# Patient Record
Sex: Female | Born: 1955 | Race: White | Hispanic: No | Marital: Married | Smoking: Former smoker
Health system: Southern US, Community
[De-identification: ages and names within clinical notes are randomized; demographics above are authoritative.]

## PROBLEM LIST (undated history)

## (undated) DIAGNOSIS — L24A9 Irritant contact dermatitis due friction or contact with other specified body fluids: Secondary | ICD-10-CM

## (undated) DIAGNOSIS — D649 Anemia, unspecified: Secondary | ICD-10-CM

## (undated) DIAGNOSIS — Z9189 Other specified personal risk factors, not elsewhere classified: Secondary | ICD-10-CM

## (undated) DIAGNOSIS — T148XXA Other injury of unspecified body region, initial encounter: Secondary | ICD-10-CM

## (undated) DIAGNOSIS — C7951 Secondary malignant neoplasm of bone: Secondary | ICD-10-CM

## (undated) DIAGNOSIS — C50919 Malignant neoplasm of unspecified site of unspecified female breast: Secondary | ICD-10-CM

## (undated) DIAGNOSIS — G4730 Sleep apnea, unspecified: Secondary | ICD-10-CM

## (undated) DIAGNOSIS — I219 Acute myocardial infarction, unspecified: Secondary | ICD-10-CM

## (undated) DIAGNOSIS — I209 Angina pectoris, unspecified: Secondary | ICD-10-CM

## (undated) DIAGNOSIS — Z780 Asymptomatic menopausal state: Secondary | ICD-10-CM

## (undated) DIAGNOSIS — I10 Essential (primary) hypertension: Secondary | ICD-10-CM

## (undated) DIAGNOSIS — I890 Lymphedema, not elsewhere classified: Secondary | ICD-10-CM

## (undated) HISTORY — DX: Other specified personal risk factors, not elsewhere classified: Z91.89

## (undated) HISTORY — DX: Asymptomatic menopausal state: Z78.0

## (undated) HISTORY — DX: Other injury of unspecified body region, initial encounter: T14.8XXA

## (undated) HISTORY — DX: Irritant contact dermatitis due friction or contact with other specified body fluids: L24.A9

## (undated) HISTORY — PX: HERNIA REPAIR: SHX51

## (undated) HISTORY — DX: Essential (primary) hypertension: I10

## (undated) HISTORY — DX: Secondary malignant neoplasm of bone: C79.51

## (undated) HISTORY — DX: Acute myocardial infarction, unspecified: I21.9

## (undated) HISTORY — PX: CORONARY ANGIOPLASTY WITH STENT PLACEMENT: SHX49

## (undated) HISTORY — DX: Lymphedema, not elsewhere classified: I89.0

## (undated) HISTORY — DX: Malignant neoplasm of unspecified site of unspecified female breast: C50.919

---

## 2004-06-27 DIAGNOSIS — Z780 Asymptomatic menopausal state: Secondary | ICD-10-CM

## 2004-06-27 HISTORY — DX: Asymptomatic menopausal state: Z78.0

## 2005-06-27 DIAGNOSIS — Z789 Other specified health status: Secondary | ICD-10-CM

## 2005-06-27 HISTORY — DX: Other specified health status: Z78.9

## 2005-11-08 ENCOUNTER — Other Ambulatory Visit: Payer: Self-pay

## 2005-11-08 ENCOUNTER — Inpatient Hospital Stay: Payer: Self-pay | Admitting: Internal Medicine

## 2006-01-11 ENCOUNTER — Ambulatory Visit: Payer: Self-pay | Admitting: Internal Medicine

## 2006-01-27 ENCOUNTER — Encounter: Payer: Self-pay | Admitting: Internal Medicine

## 2006-02-25 ENCOUNTER — Encounter: Payer: Self-pay | Admitting: Internal Medicine

## 2006-03-15 ENCOUNTER — Ambulatory Visit: Payer: Self-pay | Admitting: Internal Medicine

## 2006-03-27 ENCOUNTER — Encounter: Payer: Self-pay | Admitting: Internal Medicine

## 2006-04-27 ENCOUNTER — Encounter: Payer: Self-pay | Admitting: Internal Medicine

## 2006-05-27 ENCOUNTER — Encounter: Payer: Self-pay | Admitting: Internal Medicine

## 2006-06-27 ENCOUNTER — Encounter: Payer: Self-pay | Admitting: Internal Medicine

## 2013-05-27 ENCOUNTER — Ambulatory Visit: Payer: Self-pay | Admitting: Internal Medicine

## 2013-06-21 ENCOUNTER — Inpatient Hospital Stay: Payer: Self-pay | Admitting: Internal Medicine

## 2013-06-21 LAB — COMPREHENSIVE METABOLIC PANEL
Alkaline Phosphatase: 66 U/L
Anion Gap: 9 (ref 7–16)
BUN: 16 mg/dL (ref 7–18)
Calcium, Total: 8.3 mg/dL — ABNORMAL LOW (ref 8.5–10.1)
Chloride: 99 mmol/L (ref 98–107)
EGFR (African American): 58 — ABNORMAL LOW
EGFR (Non-African Amer.): 50 — ABNORMAL LOW
Glucose: 168 mg/dL — ABNORMAL HIGH (ref 65–99)
Osmolality: 268 (ref 275–301)
SGOT(AST): 39 U/L — ABNORMAL HIGH (ref 15–37)
SGPT (ALT): 22 U/L (ref 12–78)
Sodium: 131 mmol/L — ABNORMAL LOW (ref 136–145)
Total Protein: 7.4 g/dL (ref 6.4–8.2)

## 2013-06-21 LAB — CBC
HCT: 36.4 % (ref 35.0–47.0)
HGB: 11.6 g/dL — ABNORMAL LOW (ref 12.0–16.0)
MCH: 24.4 pg — ABNORMAL LOW (ref 26.0–34.0)
MCHC: 31.9 g/dL — ABNORMAL LOW (ref 32.0–36.0)
RBC: 4.75 10*6/uL (ref 3.80–5.20)

## 2013-06-21 LAB — URINALYSIS, COMPLETE
Glucose,UR: 50 mg/dL (ref 0–75)
Nitrite: NEGATIVE
Ph: 5 (ref 4.5–8.0)
Protein: 100
RBC,UR: 9 /HPF (ref 0–5)
Squamous Epithelial: 5

## 2013-06-21 LAB — TROPONIN I: Troponin-I: <0.02 ng/mL

## 2013-06-21 LAB — CK-MB: CK-MB: <0.5 ng/mL — ABNORMAL LOW (ref 0.5–3.6)

## 2013-06-22 LAB — CBC WITH DIFFERENTIAL/PLATELET
Basophil %: 0.3 %
HGB: 11.8 g/dL — ABNORMAL LOW (ref 12.0–16.0)
Lymphocyte #: 1.3 10*3/uL (ref 1.0–3.6)
MCH: 24.4 pg — ABNORMAL LOW (ref 26.0–34.0)
MCV: 77 fL — ABNORMAL LOW (ref 80–100)
Monocyte %: 6.1 %
Neutrophil #: 12.8 10*3/uL — ABNORMAL HIGH (ref 1.4–6.5)
Neutrophil %: 84.7 %
Platelet: 248 10*3/uL (ref 150–440)
WBC: 15.1 10*3/uL — ABNORMAL HIGH (ref 3.6–11.0)

## 2013-06-22 LAB — BASIC METABOLIC PANEL
Anion Gap: 5 — ABNORMAL LOW (ref 7–16)
Calcium, Total: 8.9 mg/dL (ref 8.5–10.1)
Calcium, Total: 8.3 mg/dL — ABNORMAL LOW (ref 8.5–10.1)
Chloride: 103 mmol/L (ref 98–107)
Co2: 28 mmol/L (ref 21–32)
Creatinine: 0.78 mg/dL (ref 0.60–1.30)
EGFR (Non-African Amer.): >60
Glucose: 120 mg/dL — ABNORMAL HIGH (ref 65–99)
Osmolality: 274 (ref 275–301)
Potassium: 3 mmol/L — ABNORMAL LOW (ref 3.5–5.1)
Sodium: 135 mmol/L — ABNORMAL LOW (ref 136–145)

## 2013-06-23 LAB — CBC WITH DIFFERENTIAL/PLATELET
Basophil #: 0 10*3/uL (ref 0.0–0.1)
Eosinophil %: 2.1 %
HCT: 33.9 % — ABNORMAL LOW (ref 35.0–47.0)
Lymphocyte %: 14.2 %
MCH: 24.4 pg — ABNORMAL LOW (ref 26.0–34.0)
MCHC: 31.9 g/dL — ABNORMAL LOW (ref 32.0–36.0)
Monocyte #: 1 x10 3/mm — ABNORMAL HIGH (ref 0.2–0.9)
Neutrophil #: 9.4 10*3/uL — ABNORMAL HIGH (ref 1.4–6.5)
Neutrophil %: 75.5 %
Platelet: 244 10*3/uL (ref 150–440)
RDW: 18.4 % — ABNORMAL HIGH (ref 11.5–14.5)

## 2013-06-23 LAB — BASIC METABOLIC PANEL
Anion Gap: 6 — ABNORMAL LOW (ref 7–16)
BUN: 7 mg/dL (ref 7–18)
Calcium, Total: 8.4 mg/dL — ABNORMAL LOW (ref 8.5–10.1)
Co2: 27 mmol/L (ref 21–32)
Creatinine: 0.76 mg/dL (ref 0.60–1.30)
Osmolality: 276 (ref 275–301)
Sodium: 138 mmol/L (ref 136–145)

## 2013-06-24 LAB — CBC WITH DIFFERENTIAL/PLATELET
Basophil #: 0 10*3/uL (ref 0.0–0.1)
Basophil %: 0.5 %
Eosinophil #: 0.3 10*3/uL (ref 0.0–0.7)
HCT: 31.1 % — ABNORMAL LOW (ref 35.0–47.0)
Lymphocyte %: 24.7 %
MCH: 24.6 pg — ABNORMAL LOW (ref 26.0–34.0)
MCHC: 32.1 g/dL (ref 32.0–36.0)
MCV: 77 fL — ABNORMAL LOW (ref 80–100)
Monocyte %: 7.8 %
Neutrophil %: 64.1 %
RBC: 4.06 10*6/uL (ref 3.80–5.20)
WBC: 9.2 10*3/uL (ref 3.6–11.0)

## 2013-06-24 LAB — BASIC METABOLIC PANEL
Anion Gap: 6 — ABNORMAL LOW (ref 7–16)
BUN: 5 mg/dL — ABNORMAL LOW (ref 7–18)
Calcium, Total: 7.9 mg/dL — ABNORMAL LOW (ref 8.5–10.1)
Chloride: 106 mmol/L (ref 98–107)
Creatinine: 0.77 mg/dL (ref 0.60–1.30)
EGFR (African American): >60
Potassium: 3.1 mmol/L — ABNORMAL LOW (ref 3.5–5.1)

## 2013-06-24 LAB — VANCOMYCIN, TROUGH: Vancomycin, Trough: 8 ug/mL — ABNORMAL LOW (ref 10–20)

## 2013-06-25 LAB — BASIC METABOLIC PANEL
BUN: 5 mg/dL — ABNORMAL LOW (ref 7–18)
Chloride: 108 mmol/L — ABNORMAL HIGH (ref 98–107)
Co2: 28 mmol/L (ref 21–32)
Creatinine: 0.66 mg/dL (ref 0.60–1.30)
EGFR (Non-African Amer.): >60
Osmolality: 278 (ref 275–301)
Potassium: 3.4 mmol/L — ABNORMAL LOW (ref 3.5–5.1)
Sodium: 140 mmol/L (ref 136–145)

## 2013-06-26 LAB — CBC WITH DIFFERENTIAL/PLATELET
Basophil #: 0.1 10*3/uL (ref 0.0–0.1)
Basophil %: 0.6 %
Eosinophil #: 0.3 10*3/uL (ref 0.0–0.7)
Eosinophil %: 2.9 %
HCT: 29.3 % — ABNORMAL LOW (ref 35.0–47.0)
HGB: 9.1 g/dL — ABNORMAL LOW (ref 12.0–16.0)
Lymphocyte #: 1.6 10*3/uL (ref 1.0–3.6)
Lymphocyte %: 14.8 %
MCH: 23.9 pg — ABNORMAL LOW (ref 26.0–34.0)
MCHC: 31.1 g/dL — ABNORMAL LOW (ref 32.0–36.0)
MCV: 77 fL — ABNORMAL LOW (ref 80–100)
Monocyte #: 0.6 x10 3/mm (ref 0.2–0.9)
Monocyte %: 5.7 %
Neutrophil #: 8.4 10*3/uL — ABNORMAL HIGH (ref 1.4–6.5)
Neutrophil %: 76 %
Platelet: 250 10*3/uL (ref 150–440)
RBC: 3.82 10*6/uL (ref 3.80–5.20)
RDW: 18.1 % — ABNORMAL HIGH (ref 11.5–14.5)
WBC: 11 10*3/uL (ref 3.6–11.0)

## 2013-06-26 LAB — BASIC METABOLIC PANEL
BUN: 7 mg/dL (ref 7–18)
Calcium, Total: 8.2 mg/dL — ABNORMAL LOW (ref 8.5–10.1)
Co2: 28 mmol/L (ref 21–32)
Creatinine: 0.74 mg/dL (ref 0.60–1.30)
EGFR (African American): >60
EGFR (Non-African Amer.): >60
Glucose: 117 mg/dL — ABNORMAL HIGH (ref 65–99)
Osmolality: 290 (ref 275–301)
Sodium: 146 mmol/L — ABNORMAL HIGH (ref 136–145)

## 2013-06-26 LAB — CULTURE, BLOOD (SINGLE)

## 2013-06-28 LAB — COMPREHENSIVE METABOLIC PANEL
Albumin: 2.1 g/dL — ABNORMAL LOW (ref 3.4–5.0)
Alkaline Phosphatase: 43 U/L — ABNORMAL LOW
Anion Gap: 6 — ABNORMAL LOW (ref 7–16)
BILIRUBIN TOTAL: 0.3 mg/dL (ref 0.2–1.0)
BUN: 6 mg/dL — ABNORMAL LOW (ref 7–18)
CHLORIDE: 111 mmol/L — AB (ref 98–107)
CREATININE: 0.8 mg/dL (ref 0.60–1.30)
Calcium, Total: 8.3 mg/dL — ABNORMAL LOW (ref 8.5–10.1)
Co2: 27 mmol/L (ref 21–32)
EGFR (African American): >60
GLUCOSE: 118 mg/dL — AB (ref 65–99)
Osmolality: 286 (ref 275–301)
POTASSIUM: 3.8 mmol/L (ref 3.5–5.1)
SGOT(AST): 21 U/L (ref 15–37)
SGPT (ALT): 23 U/L (ref 12–78)
Sodium: 144 mmol/L (ref 136–145)
Total Protein: 6.4 g/dL (ref 6.4–8.2)

## 2013-06-28 LAB — CBC WITH DIFFERENTIAL/PLATELET
BASOS PCT: 0.6 %
Basophil #: 0.1 10*3/uL (ref 0.0–0.1)
EOS ABS: 0.3 10*3/uL (ref 0.0–0.7)
Eosinophil %: 3.4 %
HCT: 29.6 % — ABNORMAL LOW (ref 35.0–47.0)
HGB: 9.3 g/dL — AB (ref 12.0–16.0)
Lymphocyte #: 1.4 10*3/uL (ref 1.0–3.6)
Lymphocyte %: 14.4 %
MCH: 24.3 pg — ABNORMAL LOW (ref 26.0–34.0)
MCHC: 31.6 g/dL — AB (ref 32.0–36.0)
MCV: 77 fL — AB (ref 80–100)
Monocyte #: 0.7 x10 3/mm (ref 0.2–0.9)
Monocyte %: 7.2 %
Neutrophil #: 7.2 10*3/uL — ABNORMAL HIGH (ref 1.4–6.5)
Neutrophil %: 74.4 %
PLATELETS: 285 10*3/uL (ref 150–440)
RBC: 3.85 10*6/uL (ref 3.80–5.20)
RDW: 18.9 % — AB (ref 11.5–14.5)
WBC: 9.7 10*3/uL (ref 3.6–11.0)

## 2013-06-28 LAB — VANCOMYCIN, TROUGH: Vancomycin, Trough: 22 ug/mL (ref 10–20)

## 2013-07-04 LAB — PATHOLOGY REPORT

## 2013-07-08 ENCOUNTER — Ambulatory Visit: Payer: Self-pay | Admitting: Internal Medicine

## 2013-07-08 LAB — CBC CANCER CENTER
BASOS ABS: 0.1 x10 3/mm (ref 0.0–0.1)
BASOS PCT: 1.4 %
EOS PCT: 1.3 %
Eosinophil #: 0.1 x10 3/mm (ref 0.0–0.7)
HCT: 35.5 % (ref 35.0–47.0)
HGB: 11.3 g/dL — AB (ref 12.0–16.0)
Lymphocyte #: 1.3 x10 3/mm (ref 1.0–3.6)
Lymphocyte %: 13.2 %
MCH: 24.5 pg — ABNORMAL LOW (ref 26.0–34.0)
MCHC: 32 g/dL (ref 32.0–36.0)
MCV: 76 fL — ABNORMAL LOW (ref 80–100)
MONOS PCT: 5.9 %
Monocyte #: 0.6 x10 3/mm (ref 0.2–0.9)
NEUTROS ABS: 7.6 x10 3/mm — AB (ref 1.4–6.5)
Neutrophil %: 78.2 %
Platelet: 416 x10 3/mm (ref 150–440)
RBC: 4.64 10*6/uL (ref 3.80–5.20)
RDW: 19.1 % — AB (ref 11.5–14.5)
WBC: 9.7 x10 3/mm (ref 3.6–11.0)

## 2013-07-08 LAB — IRON AND TIBC
IRON: 26 ug/dL — AB (ref 50–170)
Iron Bind.Cap.(Total): 306 ug/dL (ref 250–450)
Iron Saturation: 8 %
Unbound Iron-Bind.Cap.: 280 ug/dL

## 2013-07-08 LAB — FERRITIN: Ferritin (ARMC): 53 ng/mL (ref 8–388)

## 2013-07-09 LAB — CEA: CEA: 124.1 ng/mL — AB (ref 0.0–4.7)

## 2013-07-09 LAB — CANCER ANTIGEN 27.29: CA 27.29: 50.9 U/mL — AB (ref 0.0–38.6)

## 2013-07-28 ENCOUNTER — Ambulatory Visit: Payer: Self-pay | Admitting: Internal Medicine

## 2013-08-05 ENCOUNTER — Encounter: Payer: Self-pay | Admitting: Surgery

## 2013-08-25 ENCOUNTER — Encounter: Payer: Self-pay | Admitting: Surgery

## 2013-08-25 ENCOUNTER — Ambulatory Visit: Payer: Self-pay | Admitting: Internal Medicine

## 2013-09-04 LAB — CALCIUM: Calcium, Total: 9.4 mg/dL (ref 8.5–10.1)

## 2013-09-04 LAB — CREATININE, SERUM: CREATININE: 0.85 mg/dL (ref 0.60–1.30)

## 2013-09-04 LAB — PHOSPHORUS: Phosphorus: 2.8 mg/dL (ref 2.5–4.9)

## 2013-09-05 LAB — CEA: CEA: 72.4 ng/mL — AB (ref 0.0–4.7)

## 2013-09-25 ENCOUNTER — Ambulatory Visit: Payer: Self-pay | Admitting: Internal Medicine

## 2013-09-25 ENCOUNTER — Encounter: Payer: Self-pay | Admitting: Surgery

## 2013-10-02 LAB — CALCIUM: Calcium, Total: 8.8 mg/dL (ref 8.5–10.1)

## 2013-10-02 LAB — PHOSPHORUS: PHOSPHORUS: 1.9 mg/dL — AB (ref 2.5–4.9)

## 2013-10-11 LAB — COMPREHENSIVE METABOLIC PANEL
ALBUMIN: 3.3 g/dL — AB (ref 3.4–5.0)
ALT: 23 U/L (ref 12–78)
AST: 12 U/L — AB (ref 15–37)
Alkaline Phosphatase: 67 U/L
Anion Gap: 12 (ref 7–16)
BUN: 14 mg/dL (ref 7–18)
Bilirubin,Total: 0.2 mg/dL (ref 0.2–1.0)
CREATININE: 0.8 mg/dL (ref 0.60–1.30)
Calcium, Total: 8.9 mg/dL (ref 8.5–10.1)
Chloride: 105 mmol/L (ref 98–107)
Co2: 26 mmol/L (ref 21–32)
EGFR (African American): >60
EGFR (Non-African Amer.): >60
Glucose: 133 mg/dL — ABNORMAL HIGH (ref 65–99)
Osmolality: 287 (ref 275–301)
Potassium: 3.9 mmol/L (ref 3.5–5.1)
SODIUM: 143 mmol/L (ref 136–145)
Total Protein: 8.4 g/dL — ABNORMAL HIGH (ref 6.4–8.2)

## 2013-10-11 LAB — PROTIME-INR
INR: 1
Prothrombin Time: 12.6 secs (ref 11.5–14.7)

## 2013-10-11 LAB — CBC CANCER CENTER
BASOS ABS: 0.1 x10 3/mm (ref 0.0–0.1)
Basophil %: 1 %
EOS PCT: 3.2 %
Eosinophil #: 0.3 x10 3/mm (ref 0.0–0.7)
HCT: 42.2 % (ref 35.0–47.0)
HGB: 12.9 g/dL (ref 12.0–16.0)
LYMPHS ABS: 1.6 x10 3/mm (ref 1.0–3.6)
Lymphocyte %: 18.8 %
MCH: 23.6 pg — AB (ref 26.0–34.0)
MCHC: 30.6 g/dL — ABNORMAL LOW (ref 32.0–36.0)
MCV: 77 fL — AB (ref 80–100)
MONOS PCT: 5.7 %
Monocyte #: 0.5 x10 3/mm (ref 0.2–0.9)
Neutrophil #: 6.1 x10 3/mm (ref 1.4–6.5)
Neutrophil %: 71.3 %
PLATELETS: 285 x10 3/mm (ref 150–440)
RBC: 5.46 10*6/uL — ABNORMAL HIGH (ref 3.80–5.20)
RDW: 18.7 % — ABNORMAL HIGH (ref 11.5–14.5)
WBC: 8.6 x10 3/mm (ref 3.6–11.0)

## 2013-10-11 LAB — MAGNESIUM: Magnesium: 2.1 mg/dL

## 2013-10-14 LAB — CEA: CEA: 19.1 ng/mL — ABNORMAL HIGH (ref 0.0–4.7)

## 2013-10-17 LAB — CBC CANCER CENTER
BASOS PCT: 0.1 %
Basophil #: 0 x10 3/mm (ref 0.0–0.1)
EOS ABS: 0.4 x10 3/mm (ref 0.0–0.7)
Eosinophil %: 5.7 %
HCT: 39.7 % (ref 35.0–47.0)
HGB: 12.3 g/dL (ref 12.0–16.0)
LYMPHS ABS: 1.9 x10 3/mm (ref 1.0–3.6)
LYMPHS PCT: 24.6 %
MCH: 24.1 pg — ABNORMAL LOW (ref 26.0–34.0)
MCHC: 31.1 g/dL — ABNORMAL LOW (ref 32.0–36.0)
MCV: 78 fL — ABNORMAL LOW (ref 80–100)
MONO ABS: 0.5 x10 3/mm (ref 0.2–0.9)
Monocyte %: 6.2 %
Neutrophil #: 4.9 x10 3/mm (ref 1.4–6.5)
Neutrophil %: 63.4 %
Platelet: 278 x10 3/mm (ref 150–440)
RBC: 5.11 10*6/uL (ref 3.80–5.20)
RDW: 19.3 % — ABNORMAL HIGH (ref 11.5–14.5)
WBC: 7.8 x10 3/mm (ref 3.6–11.0)

## 2013-10-24 LAB — CBC CANCER CENTER
BASOS ABS: 0.1 x10 3/mm (ref 0.0–0.1)
Basophil %: 1.3 %
EOS PCT: 3.4 %
Eosinophil #: 0.2 x10 3/mm (ref 0.0–0.7)
HCT: 38.4 % (ref 35.0–47.0)
HGB: 12.4 g/dL (ref 12.0–16.0)
LYMPHS ABS: 1.5 x10 3/mm (ref 1.0–3.6)
Lymphocyte %: 27.7 %
MCH: 24.8 pg — ABNORMAL LOW (ref 26.0–34.0)
MCHC: 32.2 g/dL (ref 32.0–36.0)
MCV: 77 fL — ABNORMAL LOW (ref 80–100)
Monocyte #: 0.2 x10 3/mm (ref 0.2–0.9)
Monocyte %: 4.3 %
NEUTROS ABS: 3.5 x10 3/mm (ref 1.4–6.5)
NEUTROS PCT: 63.3 %
Platelet: 291 x10 3/mm (ref 150–440)
RBC: 5 10*6/uL (ref 3.80–5.20)
RDW: 19.4 % — ABNORMAL HIGH (ref 11.5–14.5)
WBC: 5.6 x10 3/mm (ref 3.6–11.0)

## 2013-10-25 ENCOUNTER — Ambulatory Visit: Payer: Self-pay | Admitting: Internal Medicine

## 2013-10-25 ENCOUNTER — Encounter: Payer: Self-pay | Admitting: Surgery

## 2013-10-31 LAB — CBC CANCER CENTER
BASOS ABS: 0.1 x10 3/mm (ref 0.0–0.1)
BASOS PCT: 1.8 %
EOS ABS: 0.1 x10 3/mm (ref 0.0–0.7)
EOS PCT: 3 %
HCT: 38.3 % (ref 35.0–47.0)
HGB: 12.1 g/dL (ref 12.0–16.0)
Lymphocyte #: 1.6 x10 3/mm (ref 1.0–3.6)
Lymphocyte %: 37.4 %
MCH: 25.2 pg — ABNORMAL LOW (ref 26.0–34.0)
MCHC: 31.5 g/dL — AB (ref 32.0–36.0)
MCV: 80 fL (ref 80–100)
Monocyte #: 0.2 x10 3/mm (ref 0.2–0.9)
Monocyte %: 5.5 %
NEUTROS ABS: 2.2 x10 3/mm (ref 1.4–6.5)
NEUTROS PCT: 52.3 %
Platelet: 224 x10 3/mm (ref 150–440)
RBC: 4.8 10*6/uL (ref 3.80–5.20)
RDW: 20 % — ABNORMAL HIGH (ref 11.5–14.5)
WBC: 4.3 x10 3/mm (ref 3.6–11.0)

## 2013-11-07 LAB — CBC CANCER CENTER
BASOS PCT: 1.4 %
Basophil #: 0.1 x10 3/mm (ref 0.0–0.1)
EOS ABS: 0.1 x10 3/mm (ref 0.0–0.7)
EOS PCT: 2.2 %
HCT: 37 % (ref 35.0–47.0)
HGB: 12 g/dL (ref 12.0–16.0)
LYMPHS ABS: 1.4 x10 3/mm (ref 1.0–3.6)
Lymphocyte %: 30.7 %
MCH: 25.9 pg — ABNORMAL LOW (ref 26.0–34.0)
MCHC: 32.5 g/dL (ref 32.0–36.0)
MCV: 80 fL (ref 80–100)
MONOS PCT: 9.2 %
Monocyte #: 0.4 x10 3/mm (ref 0.2–0.9)
Neutrophil #: 2.7 x10 3/mm (ref 1.4–6.5)
Neutrophil %: 56.5 %
PLATELETS: 205 x10 3/mm (ref 150–440)
RBC: 4.65 10*6/uL (ref 3.80–5.20)
RDW: 22.5 % — ABNORMAL HIGH (ref 11.5–14.5)
WBC: 4.7 x10 3/mm (ref 3.6–11.0)

## 2013-11-07 LAB — CREATININE, SERUM
Creatinine: 0.73 mg/dL (ref 0.60–1.30)
EGFR (African American): >60

## 2013-11-07 LAB — PHOSPHORUS: Phosphorus: 3 mg/dL (ref 2.5–4.9)

## 2013-11-07 LAB — CALCIUM: CALCIUM: 8.6 mg/dL (ref 8.5–10.1)

## 2013-11-08 LAB — CEA: CEA: 10.9 ng/mL — AB (ref 0.0–4.7)

## 2013-11-14 LAB — COMPREHENSIVE METABOLIC PANEL
ANION GAP: 10 (ref 7–16)
AST: 27 U/L (ref 15–37)
Albumin: 3 g/dL — ABNORMAL LOW (ref 3.4–5.0)
Alkaline Phosphatase: 67 U/L
BILIRUBIN TOTAL: 0.2 mg/dL (ref 0.2–1.0)
BUN: 14 mg/dL (ref 7–18)
CREATININE: 0.9 mg/dL (ref 0.60–1.30)
Calcium, Total: 8.2 mg/dL — ABNORMAL LOW (ref 8.5–10.1)
Chloride: 107 mmol/L (ref 98–107)
Co2: 27 mmol/L (ref 21–32)
EGFR (Non-African Amer.): >60
Glucose: 140 mg/dL — ABNORMAL HIGH (ref 65–99)
Osmolality: 290 (ref 275–301)
Potassium: 3.9 mmol/L (ref 3.5–5.1)
SGPT (ALT): 45 U/L (ref 12–78)
Sodium: 144 mmol/L (ref 136–145)
Total Protein: 7.4 g/dL (ref 6.4–8.2)

## 2013-11-14 LAB — CBC CANCER CENTER
Basophil #: 0.1 x10 3/mm (ref 0.0–0.1)
Basophil %: 2.3 %
Eosinophil #: 0.1 x10 3/mm (ref 0.0–0.7)
Eosinophil %: 2.3 %
HCT: 37.4 % (ref 35.0–47.0)
HGB: 12.1 g/dL (ref 12.0–16.0)
LYMPHS ABS: 1.5 x10 3/mm (ref 1.0–3.6)
Lymphocyte %: 24.1 %
MCH: 25.8 pg — AB (ref 26.0–34.0)
MCHC: 32.3 g/dL (ref 32.0–36.0)
MCV: 80 fL (ref 80–100)
Monocyte #: 0.4 x10 3/mm (ref 0.2–0.9)
Monocyte %: 5.8 %
NEUTROS ABS: 3.9 x10 3/mm (ref 1.4–6.5)
Neutrophil %: 65.5 %
Platelet: 281 x10 3/mm (ref 150–440)
RBC: 4.68 10*6/uL (ref 3.80–5.20)
RDW: 23.7 % — ABNORMAL HIGH (ref 11.5–14.5)
WBC: 6 x10 3/mm (ref 3.6–11.0)

## 2013-11-21 LAB — CBC CANCER CENTER
Basophil #: 0.2 x10 3/mm — ABNORMAL HIGH (ref 0.0–0.1)
Basophil %: 2.6 %
Eosinophil #: 0.2 x10 3/mm (ref 0.0–0.7)
Eosinophil %: 3.4 %
HCT: 38.2 % (ref 35.0–47.0)
HGB: 12.4 g/dL (ref 12.0–16.0)
Lymphocyte #: 1.6 x10 3/mm (ref 1.0–3.6)
Lymphocyte %: 27 %
MCH: 26.9 pg (ref 26.0–34.0)
MCHC: 32.5 g/dL (ref 32.0–36.0)
MCV: 83 fL (ref 80–100)
MONO ABS: 0.4 x10 3/mm (ref 0.2–0.9)
MONOS PCT: 6.1 %
NEUTROS ABS: 3.5 x10 3/mm (ref 1.4–6.5)
Neutrophil %: 60.9 %
PLATELETS: 367 x10 3/mm (ref 150–440)
RBC: 4.61 10*6/uL (ref 3.80–5.20)
RDW: 24.4 % — ABNORMAL HIGH (ref 11.5–14.5)
WBC: 5.8 x10 3/mm (ref 3.6–11.0)

## 2013-11-25 ENCOUNTER — Ambulatory Visit: Payer: Self-pay | Admitting: Internal Medicine

## 2013-11-25 ENCOUNTER — Encounter: Payer: Self-pay | Admitting: Surgery

## 2013-11-28 LAB — CBC CANCER CENTER
Basophil #: 0.1 x10 3/mm (ref 0.0–0.1)
Basophil %: 1.8 %
EOS ABS: 0.2 x10 3/mm (ref 0.0–0.7)
Eosinophil %: 3.5 %
HCT: 39.8 % (ref 35.0–47.0)
HGB: 12.7 g/dL (ref 12.0–16.0)
LYMPHS PCT: 31.2 %
Lymphocyte #: 1.9 x10 3/mm (ref 1.0–3.6)
MCH: 26.8 pg (ref 26.0–34.0)
MCHC: 31.8 g/dL — ABNORMAL LOW (ref 32.0–36.0)
MCV: 84 fL (ref 80–100)
MONO ABS: 0.3 x10 3/mm (ref 0.2–0.9)
MONOS PCT: 4.5 %
NEUTROS PCT: 59 %
Neutrophil #: 3.6 x10 3/mm (ref 1.4–6.5)
PLATELETS: 291 x10 3/mm (ref 150–440)
RBC: 4.72 10*6/uL (ref 3.80–5.20)
RDW: 24.7 % — ABNORMAL HIGH (ref 11.5–14.5)
WBC: 6.2 x10 3/mm (ref 3.6–11.0)

## 2013-12-05 LAB — CBC CANCER CENTER
BASOS ABS: 0.2 x10 3/mm — AB (ref 0.0–0.1)
BASOS PCT: 2.5 %
EOS PCT: 3 %
Eosinophil #: 0.2 x10 3/mm (ref 0.0–0.7)
HCT: 41 % (ref 35.0–47.0)
HGB: 13.1 g/dL (ref 12.0–16.0)
LYMPHS ABS: 2.1 x10 3/mm (ref 1.0–3.6)
Lymphocyte %: 32.1 %
MCH: 27.4 pg (ref 26.0–34.0)
MCHC: 32 g/dL (ref 32.0–36.0)
MCV: 85 fL (ref 80–100)
Monocyte #: 0.7 x10 3/mm (ref 0.2–0.9)
Monocyte %: 10.3 %
NEUTROS ABS: 3.3 x10 3/mm (ref 1.4–6.5)
Neutrophil %: 52.1 %
Platelet: 222 x10 3/mm (ref 150–440)
RBC: 4.79 10*6/uL (ref 3.80–5.20)
RDW: 25.6 % — ABNORMAL HIGH (ref 11.5–14.5)
WBC: 6.4 x10 3/mm (ref 3.6–11.0)

## 2013-12-05 LAB — PHOSPHORUS: PHOSPHORUS: 4.2 mg/dL (ref 2.5–4.9)

## 2013-12-05 LAB — CREATININE, SERUM: CREATININE: 0.91 mg/dL (ref 0.60–1.30)

## 2013-12-05 LAB — CALCIUM: Calcium, Total: 9.8 mg/dL (ref 8.5–10.1)

## 2013-12-06 LAB — CEA: CEA: 9.2 ng/mL — ABNORMAL HIGH (ref 0.0–4.7)

## 2013-12-19 LAB — HEPATIC FUNCTION PANEL A (ARMC)
ALBUMIN: 3.1 g/dL — AB (ref 3.4–5.0)
ALK PHOS: 67 U/L
ALT: 177 U/L — AB (ref 12–78)
AST: 65 U/L — AB (ref 15–37)
BILIRUBIN TOTAL: 0.2 mg/dL (ref 0.2–1.0)
Total Protein: 7.9 g/dL (ref 6.4–8.2)

## 2013-12-19 LAB — CREATININE, SERUM
CREATININE: 0.91 mg/dL (ref 0.60–1.30)
EGFR (African American): >60

## 2013-12-19 LAB — CBC CANCER CENTER
Basophil #: 0.1 x10 3/mm (ref 0.0–0.1)
Basophil %: 2.3 %
EOS ABS: 0.3 x10 3/mm (ref 0.0–0.7)
Eosinophil %: 4.6 %
HCT: 41.1 % (ref 35.0–47.0)
HGB: 13 g/dL (ref 12.0–16.0)
Lymphocyte #: 1.8 x10 3/mm (ref 1.0–3.6)
Lymphocyte %: 30.7 %
MCH: 27.7 pg (ref 26.0–34.0)
MCHC: 31.6 g/dL — ABNORMAL LOW (ref 32.0–36.0)
MCV: 88 fL (ref 80–100)
MONO ABS: 0.4 x10 3/mm (ref 0.2–0.9)
Monocyte %: 7 %
NEUTROS ABS: 3.3 x10 3/mm (ref 1.4–6.5)
NEUTROS PCT: 55.4 %
PLATELETS: 370 x10 3/mm (ref 150–440)
RBC: 4.68 10*6/uL (ref 3.80–5.20)
RDW: 25.3 % — ABNORMAL HIGH (ref 11.5–14.5)
WBC: 6 x10 3/mm (ref 3.6–11.0)

## 2013-12-25 ENCOUNTER — Ambulatory Visit: Payer: Self-pay | Admitting: Internal Medicine

## 2014-01-02 LAB — CBC CANCER CENTER
BASOS PCT: 2.2 %
Basophil #: 0.1 x10 3/mm (ref 0.0–0.1)
Eosinophil #: 0.2 x10 3/mm (ref 0.0–0.7)
Eosinophil %: 2.9 %
HCT: 41.7 % (ref 35.0–47.0)
HGB: 13.5 g/dL (ref 12.0–16.0)
LYMPHS ABS: 1.7 x10 3/mm (ref 1.0–3.6)
LYMPHS PCT: 27.8 %
MCH: 29.5 pg (ref 26.0–34.0)
MCHC: 32.5 g/dL (ref 32.0–36.0)
MCV: 91 fL (ref 80–100)
Monocyte #: 0.5 x10 3/mm (ref 0.2–0.9)
Monocyte %: 8.7 %
NEUTROS ABS: 3.6 x10 3/mm (ref 1.4–6.5)
Neutrophil %: 58.4 %
Platelet: 214 x10 3/mm (ref 150–440)
RBC: 4.59 10*6/uL (ref 3.80–5.20)
RDW: 24 % — ABNORMAL HIGH (ref 11.5–14.5)
WBC: 6.1 x10 3/mm (ref 3.6–11.0)

## 2014-01-02 LAB — PHOSPHORUS: Phosphorus: 3.3 mg/dL (ref 2.5–4.9)

## 2014-01-02 LAB — CREATININE, SERUM
Creatinine: 0.85 mg/dL (ref 0.60–1.30)
EGFR (African American): >60
EGFR (Non-African Amer.): >60

## 2014-01-02 LAB — HEPATIC FUNCTION PANEL A (ARMC)
AST: 51 U/L — AB (ref 15–37)
Albumin: 3.4 g/dL (ref 3.4–5.0)
Alkaline Phosphatase: 60 U/L
Bilirubin,Total: 0.3 mg/dL (ref 0.2–1.0)
SGPT (ALT): 110 U/L — ABNORMAL HIGH (ref 12–78)
Total Protein: 8.1 g/dL (ref 6.4–8.2)

## 2014-01-02 LAB — CALCIUM: Calcium, Total: 9.3 mg/dL (ref 8.5–10.1)

## 2014-01-09 LAB — HEPATIC FUNCTION PANEL A (ARMC)
Albumin: 3.3 g/dL — ABNORMAL LOW (ref 3.4–5.0)
Alkaline Phosphatase: 55 U/L
Bilirubin, Direct: <0.1 mg/dL (ref 0.00–0.20)
Bilirubin,Total: 0.3 mg/dL (ref 0.2–1.0)
SGOT(AST): 56 U/L — ABNORMAL HIGH (ref 15–37)
SGPT (ALT): 104 U/L — ABNORMAL HIGH (ref 12–78)
TOTAL PROTEIN: 7.9 g/dL (ref 6.4–8.2)

## 2014-01-16 LAB — HEPATIC FUNCTION PANEL A (ARMC)
ALT: 66 U/L — AB
AST: 32 U/L (ref 15–37)
Albumin: 3.4 g/dL (ref 3.4–5.0)
Alkaline Phosphatase: 61 U/L
Bilirubin,Total: 0.2 mg/dL (ref 0.2–1.0)
TOTAL PROTEIN: 8 g/dL (ref 6.4–8.2)

## 2014-01-25 ENCOUNTER — Ambulatory Visit: Payer: Self-pay | Admitting: Internal Medicine

## 2014-01-30 LAB — HEPATIC FUNCTION PANEL A (ARMC)
ALBUMIN: 3.4 g/dL (ref 3.4–5.0)
Alkaline Phosphatase: 58 U/L
Bilirubin, Direct: <0.1 mg/dL (ref 0.00–0.20)
Bilirubin,Total: 0.3 mg/dL (ref 0.2–1.0)
SGOT(AST): 20 U/L (ref 15–37)
SGPT (ALT): 38 U/L
TOTAL PROTEIN: 7.9 g/dL (ref 6.4–8.2)

## 2014-01-30 LAB — CBC CANCER CENTER
BASOS ABS: 0 x10 3/mm (ref 0.0–0.1)
BASOS PCT: 0.4 %
EOS ABS: 0.4 x10 3/mm (ref 0.0–0.7)
Eosinophil %: 3.6 %
HCT: 42.5 % (ref 35.0–47.0)
HGB: 13.8 g/dL (ref 12.0–16.0)
Lymphocyte #: 2.3 x10 3/mm (ref 1.0–3.6)
Lymphocyte %: 21.3 %
MCH: 29.5 pg (ref 26.0–34.0)
MCHC: 32.5 g/dL (ref 32.0–36.0)
MCV: 91 fL (ref 80–100)
MONOS PCT: 7.1 %
Monocyte #: 0.8 x10 3/mm (ref 0.2–0.9)
NEUTROS ABS: 7.4 x10 3/mm — AB (ref 1.4–6.5)
Neutrophil %: 67.6 %
Platelet: 275 x10 3/mm (ref 150–440)
RBC: 4.68 10*6/uL (ref 3.80–5.20)
RDW: 19.4 % — ABNORMAL HIGH (ref 11.5–14.5)
WBC: 11 x10 3/mm (ref 3.6–11.0)

## 2014-02-01 LAB — CEA: CEA: 15.4 ng/mL — ABNORMAL HIGH (ref 0.0–4.7)

## 2014-02-12 LAB — CBC CANCER CENTER
Basophil #: 0.1 x10 3/mm (ref 0.0–0.1)
Basophil %: 1.3 %
Eosinophil #: 0.2 x10 3/mm (ref 0.0–0.7)
Eosinophil %: 3.6 %
HCT: 41.5 % (ref 35.0–47.0)
HGB: 13.3 g/dL (ref 12.0–16.0)
Lymphocyte #: 1.4 x10 3/mm (ref 1.0–3.6)
Lymphocyte %: 23.5 %
MCH: 29.4 pg (ref 26.0–34.0)
MCHC: 32.2 g/dL (ref 32.0–36.0)
MCV: 91 fL (ref 80–100)
MONOS PCT: 4.5 %
Monocyte #: 0.3 x10 3/mm (ref 0.2–0.9)
NEUTROS ABS: 4 x10 3/mm (ref 1.4–6.5)
Neutrophil %: 67.1 %
PLATELETS: 232 x10 3/mm (ref 150–440)
RBC: 4.54 10*6/uL (ref 3.80–5.20)
RDW: 17.9 % — ABNORMAL HIGH (ref 11.5–14.5)
WBC: 5.9 x10 3/mm (ref 3.6–11.0)

## 2014-02-12 LAB — HEPATIC FUNCTION PANEL A (ARMC)
ALK PHOS: 59 U/L
ALT: 92 U/L — AB
Albumin: 3 g/dL — ABNORMAL LOW (ref 3.4–5.0)
Bilirubin,Total: 0.3 mg/dL (ref 0.2–1.0)
SGOT(AST): 34 U/L (ref 15–37)
TOTAL PROTEIN: 7.5 g/dL (ref 6.4–8.2)

## 2014-02-18 LAB — HEPATIC FUNCTION PANEL A (ARMC)
ALT: 49 U/L
AST: 26 U/L (ref 15–37)
Albumin: 3 g/dL — ABNORMAL LOW (ref 3.4–5.0)
Alkaline Phosphatase: 60 U/L
Bilirubin, Direct: <0.1 mg/dL (ref 0.00–0.20)
Bilirubin,Total: 0.2 mg/dL (ref 0.2–1.0)
Total Protein: 7.5 g/dL (ref 6.4–8.2)

## 2014-02-25 ENCOUNTER — Ambulatory Visit: Payer: Self-pay | Admitting: Internal Medicine

## 2014-02-26 LAB — CBC CANCER CENTER
BASOS ABS: 0.1 x10 3/mm (ref 0.0–0.1)
Basophil %: 2.1 %
Eosinophil #: 0.1 x10 3/mm (ref 0.0–0.7)
Eosinophil %: 1.8 %
HCT: 43.7 % (ref 35.0–47.0)
HGB: 14 g/dL (ref 12.0–16.0)
Lymphocyte #: 1.4 x10 3/mm (ref 1.0–3.6)
Lymphocyte %: 35.5 %
MCH: 30 pg (ref 26.0–34.0)
MCHC: 32.1 g/dL (ref 32.0–36.0)
MCV: 93 fL (ref 80–100)
Monocyte #: 0.3 x10 3/mm (ref 0.2–0.9)
Monocyte %: 7.1 %
NEUTROS ABS: 2.2 x10 3/mm (ref 1.4–6.5)
NEUTROS PCT: 53.5 %
Platelet: 225 x10 3/mm (ref 150–440)
RBC: 4.68 10*6/uL (ref 3.80–5.20)
RDW: 18.3 % — ABNORMAL HIGH (ref 11.5–14.5)
WBC: 4 x10 3/mm (ref 3.6–11.0)

## 2014-02-26 LAB — HEPATIC FUNCTION PANEL A (ARMC)
ALBUMIN: 3.3 g/dL — AB (ref 3.4–5.0)
ALT: 40 U/L
Alkaline Phosphatase: 58 U/L
Bilirubin,Total: 0.4 mg/dL (ref 0.2–1.0)
SGOT(AST): 23 U/L (ref 15–37)
Total Protein: 7.8 g/dL (ref 6.4–8.2)

## 2014-02-26 LAB — CREATININE, SERUM
Creatinine: 0.97 mg/dL (ref 0.60–1.30)
EGFR (African American): >60
EGFR (Non-African Amer.): >60

## 2014-02-27 LAB — CEA: CEA: 31.4 ng/mL — ABNORMAL HIGH (ref 0.0–4.7)

## 2014-03-17 LAB — HEPATIC FUNCTION PANEL A (ARMC)
ALK PHOS: 57 U/L
Albumin: 3.4 g/dL (ref 3.4–5.0)
Bilirubin, Direct: <0.1 mg/dL (ref 0.00–0.20)
Bilirubin,Total: 0.4 mg/dL (ref 0.2–1.0)
SGOT(AST): 20 U/L (ref 15–37)
SGPT (ALT): 30 U/L
Total Protein: 7.8 g/dL (ref 6.4–8.2)

## 2014-03-17 LAB — CALCIUM: Calcium, Total: 8.6 mg/dL (ref 8.5–10.1)

## 2014-03-17 LAB — PHOSPHORUS: Phosphorus: 2.3 mg/dL — ABNORMAL LOW (ref 2.5–4.9)

## 2014-03-17 LAB — CREATININE, SERUM
Creatinine: 0.76 mg/dL (ref 0.60–1.30)
EGFR (African American): >60
EGFR (Non-African Amer.): >60

## 2014-03-21 LAB — CEA: CEA: 33.5 ng/mL — AB (ref 0.0–4.7)

## 2014-03-27 ENCOUNTER — Ambulatory Visit: Payer: Self-pay | Admitting: Internal Medicine

## 2014-04-03 LAB — HEPATIC FUNCTION PANEL A (ARMC)
ALBUMIN: 3.1 g/dL — AB (ref 3.4–5.0)
Alkaline Phosphatase: 60 U/L
Bilirubin, Direct: 0.1 mg/dL (ref 0.00–0.20)
Bilirubin,Total: 0.3 mg/dL (ref 0.2–1.0)
SGOT(AST): 26 U/L (ref 15–37)
SGPT (ALT): 61 U/L
TOTAL PROTEIN: 7.2 g/dL (ref 6.4–8.2)

## 2014-04-11 LAB — CBC CANCER CENTER
BASOS PCT: 2.1 %
Basophil #: 0.1 x10 3/mm (ref 0.0–0.1)
EOS PCT: 3.5 %
Eosinophil #: 0.1 x10 3/mm (ref 0.0–0.7)
HCT: 42.1 % (ref 35.0–47.0)
HGB: 13.7 g/dL (ref 12.0–16.0)
Lymphocyte #: 1.4 x10 3/mm (ref 1.0–3.6)
Lymphocyte %: 34.4 %
MCH: 29.9 pg (ref 26.0–34.0)
MCHC: 32.4 g/dL (ref 32.0–36.0)
MCV: 92 fL (ref 80–100)
Monocyte #: 0.2 x10 3/mm (ref 0.2–0.9)
Monocyte %: 5.9 %
NEUTROS PCT: 54.1 %
Neutrophil #: 2.3 x10 3/mm (ref 1.4–6.5)
Platelet: 210 x10 3/mm (ref 150–440)
RBC: 4.56 10*6/uL (ref 3.80–5.20)
RDW: 19.5 % — ABNORMAL HIGH (ref 11.5–14.5)
WBC: 4.2 x10 3/mm (ref 3.6–11.0)

## 2014-04-11 LAB — HEPATIC FUNCTION PANEL A (ARMC)
ALT: 37 U/L
Albumin: 3.2 g/dL — ABNORMAL LOW (ref 3.4–5.0)
Alkaline Phosphatase: 59 U/L
BILIRUBIN DIRECT: 0.1 mg/dL (ref 0.00–0.20)
Bilirubin,Total: 0.3 mg/dL (ref 0.2–1.0)
SGOT(AST): 16 U/L (ref 15–37)
Total Protein: 7.6 g/dL (ref 6.4–8.2)

## 2014-04-24 LAB — HEPATIC FUNCTION PANEL A (ARMC)
ALBUMIN: 3.3 g/dL — AB (ref 3.4–5.0)
Alkaline Phosphatase: 58 U/L
BILIRUBIN TOTAL: 0.3 mg/dL (ref 0.2–1.0)
Bilirubin, Direct: <0.1 mg/dL (ref 0.0–0.2)
SGOT(AST): 27 U/L (ref 15–37)
SGPT (ALT): 34 U/L
Total Protein: 7.8 g/dL (ref 6.4–8.2)

## 2014-04-24 LAB — CBC CANCER CENTER
BASOS PCT: 2.6 %
Basophil #: 0.1 x10 3/mm (ref 0.0–0.1)
Eosinophil #: 0.1 x10 3/mm (ref 0.0–0.7)
Eosinophil %: 1.5 %
HCT: 43.1 % (ref 35.0–47.0)
HGB: 13.7 g/dL (ref 12.0–16.0)
LYMPHS PCT: 33.2 %
Lymphocyte #: 1.7 x10 3/mm (ref 1.0–3.6)
MCH: 30 pg (ref 26.0–34.0)
MCHC: 31.9 g/dL — ABNORMAL LOW (ref 32.0–36.0)
MCV: 94 fL (ref 80–100)
MONOS PCT: 11.4 %
Monocyte #: 0.6 x10 3/mm (ref 0.2–0.9)
Neutrophil #: 2.6 x10 3/mm (ref 1.4–6.5)
Neutrophil %: 51.3 %
Platelet: 273 x10 3/mm (ref 150–440)
RBC: 4.58 10*6/uL (ref 3.80–5.20)
RDW: 21.1 % — ABNORMAL HIGH (ref 11.5–14.5)
WBC: 5.2 x10 3/mm (ref 3.6–11.0)

## 2014-04-25 LAB — CEA: CEA: 66.4 ng/mL — ABNORMAL HIGH (ref 0.0–4.7)

## 2014-04-27 ENCOUNTER — Ambulatory Visit: Payer: Self-pay | Admitting: Internal Medicine

## 2014-04-28 LAB — CREATININE, SERUM
CREATININE: 0.9 mg/dL (ref 0.60–1.30)
EGFR (Non-African Amer.): >60

## 2014-04-28 LAB — CALCIUM: Calcium, Total: 8.8 mg/dL (ref 8.5–10.1)

## 2014-04-28 LAB — PHOSPHORUS: PHOSPHORUS: 2.3 mg/dL — AB (ref 2.5–4.9)

## 2014-05-14 LAB — CEA: CEA: 73.7 ng/mL — ABNORMAL HIGH (ref 0.0–4.7)

## 2014-05-27 ENCOUNTER — Ambulatory Visit: Payer: Self-pay | Admitting: Internal Medicine

## 2014-06-05 LAB — PHOSPHORUS: Phosphorus: 2.9 mg/dL (ref 2.5–4.9)

## 2014-06-05 LAB — CBC CANCER CENTER
BASOS PCT: 1.5 %
Basophil #: 0.2 x10 3/mm — ABNORMAL HIGH (ref 0.0–0.1)
EOS ABS: 0.3 x10 3/mm (ref 0.0–0.7)
Eosinophil %: 3 %
HCT: 43.3 % (ref 35.0–47.0)
HGB: 14 g/dL (ref 12.0–16.0)
LYMPHS PCT: 17.2 %
Lymphocyte #: 1.8 x10 3/mm (ref 1.0–3.6)
MCH: 30.1 pg (ref 26.0–34.0)
MCHC: 32.3 g/dL (ref 32.0–36.0)
MCV: 93 fL (ref 80–100)
MONO ABS: 0.9 x10 3/mm (ref 0.2–0.9)
MONOS PCT: 8.1 %
NEUTROS ABS: 7.4 x10 3/mm — AB (ref 1.4–6.5)
Neutrophil %: 70.2 %
PLATELETS: 338 x10 3/mm (ref 150–440)
RBC: 4.66 10*6/uL (ref 3.80–5.20)
RDW: 19.1 % — AB (ref 11.5–14.5)
WBC: 10.6 x10 3/mm (ref 3.6–11.0)

## 2014-06-05 LAB — CALCIUM: CALCIUM: 9 mg/dL (ref 8.5–10.1)

## 2014-06-05 LAB — CREATININE, SERUM
CREATININE: 0.87 mg/dL (ref 0.60–1.30)
EGFR (African American): >60
EGFR (Non-African Amer.): >60

## 2014-06-06 LAB — CEA: CEA: 75.8 ng/mL — ABNORMAL HIGH (ref 0.0–4.7)

## 2014-06-27 ENCOUNTER — Ambulatory Visit: Payer: Self-pay | Admitting: Internal Medicine

## 2014-08-01 ENCOUNTER — Ambulatory Visit: Payer: Self-pay | Admitting: Internal Medicine

## 2014-08-01 LAB — COMPREHENSIVE METABOLIC PANEL
ALK PHOS: 59 U/L (ref 46–116)
ANION GAP: 12 (ref 7–16)
Albumin: 3.3 g/dL — ABNORMAL LOW (ref 3.4–5.0)
BILIRUBIN TOTAL: 0.3 mg/dL (ref 0.2–1.0)
BUN: 13 mg/dL (ref 7–18)
CO2: 27 mmol/L (ref 21–32)
Calcium, Total: 9 mg/dL (ref 8.5–10.1)
Chloride: 103 mmol/L (ref 98–107)
Creatinine: 0.89 mg/dL (ref 0.60–1.30)
EGFR (African American): >60
Glucose: 148 mg/dL — ABNORMAL HIGH (ref 65–99)
OSMOLALITY: 286 (ref 275–301)
Potassium: 3.5 mmol/L (ref 3.5–5.1)
SGOT(AST): 22 U/L (ref 15–37)
SGPT (ALT): 28 U/L (ref 14–63)
SODIUM: 142 mmol/L (ref 136–145)
Total Protein: 7.7 g/dL (ref 6.4–8.2)

## 2014-08-01 LAB — CANCER CENTER HEMOGLOBIN: HGB: 14 g/dL (ref 12.0–16.0)

## 2014-08-01 LAB — PHOSPHORUS: Phosphorus: 2.3 mg/dL — ABNORMAL LOW (ref 2.5–4.9)

## 2014-08-02 LAB — CEA: CEA: 91.8 ng/mL — ABNORMAL HIGH (ref 0.0–4.7)

## 2014-08-26 ENCOUNTER — Ambulatory Visit
Admit: 2014-08-26 | Disposition: A | Discharge status: Home / Self Care | Payer: Self-pay | Attending: Internal Medicine | Admitting: Internal Medicine

## 2014-09-26 ENCOUNTER — Ambulatory Visit
Admit: 2014-09-26 | Disposition: A | Discharge status: Home / Self Care | Payer: Self-pay | Attending: Internal Medicine | Admitting: Internal Medicine

## 2014-10-02 LAB — CBC CANCER CENTER
BASOS PCT: 1.5 %
Basophil #: 0.1 x10 3/mm (ref 0.0–0.1)
Eosinophil #: 0.3 x10 3/mm (ref 0.0–0.7)
Eosinophil %: 3.3 %
HCT: 42.8 % (ref 35.0–47.0)
HGB: 13.7 g/dL (ref 12.0–16.0)
Lymphocyte #: 1.5 x10 3/mm (ref 1.0–3.6)
Lymphocyte %: 19.4 %
MCH: 27.3 pg (ref 26.0–34.0)
MCHC: 32.1 g/dL (ref 32.0–36.0)
MCV: 85 fL (ref 80–100)
MONOS PCT: 6.1 %
Monocyte #: 0.5 x10 3/mm (ref 0.2–0.9)
NEUTROS ABS: 5.4 x10 3/mm (ref 1.4–6.5)
Neutrophil %: 69.7 %
Platelet: 257 x10 3/mm (ref 150–440)
RBC: 5.04 10*6/uL (ref 3.80–5.20)
RDW: 18.3 % — ABNORMAL HIGH (ref 11.5–14.5)
WBC: 7.8 x10 3/mm (ref 3.6–11.0)

## 2014-10-02 LAB — CALCIUM: CALCIUM: 8.6 mg/dL — AB

## 2014-10-02 LAB — HEPATIC FUNCTION PANEL A (ARMC)
Albumin: 3.7 g/dL
Alkaline Phosphatase: 41 U/L
Bilirubin, Direct: <0.1 mg/dL
Bilirubin,Total: 0.5 mg/dL
SGOT(AST): 21 U/L
SGPT (ALT): 23 U/L
Total Protein: 7.6 g/dL

## 2014-10-02 LAB — CREATININE, SERUM: CREATININE: 0.71 mg/dL

## 2014-10-03 LAB — CREATININE, SERUM: Creatine, Serum: 0.71

## 2014-10-03 LAB — CEA: CEA: 173.1 ng/mL — AB (ref 0.0–4.7)

## 2014-10-17 NOTE — Consult Note (Signed)
PATIENT NAME:  [name], [name] MR#:  [id number] DATE OF BIRTH:  [birth year]  DATE OF CONSULTATION:  06/24/2013  CONSULTING PHYSICIAN:  Loreli Dollar, MD  HISTORY OF PRESENT ILLNESS: This 59 year old female presents for a General Surgery consultation with a chief complaint of a mass of the right breast. She has had a sore of the right breast for about 4 years, and says she has been ignoring it. the sore has gradually gotten larger, and just last week developed marked swelling of her right arm, and also extensive redness of the right arm, and a temperature of 101. Also had a sore throat and came in, where she was evaluated by the Emergency Room staff, which did include CT scan of the chest and abdomen. She did have blood cultures, which were negative. Was started on intravenous Zosyn and vancomycin. Has also had a PICC line inserted in the left arm. The patient has been keeping her right arm propped up on a pillow. Says the redness has improved some since admission. Has had Oncology consultation with Dr. Inez Pilgrim, and there is a suspicion of breast cancer, and requested General Surgery consultation for biopsy.  It is further noted that it has been many years since her last mammogram. It is further noted that she does have a sore of the right breast. Has been managed with dressings and dressing changes due to drainage.   PAST MEDICAL HISTORY DOES INCLUDE:  1.  Morbid obesity.  2.  Coronary artery disease, with history of myocardial infarction in 2007. Did have 4 stents put in her heart. She reports no subsequent chest pains. Has been treated with Plavix and aspirin.  3.  Has a history of hypertension.  4.  Hyperlipidemia.   PREVIOUS SURGERY:  Includes the above-mentioned insertion of coronary stents in 2007. Also has a history of a hernia repair.   MEDICATIONS: Include Plavix 75 mg daily, aspirin 81 mg daily, Lovaza 1000 mg daily, simvastatin 80 mg daily, Toprol-XL 50 mg 2 times per day, Allegra 180 mg  daily.   DRUG ALLERGIES: None known.   FAMILY HISTORY: Negative for breast cancer. Positive for hypertension.   SOCIAL HISTORY: Does not smoke. Does not drink any alcohol. Is accompanied by her husband.   REVIEW OF SYSTEMS: She does report recent sore throat, which is better. She reports no dyspnea. No chest pain. Had some recent nausea and vomiting prior to admission, but subsequently has been able to resume solid meals since admission.   PHYSICAL EXAMINATION: GENERAL:  She is awake, alert and oriented. Height is 65 inches, weight is 332 pounds, with a BMI of 55.3.  VITAL SIGNS: Temperature 98.6, pulse 85, respirations 20, blood pressure 143/81, pulse ox 98%. HEENT:  Sclerae clear. Pharynx currently clear.  NECK: No palpable mass.  BREASTS:  There is a large, ulcerated mass of the upper outer aspect of the left breast. There is no nipple found. This ulcerated mass has an ulcer which is about 6 cm across, and is firm and tends to bleed easily. There was some bleeding as the dressing was changed. There is also an ulcerated area in the axilla, which is ulcerated area several centimeters in dimension. Has some serosanguineous drainage. These 2 dressings were changed, and applied a small amount of paper tape. There was no other palpable mass in the right breast. No palpable mass in the left breast. No left axillary adenopathy. Does have marked right axillary adenopathy. There is extensive redness of the right arm extending  from the upper arm down to the wrist. This is a very vivid, red color, particularly in her forearm, there is marked swelling associated with this redness. There is some small area of ulceration in the skin, with a small dressing intact of the outer aspect right upper arm.  LUNGS: Sounds are clear.  HEART: Regular rhythm, S1 and S2.  BACK:  There is a large area of slight bluish discoloration of the right back in a wide area. There is some palpable vague nodularity. There is firmness  just above and lateral to the scapula. There is a wide area of firmness inferior to the scapula. There are no areas of distinct redness or ulceration in this part of her back.  ABDOMEN: Very obese.  EXTREMITIES: With trace bilateral ankle edema. The patient is ambulatory. Does have a PICC line in the inner aspect of the left upper arm.   CLINICAL DATA:  Her blood cultures done on the 26th were negative. Her latest creatinine was 0.77, potassium 3.1. Her liver panel on admission with a low albumin of 2.4. Her white blood count on admission was 17,900, and today is 9200. Her hemoglobin is 10.0, with an MCV of 77.   CT scan reviewed demonstrated evidence of adenopathy in the right axilla, markedly swollen arm, large mass involving the left adrenal gland, and evidence of mass formation in the subscapular region of her back. I do not see any pulmonary nodules.   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   I discussed this with her at some length, and recommended Tru-Cut needle biopsy. I think it would be best to biopsy adjacent to the ulcerated mass of the breast, which can be best palpated. It would be best to be off the Plavix and aspirin for a week so we could possibly do the breast biopsy on January 2, possibly about 5:30 in the afternoon. I have discussed needle biopsy with her, risks and benefits. It would be best to be off the Plavix to avoid a problem of bleeding, and would need to stop her Lovenox so she gets the last dose the day before the procedure in the morning, so for more than 24 hours.  Will follow during the course of the week.   I also commented that if she does need to have chemotherapy, it might best be started with her PICC line for the first few weeks as, if a Port-A-Cath was inserted, there would be the risk of infection.   I  also discussed with her the potential role of hormonal therapy, potential role of chemotherapy.   Also would agree with current dressing changes.   I think, at this point, there is minimal role for surgery.     ____________________________ Lenna Sciara. Rochel Brome, MD jws:mr D: 06/24/2013 19:09:58 ET T: 06/24/2013 20:14:52 ET JOB#: [id number]  cc: Loreli Dollar, MD, <Dictator> Loreli Dollar MD ELECTRONICALLY SIGNED 06/26/2013 16:53

## 2014-10-17 NOTE — Consult Note (Signed)
Brief Consult Note: Diagnosis: BREAST CANCER METASTATIC LYMPH NODES, SKIN, ADRENAL, PATH PENDING, CELLULITIS.   Patient was seen by consultant.   Comments: SEE DICTATED NOTE TO FOLLOW  HX OF BREAST LESION FOR MANY MONTHS, ARM EDEMA FOR ONE WEEK, SUDDEN ONSET REDNESS DAY OF ADMISSION.  ALSO NOTE THAT CHRONIC MEDS HAVE INCLUDED ENALAPPRIL 5MG  QD, AND PLAVIX 75MG  DAILY, PATIENT HAS HX OF EASY BRUISING. EXAM REVEALS HARD SUBCUTANEOUS MASS UNDERLYING SCAPUILAE CORRESPONDING TO CT SCAN FINDINGS, ERYTHEMA WITH BLANCHING BUT NOT TENDER ARM, LUNGS CLEAR ABDO NOT TENDER. DOPPLER UPPER EXT NO CLOT, CT FINDINGS NOTED.  PLAN, WOULD HOLD AND CONSIDER D/C PLAVIX, CONSULT MEDICINE AND/OR CARDIOLOGY. CHECK ECHO FOR BASELINE, AND WOULD NEED PRE TX  IF HER 2 POS. WOULD GET PET/CT . WOULD CONSULT DR Tamala Julian FOR BX, LOOKS LIKE CORE NEEDLE BX OF SCAPULAR MASS WOULD BE DIAGNOSTIC  AND EASILY OBTAINED, NOTE HELD PLAVIX SINCE ADMISSION, NOTE LAST ASA WAS TODAY 12/28, CURRENTLY ON BID LOVENOX.  Electronic Signatures: Dallas Schimke (MD)  (Signed 28-Dec-14 15:53)  Authored: Brief Consult Note   Last Updated: 28-Dec-14 15:53 by Dallas Schimke (MD)

## 2014-10-18 NOTE — H&P (Signed)
PATIENT NAME:  [name], CARTELLI MR#:  [id number] DATE OF BIRTH:  Oct 19, 1955  DATE OF ADMISSION:  06/21/2013  REFERRING PHYSICIAN: Dr. Joni Fears.   PRIMARY CARE PHYSICIAN: Dr. Frazier Richards.   CHIEF COMPLAINT: Redness and swelling of the right arm.   HISTORY OF PRESENT ILLNESS: [name] is a 59 year old, pleasant, white female with a past medical history of hypertension and hyperlipidemia. Has been having an ulcer with inverted nipple for a prolonged period of time. The ulcer also has been gradually getting worse, larger and deeper. Noticed to have purulent discharge from the wound. Noticed to have significant swelling and redness of the right breast and right arm. Noticed to have a fever of 101. Concerning this, came to the Emergency Department. Workup in the Emergency Department, the patient is noted to have WBC count of 18,000. She is also found to have a urinary tract infection with 2+ leukocyte esterase, 3+ bacteria, 61 WBCs. Blood cultures have been obtained. Received vancomycin and Zosyn in the Emergency Department. The patient complains of some pain in the right breast. The patient has been ignoring for a long time. The patient follows up with Dr. Ouida Sills. Last visit was 9 months back. The patient states has not discussed with Dr. Ouida Sills regarding the ulcer. Last mammogram was in 2007. For the last 2 days, the patient has had decreased appetite. Did not have any loss of weight. Denies having any shortness of breath.   PAST MEDICAL HISTORY:  1. Hypertension.  2. Hyperlipidemia.  3. Morbid obesity.   PAST SURGICAL HISTORY:  1. Stent placement in 2007.  2. Hernia repair.    ALLERGIES: No known drug allergies.   HOME MEDICATIONS:  1. Allegra 180 mg once a day.  2. Aspirin 81 mg daily.  3. Lovaza 1000 mg daily.  4. Toprol-XL 50 mg 2 times a day.  5. Simvastatin 80 mg daily.   SOCIAL HISTORY: No history of smoking, drinking alcohol or using illicit drugs. 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    FAMILY HISTORY: Hypertension.   REVIEW OF SYSTEMS:  CONSTITUTIONAL: Generalized weakness.  EYES: No change in vision.  ENT: No change in hearing. No sore throat.  RESPIRATORY: No cough, shortness of breath.  CARDIOVASCULAR: No chest pain. Has been experiencing palpations.  GASTROINTESTINAL: Has been experiencing nausea and vomiting for the last 2 days.  GENITOURINARY: No dysuria or hematuria.   ENDOCRINE: No polyuria or polydipsia.  HEMATOLOGIC: No easy bruising or bleeding.  SKIN: Redness and swelling and a large ulcer of the breast.   MUSCULOSKELETAL: No joint pains and aches.  NEUROLOGIC: No  numbness in any part of the body.  PSYCHIATRIC: Denies any depression, anxiety.   PHYSICAL EXAMINATION:  GENERAL: This is a well-built, well-nourished, morbidly obese female lying down in the bed, not in distress.  VITAL SIGNS: Temperature 101.2, pulse 110, blood pressure 110/50, respiratory rate of 18, oxygen saturation 93% on room air.  HEENT: Head normocephalic, atraumatic. There is no scleral icterus. Conjunctivae normal. Pupils equal and react to light. Extraocular movements are intact. Mucous membranes moist. No pharyngeal erythema.  NECK: Supple. No lymphadenopathy. No JVD. No carotid bruit.  CHEST: Has right breast large ulcer with absence of the nipple, possibly inverted. Significant redness of the right breast with purulent drainage from the wound. Extensive redness and swelling of the right breast as well as into the trunk and right arm. Significantly increased in size compared to the left hand. Hard palpable lymph nodes  of the axilla.   LUNGS: Bilaterally clear to auscultation.  HEART: S1, S2, regular, tachycardia. No murmurs are heard.  ABDOMEN: Obese. Bowel sounds present. Soft, nontender, nondistended.  EXTREMITIES: No pedal edema. Pulses 2+.  SKIN: As mentioned above. Has ulcer and redness and swelling.  MUSCULOSKELETAL: Good range of motion in all  of the extremities.  NEUROLOGIC: The patient is alert, oriented to place, person and time. Cranial nerves II through XII intact. Motor 5/5 in upper and lower extremities.   LABORATORIES: UA 2+ leukocyte esterase, WBCs 61, bacteria 3+.   Chest x-ray, PA and lateral: Mild hyperinflation, suggests COPD. Coarse interstitial lung markings, acute versus chronic.   CBC: WBC of, hemoglobin 11.6, platelet count of 248. CMP: BUN 16, creatinine of 1.22, sodium 131, potassium of 2.9.   ASSESSMENT AND PLAN: [name] is a 59 year old female who comes to the Emergency Department with cellulitis and infected ulcer of the right breast.   1. Sepsis secondary to cellulitis: The patient has fever of 101, elevated white blood cell count. This is covering large area of the body with significant sepsis. Treat with vancomycin and Zosyn. Obtain blood cultures. De-escalate the antibiotics once the culture data is available.  2. Cellulitis secondary to the large ulcer as the entry point with purulent drainage: Continue with antibiotics. Obtain cultures.  3. Urinary tract infection: These antibiotics should cover the urinary tract infection as well as.  4. Large ulcer of the breast with significant lymphadenopathy: Highly concerning about malignancy. The patient did not have any mammogram for the last  years. Will need to obtain biopsy of the breast. Will obtain CT chest with contrast.  5. Hypokalemia: Will replace by mouth.  6. Hypertension: Will hold the blood pressure medications for now concerning about the patient's sepsis.  7. History of coronary artery disease, status post stent placement: Continue with aspirin.  8. Morbid obesity: The obesity could be a risk factor for the possible breast cancer.  9. Keep the patient on deep vein thrombosis prophylaxis with Lovenox.   TIME SPENT: 55 minutes.    ____________________________ Monica Becton, MD pv:gb D: 06/21/2013 22:20:55 ET T: 06/21/2013 23:24:17  ET JOB#: [id number]  cc: Monica Becton, MD, <Dictator> Ocie Cornfield. Ouida Sills, MD Grier Mitts Ravon Mcilhenny MD ELECTRONICALLY SIGNED 07/05/2013 21:21

## 2014-10-18 NOTE — Op Note (Signed)
PATIENT NAME:  [name], [name] MR#:  [id number] DATE OF BIRTH:  1955/07/28  DATE OF PROCEDURE:  06/21/2013  PREOPERATIVE DIAGNOSIS: Right breast mass.   POSTOPERATIVE DIAGNOSIS: Right breast mass.   PROCEDURE: Core needle biopsy, right breast mass.   SURGEON: Rochel Brome, M.D.   ANESTHESIA: Local 1% Xylocaine with epinephrine.   DESCRIPTION OF PROCEDURE: With the patient in her hospital bed in the supine position, the dressing was removed from the right breast exposing a large, ulcerated mass, which was some 5 cm in diameter in the ulcerated area and the mass was significantly larger than that. There was a site identified anteriorly just anterior and medial to the mass, which was selected for biopsy. This area was prepared with Betadine solution and draped with sterile towels. The skin was infiltrated with 1% Xylocaine with epinephrine. A lancing incision was made with the 11 blade scalpel. Next, 4 core biopsies were done with a Tru-Cut biopsy needle and each specimen placed in the same formalin container. Pressure was held as there was some minimal oozing and fairly soon thereafter the bleeding stopped. Dressings were applied over the whole ulcerated mass with paper tape. The patient tolerated the procedure satisfactorily. These specimens were placed into the formalin and labeled and also filled out a requisition sheet and carried this directly to the histology lab. The patient tolerated procedure satisfactorily.   ____________________________ Lenna Sciara. Rochel Brome, MD jws:aw D: 06/28/2013 07:45:30 ET T: 06/28/2013 07:55:29 ET JOB#: [id number]  cc: Loreli Dollar, MD, <Dictator> Loreli Dollar MD ELECTRONICALLY SIGNED 07/04/2013 19:50

## 2014-10-18 NOTE — Consult Note (Signed)
PATIENT NAME:  [name], SMART MR#:  [id number] DATE OF BIRTH:  May 06, 1956  DATE OF CONSULTATION:  06/23/2013  CONSULTING PHYSICIAN:  Simonne Come. Inez Pilgrim, MD  HISTORY OF PRESENT ILLNESS:  [name] is a 59 year old patient who was admitted on  December 26, with cellulitis of the right arm and infected ulcer of the right breast with sepsis, also hypertension and hypokalemia. The patient was seen and evaluated by me on December 28, and a note placed on the chart at that time, and this full narrative for the events of that day delayed until currently. The patient who had an ulcer for a long time, initially stated  was a month later and told physicians that there was something on the breast for a few years that she ignored, was getting larger and there just started to be some discharge, so the patient made a decision she was going to seek medical attention after the holidays. However, she had a little bit swelling in the arm for a few days,  but woke suddenly the morning of admission with sudden redness and fever and increased swelling and came to the Emergency Room. She was found to have a leukocytosis, also urinary tract infection as well as obvious soft tissue infection. After blood and urine cultures, she was placed on vancomycin and Zosyn.  The patient's medications had included also enalapril and Plavix.  Both were held at the time of admission, but she was continued on her other regular medications.  In the interval since admission, the patient had some improvement, less tenderness, less pain and less redness in the arm, but still significantly abnormal.   PAST HISTORY: Also notable for hypertension, hyperlipidemia. The patient's last a mammogram in 2007. She has coronary artery disease and had a stent placed in 2007 and has been on Plavix and aspirin since. She has also had a hernia repair.   ALLERGIES:  No known allergies.   MEDICATIONS AT HOME:  At the time of admission were: Allegra 180 mg daily,  aspirin 81 mg daily, Lovaza 1000 mg daily, Toprol-XL 50 mg twice a day, simvastatin 80 mg daily, enalapril 5 mg daily, Plavix 75 mg daily.   SOCIAL HISTORY: No alcohol or tobacco.   FAMILY HISTORY: Negative for malignancy.   ADDITIONAL SYSTEM REVIEW: When seen, the patient had discomfort, but improved in the arm and the soft tissue issues and long-standing mass in the recent symptoms in the right arm as already above. She has had nausea the last few days had vomited once in the last day, but was not having abdominal pain. General weakness. Had noted on admission to physician of  palpitations, but did not give me any history of palpitations or chest pain. No the visual disturbances. No ear or jaw pain. No cough, wheezing or shortness of breath. No abdominal pain. No dysuria or hematuria. She does have a history of easy bruising that had been attributed to the Plavix and the aspirin. No recent excess bruises or changes. No joint pain or swelling. No lower extremity edema. No focal weakness. No history of anxiety or depression or seizure or stroke.   PHYSICAL EXAMINATION: GENERAL: Was alert and cooperative. Fever was better and normal oxygen saturation.  Sclerae were clear. No palpable lymphadenopathy in in the neck, supraclavicular or submandibular region.  The right breast has an ulcer with inverted nipple and some redness in the breast and extensive redness of the right arm and a brawny skin texture in the right arm. There  is adenopathy palpable in the axilla. There is increased edema in the right arm.  LUNGS: Clear. No wheezing or rales.  HEART: Regular.  ABDOMEN: Obese, but nontender. No palpable mass or organomegaly.  EXTREMITIES: No extremity edema.  NEUROLOGIC: Exam was grossly nonfocal although moving the right less due to  local discomfort.   LABORATORY, DIAGNOSTIC AND RADIOLOGIC DATA: Urine was abnormal with 60 white cells and 3+ bacteria and positive leukocyte esterase.   Chest x-ray  showed chronic obstructive pulmonary disease and some interstitial lung markings. There was leukocytosis with hemoglobin 11.6, platelets 248, creatinine 1.2. Potassium was 2.9 on admission. Hemoglobin was later, with hydration, at a 10.8, MCV was 77, potassium was supplemented.   CT of the chest showed diffuse skin thickening of breast and there was also scapular soft tissue mass in the region of the scapula and to normal cardiac chamber size. No pleural effusion, left adrenal mass or definite liver mass. There were also enlarged nodes in the porta hepatis and the splenic hilum. The white count was improved by December 28, still at 12.1. There were no tumor markers available.    IMPRESSION AND PLAN: The patient appears to have stage IV breast cancer with clinical evidence of primary breast mass, axillary adenopathy and appears to have soft tissue metastatic disease and also adrenal mass. The patient has cellulitis of the right arm, likely lymphedema as well. Currently, vital signs stable. She has underlying hypertension. Blood pressure currently okay. She has underlying coronary artery disease. Metoprolol had been held, which we recommend we get restarted. Plavix is held and will continue to hold that in anticipation of a biopsy. Aspirin is being given, but would recommend holding that in anticipation of a biopsy. The baseline cardiogram showed some abnormalities, but the patient clinically had no symptoms, this was later discussed with medicine and no work-up was indicated in the absence of any symptoms. There is some anemia that is compatible with malignancy. There was low MCV and could represent coexisting iron deficiency. No signs or symptoms of the history of gastrointestinal problems.   The patient is being followed by medicine and continues antibiotics appropriately. We will need a biopsy and had originally felt that likely site would be an accessible soft tissue area give tissue diagnosis and also  establish a pathologic proof of metastatic disease, but would defer site of biopsy to surgeon. Would continue to hold Plavix and would hold aspirin. Consult surgery. Also recommend a PET scan which, although the patient already appears to have stage IV disease, might identify a better area to biopsy, would be a better baseline, would be another view of liver with possible metastasis, and would give us evaluation of skeletal system, so that we can identify site that would need plain x-rays were palliative radiation and would establish if the patient should be receiving Xgeva in addition to the planned chemotherapy. Following a biopsy, will help us taking appropriate systemic therapy and chemotherapy and hormonal therapy plus or minus targeted therapy; all depending on the ER and the HER2 status and of course, confirming breast cancer. Later, would want echocardiogram, in a  patient with breast cancer and a history of coronary artery disease as a baseline before any chemotherapy. Later, we would consult radiation/oncology for possible palliation of the breast or other sites. Surgery will perform a biopsy and also look at placing a Port-A-Cath later as access for systemic treatment. Currently have a PICC line for short term access.    ____________________________ Robert G.   Inez Pilgrim, MD rgg:cc D: 06/30/2013 17:37:29 ET T: 06/30/2013 18:20:24 ET JOB#: [id number]  cc: Simonne Come. Inez Pilgrim, MD, <Dictator> Dallas Schimke MD ELECTRONICALLY SIGNED 08/09/2013 18:14

## 2014-10-18 NOTE — Discharge Summary (Signed)
PATIENT NAME:  [name], [name] MR#:  [id number] DATE OF BIRTH:  05-May-1956  DATE OF ADMISSION:  06/21/2013 DATE OF DISCHARGE:  06/29/2013  [name] is a 59 year old white lady followed by Dr. Frazier Richards, who presented with marked cellulitis of the right arm. The patient was found to have a ulceration of the right breast in the right axilla. Apparently, they have been present for some time, but ignored by the patient. In the Emergency Room, the patient was found to have a white count of 18,000. In addition to her cellulitis, she also had a urinary tract infection. The patient was therefore admitted for further evaluation and treatment and possible sepsis.   PAST MEDICAL HISTORY: Notable for hypertension, hyperlipidemia, morbid obesity and coronary artery disease with a previous stent placement.   ALLERGIES: No known drug allergies.   MEDICATIONS ON ADMISSION: Included aspirin 81 mg daily, Allegra 180 mg daily, Lovaza 1000 mg daily, Toprol-XL 50 mg b.i.d. and simvastatin 80 mg daily.   ADMISSION PHYSICAL EXAMINATION: Revealed temperature 101.2, pulse 110, a blood pressure 110/50, respiratory rate 18 and pulse oximetry was 93% on room air. Admission physical exam was notable only for those things mentioned in the present illness and well outlined in the dictated note by the admitting physician.   LABORATORY DATA: Admission CBC showed a hemoglobin of 11.6 with a hematocrit of 36.4, white count was 17,900 and platelet count was 248,000. Admission comprehensive metabolic panel showed a random blood sugar of 168, sodium 131, a potassium of 2.9, calcium of 8.3, a SGOT of 39, albumin of 2.4 and an estimated GFR of 50. Admission urinalysis showed 2+ leukocyte esterase and 3+ bacteria. Blood cultures drawn on admission eventually showed no growth. Admission electrocardiogram showed a sinus tachycardia with T wave changes consistent with inferolateral ischemia.   HOSPITAL COURSE: The patient was  admitted to the regular floor where she was rehydrated with IV fluids and her electrolyte imbalance was corrected. She was treated for a total 7 days with IV Zosyn and vancomycin. She was seen in consultation by the wound clinic and will be followed at home by home health. She was also seen in consultation by oncology and by general surgery. She did undergo a needle biopsy by Dr. Rochel Brome. CT scan of the abdomen and pelvis was obtained that showed skin thickening of the right breast. It was consistent with malignancy and/or cellulitis. There were abnormal masses noted within the deep axillary soft tissues consistent with metastatic involvement of lymph nodes. There was left adrenal mass worrisome for metastatic involvement. There were abnormally enlarged lymph nodes in the porta hepaticus and in the splenic hilum. PET scan showed wildly metastatic breast cancer with extensive metastatic  disease along the right breast and the axilla. There was bilateral adrenal gland metastases and abdominal nodal disease. There was also osseous metastatic disease. Needle biopsy pathology came back following discharge that showed invasive mammary carcinoma, type unspecified.   DISCHARGE DIAGNOSES:  1.  Cellulitis of the right arm.  2.  Metastatic breast cancer.   DISCHARGE MEDICATIONS:  1.  Allegra 180 mg daily.  2.  Lovaza 1000 mg daily.  3.  Simvastatin 80 mg at bedtime.  4.  Enteric-coated aspirin 81 mg daily.  5.  Metoprolol succinate 50 mg b.i.d.  6.  Plavix 75 mg daily.  7.  Enalapril 5 mg daily.   DISCHARGE DISPOSITION: The patient was discharged home with home health for wound care. She was discharged on a low sodium  diet with activity as tolerated. She already has an appointment later in the month with Dr. Ouida Sills, which she was asked to keep and follow up.   ____________________________ Hewitt Blade. Sarina Ser, MD [email or web address] D: 07/07/2013 14:08:30 ET T: 07/08/2013 07:40:14 ET JOB#: [id number]  cc: Hewitt Blade.  Sarina Ser, MD, <Dictator> Lottie Mussel III MD ELECTRONICALLY SIGNED 07/09/2013 6:35

## 2014-10-30 ENCOUNTER — Inpatient Hospital Stay: Payer: BLUE CROSS/BLUE SHIELD | Attending: Internal Medicine

## 2014-10-30 DIAGNOSIS — I10 Essential (primary) hypertension: Secondary | ICD-10-CM | POA: Insufficient documentation

## 2014-10-30 DIAGNOSIS — I890 Lymphedema, not elsewhere classified: Secondary | ICD-10-CM | POA: Diagnosis not present

## 2014-10-30 DIAGNOSIS — Z79899 Other long term (current) drug therapy: Secondary | ICD-10-CM | POA: Insufficient documentation

## 2014-10-30 DIAGNOSIS — Z7982 Long term (current) use of aspirin: Secondary | ICD-10-CM | POA: Diagnosis not present

## 2014-10-30 DIAGNOSIS — C799 Secondary malignant neoplasm of unspecified site: Secondary | ICD-10-CM | POA: Diagnosis not present

## 2014-10-30 DIAGNOSIS — E785 Hyperlipidemia, unspecified: Secondary | ICD-10-CM | POA: Insufficient documentation

## 2014-10-30 DIAGNOSIS — C50919 Malignant neoplasm of unspecified site of unspecified female breast: Secondary | ICD-10-CM

## 2014-10-30 DIAGNOSIS — D509 Iron deficiency anemia, unspecified: Secondary | ICD-10-CM | POA: Insufficient documentation

## 2014-10-30 LAB — CBC WITH DIFFERENTIAL/PLATELET
BASOS ABS: 0.1 10*3/uL (ref 0–0.1)
Basophils Relative: 1 %
EOS ABS: 0.3 10*3/uL (ref 0–0.7)
Eosinophils Relative: 3 %
HEMATOCRIT: 40.7 % (ref 35.0–47.0)
HEMOGLOBIN: 13 g/dL (ref 12.0–16.0)
Lymphocytes Relative: 16 %
Lymphs Abs: 1.7 10*3/uL (ref 1.0–3.6)
MCH: 26.8 pg (ref 26.0–34.0)
MCHC: 31.9 g/dL — ABNORMAL LOW (ref 32.0–36.0)
MCV: 84.2 fL (ref 80.0–100.0)
Monocytes Absolute: 0.7 10*3/uL (ref 0.2–0.9)
Monocytes Relative: 7 %
NEUTROS ABS: 7.6 10*3/uL — AB (ref 1.4–6.5)
Neutrophils Relative %: 73 %
PLATELETS: 268 10*3/uL (ref 150–440)
RBC: 4.83 MIL/uL (ref 3.80–5.20)
RDW: 17.7 % — AB (ref 11.5–14.5)
WBC: 10.3 10*3/uL (ref 3.6–11.0)

## 2014-10-30 LAB — COMPREHENSIVE METABOLIC PANEL
ALBUMIN: 3.5 g/dL (ref 3.5–5.0)
ALK PHOS: 53 U/L (ref 38–126)
ALT: 22 U/L (ref 14–54)
ANION GAP: 7 (ref 5–15)
AST: 21 U/L (ref 15–41)
BUN: 13 mg/dL (ref 6–20)
CO2: 25 mmol/L (ref 22–32)
CREATININE: 0.68 mg/dL (ref 0.44–1.00)
Calcium: 8.8 mg/dL — ABNORMAL LOW (ref 8.9–10.3)
Chloride: 105 mmol/L (ref 101–111)
GFR calc Af Amer: >60 mL/min (ref >60)
GFR calc non Af Amer: >60 mL/min (ref >60)
Glucose, Bld: 148 mg/dL — ABNORMAL HIGH (ref 65–99)
Potassium: 3.9 mmol/L (ref 3.5–5.1)
Sodium: 137 mmol/L (ref 135–145)
TOTAL PROTEIN: 7.4 g/dL (ref 6.5–8.1)
Total Bilirubin: 0.3 mg/dL (ref 0.3–1.2)

## 2014-10-30 LAB — PHOSPHORUS: PHOSPHORUS: 2.3 mg/dL — AB (ref 2.5–4.6)

## 2014-10-31 LAB — CEA: CEA: 223.1 ng/mL — AB (ref 0.0–4.7)

## 2014-11-06 ENCOUNTER — Encounter: Payer: Self-pay | Admitting: Internal Medicine

## 2014-11-06 ENCOUNTER — Inpatient Hospital Stay (HOSPITAL_BASED_OUTPATIENT_CLINIC_OR_DEPARTMENT_OTHER): Payer: BLUE CROSS/BLUE SHIELD | Admitting: Internal Medicine

## 2014-11-06 ENCOUNTER — Inpatient Hospital Stay: Payer: BLUE CROSS/BLUE SHIELD

## 2014-11-06 VITALS — BP 153/93 | HR 116 | Temp 98.5°F | Resp 16 | Wt 356.0 lb

## 2014-11-06 DIAGNOSIS — Z79899 Other long term (current) drug therapy: Secondary | ICD-10-CM | POA: Diagnosis not present

## 2014-11-06 DIAGNOSIS — C799 Secondary malignant neoplasm of unspecified site: Secondary | ICD-10-CM | POA: Diagnosis not present

## 2014-11-06 DIAGNOSIS — C50911 Malignant neoplasm of unspecified site of right female breast: Secondary | ICD-10-CM

## 2014-11-06 DIAGNOSIS — I890 Lymphedema, not elsewhere classified: Secondary | ICD-10-CM

## 2014-11-06 DIAGNOSIS — D509 Iron deficiency anemia, unspecified: Secondary | ICD-10-CM

## 2014-11-06 DIAGNOSIS — C50919 Malignant neoplasm of unspecified site of unspecified female breast: Secondary | ICD-10-CM | POA: Diagnosis not present

## 2014-11-06 DIAGNOSIS — C7951 Secondary malignant neoplasm of bone: Principal | ICD-10-CM

## 2014-11-06 DIAGNOSIS — I10 Essential (primary) hypertension: Secondary | ICD-10-CM

## 2014-11-06 DIAGNOSIS — E875 Hyperkalemia: Secondary | ICD-10-CM

## 2014-11-06 DIAGNOSIS — Z7982 Long term (current) use of aspirin: Secondary | ICD-10-CM

## 2014-11-06 NOTE — Progress Notes (Signed)
[place] note     This 59 y.o. female patient presents to the clinic for follow-up metastatic breast cancer.   Chief Complaint/Problem List:  1. Breast cancer see also 07/08/13 note, initially a T4 N2 M1 ER positive HER-2 negative stage IV disease 2. Anemia iron deficiency attributed to prior bleeding from wound C earlier documents 3. Right arm lymphedema prior cellulitis 4. Right breast nonhealing but stable wound 5. Past history of hypertension hyperlipidemia coronary disease stenting  Note prior therapies extremely first treatment 06/2013, letrozole 06/2013, I branch 09/2013 note nadir of CEA was down to 7.4 but later I branch failure with rising tumor marker, and current therapy started with Faslodex in December/2015    HPI: Patient returns for follow-up. Since last visit have repeated tumor markers. CEA continues to rise confirming treatment failure. No other new complaints.     Review of Systems:  General: No acute distress, No fatigue, No recent weight loss, fever chills or sweats   HEENT: No headache, dizziness, ear or jaw pain, or epistaxis   Lungs: No cough, shortness of breath, at rest, No SOBOE, No wheezing, No chest pain, No, hemoptysis  Cardiac: no chest pain, no palpitations, no orthostasis, no lower extremity    GI: no abdominal pain, nausea, vomiting, diahrrea, or reflux   GU: no dysuria, no hematuria, no vaginal bleeding  Musculoskeletal: no back pain, no bone pain, no acutely painful joints,   Extremities: Stable upper extremity edema wearing the sleeve    Skin: no bruising, no rash  Neuro: no headache, no dizzy, no focal weakness   Psych: no anxiety, no depression   Allergies Allergies  Allergen Reactions  . No Known Allergies     Significant History/PMH: See problem list        Smoking History: Never smoker, Past Smoker, Current smoker, No interested in quitting  PFSH: Family History: History reviewed. No pertinent family history.  Comments:    Social History:  History  Alcohol Use No    Additional Past Medical and Surgical History:    Home Medications:  Prior to Admission medications   Medication Sig Start Date End Date Taking? Authorizing Provider  aspirin EC 81 MG tablet Take 81 mg by mouth daily.   Yes Historical Provider, MD  clopidogrel (PLAVIX) 75 MG tablet Take 75 mg by mouth daily.   Yes Historical Provider, MD  [employer] CALCIUM 600+D 600-800 MG-UNIT TABS Take 1 tablet by mouth daily. At noon 10/03/14  Yes Historical Provider, MD  enalapril (VASOTEC) 5 MG tablet Take 5 mg by mouth daily.   Yes Historical Provider, MD  fexofenadine (ALLEGRA) 180 MG tablet Take 180 mg by mouth at bedtime.   Yes Historical Provider, MD  furosemide (LASIX) 20 MG tablet Take 20 mg by mouth daily. 10/17/14  Yes Historical Provider, MD  metoprolol succinate (TOPROL-XL) 50 MG 24 hr tablet Take 50 mg by mouth 2 (two) times daily. Take with or immediately following a meal.   Yes Historical Provider, MD  omega-3 acid ethyl esters (LOVAZA) 1 G capsule Take 1 g by mouth at bedtime.   Yes Historical Provider, MD  phosphorus (K PHOS NEUTRAL) 155-852-130 MG tablet Take 250 mg by mouth 2 (two) times daily.   Yes Historical Provider, MD  potassium chloride (K-DUR) 10 MEQ tablet Take 1 tablet by mouth daily. 10/17/14  Yes Historical Provider, MD       Vital Signs:  Filed Vitals:   11/06/14 1350  Weight: 356 lb 0.7 oz (  161.5 kg)    Physical Exam:  General: well developed, well nourished, and no acute distress  Mental Status: alert and oriented to person, place and time  Head, Ears, Nose,Throat: No thrush  Respiratory: no rales, rhonchi, or wheezing, no dullness  Cardiovascular: regular rate and rhythm  Gastrointestinal: soft, non tender, no masses or organomegaly  Musculoskeletal: no lower extremity edema no calf tenderness C impression and plan below in prior exam regarding limitations, abduction and extension of the right shoulder limited   Skin: no  rashes, no bruises  Neurological: No gross focal weakness cranial nerves intact  Lymphatics: Not palpable, neck supraclavicular, submandibular, axilla    Psych: Mood, Affect, Unremarkable    Laboratory Results: cre ca phosphate stgtable, cea up to 223 last wk       Radiology Results: No images are attached to the encounter.         Assessment and Plan: Impression: Clinically stable blood tumor markers continue to rise, this is a treatment failure. See also problem list from note prior and see also impression from 09-11-14, note that patient was a prior smoker. Note she never had a colonoscopy. 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 We will hold extremely today. We'll obtain a restaging PET scan as a new baseline with rising tumor markers. We'll continue to follow the CEA. Will resume Xgeva next visit. Chemotherapy when necessary       

## 2014-11-10 ENCOUNTER — Other Ambulatory Visit: Payer: Self-pay | Admitting: Internal Medicine

## 2014-11-21 ENCOUNTER — Encounter: Payer: Self-pay | Admitting: Family Medicine

## 2014-11-21 ENCOUNTER — Other Ambulatory Visit: Payer: Self-pay | Admitting: Family Medicine

## 2014-11-21 DIAGNOSIS — C50911 Malignant neoplasm of unspecified site of right female breast: Secondary | ICD-10-CM

## 2014-11-21 DIAGNOSIS — C50919 Malignant neoplasm of unspecified site of unspecified female breast: Secondary | ICD-10-CM | POA: Insufficient documentation

## 2014-11-21 DIAGNOSIS — C7951 Secondary malignant neoplasm of bone: Principal | ICD-10-CM

## 2014-11-21 HISTORY — DX: Malignant neoplasm of unspecified site of unspecified female breast: C50.919

## 2014-11-25 ENCOUNTER — Ambulatory Visit: Admission: RE | Admit: 2014-11-25 | Payer: BC Managed Care – PPO | Source: Ambulatory Visit

## 2014-11-26 ENCOUNTER — Other Ambulatory Visit: Payer: BC Managed Care – PPO

## 2014-11-26 ENCOUNTER — Ambulatory Visit: Payer: BC Managed Care – PPO | Admitting: Internal Medicine

## 2014-12-10 ENCOUNTER — Other Ambulatory Visit: Payer: BC Managed Care – PPO

## 2014-12-10 ENCOUNTER — Ambulatory Visit: Payer: BC Managed Care – PPO | Admitting: Family Medicine

## 2014-12-18 ENCOUNTER — Other Ambulatory Visit: Payer: BC Managed Care – PPO

## 2014-12-18 ENCOUNTER — Ambulatory Visit: Payer: BC Managed Care – PPO | Admitting: Family Medicine

## 2014-12-25 ENCOUNTER — Encounter: Payer: Self-pay | Admitting: Family Medicine

## 2014-12-25 ENCOUNTER — Inpatient Hospital Stay: Payer: BC Managed Care – PPO | Attending: Family Medicine | Admitting: Family Medicine

## 2014-12-25 ENCOUNTER — Inpatient Hospital Stay: Payer: BC Managed Care – PPO

## 2014-12-25 VITALS — BP 172/78 | HR 75 | Temp 99.7°F | Resp 24 | Wt 348.8 lb

## 2014-12-25 DIAGNOSIS — C7970 Secondary malignant neoplasm of unspecified adrenal gland: Secondary | ICD-10-CM | POA: Insufficient documentation

## 2014-12-25 DIAGNOSIS — C7931 Secondary malignant neoplasm of brain: Secondary | ICD-10-CM | POA: Insufficient documentation

## 2014-12-25 DIAGNOSIS — C7951 Secondary malignant neoplasm of bone: Secondary | ICD-10-CM | POA: Diagnosis not present

## 2014-12-25 DIAGNOSIS — C50911 Malignant neoplasm of unspecified site of right female breast: Secondary | ICD-10-CM | POA: Diagnosis present

## 2014-12-25 DIAGNOSIS — I252 Old myocardial infarction: Secondary | ICD-10-CM | POA: Insufficient documentation

## 2014-12-25 DIAGNOSIS — Z7982 Long term (current) use of aspirin: Secondary | ICD-10-CM | POA: Insufficient documentation

## 2014-12-25 DIAGNOSIS — Z79899 Other long term (current) drug therapy: Secondary | ICD-10-CM | POA: Insufficient documentation

## 2014-12-25 DIAGNOSIS — Z87891 Personal history of nicotine dependence: Secondary | ICD-10-CM | POA: Insufficient documentation

## 2014-12-25 DIAGNOSIS — I10 Essential (primary) hypertension: Secondary | ICD-10-CM | POA: Insufficient documentation

## 2014-12-25 NOTE — Progress Notes (Signed)
[place]  [phone]  Fax:([phone]) [phone]     [name] DOB: May 25, 1956  MR#: [id number]  PNT#:[id number]  Patient Care Team: Kirk Ruths, MD as PCP - General (Internal Medicine)  CHIEF COMPLAINT:  Chief Complaint  Patient presents with  . Breast Cancer  . Follow-up   Stage IV breast cancer with metastases to bone and adrenal gland.  INTERVAL HISTORY:  Patient is here today for further evaluation and continued follow-up regarding breast cancer with to bone, stage IV disease. Originally diagnosed in 2015 she does note an right breast that bleeds intermittently. She denies any pain. She has missed a couple of appointments due to scheduling conflicts. Last noted CEA was in May 2016 and reported as 223. She was previously treated with letrozole and Xgeva starting in January 2015. [name] was added in April 2015 with CEA dropping as low as 7.4. Tumor marker began to rise on Ibrance, Faslodex was started in December 2015, now with rising tumor markers as well  REVIEW OF SYSTEMS:   Review of Systems  Constitutional: Negative for fever, chills, weight loss, malaise/fatigue and diaphoresis.       Right arm lymphedema  HENT: Negative for congestion, ear discharge, ear pain, hearing loss, nosebleeds, sore throat and tinnitus.   Eyes: Negative for blurred vision, double vision, photophobia, pain, discharge and redness.  Respiratory: Negative for cough, hemoptysis, sputum production, shortness of breath, wheezing and stridor.   Cardiovascular: Negative for chest pain, palpitations, orthopnea, claudication, leg swelling and PND.  Gastrointestinal: Negative for heartburn, nausea, vomiting, abdominal pain, diarrhea, constipation, blood in stool and melena.  Genitourinary: Negative.   Musculoskeletal: Negative.   Skin: Negative.   Neurological: Negative for dizziness, tingling, focal weakness, seizures, weakness and headaches.  Endo/Heme/Allergies: Does  not bruise/bleed easily.  Psychiatric/Behavioral: Negative for depression. The patient is not nervous/anxious and does not have insomnia.     As per HPI. Otherwise, a complete review of systems is negatve.  ONCOLOGY HISTORY:  No history exists.    PAST MEDICAL HISTORY: Past Medical History  Diagnosis Date  . Hypertension   . Myocardial infarction   . Breast cancer   . 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 Breast cancer metastasized to bone 11/21/2014    PAST SURGICAL HISTORY: Past Surgical History  Procedure Laterality Date  . Coronary angioplasty with stent placement N/A     FAMILY HISTORY No family history on file.  GYNECOLOGIC HISTORY:  No LMP recorded. Patient is postmenopausal.     ADVANCED DIRECTIVES:    HEALTH MAINTENANCE: History  Substance Use Topics  . Smoking status: Former Research scientist (life sciences)  . Smokeless tobacco: Not on file     Comment: stopped smoking greater than 7 years ago  . Alcohol Use: No     Colonoscopy:  PAP:  Bone density:  Lipid panel:  Allergies  Allergen Reactions  . No Known Allergies     Current Outpatient Prescriptions  Medication Sig Dispense Refill  . aspirin EC 81 MG tablet Take 81 mg by mouth daily.    . clopidogrel (PLAVIX) 75 MG tablet Take 75 mg by mouth daily.    . [employer] CALCIUM 600+D 600-800 MG-UNIT TABS Take 1 tablet by mouth daily. At noon  3  . enalapril (VASOTEC) 5 MG tablet Take 5 mg by mouth daily.    . fexofenadine (ALLEGRA) 180 MG tablet Take 180 mg by mouth at bedtime.    . furosemide (LASIX) 20 MG tablet Take  20 mg by mouth daily.  1  . metoprolol succinate (TOPROL-XL) 50 MG 24 hr tablet Take 50 mg by mouth 2 (two) times daily. Take with or immediately following a meal.    . omega-3 acid ethyl esters (LOVAZA) 1 G capsule Take 1 g by mouth at bedtime.    Marland Kitchen PHOSPHA 250 NEUTRAL 155-852-130 MG tablet TAKE 1 TABLET BY MOUTH TWICE A DAY FOR 30 DAYS 60 tablet 2  . potassium chloride (K-DUR) 10 MEQ tablet Take  1 tablet by mouth daily.  1   No current facility-administered medications for this visit.    OBJECTIVE: BP 172/78 mmHg  Pulse 75  Temp(Src) 99.7 F (37.6 C)  Resp 24  Wt 348 lb 12.3 oz (158.2 kg)   Body mass index is 57.34 kg/(m^2).    ECOG FS:0 - Asymptomatic  General: Well-developed, well-nourished, no acute distress. Eyes: Pink conjunctiva, anicteric sclera. HEENT: Normocephalic, moist mucous membranes, clear oropharnyx. Lungs: Clear to auscultation bilaterally. Heart: Regular rate and rhythm. No rubs, murmurs, or gallops. Abdomen: Soft, nontender, nondistended. No organomegaly noted, normoactive bowel sounds. Musculoskeletal: No edema, cyanosis, or clubbing. Neuro: Alert, answering all questions appropriately. Cranial nerves grossly intact. Skin: No rashes or petechiae noted. Psych: Normal affect. Lymphatics: No cervical, calvicular, axillary or inguinal LAD.   LAB RESULTS:  No visits with results within 3 Day(s) from this visit. Latest known visit with results is:  Clinical Support on 10/30/2014  Component Date Value Ref Range Status  . WBC 10/30/2014 10.3  3.6 - 11.0 K/uL Final  . RBC 10/30/2014 4.83  3.80 - 5.20 MIL/uL Final  . Hemoglobin 10/30/2014 13.0  12.0 - 16.0 g/dL Final  . HCT 10/30/2014 40.7  35.0 - 47.0 % Final  . MCV 10/30/2014 84.2  80.0 - 100.0 fL Final  . MCH 10/30/2014 26.8  26.0 - 34.0 pg Final  . MCHC 10/30/2014 31.9* 32.0 - 36.0 g/dL Final  . RDW 10/30/2014 17.7* 11.5 - 14.5 % Final  . Platelets 10/30/2014 268  150 - 440 K/uL Final  . Neutrophils Relative % 10/30/2014 73   Final  . Neutro Abs 10/30/2014 7.6* 1.4 - 6.5 K/uL Final  . Lymphocytes Relative 10/30/2014 16   Final  . Lymphs Abs 10/30/2014 1.7  1.0 - 3.6 K/uL Final  . Monocytes Relative 10/30/2014 7   Final  . Monocytes Absolute 10/30/2014 0.7  0.2 - 0.9 K/uL Final  . Eosinophils Relative 10/30/2014 3   Final  . Eosinophils Absolute 10/30/2014 0.3  0 - 0.7 K/uL Final  . Basophils  Relative 10/30/2014 1   Final  . Basophils Absolute 10/30/2014 0.1  0 - 0.1 K/uL Final  . CEA 10/30/2014 223.1* 0.0 - 4.7 ng/mL Final   Comment: (NOTE)       Roche ECLIA methodology       Nonsmokers  <3.9                                     Smokers     <5.6 Performed At: [facility] [place], [place] [id number] Lindon Romp MD UD:[id number]   . Phosphorus 10/30/2014 2.3* 2.5 - 4.6 mg/dL Final  . Sodium 10/30/2014 137  135 - 145 mmol/L Final  . Potassium 10/30/2014 3.9  3.5 - 5.1 mmol/L Final  . Chloride 10/30/2014 105  101 - 111 mmol/L Final  . CO2 10/30/2014 25  22 - 32  mmol/L Final  . Glucose, Bld 10/30/2014 148* 65 - 99 mg/dL Final  . BUN 10/30/2014 13  6 - 20 mg/dL Final  . Creatinine, Ser 10/30/2014 0.68  0.44 - 1.00 mg/dL Final  . Calcium 10/30/2014 8.8* 8.9 - 10.3 mg/dL Final  . Total Protein 10/30/2014 7.4  6.5 - 8.1 g/dL Final  . Albumin 10/30/2014 3.5  3.5 - 5.0 g/dL Final  . AST 10/30/2014 21  15 - 41 U/L Final  . ALT 10/30/2014 22  14 - 54 U/L Final  . Alkaline Phosphatase 10/30/2014 53  38 - 126 U/L Final  . Total Bilirubin 10/30/2014 0.3  0.3 - 1.2 mg/dL Final  . GFR calc non Af Amer 10/30/2014 >60  >60 mL/min Final  . GFR calc Af Amer 10/30/2014 >60  >60 mL/min Final   Comment: (NOTE) The eGFR has been calculated using the CKD EPI equation. This calculation has not been validated in all clinical situations. eGFR's persistently <60 mL/min signify possible Chronic Kidney Disease.   . Anion gap 10/30/2014 7  5 - 15 Final    STUDIES: No results found.  ASSESSMENT:  Stage IV breast cancer with bone metastases, adrenal metastasis.  PLAN:  1. Breast cancer. Discussed case with Dr. Oliva Bustard. CEA continues to rise in May was 223. Patient was scheduled to have a PET scan but she was unable to complete due to anxiety as well as limited range of motion and right arm. We'll order a CT scan as she has previously been able to tolerate these. We will  have patient follow-up with Dr. Oliva Bustard following CT scan for results. Patient is aware that she likely has progressive disease and will require some sort of treatment with chemotherapy.   Will schedule CT scan of chest and abdomen with contrast in 1 week.  Patient expressed understanding and was in agreement with this plan. She also understands that She can call clinic at any time with any questions, concerns, or complaints.   Dr. Oliva Bustard was available for consultation and review of plan of care for this patient.    [name], NP   12/25/2014 3:50 PM

## 2014-12-26 LAB — CEA (SERIAL MONITOR): CEA: 367.2 ng/mL — ABNORMAL HIGH (ref 0.0–4.7)

## 2015-01-01 ENCOUNTER — Ambulatory Visit: Payer: BC Managed Care – PPO

## 2015-01-02 ENCOUNTER — Ambulatory Visit: Payer: BC Managed Care – PPO

## 2015-01-05 ENCOUNTER — Ambulatory Visit: Admission: RE | Admit: 2015-01-05 | Payer: BC Managed Care – PPO | Source: Ambulatory Visit

## 2015-01-06 ENCOUNTER — Ambulatory Visit: Payer: BC Managed Care – PPO | Admitting: Oncology

## 2015-01-09 ENCOUNTER — Ambulatory Visit: Admission: RE | Admit: 2015-01-09 | Payer: BC Managed Care – PPO | Source: Ambulatory Visit

## 2015-01-12 ENCOUNTER — Inpatient Hospital Stay: Payer: BC Managed Care – PPO | Admitting: Oncology

## 2015-01-15 ENCOUNTER — Ambulatory Visit
Admission: RE | Admit: 2015-01-15 | Discharge: 2015-01-15 | Disposition: A | Discharge status: Home / Self Care | Payer: BC Managed Care – PPO | Source: Ambulatory Visit | Attending: Family Medicine | Admitting: Family Medicine

## 2015-01-15 DIAGNOSIS — N63 Unspecified lump in breast: Secondary | ICD-10-CM | POA: Insufficient documentation

## 2015-01-15 DIAGNOSIS — C50911 Malignant neoplasm of unspecified site of right female breast: Secondary | ICD-10-CM | POA: Insufficient documentation

## 2015-01-15 DIAGNOSIS — C772 Secondary and unspecified malignant neoplasm of intra-abdominal lymph nodes: Secondary | ICD-10-CM | POA: Diagnosis not present

## 2015-01-15 DIAGNOSIS — C7951 Secondary malignant neoplasm of bone: Secondary | ICD-10-CM | POA: Insufficient documentation

## 2015-01-15 DIAGNOSIS — C7972 Secondary malignant neoplasm of left adrenal gland: Secondary | ICD-10-CM | POA: Insufficient documentation

## 2015-01-15 MED ORDER — IOHEXOL 350 MG/ML SOLN
100.0000 mL | Freq: Once | INTRAVENOUS | Status: AC | PRN
  Administered 2015-01-15: 100 mL via INTRAVENOUS

## 2015-01-20 ENCOUNTER — Inpatient Hospital Stay: Payer: BC Managed Care – PPO | Admitting: Oncology

## 2015-01-22 ENCOUNTER — Inpatient Hospital Stay: Payer: BC Managed Care – PPO | Attending: Oncology | Admitting: Oncology

## 2015-01-22 VITALS — BP 152/112 | HR 121 | Temp 98.4°F | Wt 343.9 lb

## 2015-01-22 DIAGNOSIS — I252 Old myocardial infarction: Secondary | ICD-10-CM | POA: Insufficient documentation

## 2015-01-22 DIAGNOSIS — C7931 Secondary malignant neoplasm of brain: Secondary | ICD-10-CM

## 2015-01-22 DIAGNOSIS — Z170 Estrogen receptor positive status [ER+]: Secondary | ICD-10-CM

## 2015-01-22 DIAGNOSIS — D509 Iron deficiency anemia, unspecified: Secondary | ICD-10-CM | POA: Insufficient documentation

## 2015-01-22 DIAGNOSIS — C7970 Secondary malignant neoplasm of unspecified adrenal gland: Secondary | ICD-10-CM | POA: Insufficient documentation

## 2015-01-22 DIAGNOSIS — C50911 Malignant neoplasm of unspecified site of right female breast: Secondary | ICD-10-CM | POA: Insufficient documentation

## 2015-01-22 DIAGNOSIS — Z87891 Personal history of nicotine dependence: Secondary | ICD-10-CM | POA: Insufficient documentation

## 2015-01-22 DIAGNOSIS — E785 Hyperlipidemia, unspecified: Secondary | ICD-10-CM | POA: Diagnosis not present

## 2015-01-22 DIAGNOSIS — I890 Lymphedema, not elsewhere classified: Secondary | ICD-10-CM | POA: Diagnosis not present

## 2015-01-22 DIAGNOSIS — C7951 Secondary malignant neoplasm of bone: Secondary | ICD-10-CM | POA: Diagnosis not present

## 2015-01-22 DIAGNOSIS — I10 Essential (primary) hypertension: Secondary | ICD-10-CM | POA: Diagnosis not present

## 2015-01-22 NOTE — Progress Notes (Signed)
Patient does not have living will.  Declined information.  Former smoker. 

## 2015-01-25 ENCOUNTER — Encounter: Payer: Self-pay | Admitting: Oncology

## 2015-01-25 NOTE — Progress Notes (Signed)
[place]  [phone]  Fax:([phone]     [name] DOB: 1955-08-25  MR#: [id number]  IHK#:[id number]  Patient Care Team: Kirk Ruths, MD as PCP - General (Internal Medicine)  CHIEF COMPLAINT:  Chief Complaint  Patient presents with  . Follow-up    This 59 y.o. female patient presents to the clinic for follow-up metastatic breast cancer.   Chief Complaint/Problem List: 1. Breast cancer see also 07/08/13 note, initially a T4 N2 M1 ER positive HER-2 negative stage IV disease 2. Anemia iron deficiency attributed to prior bleeding from wound C earlier documents 3. Right arm lymphedema prior cellulitis 4. Right breast nonhealing but stable wound 5. 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  INTERVAL HISTORY:  59 year old lady with locally advanced carcinoma of breast which is ER positive PR positive HER-2/neu negative disease.  Patient has been on high brands and Faslodex.  Tumor is progressing.  Had a CT scan of chest done Here for further follow-up.  This is a transfer of care as patient old oncologist retired. Patient is with the family.  No nausea.  No vomiting.  No bony pain appetite has been stable.  REVIEW OF SYSTEMS:   Review of Systems  Constitutional: Negative for fever, chills, weight loss, malaise/fatigue and diaphoresis.       Right arm lymphedema  HENT: Negative for congestion, ear discharge, ear pain, hearing loss, nosebleeds, sore throat and tinnitus.   Eyes: Negative for blurred vision, double vision, photophobia, pain, discharge and redness.  Respiratory: Negative for cough, hemoptysis, sputum production, shortness of breath, wheezing and stridor.   Cardiovascular: Negative for chest pain, palpitations, orthopnea, claudication, leg swelling and PND.  Gastrointestinal: Negative for heartburn, nausea, vomiting,  abdominal pain, diarrhea, constipation, blood in stool and melena.  Genitourinary: Negative.   Musculoskeletal: Negative.   Skin: Negative.   Neurological: Negative for dizziness, tingling, focal weakness, seizures, weakness and headaches.  Endo/Heme/Allergies: Does not bruise/bleed easily.  Psychiatric/Behavioral: Negative for depression. The patient is not nervous/anxious and does not have insomnia.     As per HPI. Otherwise, a complete review of systems is negatve.  ONCOLOGY HISTORY: Oncology History   1. 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 Previously noted long-term bleeding from the wound of right breast associated with cancer. 2. Patient has extensive right arm lymphedema. Patient is limited in range of motion due to lymphedema and pain in right arm. 3. Patient was started on Xgeva and letrozole in January 2015, [name] was started in April 2015. 4. CEA at lowest 7.4, failure of Ibrance and letrozole with steady rise in CEA. Most recent CEA in May 2016 reported as 223.    6.TX XGEVA FIRST [place] 06/2013 LETRAZOLE 07/12/13  IBRANCE 10/10/13. ibrance 12/27/13..cea lowest at 7.4, but failure of ibrance and letrozole as steady rise since     Breast cancer metastasized to bone   06/27/2013 Initial Diagnosis Breast cancer metastasized to bone    PAST MEDICAL HISTORY: Past Medical History  Diagnosis Date  . Hypertension   . Myocardial infarction   . Breast cancer   . 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 Breast cancer metastasized to bone 11/21/2014    PAST SURGICAL HISTORY: Past Surgical History  Procedure Laterality Date  .  Coronary angioplasty with stent placement N/A     FAMILY HISTORY There is no significant family history of breast cancer, ovarian cancer, colon cancer    ADVANCED DIRECTIVES:  Patient does not have any living will or  healthcare power of attorney.  Information was given .  Available resources had been discussed.  We will follow-up on subsequent appointments regarding this issue  HEALTH MAINTENANCE: History  Substance Use Topics  . Smoking status: Former Research scientist (life sciences)  . Smokeless tobacco: Not on file     Comment: stopped smoking greater than 7 years ago  . Alcohol Use: No     Colonoscopy:  PAP:  Bone density:  Lipid panel:  Allergies  Allergen Reactions  . No Known Allergies     Current Outpatient Prescriptions  Medication Sig Dispense Refill  . aspirin EC 81 MG tablet Take 81 mg by mouth daily.    . clopidogrel (PLAVIX) 75 MG tablet Take 75 mg by mouth daily.    . [facility] CALCIUM 600+D 600-800 MG-UNIT TABS Take 1 tablet by mouth daily. At noon  3  . enalapril (VASOTEC) 5 MG tablet Take 5 mg by mouth daily.    . furosemide (LASIX) 20 MG tablet Take 20 mg by mouth daily.  1  . metoprolol succinate (TOPROL-XL) 50 MG 24 hr tablet Take 50 mg by mouth 2 (two) times daily. Take with or immediately following a meal.    . omega-3 acid ethyl esters (LOVAZA) 1 G capsule Take 1 g by mouth at bedtime.    Marland Kitchen PHOSPHA 250 NEUTRAL 155-852-130 MG tablet TAKE 1 TABLET BY MOUTH TWICE A DAY FOR 30 DAYS 60 tablet 2  . potassium chloride (K-DUR) 10 MEQ tablet Take 1 tablet by mouth daily.  1  . fexofenadine (ALLEGRA) 180 MG tablet Take 180 mg by mouth at bedtime.     No current facility-administered medications for this visit.    OBJECTIVE: BP 152/112 mmHg  Pulse 121  Temp(Src) 98.4 F (36.9 C) (Tympanic)  Wt 343 lb 14.7 oz (156 kg)   Body mass index is 56.54 kg/(m^2).    ECOG FS:0 - Asymptomatic  General: Well-developed, well-nourished, no acute distress. Eyes: Pink conjunctiva, anicteric sclera. HEENT: Normocephalic, moist mucous membranes, clear oropharnyx. Lungs: Clear to auscultation bilaterally. Heart: Regular rate and rhythm. No rubs, murmurs, or gallops. Abdomen: Soft, nontender, nondistended. No  organomegaly noted, normoactive bowel sounds. Musculoskeletal: No edema, cyanosis, or clubbing. Neuro: Alert, answering all questions appropriately. Cranial nerves grossly intact. Skin: No rashes or petechiae noted. Psych: Normal affect. Lymphatics: No cervical, calvicular, axillary or inguinal LAD.   LAB RESULTS: Component     Latest Ref Rng 01/30/2014 02/26/2014 03/20/2014 04/24/2014 05/13/2014  CEA     0.0 - 4.7 ng/mL 15.4 (H) 31.4 (H) 33.5 (H) 66.4 (H) 73.7 (H)   Component     Latest Ref Rng 06/05/2014 08/01/2014 10/02/2014 10/30/2014 12/25/2014  CEA     0.0 - 4.7 ng/mL 75.8 (H) 91.8 (H) 173.1 (H) 223.1 (H) 367.2 (H)            No visits with results within 3 Day(s) from this visit. Latest known visit with results is:  Appointment on 12/25/2014  Component Date Value Ref Range Status  . CEA 12/25/2014 367.2* 0.0 - 4.7 ng/mL Final   Comment: (NOTE)       Roche ECLIA methodology       Nonsmokers  <3.9  Smokers     <5.6 Performed At: [facility] [place], [place] [id number] Lindon Romp MD [id number]   ct sxcan  scan of abdomen on January 15, 2015 Extensive tumor involving in the right shoulder, right lateral chest wall, and right axilla (series 2/images 8, 9, 14, 19, and 27). Representative measurements include:  --10.0 x 6.2 cm in the right lateral chest wall (series 2/ image 19), previously 9.5 x 5.4 cm  --5.6 x 4.2 cm in the right posterior shoulder/ back (series 2/image 8), previously 4.1 x 4.4 cm  Right superior axillary node measuring 15 mm short axis (series 2/ image 10) and right supraclavicular nodes measuring up to 13 mm short axis (series 2/image 4).   ASSESSMENT:  Stage IV breast cancer with bone metastases, adrenal metastasis. Progressive disease by CT scan criteria and tumor markers   Prolonged discussion with patient.  Patient is feeling all anti-hormonal therapy Patient was advised to stop  IBRANCE Possibility of chemotherapy either with the reviewing or Abraxane May consider for protocol Total duration of visit was 45 minutes.  50% or more time was spent in counseling patient and family regarding prognosis and options of treatment and available resources CT scan has been reviewed independently   Forest Gleason, MD   01/25/2015 5:21 PM

## 2015-01-27 ENCOUNTER — Encounter: Payer: Self-pay | Admitting: Oncology

## 2015-01-27 ENCOUNTER — Inpatient Hospital Stay: Payer: BC Managed Care – PPO | Attending: Oncology

## 2015-01-27 ENCOUNTER — Inpatient Hospital Stay (HOSPITAL_BASED_OUTPATIENT_CLINIC_OR_DEPARTMENT_OTHER): Payer: BC Managed Care – PPO | Admitting: Oncology

## 2015-01-27 ENCOUNTER — Encounter: Payer: Self-pay | Admitting: *Deleted

## 2015-01-27 VITALS — BP 183/96 | HR 117 | Temp 97.7°F | Wt 345.0 lb

## 2015-01-27 DIAGNOSIS — E669 Obesity, unspecified: Secondary | ICD-10-CM | POA: Insufficient documentation

## 2015-01-27 DIAGNOSIS — D649 Anemia, unspecified: Secondary | ICD-10-CM | POA: Insufficient documentation

## 2015-01-27 DIAGNOSIS — Z9221 Personal history of antineoplastic chemotherapy: Secondary | ICD-10-CM

## 2015-01-27 DIAGNOSIS — G4730 Sleep apnea, unspecified: Secondary | ICD-10-CM

## 2015-01-27 DIAGNOSIS — C7951 Secondary malignant neoplasm of bone: Secondary | ICD-10-CM | POA: Insufficient documentation

## 2015-01-27 DIAGNOSIS — C7970 Secondary malignant neoplasm of unspecified adrenal gland: Secondary | ICD-10-CM | POA: Insufficient documentation

## 2015-01-27 DIAGNOSIS — Z87891 Personal history of nicotine dependence: Secondary | ICD-10-CM

## 2015-01-27 DIAGNOSIS — I10 Essential (primary) hypertension: Secondary | ICD-10-CM | POA: Insufficient documentation

## 2015-01-27 DIAGNOSIS — I890 Lymphedema, not elsewhere classified: Secondary | ICD-10-CM | POA: Diagnosis not present

## 2015-01-27 DIAGNOSIS — C50911 Malignant neoplasm of unspecified site of right female breast: Secondary | ICD-10-CM | POA: Diagnosis not present

## 2015-01-27 DIAGNOSIS — I252 Old myocardial infarction: Secondary | ICD-10-CM | POA: Diagnosis not present

## 2015-01-27 DIAGNOSIS — Z79899 Other long term (current) drug therapy: Secondary | ICD-10-CM | POA: Diagnosis not present

## 2015-01-27 DIAGNOSIS — Z170 Estrogen receptor positive status [ER+]: Secondary | ICD-10-CM | POA: Insufficient documentation

## 2015-01-27 DIAGNOSIS — Z7982 Long term (current) use of aspirin: Secondary | ICD-10-CM | POA: Insufficient documentation

## 2015-01-27 NOTE — Patient Instructions (Signed)

## 2015-01-27 NOTE — Progress Notes (Signed)
Patient does not have living will.  Former smoker. 

## 2015-01-27 NOTE — Progress Notes (Addendum)
[place]  Telephone:(336) [phone]  Fax:(336) [phone]     [name] DOB: 12/24/1955  MR#: [id number]  BOF#:[id number]  Patient Care Team: Kirk Ruths, MD as PCP - General (Internal Medicine)  CHIEF COMPLAINT:  Chief Complaint  Patient presents with  . Follow-up    This 59 y.o. female patient presents to the clinic for follow-up metastatic breast cancer.   Chief Complaint/Problem List: 1. Breast cancer see also 07/08/13 note, initially a T4 N2 M1 ER positive HER-2 negative stage IV disease 2. Anemia iron deficiency attributed to prior bleeding from wound C earlier documents 3. Right arm lymphedema prior cellulitis 4. Right breast nonhealing but stable wound 5. 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  INTERVAL HISTORY:  59 year old lady with locally advanced carcinoma of breast which is ER positive PR positive HER-2/neu negative disease.  Patient has been on high brands and Faslodex.  Tumor is progressing.  Had a CT scan of chest done Here for further follow-up.  This is a transfer of care as patient old oncologist retired. Patient is with the family.  No nausea.  No vomiting.  No bony pain appetite has been stable.   January 27, 2015 Patient is here for chemotherapy class but had a number of questions accompanied by sister to discuss options of therapy  REVIEW OF SYSTEMS:   Review of Systems  Constitutional: Negative for fever, chills, weight loss, malaise/fatigue and diaphoresis.       Right arm lymphedema  HENT: Negative for congestion, ear discharge, ear pain, hearing loss, nosebleeds, sore throat and tinnitus.   Eyes: Negative for blurred vision, double vision, photophobia, pain, discharge and redness.  Respiratory: Negative for cough, hemoptysis, sputum production, shortness of breath, wheezing and stridor.   Cardiovascular: Negative for  chest pain, palpitations, orthopnea, claudication, leg swelling and PND.  Gastrointestinal: Negative for heartburn, nausea, vomiting, abdominal pain, diarrhea, constipation, blood in stool and melena.  Genitourinary: Negative.   Musculoskeletal: Negative.   Skin: Negative.   Neurological: Negative for dizziness, tingling, focal weakness, seizures, weakness and headaches.  Endo/Heme/Allergies: Does not bruise/bleed easily.  Psychiatric/Behavioral: Negative for depression. The patient is not nervous/anxious and does not have insomnia.     As per HPI. Otherwise, a complete review of systems is negatve.  ONCOLOGY HISTORY: Oncology History   1. 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 Previously noted long-term bleeding from the wound of right breast associated with cancer. 2. Patient has extensive right arm lymphedema. Patient is limited in range of motion due to lymphedema and pain in right arm. 3. Patient was started on Xgeva and letrozole in January 2015, [name] was started in April 2015. 4. CEA at lowest 7.4, failure of Ibrance and letrozole with steady rise in CEA. Most recent CEA in May 2016 reported as 223.    6.TX XGEVA FIRST [place] 06/2013 LETRAZOLE 07/12/13  IBRANCE 10/10/13. ibrance 12/27/13..cea lowest at 7.4, but failure of ibrance and letrozole as steady rise since     Breast cancer metastasized to bone   06/27/2013 Initial Diagnosis Breast cancer metastasized to bone    Breast CA   01/28/2015 Initial Diagnosis Breast CA    PAST MEDICAL HISTORY: Past Medical History  Diagnosis Date  . Hypertension   . Myocardial infarction   .  Breast cancer   . Breast cancer metastasized to bone 11/21/2014  . Anginal pain   . Sleep apnea   . Anemia     PAST SURGICAL HISTORY: Past Surgical History  Procedure Laterality Date  . Coronary angioplasty with stent placement N/A   .  Hernia repair      umbilical hernia repair  . Portacath placement Right 02/03/2015    Procedure: INSERTION PORT-A-CATH;  Surgeon: [name], MD;  Location: [facility] ORS;  Service: General;  Laterality: Right;    FAMILY HISTORY There is no significant family history of breast cancer, ovarian cancer, colon cancer    ADVANCED DIRECTIVES:  Patient does not have any living will or healthcare power of attorney.  Information was given .  Available resources had been discussed.  We will follow-up on subsequent appointments regarding this issue  HEALTH MAINTENANCE: Social History  Substance Use Topics  . Smoking status: Former Smoker -- 1.00 packs/day    Types: Cigarettes    Quit date: 10/25/2004  . Smokeless tobacco: Never Used     Comment: stopped smoking greater than 7 years ago  . Alcohol Use: No     Colonoscopy:  PAP:  Bone density:  Lipid panel:  Allergies  Allergen Reactions  . No Known Allergies     Current Outpatient Prescriptions  Medication Sig Dispense Refill  . aspirin EC 81 MG tablet Take 81 mg by mouth daily.    . clopidogrel (PLAVIX) 75 MG tablet Take 75 mg by mouth daily.    . [facility] CALCIUM 600+D 600-800 MG-UNIT TABS Take 1 tablet by mouth daily. At noon  3  . fexofenadine (ALLEGRA) 180 MG tablet Take 180 mg by mouth at bedtime.    . furosemide (LASIX) 20 MG tablet Take 20 mg by mouth daily.  1  . metoprolol succinate (TOPROL-XL) 50 MG 24 hr tablet Take 50 mg by mouth 2 (two) times daily. Take with or immediately following a meal.    . omega-3 acid ethyl esters (LOVAZA) 1 G capsule Take 1 g by mouth at bedtime.    Marland Kitchen PHOSPHA 250 NEUTRAL 155-852-130 MG tablet TAKE 1 TABLET BY MOUTH TWICE A DAY FOR 30 DAYS 60 tablet 2  . potassium chloride (K-DUR) 10 MEQ tablet Take 10 mEq by mouth daily.   1  . acetaminophen (TYLENOL) 500 MG tablet Take 1,000 mg by mouth every 6 (six) hours as needed.    . lidocaine-prilocaine (EMLA) cream Apply 1 application topically as needed.  30 g 3  . lisinopril (PRINIVIL,ZESTRIL) 20 MG tablet Take 20 mg by mouth daily.    . simvastatin (ZOCOR) 80 MG tablet Take 80 mg by mouth at bedtime.     No current facility-administered medications for this visit.    OBJECTIVE: BP 183/96 mmHg  Pulse 117  Temp(Src) 97.7 F (36.5 C) (Tympanic)  Wt 345 lb 0.3 oz (156.5 kg)   Body mass index is 56.73 kg/(m^2).    ECOG FS:0 - Asymptomatic  General: Well-developed, well-nourished, no acute distress. Eyes: Pink conjunctiva, anicteric sclera. HEENT: Normocephalic, moist mucous membranes, clear oropharnyx. Lungs: Clear to auscultation bilaterally. Heart: Regular rate and rhythm. No rubs, murmurs, or gallops. Abdomen: Soft, nontender, nondistended. No organomegaly noted, normoactive bowel sounds. Musculoskeletal: No edema, cyanosis, or clubbing. Neuro: Alert, answering all questions appropriately. Cranial nerves grossly intact. Skin: No rashes or petechiae noted. Psych: Normal affect. Lymphatics: No cervical, calvicular, axillary or inguinal LAD.   LAB RESULTS: Component     Latest Ref  Rng 01/30/2014 02/26/2014 03/20/2014 04/24/2014 05/13/2014  CEA     0.0 - 4.7 ng/mL 15.4 (H) 31.4 (H) 33.5 (H) 66.4 (H) 73.7 (H)   Component     Latest Ref Rng 06/05/2014 08/01/2014 10/02/2014 10/30/2014 12/25/2014  CEA     0.0 - 4.7 ng/mL 75.8 (H) 91.8 (H) 173.1 (H) 223.1 (H) 367.2 (H)            No visits with results within 3 Day(s) from this visit. Latest known visit with results is:  Appointment on 12/25/2014  Component Date Value Ref Range Status  . CEA 12/25/2014 367.2* 0.0 - 4.7 ng/mL Final   Comment: (NOTE)       Roche ECLIA methodology       Nonsmokers  <3.9                                     Smokers     <5.6 Performed At: [facility] [place], [place] [id number] Lindon Romp MD [id number]   ct sxcan  scan of abdomen on January 15, 2015 Extensive tumor involving in the right shoulder, right lateral chest wall,  and right axilla (series 2/images 8, 9, 14, 19, and 27). Representative measurements include:  --10.0 x 6.2 cm in the right lateral chest wall (series 2/ image 19), previously 9.5 x 5.4 cm  --5.6 x 4.2 cm in the right posterior shoulder/ back (series 2/image 8), previously 4.1 x 4.4 cm  Right superior axillary node measuring 15 mm short axis (series 2/ image 10) and right supraclavicular nodes measuring up to 13 mm short axis (series 2/image 4).   ASSESSMENT:  Stage IV breast cancer with bone metastases, adrenal metastasis. Progressive disease by CT scan criteria and tumor markers  Entire visit of 25 minutes was spent in explaining availability of protocol between randomization between Taxol and 80 [place] in therapy versus off protocol Abraxane therapy H&N number of questions which are answered.   Forest Gleason, MD   02/05/2015 9:17 AM

## 2015-01-28 DIAGNOSIS — E119 Type 2 diabetes mellitus without complications: Secondary | ICD-10-CM | POA: Insufficient documentation

## 2015-01-28 DIAGNOSIS — I2510 Atherosclerotic heart disease of native coronary artery without angina pectoris: Secondary | ICD-10-CM | POA: Insufficient documentation

## 2015-01-28 DIAGNOSIS — G4733 Obstructive sleep apnea (adult) (pediatric): Secondary | ICD-10-CM | POA: Insufficient documentation

## 2015-01-29 ENCOUNTER — Inpatient Hospital Stay: Payer: BC Managed Care – PPO | Admitting: *Deleted

## 2015-01-29 ENCOUNTER — Other Ambulatory Visit: Payer: Self-pay | Admitting: *Deleted

## 2015-01-29 DIAGNOSIS — C50911 Malignant neoplasm of unspecified site of right female breast: Secondary | ICD-10-CM

## 2015-01-29 DIAGNOSIS — C7951 Secondary malignant neoplasm of bone: Principal | ICD-10-CM

## 2015-02-02 ENCOUNTER — Telehealth: Payer: Self-pay | Admitting: *Deleted

## 2015-02-02 ENCOUNTER — Encounter
Admission: RE | Admit: 2015-02-02 | Discharge: 2015-02-02 | Disposition: A | Discharge status: Home / Self Care | Payer: BC Managed Care – PPO | Source: Ambulatory Visit | Attending: Surgery | Admitting: Surgery

## 2015-02-02 DIAGNOSIS — C50911 Malignant neoplasm of unspecified site of right female breast: Secondary | ICD-10-CM

## 2015-02-02 DIAGNOSIS — Z79899 Other long term (current) drug therapy: Secondary | ICD-10-CM | POA: Diagnosis not present

## 2015-02-02 DIAGNOSIS — C50411 Malignant neoplasm of upper-outer quadrant of right female breast: Secondary | ICD-10-CM | POA: Diagnosis not present

## 2015-02-02 DIAGNOSIS — I10 Essential (primary) hypertension: Secondary | ICD-10-CM | POA: Diagnosis not present

## 2015-02-02 DIAGNOSIS — Z955 Presence of coronary angioplasty implant and graft: Secondary | ICD-10-CM | POA: Diagnosis not present

## 2015-02-02 DIAGNOSIS — I252 Old myocardial infarction: Secondary | ICD-10-CM | POA: Diagnosis not present

## 2015-02-02 DIAGNOSIS — C7951 Secondary malignant neoplasm of bone: Principal | ICD-10-CM

## 2015-02-02 DIAGNOSIS — Z87891 Personal history of nicotine dependence: Secondary | ICD-10-CM | POA: Diagnosis not present

## 2015-02-02 DIAGNOSIS — C7931 Secondary malignant neoplasm of brain: Secondary | ICD-10-CM | POA: Diagnosis not present

## 2015-02-02 HISTORY — DX: Sleep apnea, unspecified: G47.30

## 2015-02-02 HISTORY — DX: Anemia, unspecified: D64.9

## 2015-02-02 HISTORY — DX: Angina pectoris, unspecified: I20.9

## 2015-02-02 LAB — CBC
HEMATOCRIT: 40.9 % (ref 35.0–47.0)
Hemoglobin: 12.8 g/dL (ref 12.0–16.0)
MCH: 25.4 pg — AB (ref 26.0–34.0)
MCHC: 31.4 g/dL — ABNORMAL LOW (ref 32.0–36.0)
MCV: 81.1 fL (ref 80.0–100.0)
Platelets: 353 10*3/uL (ref 150–440)
RBC: 5.04 MIL/uL (ref 3.80–5.20)
RDW: 18.4 % — ABNORMAL HIGH (ref 11.5–14.5)
WBC: 12.5 10*3/uL — AB (ref 3.6–11.0)

## 2015-02-02 LAB — DIFFERENTIAL
Basophils Absolute: 0.1 10*3/uL (ref 0–0.1)
Basophils Relative: 1 %
Eosinophils Absolute: 0.2 10*3/uL (ref 0–0.7)
Eosinophils Relative: 2 %
Lymphocytes Relative: 15 %
Lymphs Abs: 1.8 10*3/uL (ref 1.0–3.6)
Monocytes Absolute: 0.8 10*3/uL (ref 0.2–0.9)
Monocytes Relative: 6 %
NEUTROS PCT: 76 %
Neutro Abs: 9.5 10*3/uL — ABNORMAL HIGH (ref 1.4–6.5)

## 2015-02-02 LAB — POTASSIUM: Potassium: 3.3 mmol/L — ABNORMAL LOW (ref 3.5–5.1)

## 2015-02-02 MED ORDER — LIDOCAINE-PRILOCAINE 2.5-2.5 % EX CREA: 1.0000 "application " | TOPICAL_CREAM | CUTANEOUS | Status: DC | PRN

## 2015-02-02 NOTE — Telephone Encounter (Signed)
New rx for emla cream escribed to pharmacy.

## 2015-02-02 NOTE — Patient Instructions (Addendum)
  Your procedure is scheduled [id number] 9, 2016 (Tuesday) Report to Day Surgery. To find out your arrival time please call [phone] between 1PM - 3PM on (Arrive at 11:30 am).  Remember: Instructions that are not followed completely may result in serious medical risk, up to and including death, or upon the discretion of your surgeon and anesthesiologist your surgery may need to be rescheduled.    ___x_ 1. Do not eat food or drink liquids after midnight. No gum chewing or hard candies.     ____ 2. No Alcohol for 24 hours before or after surgery.   ____ 3. Bring all medications with you on the day of surgery if instructed.    ___x_ 4. Notify your doctor if there is any change in your medical condition     (cold, fever, infections).     Do not wear jewelry, make-up, hairpins, clips or nail polish.  Do not wear lotions, powders, or perfumes. You may wear deodorant.  Do not shave 48 hours prior to surgery. Men may shave face and neck.  Do not bring valuables to the hospital.    [facility] is not responsible for any belongings or valuables.               Contacts, dentures or bridgework may not be worn into surgery.  Leave your suitcase in the car. After surgery it may be brought to your room.  For patients admitted to the hospital, discharge time is determined by your                treatment team.   Patients discharged the day of surgery will not be allowed to drive home.   Please read over the following fact sheets that you were given:   Surgical Site Infection Prevention   ____ Take these medicines the morning of surgery with A SIP OF WATER:    1. Lisinopril  2. Metoprolol  3.   4.  5.  6.  ____ Fleet Enema (as directed)   __x__ Use CHG Soap as directed  ____ Use inhalers on the day of surgery  ____ Stop metformin 2 days prior to surgery    ____ Take 1/2 of usual insulin dose the night before surgery and none on the morning of surgery.   __x_ Stop  Coumadin/Plavix/aspirin on (Patient stopped Aspirin and Plavix on January 24, 2015) ____ Stop Anti-inflammatories on    ___X_ Stop supplements until after surgery. (Stop omega 3 now)   ___x_ Bring C-Pap to the hospital.

## 2015-02-02 NOTE — Telephone Encounter (Signed)
-----   Message from Leona Singleton, RN sent at 01/27/2015  3:46 PM EDT ----- Regarding: EMLA cream Sherilyn Dacosta,  Can you call in EMLA cream for this patient?  Thank you, Basilia Jumbo

## 2015-02-03 ENCOUNTER — Encounter: Payer: Self-pay | Admitting: *Deleted

## 2015-02-03 ENCOUNTER — Ambulatory Visit: Payer: BC Managed Care – PPO

## 2015-02-03 ENCOUNTER — Ambulatory Visit: Payer: BC Managed Care – PPO | Admitting: Anesthesiology

## 2015-02-03 ENCOUNTER — Ambulatory Visit: Admission: RE | Admit: 2015-02-03 | Payer: BC Managed Care – PPO | Source: Ambulatory Visit | Admitting: Surgery

## 2015-02-03 ENCOUNTER — Ambulatory Visit
Admission: RE | Admit: 2015-02-03 | Discharge: 2015-02-03 | Disposition: A | Discharge status: Home / Self Care | Payer: BC Managed Care – PPO | Source: Ambulatory Visit | Attending: Surgery | Admitting: Surgery

## 2015-02-03 ENCOUNTER — Encounter
Admission: RE | Disposition: A | Discharge status: Home / Self Care | Payer: Self-pay | Source: Ambulatory Visit | Attending: Surgery

## 2015-02-03 ENCOUNTER — Encounter: Payer: Self-pay | Admitting: Oncology

## 2015-02-03 DIAGNOSIS — I252 Old myocardial infarction: Secondary | ICD-10-CM | POA: Insufficient documentation

## 2015-02-03 DIAGNOSIS — C50411 Malignant neoplasm of upper-outer quadrant of right female breast: Secondary | ICD-10-CM | POA: Insufficient documentation

## 2015-02-03 DIAGNOSIS — Z955 Presence of coronary angioplasty implant and graft: Secondary | ICD-10-CM | POA: Insufficient documentation

## 2015-02-03 DIAGNOSIS — C7931 Secondary malignant neoplasm of brain: Secondary | ICD-10-CM | POA: Insufficient documentation

## 2015-02-03 DIAGNOSIS — I10 Essential (primary) hypertension: Secondary | ICD-10-CM | POA: Insufficient documentation

## 2015-02-03 DIAGNOSIS — Z87891 Personal history of nicotine dependence: Secondary | ICD-10-CM | POA: Insufficient documentation

## 2015-02-03 DIAGNOSIS — Z79899 Other long term (current) drug therapy: Secondary | ICD-10-CM | POA: Insufficient documentation

## 2015-02-03 DIAGNOSIS — C50911 Malignant neoplasm of unspecified site of right female breast: Secondary | ICD-10-CM

## 2015-02-03 HISTORY — PX: PORTACATH PLACEMENT: SHX2246

## 2015-02-03 LAB — POTASSIUM: Potassium, serum: 3.9

## 2015-02-03 SURGERY — INSERTION, TUNNELED CENTRAL VENOUS DEVICE, WITH PORT
Anesthesia: General | Laterality: Right

## 2015-02-03 MED ORDER — LIDOCAINE HCL (PF) 1 % IJ SOLN
INTRAMUSCULAR | Status: DC | PRN
  Administered 2015-02-03: 14 mL via SUBCUTANEOUS

## 2015-02-03 MED ORDER — FAMOTIDINE 20 MG PO TABS
20.0000 mg | ORAL_TABLET | Freq: Once | ORAL | Status: AC
  Administered 2015-02-03: 20 mg via ORAL

## 2015-02-03 MED ORDER — HYDROMORPHONE HCL 1 MG/ML IJ SOLN
INTRAMUSCULAR | Status: AC
  Administered 2015-02-03: 0.5 mg via INTRAVENOUS
  Filled 2015-02-03: qty 1

## 2015-02-03 MED ORDER — LACTATED RINGERS IV SOLN
INTRAVENOUS | Status: DC
  Administered 2015-02-03 (×2): via INTRAVENOUS

## 2015-02-03 MED ORDER — HYDROMORPHONE HCL 1 MG/ML IJ SOLN
0.2500 mg | INTRAMUSCULAR | Status: DC | PRN
  Administered 2015-02-03 (×2): 0.5 mg via INTRAVENOUS

## 2015-02-03 MED ORDER — SODIUM CHLORIDE 0.9 % IJ SOLN
INTRAMUSCULAR | Status: AC
  Filled 2015-02-03: qty 50

## 2015-02-03 MED ORDER — HEPARIN SODIUM (PORCINE) 5000 UNIT/ML IJ SOLN
INTRAMUSCULAR | Status: AC
  Filled 2015-02-03: qty 1

## 2015-02-03 MED ORDER — FENTANYL CITRATE (PF) 100 MCG/2ML IJ SOLN
25.0000 ug | INTRAMUSCULAR | Status: DC | PRN
  Administered 2015-02-03 (×4): 25 ug via INTRAVENOUS

## 2015-02-03 MED ORDER — PROPOFOL INFUSION 10 MG/ML OPTIME
INTRAVENOUS | Status: DC | PRN
  Administered 2015-02-03: 15 ug/kg/min via INTRAVENOUS

## 2015-02-03 MED ORDER — FENTANYL CITRATE (PF) 100 MCG/2ML IJ SOLN
INTRAMUSCULAR | Status: DC | PRN
  Administered 2015-02-03 (×4): 25 ug via INTRAVENOUS

## 2015-02-03 MED ORDER — DEXTROSE 5 % IV SOLN
3.0000 g | Freq: Once | INTRAVENOUS | Status: AC
  Administered 2015-02-03: 3 g via INTRAVENOUS
  Filled 2015-02-03: qty 3000

## 2015-02-03 MED ORDER — LIDOCAINE HCL (PF) 1 % IJ SOLN
INTRAMUSCULAR | Status: AC
  Filled 2015-02-03: qty 30

## 2015-02-03 MED ORDER — FENTANYL CITRATE (PF) 100 MCG/2ML IJ SOLN
INTRAMUSCULAR | Status: AC
  Administered 2015-02-03: 25 ug via INTRAVENOUS
  Filled 2015-02-03: qty 2

## 2015-02-03 MED ORDER — FAMOTIDINE 20 MG PO TABS
ORAL_TABLET | ORAL | Status: DC
Start: 2015-02-03 — End: 2015-02-03
  Filled 2015-02-03: qty 1

## 2015-02-03 MED ORDER — HEPARIN SODIUM (PORCINE) 1000 UNIT/ML IJ SOLN
INTRAMUSCULAR | Status: DC | PRN
  Administered 2015-02-03: 44 mL via INTRAMUSCULAR

## 2015-02-03 SURGICAL SUPPLY — 18 items
CANISTER SUCT 1200ML W/VALVE (MISCELLANEOUS) ×3 IMPLANT
CHLORAPREP W/TINT 26ML (MISCELLANEOUS) ×3 IMPLANT
COVER LIGHT HANDLE STERIS (MISCELLANEOUS) ×6 IMPLANT
GLOVE BIO SURGEON STRL SZ7.5 (GLOVE) ×3 IMPLANT
GOWN STRL REUS W/ TWL LRG LVL3 (GOWN DISPOSABLE) ×3 IMPLANT
GOWN STRL REUS W/TWL LRG LVL3 (GOWN DISPOSABLE) ×6
KIT RM TURNOVER STRD PROC AR (KITS) ×3 IMPLANT
LABEL OR SOLS (LABEL) ×3 IMPLANT
LIQUID BAND (GAUZE/BANDAGES/DRESSINGS) ×3 IMPLANT
NEEDLE FILTER BLUNT 18X 1/2SAF (NEEDLE) ×2
NEEDLE FILTER BLUNT 18X1 1/2 (NEEDLE) ×1 IMPLANT
PACK PORT-A-CATH (MISCELLANEOUS) ×3 IMPLANT
PAD GROUND ADULT SPLIT (MISCELLANEOUS) ×3 IMPLANT
PORTACATH POWER 8F (Port) ×3 IMPLANT
SUT SILK 4 0 SH (SUTURE) ×3 IMPLANT
SUT VIC AB 5-0 RB1 27 (SUTURE) ×3 IMPLANT
SYR 3ML LL SCALE MARK (SYRINGE) ×3 IMPLANT
SYRINGE 10CC LL (SYRINGE) ×3 IMPLANT

## 2015-02-03 NOTE — OR Nursing (Signed)
Dr. Tamala Julian here to speak with patient and review post op information. Answered questions for patient and her husband.

## 2015-02-03 NOTE — H&P (Signed)
  She reports no change in condition since office visit.  Serum k drawn yesterday was 3.3  Plan portacath as discussed.  Increase  K supplement to BID

## 2015-02-03 NOTE — Transfer of Care (Signed)
Immediate Anesthesia Transfer of Care Note  Patient: [name]  Procedure(s) Performed: Procedure(s): INSERTION PORT-A-CATH (Right)  Patient Location: PACU  Anesthesia Type:General  Level of Consciousness: awake, alert  and oriented  Airway & Oxygen Therapy: Patient Spontanous Breathing  Post-op Assessment: Report given to RN and Post -op Vital signs reviewed and stable  Post vital signs: Reviewed and stable  Last Vitals:  Filed Vitals:   02/03/15 1459  BP: 115/66  Pulse: 92  Temp: 37.3 C  Resp: 20    Complications: No apparent anesthesia complications

## 2015-02-03 NOTE — Op Note (Signed)
OPERATIVE REPORT  PREOPERATIVE  DIAGNOSIS: . Breast cancer  POSTOPERATIVE DIAGNOSIS: . Breast cancer  PROCEDURE: . Insertion of central venous catheter with subcutaneous infusion port.  ANESTHESIA:  General  SURGEON: Rochel Brome  MD   INDICATIONS: . She has history of cancer of the right breast with bony metastasis now needing central venous access for chemotherapy.  With the patient on the operating table in the supine position a rolled sheet was placed behind the shoulder blades to extend the neck. Her head was turned 15 to the right. The jugular vein was identified with ultrasound. She was monitored and sedated by the anesthesia staff. The site was prepared with ChloraPrep and draped in a sterile manner  The skin beneath the clavicle was infiltrated with 1% Xylocaine. A transversely oriented 3 cm subclavian incision was made and carried down through subcutaneous tissues to the deep fascia. A subcutaneous pouch was created large enough to admit the [place] port. Next the jugular vein was identified with ultrasound. The skin overlying the jugular vein was infiltrated with 1% Xylocaine. A transversely oriented 5 mm incision was made and carried down through subcutaneous tissues. With the patient in the Trendelenburg positioning the jugular vein and carotid artery were identified with ultrasound. Using ultrasound guidance a needle was inserted into the jugular vein and aspirated blood. The guidewire was advanced into the jugular vein and into the central circulation. An ultrasonic image was saved for the paper chart. Fluoroscopy was used to demonstrate the position of the guidewire in the vena cava. The needle was withdrawn. The dilator and introducer sheath were advanced over the guidewire. This was further demonstrated with fluoroscopy. The dilator and guidewire were removed. The catheter was advanced down through the sheath and the sheath was peeled away. Fluoroscopy was used to demonstrate the  position of the tip of the catheter in the superior vena cava. This is out of 0.19 cm from the skin. Next the catheter was tunneled to the subclavian incision. The catheter was cut to fit and attached to the [place] port using the accompanying sleeve to secure it. The port was placed into the subcutaneous pouch it was sutured to the fascia with 4-0 silk 2-point fixation. The Huber needle was inserted into the port and at first did not obtain any blood return. But after several subsequent flushes  blood was aspirated into the  syringe. Fluoroscopy was used again to image the course of the catheter and saw no kinks in the catheter. The catheter appeared to be in satisfactory position. The port was further flushed with heparinized saline solution and the Huber needle was removed. The patient was placed in the reverse Trendelenburg position. The neck incision was closed with a 5-0 Vicryl subcuticular suture. The subclavian incision was closed with 5-0 Vicryl subcutaneous sutures followed by a running 5-0 Vicryl subcuticular suture. Both wounds were treated with LiquiBand. The patient appeared to tolerate the procedure satisfactorily and was then prepared for transfer to the recovery room.  Rochel Brome M.D.

## 2015-02-03 NOTE — Discharge Instructions (Signed)
Take Tylenol if needed for pain.  May shower.  Anticipate beginning chemotherapy next week.  No appointment needed in the surgery office in the next 1 or 2 weeks.AMBULATORY SURGERY  DISCHARGE INSTRUCTIONS   1) The drugs that you were given will stay in your system until tomorrow so for the next 24 hours you should not:  A) Drive an automobile B) Make any legal decisions C) Drink any alcoholic beverage   2) You may resume regular meals tomorrow.  Today it is better to start with liquids and gradually work up to solid foods.  You may eat anything you prefer, but it is better to start with liquids, then soup and crackers, and gradually work up to solid foods.   3) Please notify your doctor immediately if you have any unusual bleeding, trouble breathing, redness and pain at the surgery site, drainage, fever, or pain not relieved by medication.    4) Additional Instructions:  Please contact your physician with any problems or Same Day Surgery at [phone], Monday through Friday 6 am to 4 pm, or [place] at [facility] number at [phone].

## 2015-02-03 NOTE — Anesthesia Preprocedure Evaluation (Signed)
Anesthesia Evaluation  Patient identified by MRN, date of birth, ID band Patient awake    Reviewed: Allergy & Precautions, H&P , NPO status , Patient's Chart, lab work & pertinent test results, reviewed documented beta blocker date and time   History of Anesthesia Complications Negative for: history of anesthetic complications  Airway Mallampati: III  TM Distance: >3 FB Neck ROM: full    Dental no notable dental hx. (+) Poor Dentition   Pulmonary sleep apnea and Continuous Positive Airway Pressure Ventilation , neg COPDneg recent URI, former smoker,  breath sounds clear to auscultation  Pulmonary exam normal       Cardiovascular Exercise Tolerance: Good hypertension, On Medications and On Home Beta Blockers - angina+ CAD, + Past MI and + Cardiac Stents (Last one was 10 years ago, 4 total) - CABG Normal cardiovascular exam- dysrhythmias - Valvular Problems/MurmursRhythm:regular Rate:Normal     Neuro/Psych negative neurological ROS  negative psych ROS   GI/Hepatic negative GI ROS, Neg liver ROS,   Endo/Other  neg diabetesMorbid obesity  Renal/GU negative Renal ROS  negative genitourinary   Musculoskeletal   Abdominal   Peds  Hematology negative hematology ROS (+)   Anesthesia Other Findings Past Medical History:   Hypertension                                                 Myocardial infarction                                        Breast cancer                                                Cancer of right breast metastatic to brain      11/21/2014    Breast cancer metastasized to bone              11/21/2014    Anginal pain                                                 Sleep apnea                                                  Anemia                                                       Reproductive/Obstetrics negative OB ROS                             Anesthesia  Physical Anesthesia Plan  ASA: III  Anesthesia Plan: General   Post-op Pain Management:    Induction:   Airway  Management Planned:   Additional Equipment:   Intra-op Plan:   Post-operative Plan:   Informed Consent: I have reviewed the patients History and Physical, chart, labs and discussed the procedure including the risks, benefits and alternatives for the proposed anesthesia with the patient or authorized representative who has indicated his/her understanding and acceptance.   Dental Advisory Given  Plan Discussed with: Anesthesiologist, CRNA and Surgeon  Anesthesia Plan Comments:         Anesthesia Quick Evaluation

## 2015-02-03 NOTE — Progress Notes (Signed)
preop I-stat potassium 3.9

## 2015-02-04 ENCOUNTER — Encounter
Admission: RE | Admit: 2015-02-04 | Discharge: 2015-02-04 | Disposition: A | Discharge status: Home / Self Care | Payer: BC Managed Care – PPO | Source: Ambulatory Visit | Attending: Oncology | Admitting: Oncology

## 2015-02-04 DIAGNOSIS — C50911 Malignant neoplasm of unspecified site of right female breast: Secondary | ICD-10-CM | POA: Diagnosis not present

## 2015-02-04 DIAGNOSIS — C7951 Secondary malignant neoplasm of bone: Secondary | ICD-10-CM | POA: Diagnosis present

## 2015-02-04 MED ORDER — TECHNETIUM TC 99M MEDRONATE IV KIT
25.0000 | PACK | Freq: Once | INTRAVENOUS | Status: AC | PRN
Start: 2015-02-04 — End: 2015-02-04
  Administered 2015-02-04: 24.15 via INTRAVENOUS

## 2015-02-04 NOTE — Anesthesia Postprocedure Evaluation (Signed)
  Anesthesia Post-op Note  Patient: [name]  Procedure(s) Performed: Procedure(s): INSERTION PORT-A-CATH (Right)  Anesthesia type:General  Patient location: PACU  Post pain: Pain level controlled  Post assessment: Post-op Vital signs reviewed, Patient's Cardiovascular Status Stable, Respiratory Function Stable, Patent Airway and No signs of Nausea or vomiting  Post vital signs: Reviewed and stable  Last Vitals:  Filed Vitals:   02/03/15 1704  BP: 106/61  Pulse: 88  Temp:   Resp: 16    Level of consciousness: awake, alert  and patient cooperative  Complications: No apparent anesthesia complications

## 2015-02-05 ENCOUNTER — Ambulatory Visit
Admission: RE | Admit: 2015-02-05 | Discharge: 2015-02-05 | Disposition: A | Discharge status: Home / Self Care | Payer: BC Managed Care – PPO | Source: Ambulatory Visit | Attending: Oncology | Admitting: Oncology

## 2015-02-05 ENCOUNTER — Encounter: Payer: Self-pay | Admitting: *Deleted

## 2015-02-05 ENCOUNTER — Ambulatory Visit: Payer: BC Managed Care – PPO | Admitting: Oncology

## 2015-02-05 ENCOUNTER — Inpatient Hospital Stay: Payer: BC Managed Care – PPO

## 2015-02-05 ENCOUNTER — Encounter: Payer: Self-pay | Admitting: Oncology

## 2015-02-05 ENCOUNTER — Other Ambulatory Visit: Payer: BC Managed Care – PPO

## 2015-02-05 ENCOUNTER — Inpatient Hospital Stay (HOSPITAL_BASED_OUTPATIENT_CLINIC_OR_DEPARTMENT_OTHER): Payer: BC Managed Care – PPO | Admitting: Oncology

## 2015-02-05 VITALS — BP 133/86 | HR 103 | Temp 98.4°F | Ht 64.0 in | Wt 343.5 lb

## 2015-02-05 DIAGNOSIS — C50911 Malignant neoplasm of unspecified site of right female breast: Secondary | ICD-10-CM | POA: Insufficient documentation

## 2015-02-05 DIAGNOSIS — I252 Old myocardial infarction: Secondary | ICD-10-CM

## 2015-02-05 DIAGNOSIS — D649 Anemia, unspecified: Secondary | ICD-10-CM

## 2015-02-05 DIAGNOSIS — Z9221 Personal history of antineoplastic chemotherapy: Secondary | ICD-10-CM

## 2015-02-05 DIAGNOSIS — C7951 Secondary malignant neoplasm of bone: Principal | ICD-10-CM

## 2015-02-05 DIAGNOSIS — I10 Essential (primary) hypertension: Secondary | ICD-10-CM

## 2015-02-05 DIAGNOSIS — G4730 Sleep apnea, unspecified: Secondary | ICD-10-CM

## 2015-02-05 DIAGNOSIS — Z7982 Long term (current) use of aspirin: Secondary | ICD-10-CM

## 2015-02-05 DIAGNOSIS — C7970 Secondary malignant neoplasm of unspecified adrenal gland: Secondary | ICD-10-CM

## 2015-02-05 DIAGNOSIS — Z87891 Personal history of nicotine dependence: Secondary | ICD-10-CM

## 2015-02-05 DIAGNOSIS — Z79899 Other long term (current) drug therapy: Secondary | ICD-10-CM

## 2015-02-05 DIAGNOSIS — R911 Solitary pulmonary nodule: Secondary | ICD-10-CM | POA: Diagnosis not present

## 2015-02-05 DIAGNOSIS — E669 Obesity, unspecified: Secondary | ICD-10-CM

## 2015-02-05 DIAGNOSIS — I890 Lymphedema, not elsewhere classified: Secondary | ICD-10-CM

## 2015-02-05 DIAGNOSIS — Z170 Estrogen receptor positive status [ER+]: Secondary | ICD-10-CM | POA: Diagnosis not present

## 2015-02-05 DIAGNOSIS — R590 Localized enlarged lymph nodes: Secondary | ICD-10-CM | POA: Insufficient documentation

## 2015-02-05 LAB — COMPREHENSIVE METABOLIC PANEL
ALBUMIN: 3.4 g/dL — AB (ref 3.5–5.0)
ALT: 13 U/L — ABNORMAL LOW (ref 14–54)
ANION GAP: 7 (ref 5–15)
AST: 21 U/L (ref 15–41)
Alkaline Phosphatase: 46 U/L (ref 38–126)
BUN: 10 mg/dL (ref 6–20)
CO2: 25 mmol/L (ref 22–32)
CREATININE: 0.65 mg/dL (ref 0.44–1.00)
Calcium: 8.3 mg/dL — ABNORMAL LOW (ref 8.9–10.3)
Chloride: 105 mmol/L (ref 101–111)
GFR calc Af Amer: >60 mL/min (ref >60)
GFR calc non Af Amer: >60 mL/min (ref >60)
GLUCOSE: 156 mg/dL — AB (ref 65–99)
Potassium: 4 mmol/L (ref 3.5–5.1)
Sodium: 137 mmol/L (ref 135–145)
Total Bilirubin: 0.4 mg/dL (ref 0.3–1.2)
Total Protein: 7.6 g/dL (ref 6.5–8.1)

## 2015-02-05 LAB — CBC WITH DIFFERENTIAL/PLATELET
BASOS PCT: 1 %
Basophils Absolute: 0.1 10*3/uL (ref 0–0.1)
Eosinophils Absolute: 0.2 10*3/uL (ref 0–0.7)
Eosinophils Relative: 2 %
HCT: 38.8 % (ref 35.0–47.0)
Hemoglobin: 12.4 g/dL (ref 12.0–16.0)
Lymphocytes Relative: 19 %
Lymphs Abs: 1.7 10*3/uL (ref 1.0–3.6)
MCH: 25.8 pg — AB (ref 26.0–34.0)
MCHC: 32.1 g/dL (ref 32.0–36.0)
MCV: 80.5 fL (ref 80.0–100.0)
Monocytes Absolute: 0.6 10*3/uL (ref 0.2–0.9)
Monocytes Relative: 7 %
NEUTROS PCT: 71 %
Neutro Abs: 6.6 10*3/uL — ABNORMAL HIGH (ref 1.4–6.5)
PLATELETS: 320 10*3/uL (ref 150–440)
RBC: 4.82 MIL/uL (ref 3.80–5.20)
RDW: 17.7 % — ABNORMAL HIGH (ref 11.5–14.5)
WBC: 9.2 10*3/uL (ref 3.6–11.0)

## 2015-02-05 MED ORDER — IOHEXOL 350 MG/ML SOLN
100.0000 mL | Freq: Once | INTRAVENOUS | Status: AC | PRN
  Administered 2015-02-05: 100 mL via INTRAVENOUS

## 2015-02-05 NOTE — Progress Notes (Signed)
02/05/2015 @2 :50pm [name] returns to the [facility] this afternoon for her pre-treatment labs and H&P per Dr. Oliva Bustard. She had her Bone scan performed yesterday, and her CT of chest/abdomen/pelvis with contrast performed this morning. Dr. Oliva Bustard also assessed, measured and photographed the cutaneous skin lesion on her right chest wall - which measures 7cm x 4cm, and per Dr. Oliva Bustard was caused by a fungating breast mass. [name] reports the lesion does drain some and she keeps it covered with dry gauze to contain the drainage. She has extensive lymphedema in her right arm and wears a lymphedema sleeve almost continually. States she has very limited range of motion in her right arm and cannot raise her arm above her shoulder unless assisted due to the extent of her tumor. Questioned patient regarding any peripheral neuropthy in this arm and hand. Reports she does experience some numbness and tingling in her hand and outer forearm from time to time, but states it is not bothersome. Does experience quite a bit of weakness in the arm due to her lymphedema. She denies experiencing any numbness or tingling in her left hand, bilateral feet or any other part of her body. 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 Patient has grade 2 lymphedema; which is not related to any prior therapy or surgical procedures, but is related to her disease process, and characterized by discoloration, leathery texture and limited instrumental ADLs(i.e. writing, opening jars, etc); but not affecting her ability to drive or perform self-care. She does report some mild fatigue and describes it as "not having very much energy." Grade 1 fatigue is unrelated to treatment. [name] completed her baseline patient QOL booklet while in clinic today. She was also instructed in use of the [employer] phone survey system. Access information including her password and personal PIN were provided to patient in  writing, and she demonstrates ability to access the system using these numbers on her cell phone, but her survey is not yet in window. Will call her back tomorrow to make sure she can access the survey, and to let her know which treatment arm she has been randomized to. [name] was given copies of her signed consent forms. She was also given her appointment information and a new business card with my contact information in the event she has any problems or questions.

## 2015-02-05 NOTE — Progress Notes (Signed)
[place]  Telephone:([phone]) [phone]  Fax:([phone]) [phone]     [name] DOB: 06-Feb-1956  MR#: [id number]  UYQ#:[id number]  Patient Care Team: Kirk Ruths, MD as PCP - General (Internal Medicine)  CHIEF COMPLAINT:  Chief Complaint  Patient presents with  . Follow-up    This 59 y.o. female patient presents to the clinic for follow-up metastatic breast cancer.   Chief Complaint/Problem List: 1. Breast cancer see also 07/08/13 note, initially a T4 N2 M1 ER positive HER-2 negative stage IV disease 2. Anemia iron deficiency attributed to prior bleeding from wound C earlier documents 3. Right arm lymphedema prior cellulitis 4. Right breast nonhealing but stable wound 5. 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 Patient has been consented for our protocol for   ERIBULIN versus Taxol. Here for further follow-up and treatment consideration  INTERVAL HISTORY:  59 year old lady with locally advanced carcinoma of breast which is ER positive PR positive HER-2/neu negative disease.  Patient has been on high brands and Faslodex.  Tumor is progressing.  Had a CT scan of chest done Here for further follow-up.  This is a transfer of care as patient old oncologist retired. Patient is with the family.  No nausea.  No vomiting.  No bony pain appetite has been stable.  REVIEW OF SYSTEMS:   Review of Systems  Constitutional: Negative for fever, chills, weight loss, malaise/fatigue and diaphoresis.       Right arm lymphedema  HENT: Negative for congestion, ear discharge, ear pain, hearing loss, nosebleeds, sore throat and tinnitus.   Eyes: Negative for blurred vision, double vision, photophobia, pain, discharge and redness.  Respiratory: Negative for cough, hemoptysis, sputum production, shortness of breath, wheezing and stridor.   Cardiovascular: Negative for chest pain,  palpitations, orthopnea, claudication, leg swelling and PND.  Gastrointestinal: Negative for heartburn, nausea, vomiting, abdominal pain, diarrhea, constipation, blood in stool and melena.  Genitourinary: Negative.   Musculoskeletal: Negative.   Skin: Negative.   Neurological: Negative for dizziness, tingling, focal weakness, seizures, weakness and headaches.  Endo/Heme/Allergies: Does not bruise/bleed easily.  Psychiatric/Behavioral: Negative for depression. The patient is not nervous/anxious and does not have insomnia.     As per HPI. Otherwise, a complete review of systems is negatve.  ONCOLOGY HISTORY: Oncology History   1. 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 Previously noted long-term bleeding from the wound of right breast associated with cancer. 2. Patient has extensive right arm lymphedema. Patient is limited in range of motion due to lymphedema and pain in right arm. 3. Patient was started on Xgeva and letrozole in January 2015, [name] was started in April 2015. 4. CEA at lowest 7.4, failure of Ibrance and letrozole with steady rise in CEA. Most recent CEA in May 2016 reported as 223.    6.[place] XGEVA FIRST Elliott 06/2013 LETRAZOLE 07/12/13  IBRANCE 10/10/13. ibrance 12/27/13..cea lowest at 7.4, but failure of ibrance and letrozole as steady rise since     Breast cancer metastasized to bone   06/27/2013 Initial Diagnosis Breast cancer metastasized to bone    Breast CA   01/28/2015 Initial Diagnosis Breast CA    PAST MEDICAL HISTORY: Past Medical History  Diagnosis Date  . Hypertension   . Myocardial infarction   . Breast cancer   .  Breast cancer metastasized to bone 11/21/2014  . Anginal pain   . Sleep apnea   . Anemia     PAST SURGICAL HISTORY: Past Surgical History  Procedure Laterality Date  . Coronary angioplasty with stent placement N/A   . Hernia repair       umbilical hernia repair  . Portacath placement Right 02/03/2015    Procedure: INSERTION PORT-A-CATH;  Surgeon: [name], MD;  Location: [facility] ORS;  Service: General;  Laterality: Right;    FAMILY HISTORY There is no significant family history of breast cancer, ovarian cancer, colon cancer    ADVANCED DIRECTIVES:  Patient does not have any living will or healthcare power of attorney.  Information was given .  Available resources had been discussed.  We will follow-up on subsequent appointments regarding this issue  HEALTH MAINTENANCE: Social History  Substance Use Topics  . Smoking status: Former Smoker -- 1.00 packs/day    Types: Cigarettes    Quit date: 10/25/2004  . Smokeless tobacco: Never Used     Comment: stopped smoking greater than 7 years ago  . Alcohol Use: No     Colonoscopy:  PAP:  Bone density:  Lipid panel:  Allergies  Allergen Reactions  . No Known Allergies     Current Outpatient Prescriptions  Medication Sig Dispense Refill  . acetaminophen (TYLENOL) 500 MG tablet Take 1,000 mg by mouth every 6 (six) hours as needed.    Marland Kitchen aspirin EC 81 MG tablet Take 81 mg by mouth daily.    . clopidogrel (PLAVIX) 75 MG tablet Take 75 mg by mouth daily.    . [employer] CALCIUM 600+D 600-800 MG-UNIT TABS Take 1 tablet by mouth daily. At noon  3  . furosemide (LASIX) 20 MG tablet Take 20 mg by mouth daily.  1  . lidocaine-prilocaine (EMLA) cream Apply 1 application topically as needed. 30 g 3  . lisinopril (PRINIVIL,ZESTRIL) 20 MG tablet Take 20 mg by mouth daily.    . metoprolol succinate (TOPROL-XL) 50 MG 24 hr tablet Take 50 mg by mouth 2 (two) times daily. Take with or immediately following a meal.    . omega-3 acid ethyl esters (LOVAZA) 1 G capsule Take 1 g by mouth at bedtime.    Marland Kitchen PHOSPHA 250 NEUTRAL [id number] MG tablet TAKE 1 TABLET BY MOUTH TWICE A DAY FOR 30 DAYS 60 tablet 2  . potassium chloride (K-DUR) 10 MEQ tablet Take 10 mEq by mouth daily.   1  .  simvastatin (ZOCOR) 80 MG tablet Take 80 mg by mouth at bedtime.    . fexofenadine (ALLEGRA) 180 MG tablet Take 180 mg by mouth at bedtime.     No current facility-administered medications for this visit.    OBJECTIVE: BP 133/86 mmHg  Pulse 103  Temp(Src) 98.4 F (36.9 C) (Tympanic)  Ht 5' 4"  (1.626 m)  Wt 343 lb 7.6 oz (155.8 kg)  BMI 58.93 kg/m2   Body mass index is 58.93 kg/(m^2).    ECOG FS:0 - Asymptomatic  General: Well-developed, well-nourished, no acute distress. Eyes: Pink conjunctiva, anicteric sclera. HEENT: Normocephalic, moist mucous membranes, clear oropharnyx. Lungs: Clear to auscultation bilaterally. Heart: Regular rate and rhythm. No rubs, murmurs, or gallops. Abdomen: Soft, nontender, nondistended. No organomegaly noted, normoactive bowel sounds. Musculoskeletal: No edema, cyanosis, or clubbing. Neuro: Alert, answering all questions appropriately. Cranial nerves grossly intact. Skin: No rashes or petechiae noted. Psych: Normal affect. Lymphatics: No cervical, calvicular, axillary or inguinal LAD.  Palpable lymph node Chest wall  area :NODULAR  lesion is approximately 7 x 4 cm with multiple nodules around the corner with ulcerated lesion. LAB RESULTS: Component     Latest Ref Rng 01/30/2014 02/26/2014 03/20/2014 04/24/2014 05/13/2014  CEA     0.0 - 4.7 ng/mL 15.4 (H) 31.4 (H) 33.5 (H) 66.4 (H) 73.7 (H)   Component     Latest Ref Rng 06/05/2014 08/01/2014 10/02/2014 10/30/2014 12/25/2014  CEA     0.0 - 4.7 ng/mL 75.8 (H) 91.8 (H) 173.1 (H) 223.1 (H) 367.2 (H)            Appointment on 02/05/2015  Component Date Value Ref Range Status  . WBC 02/05/2015 9.2  3.6 - 11.0 K/uL Final  . RBC 02/05/2015 4.82  3.80 - 5.20 MIL/uL Final  . Hemoglobin 02/05/2015 12.4  12.0 - 16.0 g/dL Final  . HCT 02/05/2015 38.8  35.0 - 47.0 % Final  . MCV 02/05/2015 80.5  80.0 - 100.0 fL Final  . MCH 02/05/2015 25.8* 26.0 - 34.0 pg Final  . MCHC 02/05/2015 32.1  32.0 - 36.0 g/dL Final  .  RDW 02/05/2015 17.7* 11.5 - 14.5 % Final  . Platelets 02/05/2015 320  150 - 440 K/uL Final  . Neutrophils Relative % 02/05/2015 71   Final  . Neutro Abs 02/05/2015 6.6* 1.4 - 6.5 K/uL Final  . Lymphocytes Relative 02/05/2015 19   Final  . Lymphs Abs 02/05/2015 1.7  1.0 - 3.6 K/uL Final  . Monocytes Relative 02/05/2015 7   Final  . Monocytes Absolute 02/05/2015 0.6  0.2 - 0.9 K/uL Final  . Eosinophils Relative 02/05/2015 2   Final  . Eosinophils Absolute 02/05/2015 0.2  0 - 0.7 K/uL Final  . Basophils Relative 02/05/2015 1   Final  . Basophils Absolute 02/05/2015 0.1  0 - 0.1 K/uL Final  . Sodium 02/05/2015 137  135 - 145 mmol/L Final  . Potassium 02/05/2015 4.0  3.5 - 5.1 mmol/L Final  . Chloride 02/05/2015 105  101 - 111 mmol/L Final  . CO2 02/05/2015 25  22 - 32 mmol/L Final  . Glucose, Bld 02/05/2015 156* 65 - 99 mg/dL Final  . BUN 02/05/2015 10  6 - 20 mg/dL Final  . Creatinine, Ser 02/05/2015 0.65  0.44 - 1.00 mg/dL Final  . Calcium 02/05/2015 8.3* 8.9 - 10.3 mg/dL Final  . Total Protein 02/05/2015 7.6  6.5 - 8.1 g/dL Final  . Albumin 02/05/2015 3.4* 3.5 - 5.0 g/dL Final  . AST 02/05/2015 21  15 - 41 U/L Final  . ALT 02/05/2015 13* 14 - 54 U/L Final  . Alkaline Phosphatase 02/05/2015 46  38 - 126 U/L Final  . Total Bilirubin 02/05/2015 0.4  0.3 - 1.2 mg/dL Final  . GFR calc non Af Amer 02/05/2015 >60  >60 mL/min Final  . GFR calc Af Amer 02/05/2015 >60  >60 mL/min Final   Comment: (NOTE) The eGFR has been calculated using the CKD EPI equation. This calculation has not been validated in all clinical situations. eGFR's persistently <60 mL/min signify possible Chronic Kidney Disease.   . Anion gap 02/05/2015 7  5 - 15 Final  Admission on 02/03/2015, Discharged on 02/03/2015  Component Date Value Ref Range Status  . Potassium, serum 02/03/2015 3.9   Final  ct sxcan  scan of abdomen on January 15, 2015 Extensive tumor involving in the right shoulder, right lateral chest wall, and  right axilla (series 2/images 8, 9, 14, 19, and 27). Representative measurements include:  --10.0  x 6.2 cm in the right lateral chest wall (series 2/ image 19), previously 9.5 x 5.4 cm  --5.6 x 4.2 cm in the right posterior shoulder/ back (series 2/image 8), previously 4.1 x 4.4 cm  Right superior axillary node measuring 15 mm short axis (series 2/ image 10) and right supraclavicular nodes measuring up to 13 mm short axis (series 2/image 4).   ASSESSMENT:  Stage IV breast cancer with bone metastases, adrenal metastasis. Progressive disease by CT scan criteria and tumor markers   We had prolonged discussion with patient.  All the lab data and x-ray data has been reviewed.  Patient will be randomized to our available protocols ERIBULIN versus Taxol. Informed consent regarding protocol was obtained. All the side effects of chemotherapy including myelosuppression, alopecia, nausea vomiting fatigue weakness.  Secondary infection, and   peripheral neuropathy .  Has been discussed in details. Informal consent has been obtained and will be documented by nurses in the chartIntent of chemotherapy is palliation and relief in symptoms and extending survivalPatient was explained all the side effects of chemotherapy.  And informed consent has been obtained All the questions of patient had been answered. All lab data has been reviewed   Forest Gleason, MD   02/05/2015 7:10 PM

## 2015-02-05 NOTE — Progress Notes (Signed)
Patient does not have living will.  Former smoker. 

## 2015-02-09 ENCOUNTER — Encounter: Payer: Self-pay | Admitting: *Deleted

## 2015-02-09 ENCOUNTER — Inpatient Hospital Stay: Payer: BC Managed Care – PPO

## 2015-02-09 ENCOUNTER — Encounter (INDEPENDENT_AMBULATORY_CARE_PROVIDER_SITE_OTHER): Payer: Self-pay

## 2015-02-09 VITALS — BP 138/84 | HR 84 | Temp 96.9°F | Resp 20

## 2015-02-09 DIAGNOSIS — C7951 Secondary malignant neoplasm of bone: Principal | ICD-10-CM

## 2015-02-09 DIAGNOSIS — C50911 Malignant neoplasm of unspecified site of right female breast: Secondary | ICD-10-CM

## 2015-02-09 MED ORDER — DEXAMETHASONE SODIUM PHOSPHATE 100 MG/10ML IJ SOLN
Freq: Once | INTRAMUSCULAR | Status: AC
  Administered 2015-02-09: 11:00:00 via INTRAVENOUS
  Filled 2015-02-09: qty 4

## 2015-02-09 MED ORDER — PACLITAXEL CHEMO INJECTION 300 MG/50ML
90.0000 mg/m2 | Freq: Once | INTRAVENOUS | Status: DC
  Filled 2015-02-09: qty 40

## 2015-02-09 MED ORDER — HEPARIN SOD (PORK) LOCK FLUSH 100 UNIT/ML IV SOLN
500.0000 [IU] | Freq: Once | INTRAVENOUS | Status: AC | PRN
  Administered 2015-02-09: 500 [IU]
  Filled 2015-02-09: qty 5

## 2015-02-09 MED ORDER — FAMOTIDINE IN NACL 20-0.9 MG/50ML-% IV SOLN: 20.0000 mg | Freq: Once | INTRAVENOUS | Status: DC

## 2015-02-09 MED ORDER — DIPHENHYDRAMINE HCL 50 MG/ML IJ SOLN: 50.0000 mg | Freq: Once | INTRAMUSCULAR | Status: DC

## 2015-02-09 MED ORDER — ONDANSETRON HCL 8 MG PO TABS: 8.0000 mg | ORAL_TABLET | Freq: Two times a day (BID) | ORAL | Status: DC

## 2015-02-09 MED ORDER — PACLITAXEL CHEMO INJECTION 300 MG/50ML
90.0000 mg/m2 | Freq: Once | INTRAVENOUS | Status: AC
  Administered 2015-02-09: 240 mg via INTRAVENOUS
  Filled 2015-02-09: qty 40

## 2015-02-09 MED ORDER — FAMOTIDINE IN NACL 20-0.9 MG/50ML-% IV SOLN
20.0000 mg | Freq: Once | INTRAVENOUS | Status: AC
  Administered 2015-02-09: 20 mg via INTRAVENOUS
  Filled 2015-02-09: qty 50

## 2015-02-09 MED ORDER — SODIUM CHLORIDE 0.9 % IJ SOLN
10.0000 mL | INTRAMUSCULAR | Status: DC | PRN
  Administered 2015-02-09: 10 mL
  Filled 2015-02-09: qty 10

## 2015-02-09 MED ORDER — SODIUM CHLORIDE 0.9 % IV SOLN
Freq: Once | INTRAVENOUS | Status: AC
  Administered 2015-02-09: 11:00:00 via INTRAVENOUS
  Filled 2015-02-09: qty 1000

## 2015-02-09 MED ORDER — DIPHENHYDRAMINE HCL 50 MG/ML IJ SOLN
50.0000 mg | Freq: Once | INTRAMUSCULAR | Status: AC
  Administered 2015-02-09: 50 mg via INTRAVENOUS
  Filled 2015-02-09: qty 1

## 2015-02-09 MED ORDER — SODIUM CHLORIDE 0.9 % IV SOLN
Freq: Once | INTRAVENOUS | Status: DC
  Filled 2015-02-09: qty 4

## 2015-02-09 MED ORDER — SODIUM CHLORIDE 0.9 % IV SOLN
Freq: Once | INTRAVENOUS | Status: DC
  Filled 2015-02-09: qty 1000

## 2015-02-09 NOTE — Progress Notes (Signed)
02/09/15 @10 :10am [name] returns to clinic this morning to receive Cycle 1 Day 1 Taxol infusion. She saw Dr. Oliva Bustard for H&P on 02/05/15 and had CBC and Chemistries drawn as well. She denies development of any new problems since her last visit. States she is ready to get started. VS per infusion RN Phylliss Bob as follows:  96.9 - 99 - 20 - 142/83. Dr. Oliva Bustard has reviewed and signed treatment plan. Discussion made with Dr. Oliva Bustard and pharmacy regarding whether the Taxol will infuse over 1 hour per protocol or 3 hours per treatment plan. S/W pharmacist Eston Mould, who also consulted with [place], and both agree that the Taxol should infuse over 1 hour even though it is a large dose. Dr. Oliva Bustard agrees that the Taxol can be infused over 1 hour; and the reason for the 3 hour questions is that the system defaulted to these instructions. The initial dose will be titrated up slowly by the infusion nurse as per [facility] policy to reduce incidence of adverse reaction. KSS  02/09/15 @13 :20pm Patient tolerated her first Taxol infusion well. Informed her that I will call tomorrow to check on her, but that she can cll [place] at any time if she experiences any side effects that she is unable to manage with her at prn meds. Return appts given to patient and reviewed. KSS

## 2015-02-10 ENCOUNTER — Telehealth: Payer: Self-pay | Admitting: *Deleted

## 2015-02-10 NOTE — Telephone Encounter (Signed)
02/10/15 @11 :52am Received t/c from patient returning call I had made earlier to follow up her first chemotherapy infusion with Taxol yesterday. Ms. Nass reports she is doing fine. States her stomach felt a little funny this morning, so she took a nausea pill (Zofran) and has not had any further problems. States "I can't even tell I've had anything right now." Patient also states that she was told by the infusion nurse that she could experience side effects for up to three days following the infusion. Reminded to call me if she does experience additional side effects or if she has questions about anything, and she states she will.

## 2015-02-11 ENCOUNTER — Telehealth: Payer: Self-pay | Admitting: *Deleted

## 2015-02-11 ENCOUNTER — Encounter: Payer: Self-pay | Admitting: *Deleted

## 2015-02-11 NOTE — Telephone Encounter (Addendum)
02/11/2015 @10 :58am T/C to patient this morning to follow up her chemotherapy infusion on 02/09/15. [name] states she feels even better today than she did yesterday. She de ies experiencing any further nausea symptoms and states her appetite is better today as well. She does voice concern that her return appt next week is very early and she is depending on someone else to bring her. She requests a later appointment that day if possible. She is currently scheduled to see the NP at 8:30 on Monday morning and have her Taxol infusion afterward.  02/11/15 @13 :50pm T/C made back to [name] to inform of time change for her appointment on Monday. Instructed patient that her new time will be 9:45am. She reports this will work out much better for her since her sister will be bringing her to that appointment.

## 2015-02-12 ENCOUNTER — Other Ambulatory Visit: Payer: Self-pay | Admitting: *Deleted

## 2015-02-12 MED ORDER — K PHOS MONO-SOD PHOS DI & MONO 155-852-130 MG PO TABS: 250.0000 mg | ORAL_TABLET | Freq: Two times a day (BID) | ORAL | Status: DC

## 2015-02-12 NOTE — Telephone Encounter (Signed)
Escribed

## 2015-02-16 ENCOUNTER — Inpatient Hospital Stay: Payer: BC Managed Care – PPO

## 2015-02-16 ENCOUNTER — Encounter: Payer: Self-pay | Admitting: *Deleted

## 2015-02-16 ENCOUNTER — Other Ambulatory Visit: Payer: BC Managed Care – PPO

## 2015-02-16 ENCOUNTER — Ambulatory Visit: Payer: BC Managed Care – PPO

## 2015-02-16 ENCOUNTER — Inpatient Hospital Stay (HOSPITAL_BASED_OUTPATIENT_CLINIC_OR_DEPARTMENT_OTHER): Payer: BC Managed Care – PPO | Admitting: Family Medicine

## 2015-02-16 ENCOUNTER — Ambulatory Visit: Payer: BC Managed Care – PPO | Admitting: Family Medicine

## 2015-02-16 VITALS — BP 118/74 | HR 92 | Resp 20

## 2015-02-16 DIAGNOSIS — C7951 Secondary malignant neoplasm of bone: Principal | ICD-10-CM

## 2015-02-16 DIAGNOSIS — C50911 Malignant neoplasm of unspecified site of right female breast: Secondary | ICD-10-CM

## 2015-02-16 LAB — COMPREHENSIVE METABOLIC PANEL
ALBUMIN: 3.4 g/dL — AB (ref 3.5–5.0)
ALT: 16 U/L (ref 14–54)
ANION GAP: 10 (ref 5–15)
AST: 25 U/L (ref 15–41)
Alkaline Phosphatase: 42 U/L (ref 38–126)
BILIRUBIN TOTAL: 0.5 mg/dL (ref 0.3–1.2)
BUN: 16 mg/dL (ref 6–20)
CHLORIDE: 103 mmol/L (ref 101–111)
CO2: 22 mmol/L (ref 22–32)
Calcium: 8.6 mg/dL — ABNORMAL LOW (ref 8.9–10.3)
Creatinine, Ser: 0.77 mg/dL (ref 0.44–1.00)
GFR calc Af Amer: >60 mL/min (ref >60)
GFR calc non Af Amer: >60 mL/min (ref >60)
GLUCOSE: 187 mg/dL — AB (ref 65–99)
POTASSIUM: 4 mmol/L (ref 3.5–5.1)
SODIUM: 135 mmol/L (ref 135–145)
TOTAL PROTEIN: 7.4 g/dL (ref 6.5–8.1)

## 2015-02-16 LAB — CBC WITH DIFFERENTIAL/PLATELET
BASOS ABS: 0.1 10*3/uL (ref 0–0.1)
BASOS PCT: 1 %
EOS ABS: 0.2 10*3/uL (ref 0–0.7)
EOS PCT: 4 %
HEMATOCRIT: 37.2 % (ref 35.0–47.0)
Hemoglobin: 12.1 g/dL (ref 12.0–16.0)
Lymphocytes Relative: 22 %
Lymphs Abs: 1.4 10*3/uL (ref 1.0–3.6)
MCH: 26.2 pg (ref 26.0–34.0)
MCHC: 32.5 g/dL (ref 32.0–36.0)
MCV: 80.5 fL (ref 80.0–100.0)
MONO ABS: 0.4 10*3/uL (ref 0.2–0.9)
MONOS PCT: 6 %
Neutro Abs: 4.5 10*3/uL (ref 1.4–6.5)
Neutrophils Relative %: 67 %
PLATELETS: 320 10*3/uL (ref 150–440)
RBC: 4.62 MIL/uL (ref 3.80–5.20)
RDW: 18.5 % — AB (ref 11.5–14.5)
WBC: 6.6 10*3/uL (ref 3.6–11.0)

## 2015-02-16 MED ORDER — FAMOTIDINE IN NACL 20-0.9 MG/50ML-% IV SOLN
20.0000 mg | Freq: Once | INTRAVENOUS | Status: AC
Start: 2015-02-16 — End: 2015-02-16
  Administered 2015-02-16: 20 mg via INTRAVENOUS
  Filled 2015-02-16: qty 50

## 2015-02-16 MED ORDER — SODIUM CHLORIDE 0.9 % IV SOLN
Freq: Once | INTRAVENOUS | Status: AC
  Administered 2015-02-16: 12:00:00 via INTRAVENOUS
  Filled 2015-02-16: qty 4

## 2015-02-16 MED ORDER — HEPARIN SOD (PORK) LOCK FLUSH 100 UNIT/ML IV SOLN
500.0000 [IU] | Freq: Once | INTRAVENOUS | Status: AC | PRN
  Administered 2015-02-16: 500 [IU]
  Filled 2015-02-16: qty 5

## 2015-02-16 MED ORDER — DEXTROSE 5 % IV SOLN
90.0000 mg/m2 | Freq: Once | INTRAVENOUS | Status: AC
  Administered 2015-02-16: 240 mg via INTRAVENOUS
  Filled 2015-02-16: qty 40

## 2015-02-16 MED ORDER — DIPHENHYDRAMINE HCL 50 MG/ML IJ SOLN
50.0000 mg | Freq: Once | INTRAMUSCULAR | Status: AC
  Administered 2015-02-16: 50 mg via INTRAVENOUS
  Filled 2015-02-16: qty 1

## 2015-02-16 MED ORDER — SODIUM CHLORIDE 0.9 % IV SOLN
Freq: Once | INTRAVENOUS | Status: AC
  Administered 2015-02-16: 11:00:00 via INTRAVENOUS
  Filled 2015-02-16: qty 1000

## 2015-02-16 NOTE — Progress Notes (Signed)
02/16/15 @10 :30am [name] returns to clinic this morning for consideration of Cycle 1 Day 8 Taxol infusion. Reports she feels pretty good and denies experiencing any real side effects from the chemotherapy other than constipation. States she had to take a stool softener with a laxative to stimulate a bowel movement, but denies any problems since that point. Reports feeling a little fatigue, but states not really very much more than her baseline fatigue. Reports insomnia x1 night last week; which was the night after she received chemotherapy. Discussed with her that this was likely due to the steroid she received as a pre-med for hr Taxol infusion, and that this might occur again. [name] questions whether it would be OK for her to take some Benadryl to see if it helps her sleep. This was approved by Georgeanne Nim, NP. Questioned patient about any peripheral neuropathy and she denies experiencing this at all. VS are stable for this patient. CBC and MetC values are within acceptable parameters for patient to receive her Taxol per protocol. Will proceed with C1D8 Taxol infusion as planned. AE's with grade and attribution as follows:      Constipation - grade 1; definitely attributed   Fatigue - grade 1; possibly related   Insomnia - grade 1; unrelated to Taxol, likely related to Dexamethasone

## 2015-02-16 NOTE — Progress Notes (Signed)
[place]  [phone]  Fax:(336) [phone]     [name] DOB: April 01, 1956  MR#: [id number]  OTL#:[id number]  Patient Care Team: Kirk Ruths, MD as PCP - General (Internal Medicine)  CHIEF COMPLAINT:  Chief Complaint  Patient presents with  . Follow-up   Patient with a significant past medical history of metastatic breast cancer. Initially at T4 N2 M1 ER PR positive HER-2 negative, stage IV disease. Also noted to have right arm lymphedema as well as a right breast nonhealing wound.  INTERVAL HISTORY:  Patient is here for further evaluation and treatment consideration regarding locally advanced carcinoma of breast. ER/PR positive HER-2 negative disease. Patient was previously on Ibrance and Faslodex at the instruction of previous oncologist Dr. Inez Pilgrim. Now  with progressive disease by tumor markers and PET scan. She overall reports feeling very well. She denies any pain, nausea, vomiting, and numbness or tingling. She does have right lymphedema sleeve on due to swelling. She is for day 8 Taxol today.  REVIEW OF SYSTEMS:   Review of Systems  Constitutional: Negative for fever, chills, weight loss, malaise/fatigue and diaphoresis.       Lymphedema in her right arm  HENT: Negative for congestion, ear discharge, ear pain, hearing loss, nosebleeds, sore throat and tinnitus.   Eyes: Negative for blurred vision, double vision, photophobia, pain, discharge and redness.  Respiratory: Negative for cough, hemoptysis, sputum production, shortness of breath, wheezing and stridor.   Cardiovascular: Negative for chest pain, palpitations, orthopnea, claudication, leg swelling and PND.  Gastrointestinal: Negative for heartburn, nausea, vomiting, abdominal pain, diarrhea, constipation, blood in stool and melena.  Genitourinary: Negative.   Musculoskeletal: Negative.   Skin: Negative.   Neurological: Negative for dizziness, tingling, focal weakness, seizures,  weakness and headaches.  Endo/Heme/Allergies: Does not bruise/bleed easily.  Psychiatric/Behavioral: Negative for depression. The patient is not nervous/anxious and does not have insomnia.     As per HPI. Otherwise, a complete review of systems is negatve.  ONCOLOGY HISTORY: Oncology History   1. 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 Previously noted long-term bleeding from the wound of right breast associated with cancer. 2. Patient has extensive right arm lymphedema. Patient is limited in range of motion due to lymphedema and pain in right arm. 3. Patient was started on Xgeva and letrozole in January 2015, [name] was started in April 2015. 4. CEA at lowest 7.4, failure of Ibrance and letrozole with steady rise in CEA. Most recent CEA in May 2016 reported as 223.    6.[place] XGEVA FIRST [place] 06/2013 LETRAZOLE 07/12/13  IBRANCE 10/10/13. ibrance 12/27/13..cea lowest at 7.4, but failure of ibrance and letrozole as steady rise since     Breast cancer metastasized to bone   06/27/2013 Initial Diagnosis Breast cancer metastasized to bone    Breast CA   01/28/2015 Initial Diagnosis Breast CA    PAST MEDICAL HISTORY: Past Medical History  Diagnosis Date  . Hypertension   . Myocardial infarction   . Breast cancer   . Breast cancer metastasized to bone 11/21/2014  . Anginal pain   . Sleep apnea   . Anemia     PAST SURGICAL HISTORY: Past Surgical History  Procedure Laterality Date  . Coronary angioplasty with stent placement N/A   . Hernia repair      umbilical hernia repair  . Portacath placement Right 02/03/2015  Procedure: INSERTION PORT-A-CATH;  Surgeon: [name], MD;  Location: [facility] ORS;  Service: General;  Laterality: Right;    FAMILY HISTORY Family History  Problem Relation Age of Onset  . Atrial fibrillation Sister   . Hypertension Sister      GYNECOLOGIC HISTORY:  No LMP recorded. Patient is postmenopausal.     ADVANCED DIRECTIVES:    HEALTH MAINTENANCE: Social History  Substance Use Topics  . Smoking status: Former Smoker -- 1.00 packs/day    Types: Cigarettes    Quit date: 10/25/2004  . Smokeless tobacco: Never Used     Comment: stopped smoking greater than 7 years ago  . Alcohol Use: No     Colonoscopy:  PAP:  Bone density:  Lipid panel:  Allergies  Allergen Reactions  . No Known Allergies     Current Outpatient Prescriptions  Medication Sig Dispense Refill  . acetaminophen (TYLENOL) 500 MG tablet Take 1,000 mg by mouth every 6 (six) hours as needed.    Marland Kitchen aspirin EC 81 MG tablet Take 81 mg by mouth daily.    . clopidogrel (PLAVIX) 75 MG tablet Take 75 mg by mouth daily.    . [employer] CALCIUM 600+D 600-800 MG-UNIT TABS Take 1 tablet by mouth daily. At noon  3  . fexofenadine (ALLEGRA) 180 MG tablet Take 180 mg by mouth at bedtime.    . furosemide (LASIX) 20 MG tablet Take 20 mg by mouth daily.  1  . lidocaine-prilocaine (EMLA) cream Apply 1 application topically as needed. 30 g 3  . lisinopril (PRINIVIL,ZESTRIL) 20 MG tablet Take 20 mg by mouth daily.    . metoprolol succinate (TOPROL-XL) 50 MG 24 hr tablet Take 50 mg by mouth 2 (two) times daily. Take with or immediately following a meal.    . omega-3 acid ethyl esters (LOVAZA) 1 G capsule Take 1 g by mouth at bedtime.    . ondansetron (ZOFRAN) 8 MG tablet Take 1 tablet (8 mg total) by mouth 2 (two) times daily. Start the day after chemo for 2 days. Then take as needed for nausea or vomiting. 30 tablet 1  . phosphorus (PHOSPHA 250 NEUTRAL) 155-852-130 MG tablet Take 1 tablet (250 mg total) by mouth 2 (two) times daily. 60 tablet 6  . potassium chloride (K-DUR) 10 MEQ tablet Take 10 mEq by mouth daily.   1  . simvastatin (ZOCOR) 80 MG tablet Take 80 mg by mouth at bedtime.     No current facility-administered medications for this visit.    Facility-Administered Medications Ordered in Other Visits  Medication Dose Route Frequency Provider Last Rate Last Dose  . 0.9 %  sodium chloride infusion   Intravenous Once Forest Gleason, MD        OBJECTIVE: BP 140/91 mmHg  Pulse 114  Temp(Src) 98.5 F (36.9 C) (Tympanic)  Wt 341 lb 2 oz (154.733 kg)   Body mass index is 58.53 kg/(m^2).    ECOG FS:1 - Symptomatic but completely ambulatory  General: Well-developed, well-nourished, no acute distress. Eyes: Pink conjunctiva, anicteric sclera. HEENT: Normocephalic, moist mucous membranes, clear oropharnyx. Lungs: Clear to auscultation bilaterally. Heart: Regular rate and rhythm. No rubs, murmurs, or gallops. Abdomen: Soft, nontender, nondistended. No organomegaly noted, normoactive bowel sounds. Musculoskeletal: Right arm with lymphedema, sleeve on. No other edema, cyanosis, or clubbing. Neuro: Alert, answering all questions appropriately. Cranial nerves grossly intact. Skin: No rashes or petechiae noted. Psych: Normal affect.    LAB RESULTS:  Office Visit on 02/16/2015  Component Date  Value Ref Range Status  . WBC 02/16/2015 6.6  3.6 - 11.0 K/uL Final   A-LINE DRAW  . RBC 02/16/2015 4.62  3.80 - 5.20 MIL/uL Final  . Hemoglobin 02/16/2015 12.1  12.0 - 16.0 g/dL Final  . HCT 02/16/2015 37.2  35.0 - 47.0 % Final  . MCV 02/16/2015 80.5  80.0 - 100.0 fL Final  . MCH 02/16/2015 26.2  26.0 - 34.0 pg Final  . MCHC 02/16/2015 32.5  32.0 - 36.0 g/dL Final  . RDW 02/16/2015 18.5* 11.5 - 14.5 % Final  . Platelets 02/16/2015 320  150 - 440 K/uL Final  . Neutrophils Relative % 02/16/2015 67   Final  . Neutro Abs 02/16/2015 4.5  1.4 - 6.5 K/uL Final  . Lymphocytes Relative 02/16/2015 22   Final  . Lymphs Abs 02/16/2015 1.4  1.0 - 3.6 K/uL Final  . Monocytes Relative 02/16/2015 6   Final  . Monocytes Absolute 02/16/2015 0.4  0.2 - 0.9 K/uL Final  . Eosinophils Relative 02/16/2015 4   Final  . Eosinophils Absolute 02/16/2015 0.2  0 -  0.7 K/uL Final  . Basophils Relative 02/16/2015 1   Final  . Basophils Absolute 02/16/2015 0.1  0 - 0.1 K/uL Final  . Sodium 02/16/2015 135  135 - 145 mmol/L Final  . Potassium 02/16/2015 4.0  3.5 - 5.1 mmol/L Final  . Chloride 02/16/2015 103  101 - 111 mmol/L Final  . CO2 02/16/2015 22  22 - 32 mmol/L Final  . Glucose, Bld 02/16/2015 187* 65 - 99 mg/dL Final  . BUN 02/16/2015 16  6 - 20 mg/dL Final  . Creatinine, Ser 02/16/2015 0.77  0.44 - 1.00 mg/dL Final  . Calcium 02/16/2015 8.6* 8.9 - 10.3 mg/dL Final  . Total Protein 02/16/2015 7.4  6.5 - 8.1 g/dL Final  . Albumin 02/16/2015 3.4* 3.5 - 5.0 g/dL Final  . AST 02/16/2015 25  15 - 41 U/L Final  . ALT 02/16/2015 16  14 - 54 U/L Final  . Alkaline Phosphatase 02/16/2015 42  38 - 126 U/L Final  . Total Bilirubin 02/16/2015 0.5  0.3 - 1.2 mg/dL Final  . GFR calc non Af Amer 02/16/2015 >60  >60 mL/min Final  . GFR calc Af Amer 02/16/2015 >60  >60 mL/min Final   Comment: (NOTE) The eGFR has been calculated using the CKD EPI equation. This calculation has not been validated in all clinical situations. eGFR's persistently <60 mL/min signify possible Chronic Kidney Disease.   . Anion gap 02/16/2015 10  5 - 15 Final    STUDIES: No results found.  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 Breast CA. She is now being seen by Dr. Oliva Bustard. She is currently enrolled in a clinical trial eribulin versus Taxol. Patient understands intent of chemotherapy is palliative in nature. She tolerated day 1 very well with no complaints.  We'll proceed with day 8 Taxol under clinical trial. In depth chart review performed by research RN. She will return in one week for next treatment with Taxol day 15.  Patient expressed understanding and was in agreement with this plan. She also understands that She can call clinic at any time with any questions, concerns, or complaints.   Dr. Oliva Bustard was available for  consultation and review of plan of care for this patient.  Breast cancer metastasized to bone   Staging form: Breast, AJCC 7th Edition     Clinical stage from  06/27/2013: Stage IV (T4, N2, M1) - Signed by [name], NP on 12/25/2014   [name], NP   02/16/2015 3:58 PM

## 2015-02-16 NOTE — Progress Notes (Signed)
Patient here today for follow up.  Offers no complaints.

## 2015-02-23 ENCOUNTER — Encounter: Payer: Self-pay | Admitting: *Deleted

## 2015-02-23 ENCOUNTER — Inpatient Hospital Stay: Payer: BC Managed Care – PPO

## 2015-02-23 VITALS — BP 112/78 | HR 80 | Temp 97.3°F | Resp 20

## 2015-02-23 DIAGNOSIS — C50911 Malignant neoplasm of unspecified site of right female breast: Secondary | ICD-10-CM

## 2015-02-23 DIAGNOSIS — C7951 Secondary malignant neoplasm of bone: Principal | ICD-10-CM

## 2015-02-23 LAB — CBC
HCT: 36.2 % (ref 35.0–47.0)
HEMOGLOBIN: 11.7 g/dL — AB (ref 12.0–16.0)
MCH: 26.2 pg (ref 26.0–34.0)
MCHC: 32.3 g/dL (ref 32.0–36.0)
MCV: 80.9 fL (ref 80.0–100.0)
Platelets: 330 10*3/uL (ref 150–440)
RBC: 4.48 MIL/uL (ref 3.80–5.20)
RDW: 18.4 % — ABNORMAL HIGH (ref 11.5–14.5)
WBC: 4.6 10*3/uL (ref 3.6–11.0)

## 2015-02-23 MED ORDER — PACLITAXEL CHEMO INJECTION 300 MG/50ML
90.0000 mg/m2 | Freq: Once | INTRAVENOUS | Status: AC
  Administered 2015-02-23: 240 mg via INTRAVENOUS
  Filled 2015-02-23: qty 40

## 2015-02-23 MED ORDER — HEPARIN SOD (PORK) LOCK FLUSH 100 UNIT/ML IV SOLN
500.0000 [IU] | Freq: Once | INTRAVENOUS | Status: AC | PRN
  Administered 2015-02-23: 500 [IU]
  Filled 2015-02-23: qty 5

## 2015-02-23 MED ORDER — SODIUM CHLORIDE 0.9 % IV SOLN
Freq: Once | INTRAVENOUS | Status: AC
  Administered 2015-02-23: 10:00:00 via INTRAVENOUS
  Filled 2015-02-23: qty 4

## 2015-02-23 MED ORDER — DIPHENHYDRAMINE HCL 50 MG/ML IJ SOLN
50.0000 mg | Freq: Once | INTRAMUSCULAR | Status: AC
  Administered 2015-02-23: 50 mg via INTRAVENOUS
  Filled 2015-02-23: qty 1

## 2015-02-23 MED ORDER — SODIUM CHLORIDE 0.9 % IV SOLN
Freq: Once | INTRAVENOUS | Status: AC
  Administered 2015-02-23: 10:00:00 via INTRAVENOUS
  Filled 2015-02-23: qty 1000

## 2015-02-23 MED ORDER — FAMOTIDINE IN NACL 20-0.9 MG/50ML-% IV SOLN
20.0000 mg | Freq: Once | INTRAVENOUS | Status: AC
Start: 2015-02-23 — End: 2015-02-23
  Administered 2015-02-23: 20 mg via INTRAVENOUS
  Filled 2015-02-23: qty 50

## 2015-02-23 NOTE — Progress Notes (Signed)
ACCRU Protocol Cycle 1/Day 15: Pt. arrives in clinic today accompanied by her daughter .  The patient is in good spirits.  She reported on her phone survey some mild fatigue.    Today she reports that she feels more energetic over the past 2-3 days.  She feels as if walking has become easier for her.  She has had some insomnia after dosing due to steroids and she was instructed to use Benadryl prn for relief. She denies any further constipation as she takes Colace routinely for control.  She continues to have her baseline neuropathy of the right hand secondary to ongoing lymphedema.  She denies any neuropathy of her left hand or feet.  CBC is within normal parameters.  Her status has been reviewed with Dr.Choksi and she is cleared for today's treatment.    Fatigue-grade 1: possibly related   Higher education careers adviser.

## 2015-03-09 ENCOUNTER — Encounter: Payer: Self-pay | Admitting: Oncology

## 2015-03-09 ENCOUNTER — Inpatient Hospital Stay: Payer: BC Managed Care – PPO

## 2015-03-09 ENCOUNTER — Inpatient Hospital Stay (HOSPITAL_BASED_OUTPATIENT_CLINIC_OR_DEPARTMENT_OTHER): Payer: BC Managed Care – PPO | Admitting: Oncology

## 2015-03-09 ENCOUNTER — Encounter: Payer: Self-pay | Admitting: *Deleted

## 2015-03-09 ENCOUNTER — Inpatient Hospital Stay: Payer: BC Managed Care – PPO | Attending: Oncology

## 2015-03-09 VITALS — BP 151/80 | HR 101 | Temp 98.6°F | Resp 22 | Ht 64.0 in | Wt 344.5 lb

## 2015-03-09 VITALS — BP 110/77 | HR 88 | Resp 20

## 2015-03-09 DIAGNOSIS — C7951 Secondary malignant neoplasm of bone: Secondary | ICD-10-CM

## 2015-03-09 DIAGNOSIS — D649 Anemia, unspecified: Secondary | ICD-10-CM | POA: Diagnosis not present

## 2015-03-09 DIAGNOSIS — C7970 Secondary malignant neoplasm of unspecified adrenal gland: Secondary | ICD-10-CM | POA: Insufficient documentation

## 2015-03-09 DIAGNOSIS — I890 Lymphedema, not elsewhere classified: Secondary | ICD-10-CM

## 2015-03-09 DIAGNOSIS — G4730 Sleep apnea, unspecified: Secondary | ICD-10-CM | POA: Insufficient documentation

## 2015-03-09 DIAGNOSIS — I252 Old myocardial infarction: Secondary | ICD-10-CM

## 2015-03-09 DIAGNOSIS — Z79899 Other long term (current) drug therapy: Secondary | ICD-10-CM

## 2015-03-09 DIAGNOSIS — I10 Essential (primary) hypertension: Secondary | ICD-10-CM | POA: Insufficient documentation

## 2015-03-09 DIAGNOSIS — C50911 Malignant neoplasm of unspecified site of right female breast: Secondary | ICD-10-CM | POA: Diagnosis present

## 2015-03-09 DIAGNOSIS — Z170 Estrogen receptor positive status [ER+]: Secondary | ICD-10-CM | POA: Insufficient documentation

## 2015-03-09 DIAGNOSIS — Z87891 Personal history of nicotine dependence: Secondary | ICD-10-CM | POA: Diagnosis not present

## 2015-03-09 DIAGNOSIS — S21109S Unspecified open wound of unspecified front wall of thorax without penetration into thoracic cavity, sequela: Secondary | ICD-10-CM | POA: Insufficient documentation

## 2015-03-09 DIAGNOSIS — Z7982 Long term (current) use of aspirin: Secondary | ICD-10-CM

## 2015-03-09 LAB — DIFFERENTIAL
BASOS PCT: 1 %
Basophils Absolute: 0.1 10*3/uL (ref 0–0.1)
EOS ABS: 0.2 10*3/uL (ref 0–0.7)
EOS PCT: 2 %
Lymphocytes Relative: 18 %
Lymphs Abs: 1.7 10*3/uL (ref 1.0–3.6)
Monocytes Absolute: 0.8 10*3/uL (ref 0.2–0.9)
Monocytes Relative: 9 %
NEUTROS PCT: 70 %
Neutro Abs: 6.8 10*3/uL — ABNORMAL HIGH (ref 1.4–6.5)

## 2015-03-09 LAB — CBC
HCT: 34.8 % — ABNORMAL LOW (ref 35.0–47.0)
Hemoglobin: 11.2 g/dL — ABNORMAL LOW (ref 12.0–16.0)
MCH: 26.3 pg (ref 26.0–34.0)
MCHC: 32.3 g/dL (ref 32.0–36.0)
MCV: 81.4 fL (ref 80.0–100.0)
PLATELETS: 290 10*3/uL (ref 150–440)
RBC: 4.28 MIL/uL (ref 3.80–5.20)
RDW: 20 % — ABNORMAL HIGH (ref 11.5–14.5)
WBC: 9.4 10*3/uL (ref 3.6–11.0)

## 2015-03-09 LAB — COMPREHENSIVE METABOLIC PANEL
ALK PHOS: 60 U/L (ref 38–126)
ALT: 14 U/L (ref 14–54)
AST: 17 U/L (ref 15–41)
Albumin: 3.2 g/dL — ABNORMAL LOW (ref 3.5–5.0)
Anion gap: 9 (ref 5–15)
BUN: 13 mg/dL (ref 6–20)
CALCIUM: 8.3 mg/dL — AB (ref 8.9–10.3)
CO2: 22 mmol/L (ref 22–32)
CREATININE: 0.65 mg/dL (ref 0.44–1.00)
Chloride: 105 mmol/L (ref 101–111)
Glucose, Bld: 164 mg/dL — ABNORMAL HIGH (ref 65–99)
Potassium: 3.7 mmol/L (ref 3.5–5.1)
Sodium: 136 mmol/L (ref 135–145)
TOTAL PROTEIN: 7.2 g/dL (ref 6.5–8.1)
Total Bilirubin: 0.5 mg/dL (ref 0.3–1.2)

## 2015-03-09 MED ORDER — SODIUM CHLORIDE 0.9 % IV SOLN
Freq: Once | INTRAVENOUS | Status: AC
  Administered 2015-03-09: 11:00:00 via INTRAVENOUS
  Filled 2015-03-09: qty 4

## 2015-03-09 MED ORDER — SODIUM CHLORIDE 0.9 % IV SOLN
Freq: Once | INTRAVENOUS | Status: AC
  Administered 2015-03-09: 11:00:00 via INTRAVENOUS
  Filled 2015-03-09: qty 1000

## 2015-03-09 MED ORDER — FAMOTIDINE IN NACL 20-0.9 MG/50ML-% IV SOLN
20.0000 mg | Freq: Once | INTRAVENOUS | Status: AC
  Administered 2015-03-09: 20 mg via INTRAVENOUS
  Filled 2015-03-09: qty 50

## 2015-03-09 MED ORDER — PACLITAXEL CHEMO INJECTION 300 MG/50ML
90.0000 mg/m2 | Freq: Once | INTRAVENOUS | Status: AC
  Administered 2015-03-09: 240 mg via INTRAVENOUS
  Filled 2015-03-09: qty 40

## 2015-03-09 MED ORDER — DIPHENHYDRAMINE HCL 50 MG/ML IJ SOLN
50.0000 mg | Freq: Once | INTRAMUSCULAR | Status: AC
  Administered 2015-03-09: 50 mg via INTRAVENOUS
  Filled 2015-03-09: qty 1

## 2015-03-09 MED ORDER — HEPARIN SOD (PORK) LOCK FLUSH 100 UNIT/ML IV SOLN
500.0000 [IU] | Freq: Once | INTRAVENOUS | Status: AC | PRN
  Administered 2015-03-09: 500 [IU]
  Filled 2015-03-09: qty 5

## 2015-03-09 MED ORDER — SODIUM CHLORIDE 0.9 % IJ SOLN
10.0000 mL | INTRAMUSCULAR | Status: DC | PRN
  Administered 2015-03-09: 10 mL
  Filled 2015-03-09: qty 10

## 2015-03-09 NOTE — Progress Notes (Signed)
[place]  [phone]  Fax:(336) [phone]     [name] DOB: 05-Apr-1956  MR#: [id number]  AOZ#:[id number]  Patient Care Team: Kirk Ruths, MD as PCP - General (Internal Medicine)  CHIEF COMPLAINT:  No chief complaint on file.   This 59 y.o. female patient presents to the clinic for follow-up metastatic breast cancer.   Chief Complaint/Problem List: 1. Breast cancer see also 07/08/13 note, initially a T4 N2 M1 ER positive HER-2 negative stage IV disease 2. Anemia iron deficiency attributed to prior bleeding from wound C earlier documents 3. Right arm lymphedema prior cellulitis 4. Right breast nonhealing but stable wound 5. 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  INTERVAL HISTORY:  59 year old lady with locally advanced carcinoma of breast which is ER positive PR positive HER-2/neu negative disease.  Patient has been on high brands and Faslodex.  Tumor is progressing.  Had a CT scan of chest done Here for further follow-up.  This is a transfer of care as patient old oncologist retired. Patient is with the family.  No nausea.  No vomiting.  No bony pain appetite has been stable.   January 27, 2015 Patient is here for chemotherapy class but had a number of questions accompanied by sister to discuss options of therapy  March 09, 2015 Patient is here for the next cycle of chemotherapy under protocol.  No significant side effect   Right l upper extremity lymphedema is improving right chest wall wound is improving.  No nausea no vomiting no diarrhea.  No tingling.  No numbness.   REVIEW OF SYSTEMS:   Review of Systems  Constitutional: Negative for fever, chills, weight loss, malaise/fatigue and diaphoresis.       Right arm lymphedema  HENT: Negative for congestion, ear discharge, ear pain, hearing loss, nosebleeds, sore throat and  tinnitus.   Eyes: Negative for blurred vision, double vision, photophobia, pain, discharge and redness.  Respiratory: Negative for cough, hemoptysis, sputum production, shortness of breath, wheezing and stridor.   Cardiovascular: Negative for chest pain, palpitations, orthopnea, claudication, leg swelling and PND.  Gastrointestinal: Negative for heartburn, nausea, vomiting, abdominal pain, diarrhea, constipation, blood in stool and melena.  Genitourinary: Negative.   Musculoskeletal: Negative.   Skin: Negative.   Neurological: Negative for dizziness, tingling, focal weakness, seizures, weakness and headaches.  Endo/Heme/Allergies: Does not bruise/bleed easily.  Psychiatric/Behavioral: Negative for depression. The patient is not nervous/anxious and does not have insomnia.     As per HPI. Otherwise, a complete review of systems is negatve.  ONCOLOGY HISTORY: Oncology History   1. 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 Previously noted long-term bleeding from the wound of right breast associated with cancer. 2. Patient has extensive right arm lymphedema. Patient is limited in range of motion due to lymphedema and pain in right arm. 3. Patient was started on Xgeva and letrozole in January 2015, [name] was started in April 2015. 4. CEA at lowest 7.4, failure of Ibrance and letrozole with steady rise in CEA. Most recent CEA in May 2016 reported as 223.    6.[place] XGEVA FIRST [place] 06/2013 LETRAZOLE 07/12/13  IBRANCE 10/10/13. ibrance 12/27/13..cea lowest at 7.4, but failure of ibrance and letrozole as steady rise since     Breast cancer metastasized  to bone   06/27/2013 Initial Diagnosis Breast cancer metastasized to bone    Breast CA   01/28/2015 Initial Diagnosis Breast CA    PAST MEDICAL HISTORY: Past Medical History  Diagnosis Date  . Hypertension   . Myocardial infarction   .  Breast cancer   . Breast cancer metastasized to bone 11/21/2014  . Anginal pain   . Sleep apnea   . Anemia     PAST SURGICAL HISTORY: Past Surgical History  Procedure Laterality Date  . Coronary angioplasty with stent placement N/A   . Hernia repair      umbilical hernia repair  . Portacath placement Right 02/03/2015    Procedure: INSERTION PORT-A-CATH;  Surgeon: [name], MD;  Location: [facility] ORS;  Service: General;  Laterality: Right;    FAMILY HISTORY There is no significant family history of breast cancer, ovarian cancer, colon cancer    ADVANCED DIRECTIVES:  Patient does not have any living will or healthcare power of attorney.  Information was given .  Available resources had been discussed.  We will follow-up on subsequent appointments regarding this issue  HEALTH MAINTENANCE: Social History  Substance Use Topics  . Smoking status: Former Smoker -- 1.00 packs/day    Types: Cigarettes    Quit date: 10/25/2004  . Smokeless tobacco: Never Used     Comment: stopped smoking greater than 7 years ago  . Alcohol Use: No     Colonoscopy:  PAP:  Bone density:  Lipid panel:  Allergies  Allergen Reactions  . No Known Allergies     Current Outpatient Prescriptions  Medication Sig Dispense Refill  . acetaminophen (TYLENOL) 500 MG tablet Take 1,000 mg by mouth every 6 (six) hours as needed.    Marland Kitchen aspirin EC 81 MG tablet Take 81 mg by mouth daily.    . clopidogrel (PLAVIX) 75 MG tablet Take 75 mg by mouth daily.    . [employer] CALCIUM 600+D 600-800 MG-UNIT TABS Take 1 tablet by mouth daily. At noon  3  . fexofenadine (ALLEGRA) 180 MG tablet Take 180 mg by mouth at bedtime.    . furosemide (LASIX) 20 MG tablet Take 20 mg by mouth daily.  1  . lidocaine-prilocaine (EMLA) cream Apply 1 application topically as needed. 30 g 3  . lisinopril (PRINIVIL,ZESTRIL) 20 MG tablet Take 20 mg by mouth daily.    . metoprolol succinate (TOPROL-XL) 50 MG 24 hr tablet Take 50 mg by mouth 2  (two) times daily. Take with or immediately following a meal.    . omega-3 acid ethyl esters (LOVAZA) 1 G capsule Take 1 g by mouth at bedtime.    . ondansetron (ZOFRAN) 8 MG tablet Take 1 tablet (8 mg total) by mouth 2 (two) times daily. Start the day after chemo for 2 days. Then take as needed for nausea or vomiting. 30 tablet 1  . phosphorus (PHOSPHA 250 NEUTRAL) 155-852-130 MG tablet Take 1 tablet (250 mg total) by mouth 2 (two) times daily. 60 tablet 6  . potassium chloride (K-DUR) 10 MEQ tablet Take 10 mEq by mouth daily.   1  . simvastatin (ZOCOR) 80 MG tablet Take 80 mg by mouth at bedtime.     No current facility-administered medications for this visit.   Facility-Administered Medications Ordered in Other Visits  Medication Dose Route Frequency Provider Last Rate Last Dose  . 0.9 %  sodium chloride infusion   Intravenous Once Forest Gleason, MD        OBJECTIVE:  There were no vitals taken for this visit.   There is no weight on file to calculate BMI.    ECOG FS:0 - Asymptomatic  General: Well-developed, well-nourished, no acute distress. Eyes: Pink conjunctiva, anicteric sclera. HEENT: Normocephalic, moist mucous membranes, clear oropharnyx. Lungs: Clear to auscultation bilaterally. Heart: Regular rate and rhythm. No rubs, murmurs, or gallops. Abdomen: Soft, nontender, nondistended. No organomegaly noted, normoactive bowel sounds. Musculoskeletal: No edema, cyanosis, or clubbing. Neuro: Alert, answering all questions appropriately. Cranial nerves grossly intact. Skin: No rashes or petechiae noted. Psych: Normal affect. Lymphatics: No cervical, calvicular, axillary or inguinal LAD.   LAB RESULTS: Component     Latest Ref Rng 01/30/2014 02/26/2014 03/20/2014 04/24/2014 05/13/2014  CEA     0.0 - 4.7 ng/mL 15.4 (H) 31.4 (H) 33.5 (H) 66.4 (H) 73.7 (H)   Component     Latest Ref Rng 06/05/2014 08/01/2014 10/02/2014 10/30/2014 12/25/2014  CEA     0.0 - 4.7 ng/mL 75.8 (H) 91.8 (H) 173.1 (H)  223.1 (H) 367.2 (H)            No visits with results within 3 Day(s) from this visit. Latest known visit with results is:  Infusion on 02/23/2015  Component Date Value Ref Range Status  . WBC 02/23/2015 4.6  3.6 - 11.0 K/uL Final   A-LINE DRAW  . RBC 02/23/2015 4.48  3.80 - 5.20 MIL/uL Final  . Hemoglobin 02/23/2015 11.7* 12.0 - 16.0 g/dL Final  . HCT 02/23/2015 36.2  35.0 - 47.0 % Final  . MCV 02/23/2015 80.9  80.0 - 100.0 fL Final  . MCH 02/23/2015 26.2  26.0 - 34.0 pg Final  . MCHC 02/23/2015 32.3  32.0 - 36.0 g/dL Final  . RDW 02/23/2015 18.4* 11.5 - 14.5 % Final  . Platelets 02/23/2015 330  150 - 440 K/uL Final  ct sxcan  scan of abdomen on January 15, 2015 Extensive tumor involving in the right shoulder, right lateral chest wall, and right axilla (series 2/images 8, 9, 14, 19, and 27). Representative measurements include:  --10.0 x 6.2 cm in the right lateral chest wall (series 2/ image 19), previously 9.5 x 5.4 cm  --5.6 x 4.2 cm in the right posterior shoulder/ back (series 2/image 8), previously 4.1 x 4.4 cm  Right superior axillary node measuring 15 mm short axis (series 2/ image 10) and right supraclavicular nodes measuring up to 13 mm short axis (series 2/image 4).   ASSESSMENT:  Stage IV breast cancer with bone metastases, adrenal metastasis. Progressive disease by CT scan criteria and tumor markers Patient is continuing chemotherapy.  Significant response on clinical examination.   No significant side effect.  Improvement in the right upper extremity lymphedema and right chest wall mass. Discussed situation with total call nurses.. All lab data has been reviewed. Repeat CEA    Forest Gleason, MD   03/09/2015 8:02 AM

## 2015-03-09 NOTE — Progress Notes (Signed)
03/09/15 @10 :10am [name] returns to clinic this morning for consideration of Cycle 2, Day 1 Taxol infusion. Patient reports she has been doing very well over the past 2 weeks and states she actually feels better since she began the chemotherapy, reporting improved energy level and better ROM in her right arm. States she can tell the lymphedema has improved and she is demonstrating better movement of her arm and fingers this morning. She does report significant, though not complete hair loss and is wearing a hat. Reviewed phone PRO survey with patient and she completed her PRO questionnaire booklet for cycle 2 while in MD office. Dr. Oliva Bustard in to examine her and states he wants to see her again in clinic in 2 weeks due to concern with her cutaneous lesion. States he may refer her to the wound clinic for better management of the lesion as it continues to heal with chemotherapy. CBC and Chemistries are within acceptable parameters to proceed with treatment per protocol. B/P slightly elevated at 151/80, but this is not abnormal for patient, who states her B/P always increases when she sees the doctor and decreases when she gets to the infusion room for treatment. Adverse events with grade and attribution as follows:    Alopecia - grade 1; definitely attributed to Taxol   Lymphedema - grade 2; unrelated

## 2015-03-12 ENCOUNTER — Encounter: Payer: Self-pay | Admitting: *Deleted

## 2015-03-16 ENCOUNTER — Encounter: Payer: Self-pay | Admitting: *Deleted

## 2015-03-16 ENCOUNTER — Other Ambulatory Visit: Payer: BC Managed Care – PPO

## 2015-03-16 ENCOUNTER — Ambulatory Visit: Payer: BC Managed Care – PPO

## 2015-03-16 ENCOUNTER — Inpatient Hospital Stay: Payer: BC Managed Care – PPO

## 2015-03-16 VITALS — BP 122/78 | HR 100 | Temp 97.4°F | Resp 20

## 2015-03-16 DIAGNOSIS — C7951 Secondary malignant neoplasm of bone: Principal | ICD-10-CM

## 2015-03-16 DIAGNOSIS — C50911 Malignant neoplasm of unspecified site of right female breast: Secondary | ICD-10-CM

## 2015-03-16 LAB — CBC WITH DIFFERENTIAL/PLATELET
BASOS ABS: 0.1 10*3/uL (ref 0–0.1)
Basophils Relative: 1 %
Eosinophils Absolute: 0.3 10*3/uL (ref 0–0.7)
Eosinophils Relative: 4 %
HEMATOCRIT: 33.8 % — AB (ref 35.0–47.0)
Hemoglobin: 11 g/dL — ABNORMAL LOW (ref 12.0–16.0)
LYMPHS PCT: 20 %
Lymphs Abs: 1.7 10*3/uL (ref 1.0–3.6)
MCH: 26.5 pg (ref 26.0–34.0)
MCHC: 32.6 g/dL (ref 32.0–36.0)
MCV: 81.4 fL (ref 80.0–100.0)
MONO ABS: 0.4 10*3/uL (ref 0.2–0.9)
Monocytes Relative: 5 %
NEUTROS ABS: 5.7 10*3/uL (ref 1.4–6.5)
Neutrophils Relative %: 70 %
Platelets: 306 10*3/uL (ref 150–440)
RBC: 4.15 MIL/uL (ref 3.80–5.20)
RDW: 20.3 % — ABNORMAL HIGH (ref 11.5–14.5)
WBC: 8.2 10*3/uL (ref 3.6–11.0)

## 2015-03-16 LAB — COMPREHENSIVE METABOLIC PANEL
ALBUMIN: 3.3 g/dL — AB (ref 3.5–5.0)
ALT: 15 U/L (ref 14–54)
ANION GAP: 8 (ref 5–15)
AST: 17 U/L (ref 15–41)
Alkaline Phosphatase: 58 U/L (ref 38–126)
BILIRUBIN TOTAL: 0.4 mg/dL (ref 0.3–1.2)
BUN: 13 mg/dL (ref 6–20)
CHLORIDE: 104 mmol/L (ref 101–111)
CO2: 23 mmol/L (ref 22–32)
Calcium: 8.7 mg/dL — ABNORMAL LOW (ref 8.9–10.3)
Creatinine, Ser: 0.78 mg/dL (ref 0.44–1.00)
GFR calc Af Amer: >60 mL/min (ref >60)
GFR calc non Af Amer: >60 mL/min (ref >60)
GLUCOSE: 189 mg/dL — AB (ref 65–99)
POTASSIUM: 4 mmol/L (ref 3.5–5.1)
SODIUM: 135 mmol/L (ref 135–145)
TOTAL PROTEIN: 7.2 g/dL (ref 6.5–8.1)

## 2015-03-16 MED ORDER — DIPHENHYDRAMINE HCL 50 MG/ML IJ SOLN
50.0000 mg | Freq: Once | INTRAMUSCULAR | Status: AC
  Administered 2015-03-16: 50 mg via INTRAVENOUS
  Filled 2015-03-16: qty 1

## 2015-03-16 MED ORDER — HEPARIN SOD (PORK) LOCK FLUSH 100 UNIT/ML IV SOLN
500.0000 [IU] | Freq: Once | INTRAVENOUS | Status: AC | PRN
  Administered 2015-03-16: 500 [IU]
  Filled 2015-03-16: qty 5

## 2015-03-16 MED ORDER — SODIUM CHLORIDE 0.9 % IV SOLN
Freq: Once | INTRAVENOUS | Status: AC
  Administered 2015-03-16: 11:00:00 via INTRAVENOUS
  Filled 2015-03-16: qty 1000

## 2015-03-16 MED ORDER — SODIUM CHLORIDE 0.9 % IV SOLN
Freq: Once | INTRAVENOUS | Status: AC
  Administered 2015-03-16: 11:00:00 via INTRAVENOUS
  Filled 2015-03-16: qty 4

## 2015-03-16 MED ORDER — FAMOTIDINE IN NACL 20-0.9 MG/50ML-% IV SOLN
20.0000 mg | Freq: Once | INTRAVENOUS | Status: AC
  Administered 2015-03-16: 20 mg via INTRAVENOUS
  Filled 2015-03-16: qty 50

## 2015-03-16 MED ORDER — PACLITAXEL CHEMO INJECTION 300 MG/50ML
90.0000 mg/m2 | Freq: Once | INTRAVENOUS | Status: AC
  Administered 2015-03-16: 240 mg via INTRAVENOUS
  Filled 2015-03-16: qty 40

## 2015-03-16 MED ORDER — SODIUM CHLORIDE 0.9 % IJ SOLN
10.0000 mL | INTRAMUSCULAR | Status: DC | PRN
  Administered 2015-03-16: 10 mL
  Filled 2015-03-16: qty 10

## 2015-03-16 NOTE — Progress Notes (Signed)
03/16/2015 @ [name] returns to clinic this morning for consideration of Cycle 2, Day 8 Taxol infusion. She reports doing very well and denies any new adverse events. States she has better range of motion in her right arm, and this continues to improve with the decreasing lymphedema patient is experiencing. She states she is still losing her hair, but has not yet experienced complete hair loss. Denies fatigue, and reports she is steadily feeling better since beginning the Taxol infusions. Pt also specifically denies any numbness or tingling in fingers or toes during AE assessment, including her right hand and arm, in which she did have some baseline neuropathy due to lymphedema. CBC & chemistry values are within acceptable parameters for patient to receive Taxol today; [place] is 5700. VS stable for patient with HR noted to be 105 per infusion RN; however this is within patient's normal range. Dr. Oliva Bustard notified of above information and states he has already seen labs and signed treatment orders. Ms. Khalsa current AE's with grade and attribution are as follows:    Alopecia - grade 1; definitely attributed to Taxol    Lymphedema - grade 2; unrelated (baseline)

## 2015-03-23 ENCOUNTER — Inpatient Hospital Stay: Payer: BC Managed Care – PPO

## 2015-03-23 ENCOUNTER — Encounter: Payer: Self-pay | Admitting: *Deleted

## 2015-03-23 ENCOUNTER — Encounter: Payer: Self-pay | Admitting: Oncology

## 2015-03-23 ENCOUNTER — Other Ambulatory Visit: Payer: BC Managed Care – PPO

## 2015-03-23 ENCOUNTER — Inpatient Hospital Stay (HOSPITAL_BASED_OUTPATIENT_CLINIC_OR_DEPARTMENT_OTHER): Payer: BC Managed Care – PPO | Admitting: Oncology

## 2015-03-23 ENCOUNTER — Ambulatory Visit: Payer: BC Managed Care – PPO | Admitting: Oncology

## 2015-03-23 ENCOUNTER — Ambulatory Visit: Payer: BC Managed Care – PPO

## 2015-03-23 VITALS — BP 166/101 | HR 102 | Temp 96.4°F | Wt 340.0 lb

## 2015-03-23 DIAGNOSIS — S21109S Unspecified open wound of unspecified front wall of thorax without penetration into thoracic cavity, sequela: Secondary | ICD-10-CM | POA: Diagnosis not present

## 2015-03-23 DIAGNOSIS — D649 Anemia, unspecified: Secondary | ICD-10-CM

## 2015-03-23 DIAGNOSIS — C7970 Secondary malignant neoplasm of unspecified adrenal gland: Secondary | ICD-10-CM

## 2015-03-23 DIAGNOSIS — Z7982 Long term (current) use of aspirin: Secondary | ICD-10-CM

## 2015-03-23 DIAGNOSIS — I890 Lymphedema, not elsewhere classified: Secondary | ICD-10-CM

## 2015-03-23 DIAGNOSIS — C50911 Malignant neoplasm of unspecified site of right female breast: Secondary | ICD-10-CM

## 2015-03-23 DIAGNOSIS — I252 Old myocardial infarction: Secondary | ICD-10-CM

## 2015-03-23 DIAGNOSIS — G4730 Sleep apnea, unspecified: Secondary | ICD-10-CM

## 2015-03-23 DIAGNOSIS — C7951 Secondary malignant neoplasm of bone: Principal | ICD-10-CM

## 2015-03-23 DIAGNOSIS — Z79899 Other long term (current) drug therapy: Secondary | ICD-10-CM

## 2015-03-23 DIAGNOSIS — I10 Essential (primary) hypertension: Secondary | ICD-10-CM

## 2015-03-23 DIAGNOSIS — Z87891 Personal history of nicotine dependence: Secondary | ICD-10-CM

## 2015-03-23 LAB — CBC WITH DIFFERENTIAL/PLATELET
BASOS PCT: 1 %
Basophils Absolute: 0.1 10*3/uL (ref 0–0.1)
EOS ABS: 0.2 10*3/uL (ref 0–0.7)
Eosinophils Relative: 4 %
HCT: 34.3 % — ABNORMAL LOW (ref 35.0–47.0)
HEMOGLOBIN: 11 g/dL — AB (ref 12.0–16.0)
LYMPHS ABS: 1.7 10*3/uL (ref 1.0–3.6)
Lymphocytes Relative: 32 %
MCH: 26.4 pg (ref 26.0–34.0)
MCHC: 32 g/dL (ref 32.0–36.0)
MCV: 82.4 fL (ref 80.0–100.0)
Monocytes Absolute: 0.3 10*3/uL (ref 0.2–0.9)
Monocytes Relative: 6 %
NEUTROS PCT: 57 %
Neutro Abs: 2.9 10*3/uL (ref 1.4–6.5)
Platelets: 276 10*3/uL (ref 150–440)
RBC: 4.16 MIL/uL (ref 3.80–5.20)
RDW: 21.4 % — ABNORMAL HIGH (ref 11.5–14.5)
WBC: 5.2 10*3/uL (ref 3.6–11.0)

## 2015-03-23 MED ORDER — FAMOTIDINE IN NACL 20-0.9 MG/50ML-% IV SOLN
20.0000 mg | Freq: Once | INTRAVENOUS | Status: AC
  Administered 2015-03-23: 20 mg via INTRAVENOUS
  Filled 2015-03-23: qty 50

## 2015-03-23 MED ORDER — DIPHENHYDRAMINE HCL 50 MG/ML IJ SOLN
50.0000 mg | Freq: Once | INTRAMUSCULAR | Status: AC
  Administered 2015-03-23: 50 mg via INTRAVENOUS
  Filled 2015-03-23: qty 1

## 2015-03-23 MED ORDER — HEPARIN SOD (PORK) LOCK FLUSH 100 UNIT/ML IV SOLN
500.0000 [IU] | Freq: Once | INTRAVENOUS | Status: AC | PRN
  Administered 2015-03-23: 500 [IU]
  Filled 2015-03-23: qty 5

## 2015-03-23 MED ORDER — PACLITAXEL CHEMO INJECTION 300 MG/50ML
90.0000 mg/m2 | Freq: Once | INTRAVENOUS | Status: AC
  Administered 2015-03-23: 240 mg via INTRAVENOUS
  Filled 2015-03-23: qty 40

## 2015-03-23 MED ORDER — SODIUM CHLORIDE 0.9 % IV SOLN
Freq: Once | INTRAVENOUS | Status: AC
  Administered 2015-03-23: 12:00:00 via INTRAVENOUS
  Filled 2015-03-23: qty 4

## 2015-03-23 MED ORDER — SODIUM CHLORIDE 0.9 % IV SOLN
Freq: Once | INTRAVENOUS | Status: AC
  Administered 2015-03-23: 11:00:00 via INTRAVENOUS
  Filled 2015-03-23: qty 1000

## 2015-03-23 MED ORDER — SODIUM CHLORIDE 0.9 % IJ SOLN
10.0000 mL | Freq: Once | INTRAMUSCULAR | Status: AC
  Administered 2015-03-23: 10 mL via INTRAVENOUS
  Filled 2015-03-23: qty 10

## 2015-03-23 NOTE — Progress Notes (Signed)
Patient does not have living will.  Former smoker. 

## 2015-03-23 NOTE — Progress Notes (Signed)
[place]  [phone]  [phone]) [phone]     [name] DOB: 1956/06/14  MR#: [id number]  NAT#:[id number]  Patient Care Team: Kirk Ruths, MD as PCP - General (Internal Medicine)  CHIEF COMPLAINT:  Chief Complaint  Patient presents with  . OTHER    This 60 y.o. female patient presents to the clinic for follow-up metastatic breast cancer.   Chief Complaint/Problem List: 1. Breast cancer see also 07/08/13 note, initially a T4 N2 M1 ER positive HER-2 negative stage IV disease 2. Anemia iron deficiency attributed to prior bleeding from wound C earlier documents 3. Right arm lymphedema prior cellulitis 4. Right breast nonhealing but stable wound 5. 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  INTERVAL HISTORY:  59 year old lady with locally advanced carcinoma of breast which is ER positive PR positive HER-2/neu negative disease.  Patient has been on high brands and Faslodex.  Tumor is progressing.  Had a CT scan of chest done Here for further follow-up.  This is a transfer of care as patient old oncologist retired. Patient is with the family.  No nausea.  No vomiting.  No bony pain appetite has been stable.   March 23, 2015 Patient is here for day 15 cycle 2 chemotherapy under the [facility] protocol.  An randomized to receive Taxol chemotherapy.  Tolerating treatment very well.  Noting numbness. Right upper extremity lymphedema has decreased in size. Chest wall wound is healing well. No nausea no vomiting no diarrhea. REVIEW OF SYSTEMS:   ROS Gen. status patient is feeling better.  Right chest wall wound is gradually healing.  There is multiple areas of bleeding.  Right upper extremity swelling has improved.  Lymphedema is getting better.  Patient is here for stage IV carcinoma of breast All other systems have been reviewed (total 12  system) no other abnormality detected.  No evidence of neuropathy. As per HPI. Otherwise, a complete review of systems is negatve.  ONCOLOGY HISTORY: Oncology History   1. 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 Previously noted long-term bleeding from the wound of right breast associated with cancer. 2. Patient has extensive right arm lymphedema. Patient is limited in range of motion due to lymphedema and pain in right arm. 3. Patient was started on Xgeva and letrozole in January 2015, [name] was started in April 2015. 4. CEA at lowest 7.4, failure of Ibrance and letrozole with steady rise in CEA. Most recent CEA in May 2016 reported as 223.    6.[place] XGEVA FIRST [place] 06/2013 LETRAZOLE 07/12/13  IBRANCE 10/10/13. ibrance 12/27/13..cea lowest at 7.4, but failure of ibrance and letrozole as steady rise since     Breast cancer metastasized to bone   06/27/2013 Initial Diagnosis Breast cancer metastasized to bone    Breast CA   01/28/2015 Initial Diagnosis Breast CA    PAST MEDICAL HISTORY: Past Medical History  Diagnosis Date  . Hypertension   . Myocardial infarction   . Breast cancer   . Breast cancer metastasized to bone 11/21/2014  . Anginal pain   . Sleep apnea   . Anemia     PAST SURGICAL HISTORY: Past Surgical History  Procedure Laterality Date  . Coronary angioplasty with stent placement N/A   . Hernia repair  umbilical hernia repair  . Portacath placement Right 02/03/2015    Procedure: INSERTION PORT-A-CATH;  Surgeon: [name], MD;  Location: [facility] ORS;  Service: General;  Laterality: Right;    FAMILY HISTORY There is no significant family history of breast cancer, ovarian cancer, colon cancer    ADVANCED DIRECTIVES:  Patient does not have any living will or healthcare power of attorney.  Information was given .  Available resources had been  discussed.  We will follow-up on subsequent appointments regarding this issue  HEALTH MAINTENANCE: Social History  Substance Use Topics  . Smoking status: Former Smoker -- 1.00 packs/day    Types: Cigarettes    Quit date: 10/25/2004  . Smokeless tobacco: Never Used     Comment: stopped smoking greater than 7 years ago  . Alcohol Use: No       Allergies  Allergen Reactions  . No Known Allergies     Current Outpatient Prescriptions  Medication Sig Dispense Refill  . acetaminophen (TYLENOL) 500 MG tablet Take 1,000 mg by mouth every 6 (six) hours as needed.    Marland Kitchen aspirin EC 81 MG tablet Take 81 mg by mouth daily.    . clopidogrel (PLAVIX) 75 MG tablet Take 75 mg by mouth daily.    . [employer] CALCIUM 600+D 600-800 MG-UNIT TABS Take 1 tablet by mouth daily. At noon  3  . fexofenadine (ALLEGRA) 180 MG tablet Take 180 mg by mouth at bedtime.    . furosemide (LASIX) 20 MG tablet Take 20 mg by mouth daily.  1  . lidocaine-prilocaine (EMLA) cream Apply 1 application topically as needed. 30 g 3  . lisinopril (PRINIVIL,ZESTRIL) 20 MG tablet Take 20 mg by mouth daily.    . metoprolol succinate (TOPROL-XL) 50 MG 24 hr tablet Take 50 mg by mouth 2 (two) times daily. Take with or immediately following a meal.    . omega-3 acid ethyl esters (LOVAZA) 1 G capsule Take 1 g by mouth at bedtime.    . ondansetron (ZOFRAN) 8 MG tablet Take 1 tablet (8 mg total) by mouth 2 (two) times daily. Start the day after chemo for 2 days. Then take as needed for nausea or vomiting. 30 tablet 1  . phosphorus (PHOSPHA 250 NEUTRAL) 155-852-130 MG tablet Take 1 tablet (250 mg total) by mouth 2 (two) times daily. 60 tablet 6  . potassium chloride (K-DUR) 10 MEQ tablet Take 10 mEq by mouth daily.   1  . simvastatin (ZOCOR) 80 MG tablet Take 80 mg by mouth at bedtime.    . enalapril (VASOTEC) 5 MG tablet   2   No current facility-administered medications for this visit.   Facility-Administered Medications Ordered in Other  Visits  Medication Dose Route Frequency Provider Last Rate Last Dose  . heparin lock flush 100 unit/mL  500 Units Intracatheter Once PRN Forest Gleason, MD      . PACLitaxel (TAXOL) 240 mg in dextrose 5 % 250 mL chemo infusion (> 34m/m2)  90 mg/m2 (Treatment Plan Actual) Intravenous Once JForest Gleason MD 290 mL/hr at 03/23/15 1203 240 mg at 03/23/15 1203    OBJECTIVE: BP 166/101 mmHg  Pulse 102  Temp(Src) 96.4 F (35.8 C) (Tympanic)  Wt 340 lb (154.223 kg)   Body mass index is 58.33 kg/(m^2).    ECOG FS:0 - Asymptomatic  General: Well-developed, well-nourished, no acute distress. Eyes: Pink conjunctiva, anicteric sclera. HEENT: Normocephalic, moist mucous membranes, clear oropharnyx. Lungs: Clear to auscultation bilaterally. Heart: Regular rate and rhythm.  No rubs, murmurs, or gallops. Abdomen: Soft, nontender, nondistended. No organomegaly noted, normoactive bowel sounds. Musculoskeletal: No edema, cyanosis, or clubbing. Neuro: Alert, answering all questions appropriately. Cranial nerves grossly intact. Skin: No rashes or petechiae noted. Psych: Normal affect. Lymphatics: No cervical, calvicular, axillary or inguinal LAD.  Right upper extremity lymphedema is gradually getting better.  Is grade 3 lymphedema. Right breast: In duration persist however open wound is gradually healing.  There are multiple lady is a bleeding.   LAB RESULTS: Component     Latest Ref Rng 01/30/2014 02/26/2014 03/20/2014 04/24/2014 05/13/2014  CEA     0.0 - 4.7 ng/mL 15.4 (H) 31.4 (H) 33.5 (H) 66.4 (H) 73.7 (H)   Component     Latest Ref Rng 06/05/2014 08/01/2014 10/02/2014 10/30/2014 12/25/2014  CEA     0.0 - 4.7 ng/mL 75.8 (H) 91.8 (H) 173.1 (H) 223.1 (H) 367.2 (H)            Infusion on 03/23/2015  Component Date Value Ref Range Status  . WBC 03/23/2015 5.2  3.6 - 11.0 K/uL Final   A-LINE DRAW  . RBC 03/23/2015 4.16  3.80 - 5.20 MIL/uL Final  . Hemoglobin 03/23/2015 11.0* 12.0 - 16.0 g/dL Final  . HCT  03/23/2015 34.3* 35.0 - 47.0 % Final  . MCV 03/23/2015 82.4  80.0 - 100.0 fL Final  . MCH 03/23/2015 26.4  26.0 - 34.0 pg Final  . MCHC 03/23/2015 32.0  32.0 - 36.0 g/dL Final  . RDW 03/23/2015 21.4* 11.5 - 14.5 % Final  . Platelets 03/23/2015 276  150 - 440 K/uL Final  . Neutrophils Relative % 03/23/2015 57   Final  . Neutro Abs 03/23/2015 2.9  1.4 - 6.5 K/uL Final  . Lymphocytes Relative 03/23/2015 32   Final  . Lymphs Abs 03/23/2015 1.7  1.0 - 3.6 K/uL Final  . Monocytes Relative 03/23/2015 6   Final  . Monocytes Absolute 03/23/2015 0.3  0.2 - 0.9 K/uL Final  . Eosinophils Relative 03/23/2015 4   Final  . Eosinophils Absolute 03/23/2015 0.2  0 - 0.7 K/uL Final  . Basophils Relative 03/23/2015 1   Final  . Basophils Absolute 03/23/2015 0.1  0 - 0.1 K/uL Final  ct sxcan  scan of abdomen on January 15, 2015 Extensive tumor involving in the right shoulder, right lateral chest wall, and right axilla (series 2/images 8, 9, 14, 19, and 27). Representative measurements include:  --10.0 x 6.2 cm in the right lateral chest wall (series 2/ image 19), previously 9.5 x 5.4 cm  --5.6 x 4.2 cm in the right posterior shoulder/ back (series 2/image 8), previously 4.1 x 4.4 cm  Right superior axillary node measuring 15 mm short axis (series 2/ image 10) and right supraclavicular nodes measuring up to 13 mm short axis (series 2/image 4).   ASSESSMENT:  S tage IV breast cancer with bone metastases, adrenal metastasis. Progressive disease by CT scan criteria and tumor markers Patient is continuing chemotherapy.  Significant response on clinical examination.   No significant side effect.  Improvement in the right upper extremity lymphedema and right chest wall mass. I discussed situation with the research nurse Continuing chemotherapy Patient was also referred to wound clinic to see if further dressing and changing the type of treatment would help for the wound to heal up quickly. Lymphedema  has improved Continue lymphedema management I will protocol requirements   Were fulfilled    Forest Gleason, MD   03/23/2015 12:19 PM

## 2015-03-23 NOTE — Progress Notes (Signed)
03/23/2015 @10 :40am [name] returns to clinic this morning for consideration of Cycle 2 Day 15 Taxol infusion. Patient reports she has been doing very well and has had very few side effects from the Taxol. States she had en episode of constipation that lasted one day this past week, and states she had to take Colace with a laxative to resolve it. Also reports she has lost all of her hair now and questions whether it will grow back while she is on the Taxol inst that it is not likely to grow back as long as she is receiving the Taxol, which caused her to lose it in the first place.Reports some fatigue off and on, but states overall she feels better. Lymphedema continues to improve, and patient demonstrates better ROM in right arm and hand. Dr. Oliva Bustard in to examine patient and states he will refer her to the wound care center for assistance in management of the cutaneous lesion she has on her Right outer chest area. This lesion has decreased in size and depth, but Dr. Oliva Bustard voices concern that there is potential for infection due to location of the lesion. No current signs of infection, but patient is now just covering the area with dry gauze. Ms. Eckman is in agreement with this plan. VS are stable and CBC values are within acceptable parameters for patient to receive C2D15 Taxol infusion as planned. AEs with grade and attribution are as follows:     Alopecia - grade 2; definitely attributed to Taxol Lymphedema - grade 2; unrelated (baseline)    Fatigue - grade 1; definitely related    Constipation - grade 1; likely related

## 2015-03-31 ENCOUNTER — Encounter: Payer: BC Managed Care – PPO | Attending: Surgery | Admitting: Surgery

## 2015-03-31 DIAGNOSIS — Z87891 Personal history of nicotine dependence: Secondary | ICD-10-CM | POA: Insufficient documentation

## 2015-03-31 DIAGNOSIS — S21001A Unspecified open wound of right breast, initial encounter: Secondary | ICD-10-CM | POA: Insufficient documentation

## 2015-03-31 DIAGNOSIS — C50411 Malignant neoplasm of upper-outer quadrant of right female breast: Secondary | ICD-10-CM | POA: Diagnosis not present

## 2015-03-31 DIAGNOSIS — X58XXXA Exposure to other specified factors, initial encounter: Secondary | ICD-10-CM | POA: Insufficient documentation

## 2015-03-31 DIAGNOSIS — I890 Lymphedema, not elsewhere classified: Secondary | ICD-10-CM | POA: Diagnosis not present

## 2015-03-31 DIAGNOSIS — I252 Old myocardial infarction: Secondary | ICD-10-CM | POA: Diagnosis not present

## 2015-03-31 DIAGNOSIS — I10 Essential (primary) hypertension: Secondary | ICD-10-CM | POA: Insufficient documentation

## 2015-04-01 NOTE — Progress Notes (Signed)
Amy, Pearson ([id number]) Visit Report for 03/31/2015 Chief Complaint Document Details Patient Name: [name], [name] 03/31/2015 10:15 Date of Service: AM Medical Record [id number] Number: Patient Account Number: [id number] 04-20-1956 (59 y.o. Treating RN: Montey Hora Date of Birth/Sex: Female) Other Clinician: Primary Care Physician: Frazier Richards Treating Christin Fudge Referring Physician: Forest Gleason Physician/Extender: Suella Grove in Treatment: 0 Information Obtained from: Patient Chief Complaint Patient presents to the wound care center for a consult due non healing wound. the patient has an open wound on the right breast for about 3 years. Electronic Signature(s) Signed: 03/31/2015 11:57:56 AM By: Christin Fudge MD, FACS Entered By: Christin Fudge on 03/31/2015 11:57:55 Sikora, Real Cons ([id number]) -------------------------------------------------------------------------------- HPI Details Patient Name: Amy Felty A. 03/31/2015 10:15 Date of Service: AM Medical Record [id number] Number: Patient Account Number: [id number] 06/01/1956 (59 y.o. Treating RN: Montey Hora Date of Birth/Sex: Female) Other Clinician: Primary Care Physician: Frazier Richards Treating Christin Fudge Referring Physician: Forest Gleason Physician/Extender: Suella Grove in Treatment: 0 History of Present Illness Location: open wound on the right breast Quality: Patient reports experiencing heaviness to affected area(s). Severity: Patient states wound (s) are getting better. Duration: Patient has had the wound for > 3 years prior to seeking treatment at the wound center Timing: Pain in wound is Intermittent (comes and goes Context: The wound occurred when the patient had a breast lump and did not take treatment for this. Modifying Factors: has been taking hormonal therapy and chemotherapy Associated Signs and Symptoms: Patient reports having no foul odor. HPI Description: 59 year old  patient who comes to see as referred by her medical oncologist Dr. 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 She has also had lymphedema of her right arm and has been treated with hormonal treatments since 2014. Her past medical history is significant for hypertension, myocardial infarction, sleep apnea, morbid obesity and has had procedures for coronary angioplasty, medical hernia repair, Port-A-Cath placement. She is a former smoker and has given up smoking in 2006. Patient is on active management with chemotherapy and has been referred to as for management of the wound. Electronic Signature(s) Signed: 03/31/2015 12:02:23 PM By: Christin Fudge MD, FACS Entered By: Christin Fudge on 03/31/2015 12:02:22 Landers, Real Cons ([id number]) -------------------------------------------------------------------------------- Physical Exam Details Patient Name: Amy, Pearson 03/31/2015 10:15 Date of Service: AM Medical Record [id number] Number: Patient Account Number: [id number] 1955-08-25 (59 y.o. Treating RN: Montey Hora Date of Birth/Sex: Female) Other Clinician: Primary Care Physician: Frazier Richards Treating Christin Fudge Referring Physician: Forest Gleason Physician/Extender: Weeks in Treatment: 0 Constitutional . Pulse regular. Respirations normal and unlabored. Afebrile. . Eyes Nonicteric. Reactive to light. Ears, Nose, Mouth, and Throat Lips, teeth, and gums WNL.Marland Kitchen Moist mucosa without lesions . Neck supple and nontender. No palpable supraclavicular or cervical adenopathy. Normal sized without goiter. Respiratory WNL. No retractions.. Cardiovascular Pedal Pulses WNL. No clubbing, cyanosis or edema. Chest she has a large open wound in the upper outer quadrant of her breast including complete erosion  of her nipple.. she has got a frozen axilla with significant amount of lymphedema of the right arm.. Gastrointestinal (GI) Abdomen without masses or tenderness.. No liver or spleen enlargement or tenderness.. Lymphatic No adneopathy. No adenopathy. No adenopathy. Musculoskeletal Adexa without tenderness or enlargement.. Digits and nails w/o clubbing, cyanosis, infection, petechiae, ischemia, or inflammatory conditions.. Integumentary (Hair,  Skin) No suspicious lesions. No crepitus or fluctuance. No peri-wound warmth or erythema. No masses.Marland Kitchen Psychiatric Judgement and insight Intact.. No evidence of depression, anxiety, or agitation.. Notes the wound in the upper outer quadrant and central portion of the right breast is clean with minimal slough and no active tumor necrosis or bleeding. Electronic Signature(s) Signed: 03/31/2015 12:03:40 PM By: Christin Fudge MD, FACS Duque, Real Cons ([id number]) Entered By: Christin Fudge on 03/31/2015 12:03:38 Scheuermann, Real Cons ([id number]) -------------------------------------------------------------------------------- Physician Orders Details Patient Name: JERZEY, Pearson 03/31/2015 10:15 Date of Service: AM Medical Record [id number] Number: Patient Account Number: [id number] 26-Mar-1956 (59 y.o. Treating RN: Montey Hora Date of Birth/Sex: Female) Other Clinician: Primary Care Physician: Frazier Richards Treating Christin Fudge Referring Physician: Forest Gleason Physician/Extender: Suella Grove in Treatment: 0 Verbal / Phone Orders: Yes Clinician: Montey Hora Read Back and Verified: Yes Diagnosis Coding Wound Cleansing Wound #1 Right Breast o Clean wound with Normal Saline. o May Shower, gently pat wound dry prior to applying new dressing. Primary Wound Dressing Wound #1 Right Breast o Aquacel Ag Secondary Dressing Wound #1 Right Breast o Boardered Foam Dressing Dressing Change Frequency Wound #1 Right Breast o Change  dressing every other day. Follow-up Appointments Wound #1 Right Breast o Return Appointment in 1 week. Electronic Signature(s) Signed: 03/31/2015 12:55:57 PM By: Christin Fudge MD, FACS Signed: 03/31/2015 5:04:53 PM By: Montey Hora Entered By: Montey Hora on 03/31/2015 11:13:46 Everage, Real Cons ([id number]) -------------------------------------------------------------------------------- Problem List Details Patient Name: BASSHEVA, FLURY A. 03/31/2015 10:15 Date of Service: AM Medical Record [id number] Number: Patient Account Number: [id number] 09-May-1956 (59 y.o. Treating RN: Montey Hora Date of Birth/Sex: Female) Other Clinician: Primary Care Physician: Frazier Richards Treating Christin Fudge Referring Physician: Forest Gleason Physician/Extender: Suella Grove in Treatment: 0 Active Problems ICD-10 Encounter Code Description Active Date Diagnosis S21.001A Unspecified open wound of right breast, initial encounter 03/31/2015 Yes C50.411 Malignant neoplasm of upper-outer quadrant of right 03/31/2015 Yes female breast E66.01 Morbid (severe) obesity due to excess calories 03/31/2015 Yes Inactive Problems Resolved Problems Electronic Signature(s) Signed: 03/31/2015 11:57:14 AM By: Christin Fudge MD, FACS Entered By: Christin Fudge on 03/31/2015 11:57:13 Mcdonell, Real Cons ([id number]) -------------------------------------------------------------------------------- Progress Note Details Patient Name: Amy Felty A. 03/31/2015 10:15 Date of Service: AM Medical Record [id number] Number: Patient Account Number: [id number] Nov 14, 1955 (59 y.o. Treating RN: Montey Hora Date of Birth/Sex: Female) Other Clinician: Primary Care Physician: Frazier Richards Treating Christin Fudge Referring Physician: Forest Gleason Physician/Extender: Suella Grove in Treatment: 0 Subjective Chief Complaint Information obtained from Patient Patient presents to the wound care center for a consult due  non healing wound. the patient has an open wound on the right breast for about 3 years. History of Present Illness (HPI) The following HPI elements were documented for the patient's wound: Location: open wound on the right breast Quality: Patient reports experiencing heaviness to affected area(s). Severity: Patient states wound (s) are getting better. Duration: Patient has had the wound for > 3 years prior to seeking treatment at the wound center Timing: Pain in wound is Intermittent (comes and goes Context: The wound occurred when the patient had a breast lump and did not take treatment for this. Modifying Factors: has been taking hormonal therapy and chemotherapy Associated Signs and Symptoms: Patient reports having no foul odor. 59 year old patient who comes to see as referred by her medical oncologist Dr. 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 She has also had lymphedema of her right arm and has been treated with hormonal treatments since 2014. Her past medical history is significant for hypertension, myocardial infarction, sleep apnea, morbid obesity and has had procedures for coronary angioplasty, medical hernia repair, Port-A-Cath placement. She is a former smoker and has given up smoking in 2006. Patient is on active management with chemotherapy and has been referred to as for management of the wound. Wound History Patient presents with 1 open wound that has been present for approximately 8 months. Patient has been treating wound in the following manner: dry packing. The wound has been healed in the past but has re- opened. Laboratory tests have not been performed in the last month. Patient reportedly has not tested positive for an antibiotic resistant organism. Patient reportedly has not tested positive  for osteomyelitis. Patient reportedly has not had testing performed to evaluate circulation in the legs. Patient History Information obtained from Patient. Tom, Saleha A. ([id number]) Allergies No Known Allergies Family History Cancer - Father, Mother, Heart Disease - Paternal Grandparents, No family history of Diabetes, Hereditary Spherocytosis, Hypertension, Kidney Disease, Lung Disease, Seizures, Stroke, Thyroid Problems, Tuberculosis. Social History Former smoker, Marital Status - Married, Alcohol Use - Never, Drug Use - No History, Caffeine Use - Never. Medical History Cardiovascular Patient has history of Hypertension, Myocardial Infarction - 2007 Oncologic Patient has history of Received Chemotherapy - breast cancer, Received Radiation - breast cancer Hospitalization/Surgery History - 06/21/2013, [facility], breast cancer. Review of Systems (ROS) Constitutional Symptoms (General Health) The patient has no complaints or symptoms. Eyes Complains or has symptoms of Glasses / Contacts - glasses. Ear/Nose/Mouth/Throat The patient has no complaints or symptoms. Hematologic/Lymphatic The patient has no complaints or symptoms. Respiratory The patient has no complaints or symptoms. Gastrointestinal The patient has no complaints or symptoms. Endocrine The patient has no complaints or symptoms. Genitourinary The patient has no complaints or symptoms. Immunological The patient has no complaints or symptoms. Integumentary (Skin) The patient has no complaints or symptoms. Musculoskeletal The patient has no complaints or symptoms. Neurologic The patient has no complaints or symptoms. Psychiatric The patient has no complaints or symptoms. Cerritos, Miller A. ([id number]) Medications Calcium 600 + D(3) oral 1 1 tablet oral aspirin 81 mg tablet,delayed release oral 1 1 tablet,delayed release (DR/EC) oral Tylenol Extra Strength 500 mg tablet oral 2 2 tablet oral Allegra Allergy 180  mg tablet oral 1 1 tablet oral Allegra Allergy 180 mg tablet oral 1 1 tablet oral lisinopril 20 mg tablet oral 1 1 tablet oral furosemide 20 mg tablet oral 1 t tablet oral clopidogrel 75 mg tablet oral 1 1 tablet oral lidocaine-prilocaine 2.5 %-2.5 % topical cream topical cream topical Objective Constitutional Pulse regular. Respirations normal and unlabored. Afebrile. Vitals Time Taken: 10:34 AM, Height: 65 in, Source: Stated, Weight: 340 lbs, Source: Stated, BMI: 56.6, Temperature: 98.2 F, Pulse: 102 bpm, Respiratory Rate: 20 breaths/min, Blood Pressure: 135/69 mmHg. Eyes Nonicteric. Reactive to light. Ears, Nose, Mouth, and Throat Lips, teeth, and gums WNL.Marland Kitchen Moist mucosa without lesions . Neck supple and nontender. No palpable supraclavicular or cervical adenopathy. Normal sized without goiter. Respiratory WNL. No retractions.. Cardiovascular Pedal Pulses WNL. No clubbing, cyanosis or edema. Chest she has a large open wound in the upper outer quadrant of her breast including complete erosion of her nipple.. she has got a frozen axilla with significant amount of lymphedema of the right arm.. Gastrointestinal (GI) Abdomen  without masses or tenderness.. No liver or spleen enlargement or tenderness.. Lymphatic No adneopathy. No adenopathy. No adenopathy. Dumont, Delorise A. ([id number]) Musculoskeletal Adexa without tenderness or enlargement.. Digits and nails w/o clubbing, cyanosis, infection, petechiae, ischemia, or inflammatory conditions.Marland Kitchen Psychiatric Judgement and insight Intact.. No evidence of depression, anxiety, or agitation.. General Notes: the wound in the upper outer quadrant and central portion of the right breast is clean with minimal slough and no active tumor necrosis or bleeding. Integumentary (Hair, Skin) No suspicious lesions. No crepitus or fluctuance. No peri-wound warmth or erythema. No masses.. Wound #1 status is Open. Original cause of wound was Gradually  Appeared. The wound is located on the Right Breast. The wound measures 9cm length x 7.3cm width x 0.2cm depth; 51.601cm^2 area and 10.32cm^3 volume. The wound is limited to skin breakdown. There is no tunneling or undermining noted. There is a medium amount of serous drainage noted. The wound margin is flat and intact. There is large (67-100%) red granulation within the wound bed. There is a small (1-33%) amount of necrotic tissue within the wound bed including Eschar. The periwound skin appearance did not exhibit: Callus, Crepitus, Excoriation, Fluctuance, Friable, Induration, Localized Edema, Rash, Scarring, Dry/Scaly, Maceration, Moist, Atrophie Blanche, Cyanosis, Ecchymosis, Hemosiderin Staining, Mottled, Pallor, Rubor, Erythema. Assessment Active Problems ICD-10 S21.001A - Unspecified open wound of right breast, initial encounter C50.411 - Malignant neoplasm of upper-outer quadrant of right female breast E66.01 - Morbid (severe) obesity due to excess calories The right breast which shows signs and symptoms of advanced breast cancer with local ulceration and lymphedema of her right arm is under active treatment by her medical oncologist. We will offer supportive care with silver alginate and a bordered foam dressing to be changed on alternate days. She is already taking treatment for lymphedema and this is slowly subsiding as per the patient. At this stage she has no odor from the wound and hence we will not need any Carboflex pads locally. Lamons, Raela A. ([id number]) All her questions have been answered and she will see is back on regular intervals. Plan Wound Cleansing: Wound #1 Right Breast: Clean wound with Normal Saline. May Shower, gently pat wound dry prior to applying new dressing. Primary Wound Dressing: Wound #1 Right Breast: Aquacel Ag Secondary Dressing: Wound #1 Right Breast: Boardered Foam Dressing Dressing Change Frequency: Wound #1 Right Breast: Change  dressing every other day. Follow-up Appointments: Wound #1 Right Breast: Return Appointment in 1 week. The right breast which shows signs and symptoms of advanced breast cancer with local ulceration and lymphedema of her right arm is under active treatment by her medical oncologist. We will offer supportive care with silver alginate and a bordered foam dressing to be changed on alternate days. She is already taking treatment for lymphedema and this is slowly subsiding as per the patient. At this stage she has no odor from the wound and hence we will not need any Carboflex pads locally. All her questions have been answered and she will see is back on regular intervals. Electronic Signature(s) Signed: 03/31/2015 12:06:01 PM By: Christin Fudge MD, FACS Entered By: Christin Fudge on 03/31/2015 12:06:01 Mehlhoff, Real Cons ([id number]) -------------------------------------------------------------------------------- ROS/PFSH Details Patient Name: Amy Felty A. 03/31/2015 10:15 Date of Service: AM Medical Record [id number] Number: Patient Account Number: [id number] 1955-08-09 (59 y.o. Treating RN: Montey Hora Date of Birth/Sex: Female) Other Clinician: Primary Care Physician: Frazier Richards Treating Christin Fudge Referring Physician: Forest Gleason Physician/Extender: Suella Grove in Treatment: 0 Information Obtained From Patient Wound History  Do you currently have one or more open woundso Yes How many open wounds do you currently haveo 1 Approximately how long have you had your woundso 8 months How have you been treating your wound(s) until nowo dry packing Has your wound(s) ever healed and then re-openedo Yes Have you had any lab work done in the past montho No Have you tested positive for an antibiotic resistant organism (MRSA, VRE)o No Have you tested positive for osteomyelitis (bone infection)o No Have you had any tests for circulation on your legso No Eyes Complaints and  Symptoms: Positive for: Glasses / Contacts - glasses Constitutional Symptoms (General Health) Complaints and Symptoms: No Complaints or Symptoms Ear/Nose/Mouth/Throat Complaints and Symptoms: No Complaints or Symptoms Hematologic/Lymphatic Complaints and Symptoms: No Complaints or Symptoms Respiratory Complaints and Symptoms: No Complaints or Symptoms Cardiovascular Spisak, Nalini A. ([id number]) Medical History: Positive for: Hypertension; Myocardial Infarction - 2007 Gastrointestinal Complaints and Symptoms: No Complaints or Symptoms Endocrine Complaints and Symptoms: No Complaints or Symptoms Genitourinary Complaints and Symptoms: No Complaints or Symptoms Immunological Complaints and Symptoms: No Complaints or Symptoms Integumentary (Skin) Complaints and Symptoms: No Complaints or Symptoms Musculoskeletal Complaints and Symptoms: No Complaints or Symptoms Neurologic Complaints and Symptoms: No Complaints or Symptoms Oncologic Medical History: Positive for: Received Chemotherapy - breast cancer; Received Radiation - breast cancer Psychiatric Complaints and Symptoms: No Complaints or Symptoms Hospitalization / Surgery History Name of Hospital Purpose of Hospitalization/Surgery Date [facility] breast cancer 06/21/2013 Giambalvo, Kathlee A. ([id number]) Family and Social History Cancer: Yes - Father, Mother; Diabetes: No; Heart Disease: Yes - Paternal Grandparents; Hereditary Spherocytosis: No; Hypertension: No; Kidney Disease: No; Lung Disease: No; Seizures: No; Stroke: No; Thyroid Problems: No; Tuberculosis: No; Former smoker; Marital Status - Married; Alcohol Use: Never; Drug Use: No History; Caffeine Use: Never; Financial Concerns: No; Food, Clothing or Shelter Needs: No; Support System Lacking: No; Transportation Concerns: No; Advanced Directives: No; Patient does not want information on Advanced Directives Physician Affirmation I have reviewed and agree with the  above information. Electronic Signature(s) Signed: 03/31/2015 10:54:53 AM By: Christin Fudge MD, FACS Signed: 03/31/2015 5:04:53 PM By: Montey Hora Entered By: Christin Fudge on 03/31/2015 10:54:53 Tamplin, Real Cons ([id number]) -------------------------------------------------------------------------------- SuperBill Details Patient Name: Bridge, Akshara A. Date of Service: 03/31/2015 Medical Record Number: [id number] Patient Account Number: [id number] Date of Birth/Sex: [birth year] (59 y.o. Female) Treating RN: Montey Hora Primary Care Physician: Frazier Richards Other Clinician: Referring Physician: Forest Gleason Treating Physician/Extender: Frann Rider in Treatment: 0 Diagnosis Coding ICD-10 Codes Code Description K87.681L Unspecified open wound of right breast, initial encounter C50.411 Malignant neoplasm of upper-outer quadrant of right female breast E66.01 Morbid (severe) obesity due to excess calories Facility Procedures CPT4 Code: [id number] Description: 99214 - WOUND CARE VISIT-LEV 4 EST PT Modifier: Quantity: 1 Physician Procedures CPT4 Code Description: [id number] 84536 - WC PHYS LEVEL 4 - NEW PT ICD-10 Description Diagnosis S21.001A Unspecified open wound of right breast, initial C50.411 Malignant neoplasm of upper-outer quadrant of ri E66.01 Morbid (severe) obesity due to excess  calories Modifier: encounter ght female bre Quantity: 1 ast Electronic Signature(s) Signed: 03/31/2015 12:06:40 PM By: Christin Fudge MD, FACS Entered By: Christin Fudge on 03/31/2015 12:06:39

## 2015-04-01 NOTE — Progress Notes (Signed)
[name] ([id number]) Visit Report for 03/31/2015 Allergy List Details Patient Name: [name], Amy A. Date of Service: 03/31/2015 10:15 AM Medical Record Number: [id number] Patient Account Number: [id number] Date of Birth/Sex: 06-10-1956 (59 y.o. Female) Treating RN: Montey Hora Primary Care Physician: Frazier Richards Other Clinician: Referring Physician: Forest Gleason Treating Physician/Extender: Frann Rider in Treatment: 0 Allergies Active Allergies No Known Allergies Allergy Notes Electronic Signature(s) Signed: 03/31/2015 5:04:53 PM By: Montey Hora Entered By: Montey Hora on 03/31/2015 10:38:33 [name], [name] ([id number]) -------------------------------------------------------------------------------- Arrival Information Details Patient Name: Pearson, Amy A. Date of Service: 03/31/2015 10:15 AM Medical Record Number: [id number] Patient Account Number: [id number] Date of Birth/Sex: Sep 11, 1955 (59 y.o. Female) Treating RN: Montey Hora Primary Care Physician: Frazier Richards Other Clinician: Referring Physician: Forest Gleason Treating Physician/Extender: Frann Rider in Treatment: 0 Visit Information Patient Arrived: Ambulatory Arrival Time: 10:32 Accompanied By: self Transfer Assistance: None Patient Identification Verified: Yes Secondary Verification Process Yes Completed: Patient Has Alerts: Yes Patient Alerts: aspirin 81, plavix Electronic Signature(s) Signed: 03/31/2015 5:04:53 PM By: Montey Hora Entered By: Montey Hora on 03/31/2015 10:33:02 [name] ([id number]) -------------------------------------------------------------------------------- Clinic Level of Care Assessment Details Patient Name: [name], Amy A. Date of Service: 03/31/2015 10:15 AM Medical Record Number: [id number] Patient Account Number: [id number] Date of Birth/Sex: 1955/10/20 (59 y.o. Female) Treating RN: Montey Hora Primary  Care Physician: Frazier Richards Other Clinician: Referring Physician: Forest Gleason Treating Physician/Extender: Frann Rider in Treatment: 0 Clinic Level of Care Assessment Items TOOL 2 Quantity Score []  - Use when only an EandM is performed on the INITIAL visit 0 ASSESSMENTS - Nursing Assessment / Reassessment X - General Physical Exam (combine w/ comprehensive assessment (listed just 1 20 below) when performed on new pt. evals) X - Comprehensive Assessment (HX, ROS, Risk Assessments, Wounds Hx, etc.) 1 25 ASSESSMENTS - Wound and Skin Assessment / Reassessment X - Simple Wound Assessment / Reassessment - one wound 1 5 []  - Complex Wound Assessment / Reassessment - multiple wounds 0 []  - Dermatologic / Skin Assessment (not related to wound area) 0 ASSESSMENTS - Ostomy and/or Continence Assessment and Care []  - Incontinence Assessment and Management 0 []  - Ostomy Care Assessment and Management (repouching, etc.) 0 PROCESS - Coordination of Care X - Simple Patient / Family Education for ongoing care 1 15 []  - Complex (extensive) Patient / Family Education for ongoing care 0 X - Staff obtains Programmer, systems, Records, Test Results / Process Orders 1 10 []  - Staff telephones HHA, Nursing Homes / Clarify orders / etc 0 []  - Routine Transfer to another Facility (non-emergent condition) 0 []  - Routine Hospital Admission (non-emergent condition) 0 X - New Admissions / Biomedical engineer / Ordering NPWT, Apligraf, etc. 1 15 []  - Emergency Hospital Admission (emergent condition) 0 X - Simple Discharge Coordination 1 10 [name], Amy A. ([id number]) []  - Complex (extensive) Discharge Coordination 0 PROCESS - Special Needs []  - Pediatric / Minor Patient Management 0 []  - Isolation Patient Management 0 []  - Hearing / Language / Visual special needs 0 []  - Assessment of Community assistance (transportation, D/C planning, etc.) 0 []  - Additional assistance / Altered mentation 0 []  -  Support Surface(s) Assessment (bed, cushion, seat, etc.) 0 INTERVENTIONS - Wound Cleansing / Measurement X - Wound Imaging (photographs - any number of wounds) 1 5 []  - Wound Tracing (instead of photographs) 0 X - Simple Wound Measurement - one wound 1 5 []  - Complex Wound Measurement - multiple wounds 0 X - Simple  Wound Cleansing - one wound 1 5 []  - Complex Wound Cleansing - multiple wounds 0 INTERVENTIONS - Wound Dressings X - Small Wound Dressing one or multiple wounds 1 10 []  - Medium Wound Dressing one or multiple wounds 0 []  - Large Wound Dressing one or multiple wounds 0 []  - Application of Medications - injection 0 INTERVENTIONS - Miscellaneous []  - External ear exam 0 []  - Specimen Collection (cultures, biopsies, blood, body fluids, etc.) 0 []  - Specimen(s) / Culture(s) sent or taken to Lab for analysis 0 []  - Patient Transfer (multiple staff / Civil Service fast streamer / Similar devices) 0 []  - Simple Staple / Suture removal (25 or less) 0 []  - Complex Staple / Suture removal (26 or more) 0 [name], Amy A. ([id number]) []  - Hypo / Hyperglycemic Management (close monitor of Blood Glucose) 0 []  - Ankle / Brachial Index (ABI) - do not check if billed separately 0 Has the patient been seen at the hospital within the last three years: Yes Total Score: 125 Level Of Care: New/Established - Level 4 Electronic Signature(s) Signed: 03/31/2015 5:04:53 PM By: Montey Hora Entered By: Montey Hora on 03/31/2015 11:09:23 [name] ([id number]) -------------------------------------------------------------------------------- Encounter Discharge Information Details Patient Name: [name], Amy A. Date of Service: 03/31/2015 10:15 AM Medical Record Number: [id number] Patient Account Number: [id number] Date of Birth/Sex: 02-May-1956 (59 y.o. Female) Treating RN: Montey Hora Primary Care Physician: Frazier Richards Other Clinician: Referring Physician: Forest Gleason Treating  Physician/Extender: Frann Rider in Treatment: 0 Encounter Discharge Information Items Discharge Pain Level: 0 Discharge Condition: Stable Ambulatory Status: Ambulatory Discharge Destination: Home Private Transportation: Auto Accompanied By: self Schedule Follow-up Appointment: Yes Medication Reconciliation completed and No provided to Patient/Care Aylah Yeary: Clinical Summary of Care: Electronic Signature(s) Signed: 03/31/2015 5:04:53 PM By: Montey Hora Entered By: Montey Hora on 03/31/2015 11:20:15 [name], [name] Kitchen ([id number]) -------------------------------------------------------------------------------- Multi Wound Chart Details Patient Name: Bernardi, Cosandra A. Date of Service: 03/31/2015 10:15 AM Medical Record Number: [id number] Patient Account Number: [id number] Date of Birth/Sex: [birth year] (59 y.o. Female) Treating RN: Montey Hora Primary Care Physician: Frazier Richards Other Clinician: Referring Physician: Forest Gleason Treating Physician/Extender: Frann Rider in Treatment: 0 Vital Signs Height(in): 65 Pulse(bpm): 102 Weight(lbs): 340 Blood Pressure 135/69 (mmHg): Body Mass Index(BMI): 57 Temperature(F): 98.2 Respiratory Rate 20 (breaths/min): Photos: [1:No Photos] [N/A:N/A] Wound Location: [1:Right Breast] [N/A:N/A] Wounding Event: [1:Gradually Appeared] [N/A:N/A] Primary Etiology: [1:Malignant Wound] [N/A:N/A] Comorbid History: [1:Hypertension, Myocardial Infarction, Received Chemotherapy, Received Radiation] [N/A:N/A] Date Acquired: [1:06/02/2014] [N/A:N/A] Weeks of Treatment: [1:0] [N/A:N/A] Wound Status: [1:Open] [N/A:N/A] Measurements L x W x D 9x7.3x0.2 [N/A:N/A] (cm) Area (cm) : [1:51.601] [N/A:N/A] Volume (cm) : [1:10.32] [N/A:N/A] % Reduction in Area: [1:0.00%] [N/A:N/A] % Reduction in Volume: 0.00% [N/A:N/A] Classification: [1:Full Thickness Without Exposed Support Structures] [N/A:N/A] Exudate Amount:  [1:Medium] [N/A:N/A] Exudate Type: [1:Serous] [N/A:N/A] Exudate Color: [1:amber] [N/A:N/A] Wound Margin: [1:Flat and Intact] [N/A:N/A] Granulation Amount: [1:Large (67-100%)] [N/A:N/A] Granulation Quality: [1:Red] [N/A:N/A] Necrotic Amount: [1:Small (1-33%)] [N/A:N/A] Necrotic Tissue: [1:Eschar] [N/A:N/A] Exposed Structures: [1:Fascia: No Fat: No] [N/A:N/A] Tendon: No Muscle: No Joint: No Bone: No Limited to Skin Breakdown Epithelialization: None N/A N/A Periwound Skin Texture: Edema: No N/A N/A Excoriation: No Induration: No Callus: No Crepitus: No Fluctuance: No Friable: No Rash: No Scarring: No Periwound Skin Maceration: No N/A N/A Moisture: Moist: No Dry/Scaly: No Periwound Skin Color: Atrophie Blanche: No N/A N/A Cyanosis: No Ecchymosis: No Erythema: No Hemosiderin Staining: No Mottled: No Pallor: No Rubor: No Tenderness on No N/A N/A Palpation: Wound Preparation: Ulcer  Cleansing: N/A N/A Rinsed/Irrigated with Saline Topical Anesthetic Applied: Other: lidocaine 4% Treatment Notes Electronic Signature(s) Signed: 03/31/2015 5:04:53 PM By: Montey Hora Entered By: Montey Hora on 03/31/2015 11:05:09 [name], [name] ([id number]) -------------------------------------------------------------------------------- Multi-Disciplinary Care Plan Details Patient Name: [name], Amy A. Date of Service: 03/31/2015 10:15 AM Medical Record Number: [id number] Patient Account Number: [id number] Date of Birth/Sex: [birth year] (59 y.o. Female) Treating RN: Montey Hora Primary Care Physician: Frazier Richards Other Clinician: Referring Physician: Forest Gleason Treating Physician/Extender: Frann Rider in Treatment: 0 Active Inactive Malignancy/Atypical Etiology Nursing Diagnoses: Knowledge deficit related to disease process and management of malignancy Goals: Patient/caregiver will verbalize understanding of disease process and disease management of  malignancy Date Initiated: 03/31/2015 Goal Status: Active Interventions: Assess patient and family medical history for signs and symptoms of malignancy/atypical etiology upon admission Provide education on malignant ulcerations Notes: Orientation to the Wound Care Program Nursing Diagnoses: Knowledge deficit related to the wound healing center program Goals: Patient/caregiver will verbalize understanding of the [place] Program Date Initiated: 03/31/2015 Goal Status: Active Interventions: Provide education on orientation to the wound center Notes: Wound/Skin Impairment Nursing Diagnoses: Impaired tissue integrity Goals: Martell, Iliani A. ([id number]) Ulcer/skin breakdown will have a volume reduction of 30% by week 4 Date Initiated: 03/31/2015 Goal Status: Active Ulcer/skin breakdown will have a volume reduction of 50% by week 8 Date Initiated: 03/31/2015 Goal Status: Active Ulcer/skin breakdown will have a volume reduction of 80% by week 12 Date Initiated: 03/31/2015 Goal Status: Active Ulcer/skin breakdown will heal within 14 weeks Date Initiated: 03/31/2015 Goal Status: Active Interventions: Assess ulceration(s) every visit Notes: Electronic Signature(s) Signed: 03/31/2015 5:04:53 PM By: Montey Hora Entered By: Montey Hora on 03/31/2015 10:57:26 [name], [name] ([id number]) -------------------------------------------------------------------------------- Patient/Caregiver Education Details Patient Name: Amy Felty A. Date of Service: 03/31/2015 10:15 AM Medical Record Number: [id number] Patient Account Number: [id number] Date of Birth/Gender: [birth year] (59 y.o. Female) Treating RN: Montey Hora Primary Care Physician: Frazier Richards Other Clinician: Referring Physician: Forest Gleason Treating Physician/Extender: Frann Rider in Treatment: 0 Education Assessment Education Provided To: Patient Education Topics Provided Wound/Skin  Impairment: Handouts: Other: wound care as ordered Methods: Demonstration, Explain/Verbal Responses: State content correctly Electronic Signature(s) Signed: 03/31/2015 5:04:53 PM By: Montey Hora Entered By: Montey Hora on 03/31/2015 11:08:12 [name], [name] Kitchen ([id number]) -------------------------------------------------------------------------------- Wound Assessment Details Patient Name: [name], Julliana A. Date of Service: 03/31/2015 10:15 AM Medical Record Number: [id number] Patient Account Number: [id number] Date of Birth/Sex: 1955-11-26 (59 y.o. Female) Treating RN: Montey Hora Primary Care Physician: Frazier Richards Other Clinician: Referring Physician: Forest Gleason Treating Physician/Extender: Frann Rider in Treatment: 0 Wound Status Wound Number: 1 Primary Malignant Wound Etiology: Wound Location: Right Breast Wound Open Wounding Event: Gradually Appeared Status: Date Acquired: 06/02/2014 Comorbid Hypertension, Myocardial Infarction, Weeks Of Treatment: 0 History: Received Chemotherapy, Received Clustered Wound: No Radiation Photos Photo Uploaded By: Montey Hora on 03/31/2015 12:29:49 Wound Measurements Length: (cm) 9 Width: (cm) 7.3 Depth: (cm) 0.2 Area: (cm) 51.601 Volume: (cm) 10.32 % Reduction in Area: 0% % Reduction in Volume: 0% Epithelialization: None Tunneling: No Undermining: No Wound Description Full Thickness Without Exposed Classification: Support Structures Wound Margin: Flat and Intact Exudate Medium Amount: Exudate Type: Serous Exudate Color: amber Foul Odor After Cleansing: No Wound Bed Granulation Amount: Large (67-100%) Exposed Structure Granulation Quality: Red Fascia Exposed: No Monaco, Neftali A. ([id number]) Necrotic Amount: Small (1-33%) Fat Layer Exposed: No Necrotic Quality: Eschar Tendon Exposed: No Muscle Exposed: No Joint Exposed: No Bone Exposed: No Limited to Skin Breakdown  Periwound  Skin Texture Texture Color No Abnormalities Noted: No No Abnormalities Noted: No Callus: No Atrophie Blanche: No Crepitus: No Cyanosis: No Excoriation: No Ecchymosis: No Fluctuance: No Erythema: No Friable: No Hemosiderin Staining: No Induration: No Mottled: No Localized Edema: No Pallor: No Rash: No Rubor: No Scarring: No Moisture No Abnormalities Noted: No Dry / Scaly: No Maceration: No Moist: No Wound Preparation Ulcer Cleansing: Rinsed/Irrigated with Saline Topical Anesthetic Applied: Other: lidocaine 4%, Treatment Notes Wound #1 (Right Breast) 1. Cleansed with: Clean wound with Normal Saline 2. Anesthetic Topical Lidocaine 4% cream to wound bed prior to debridement 4. Dressing Applied: Aquacel Ag 5. Secondary Dressing Applied Bordered Foam Dressing Electronic Signature(s) Signed: 03/31/2015 5:04:53 PM By: Montey Hora Entered By: Montey Hora on 03/31/2015 10:55:52 Wotton, [name] ([id number]) -------------------------------------------------------------------------------- Vitals Details Patient Name: Mcloud, Martin A. Date of Service: 03/31/2015 10:15 AM Medical Record Number: [id number] Patient Account Number: [id number] Date of Birth/Sex: 1955-10-14 (59 y.o. Female) Treating RN: Montey Hora Primary Care Physician: Frazier Richards Other Clinician: Referring Physician: Forest Gleason Treating Physician/Extender: Frann Rider in Treatment: 0 Vital Signs Time Taken: 10:34 Temperature (F): 98.2 Height (in): 65 Pulse (bpm): 102 Source: Stated Respiratory Rate (breaths/min): 20 Weight (lbs): 340 Blood Pressure (mmHg): 135/69 Source: Stated Reference Range: 80 - 120 mg / dl Body Mass Index (BMI): 56.6 Electronic Signature(s) Signed: 03/31/2015 5:04:53 PM By: Montey Hora Entered By: Montey Hora on 03/31/2015 10:37:20

## 2015-04-01 NOTE — Progress Notes (Signed)
AMILY, DEPP ([id number]) Visit Report for 03/31/2015 Abuse/Suicide Risk Screen Details Patient Name: [name], [name] 03/31/2015 10:15 Date of Service: AM Medical Record [id number] Number: Patient Account Number: [id number] Oct 31, 1955 (59 y.o. Treating RN: Montey Hora Date of Birth/Sex: Female) Other Clinician: Primary Care Physician: Frazier Richards Treating Britto, Errol Referring Physician: Forest Gleason Physician/Extender: Suella Grove in Treatment: 0 Abuse/Suicide Risk Screen Items Answer ABUSE/SUICIDE RISK SCREEN: Has anyone close to you tried to hurt or harm you recentlyo No Do you feel uncomfortable with anyone in your familyo No Has anyone forced you do things that you didnot want to doo No Do you have any thoughts of harming yourselfo No Patient displays signs or symptoms of abuse and/or neglect. No Electronic Signature(s) Signed: 03/31/2015 5:04:53 PM By: Montey Hora Entered By: Montey Hora on 03/31/2015 10:44:13 Knotek, Real Cons ([id number]) -------------------------------------------------------------------------------- Activities of Daily Living Details Patient Name: Amy, Pearson 03/31/2015 10:15 Date of Service: AM Medical Record [id number] Number: Patient Account Number: [id number] February 11, 1956 (59 y.o. Treating RN: Montey Hora Date of Birth/Sex: Female) Other Clinician: Primary Care Physician: Frazier Richards Treating Christin Fudge Referring Physician: Forest Gleason Physician/Extender: Suella Grove in Treatment: 0 Activities of Daily Living Items Answer Activities of Daily Living (Please select one for each item) Drive Automobile Completely Able Take Medications Completely Able Use Telephone Completely Able Care for Appearance Completely Able Use Toilet Completely Able Bath / Shower Completely Able Dress Self Completely Able Feed Self Completely Able Walk Completely Able Get In / Out Bed Completely Able Housework Completely  Able Prepare Meals Completely [place] for Self Completely Able Electronic Signature(s) Signed: 03/31/2015 5:04:53 PM By: Montey Hora Entered By: Montey Hora on 03/31/2015 10:45:05 Litzinger, Real Cons ([id number]) -------------------------------------------------------------------------------- Education Assessment Details Patient Name: Amy Felty A. 03/31/2015 10:15 Date of Service: AM Medical Record [id number] Number: Patient Account Number: [id number] [birth year] (59 y.o. Treating RN: Montey Hora Date of Birth/Sex: Female) Other Clinician: Primary Care Physician: Frazier Richards Treating Christin Fudge Referring Physician: Forest Gleason Physician/Extender: Suella Grove in Treatment: 0 Primary Learner Assessed: Patient Learning Preferences/Education Level/Primary Language Learning Preference: Explanation, Demonstration Highest Education Level: College or Above Preferred Language: English Cognitive Barrier Assessment/Beliefs Language Barrier: No Translator Needed: No Memory Deficit: No Emotional Barrier: No Cultural/Religious Beliefs Affecting Medical No Care: Physical Barrier Assessment Impaired Vision: No Impaired Hearing: No Decreased Hand dexterity: No Knowledge/Comprehension Assessment Knowledge Level: Medium Comprehension Level: Medium Ability to understand written Medium instructions: Ability to understand verbal Medium instructions: Motivation Assessment Anxiety Level: Calm Cooperation: Cooperative Education Importance: Acknowledges Need Interest in Health Problems: Asks Questions Perception: Coherent Willingness to Engage in Self- Medium Management Activities: Readiness to Engage in Self- Medium Management Activities: TYLER, ROBIDOUX ([id number]) Electronic Signature(s) Signed: 03/31/2015 5:04:53 PM By: Montey Hora Entered By: Montey Hora on 03/31/2015 10:46:02 Loux, Real Cons  ([id number]) -------------------------------------------------------------------------------- Fall Risk Assessment Details Patient Name: Amy Felty A. 03/31/2015 10:15 Date of Service: AM Medical Record [id number] Number: Patient Account Number: [id number] June 04, 1956 (59 y.o. Treating RN: Montey Hora Date of Birth/Sex: Female) Other Clinician: Primary Care Physician: Frazier Richards Treating Christin Fudge Referring Physician: Forest Gleason Physician/Extender: Suella Grove in Treatment: 0 Fall Risk Assessment Items FALL RISK ASSESSMENT: History of falling - immediate or within 3 months 0 No Secondary diagnosis 0 No Ambulatory aid None/bed rest/wheelchair/nurse 0 Yes Crutches/cane/walker 0 No Furniture 0 No IV Access/Saline Lock 0 No Gait/Training Normal/bed rest/immobile 0 Yes Weak 0 No Impaired 0 No Mental Status Oriented to own ability 0 Yes Electronic Signature(s) Signed: 03/31/2015  5:04:53 PM By: Montey Hora Entered By: Montey Hora on 03/31/2015 10:46:46 Dain, Real Cons ([id number]) -------------------------------------------------------------------------------- Nutrition Risk Assessment Details Patient Name: Amy, [name] 03/31/2015 10:15 Date of Service: AM Medical Record [id number] Number: Patient Account Number: [id number] 1956-04-22 (59 y.o. Treating RN: Montey Hora Date of Birth/Sex: Female) Other Clinician: Primary Care Physician: Frazier Richards Treating Christin Fudge Referring Physician: Forest Gleason Physician/Extender: Suella Grove in Treatment: 0 Height (in): 65 Weight (lbs): 340 Body Mass Index (BMI): 56.6 Nutrition Risk Assessment Items NUTRITION RISK SCREEN: I have an illness or condition that made me change the kind and/or 0 No amount of food I eat I eat fewer than two meals per day 0 No I eat few fruits and vegetables, or milk products 0 No I have three or more drinks of beer, liquor or wine almost every day 0 No I have tooth  or mouth problems that make it hard for me to eat 0 No I don't always have enough money to buy the food I need 0 No I eat alone most of the time 0 No I take three or more different prescribed or over-the-counter drugs a 1 Yes day Without wanting to, I have lost or gained 10 pounds in the last six 0 No months I am not always physically able to shop, cook and/or feed myself 0 No Nutrition Protocols Good Risk Protocol 0 No interventions needed Moderate Risk Protocol Electronic Signature(s) Signed: 03/31/2015 5:04:53 PM By: Montey Hora Entered By: Montey Hora on 03/31/2015 10:47:48

## 2015-04-06 ENCOUNTER — Inpatient Hospital Stay: Payer: BC Managed Care – PPO | Attending: Family Medicine

## 2015-04-06 ENCOUNTER — Encounter: Payer: Self-pay | Admitting: Oncology

## 2015-04-06 ENCOUNTER — Encounter: Payer: Self-pay | Admitting: *Deleted

## 2015-04-06 ENCOUNTER — Ambulatory Visit: Payer: BC Managed Care – PPO

## 2015-04-06 ENCOUNTER — Inpatient Hospital Stay (HOSPITAL_BASED_OUTPATIENT_CLINIC_OR_DEPARTMENT_OTHER): Payer: BC Managed Care – PPO | Admitting: Family Medicine

## 2015-04-06 ENCOUNTER — Inpatient Hospital Stay: Payer: BC Managed Care – PPO

## 2015-04-06 ENCOUNTER — Other Ambulatory Visit: Payer: BC Managed Care – PPO

## 2015-04-06 VITALS — BP 157/94 | HR 103 | Temp 98.6°F | Resp 18 | Wt 341.5 lb

## 2015-04-06 DIAGNOSIS — C7989 Secondary malignant neoplasm of other specified sites: Secondary | ICD-10-CM

## 2015-04-06 DIAGNOSIS — R5381 Other malaise: Secondary | ICD-10-CM | POA: Diagnosis not present

## 2015-04-06 DIAGNOSIS — I10 Essential (primary) hypertension: Secondary | ICD-10-CM | POA: Diagnosis not present

## 2015-04-06 DIAGNOSIS — C50911 Malignant neoplasm of unspecified site of right female breast: Secondary | ICD-10-CM | POA: Diagnosis not present

## 2015-04-06 DIAGNOSIS — Z5111 Encounter for antineoplastic chemotherapy: Secondary | ICD-10-CM | POA: Diagnosis not present

## 2015-04-06 DIAGNOSIS — C7970 Secondary malignant neoplasm of unspecified adrenal gland: Secondary | ICD-10-CM | POA: Diagnosis not present

## 2015-04-06 DIAGNOSIS — C7951 Secondary malignant neoplasm of bone: Secondary | ICD-10-CM

## 2015-04-06 DIAGNOSIS — Z955 Presence of coronary angioplasty implant and graft: Secondary | ICD-10-CM | POA: Diagnosis not present

## 2015-04-06 DIAGNOSIS — I252 Old myocardial infarction: Secondary | ICD-10-CM | POA: Insufficient documentation

## 2015-04-06 DIAGNOSIS — Z7982 Long term (current) use of aspirin: Secondary | ICD-10-CM | POA: Diagnosis not present

## 2015-04-06 DIAGNOSIS — Z006 Encounter for examination for normal comparison and control in clinical research program: Secondary | ICD-10-CM | POA: Diagnosis not present

## 2015-04-06 DIAGNOSIS — Z170 Estrogen receptor positive status [ER+]: Secondary | ICD-10-CM | POA: Diagnosis not present

## 2015-04-06 DIAGNOSIS — I890 Lymphedema, not elsewhere classified: Secondary | ICD-10-CM | POA: Diagnosis not present

## 2015-04-06 LAB — COMPREHENSIVE METABOLIC PANEL
ALT: 15 U/L (ref 14–54)
ANION GAP: 7 (ref 5–15)
AST: 24 U/L (ref 15–41)
Albumin: 3.2 g/dL — ABNORMAL LOW (ref 3.5–5.0)
Alkaline Phosphatase: 61 U/L (ref 38–126)
BILIRUBIN TOTAL: 0.6 mg/dL (ref 0.3–1.2)
BUN: 16 mg/dL (ref 6–20)
CO2: 23 mmol/L (ref 22–32)
Calcium: 8.5 mg/dL — ABNORMAL LOW (ref 8.9–10.3)
Chloride: 107 mmol/L (ref 101–111)
Creatinine, Ser: 0.69 mg/dL (ref 0.44–1.00)
GFR calc Af Amer: >60 mL/min (ref >60)
Glucose, Bld: 173 mg/dL — ABNORMAL HIGH (ref 65–99)
POTASSIUM: 3.9 mmol/L (ref 3.5–5.1)
Sodium: 137 mmol/L (ref 135–145)
TOTAL PROTEIN: 7.2 g/dL (ref 6.5–8.1)

## 2015-04-06 LAB — CBC WITH DIFFERENTIAL/PLATELET
BASOS ABS: 0.1 10*3/uL (ref 0–0.1)
BASOS PCT: 1 %
EOS PCT: 2 %
Eosinophils Absolute: 0.2 10*3/uL (ref 0–0.7)
HCT: 34.7 % — ABNORMAL LOW (ref 35.0–47.0)
Hemoglobin: 11.4 g/dL — ABNORMAL LOW (ref 12.0–16.0)
LYMPHS PCT: 21 %
Lymphs Abs: 1.8 10*3/uL (ref 1.0–3.6)
MCH: 27.5 pg (ref 26.0–34.0)
MCHC: 32.8 g/dL (ref 32.0–36.0)
MCV: 83.9 fL (ref 80.0–100.0)
Monocytes Absolute: 0.8 10*3/uL (ref 0.2–0.9)
Monocytes Relative: 9 %
NEUTROS ABS: 5.8 10*3/uL (ref 1.4–6.5)
Neutrophils Relative %: 67 %
PLATELETS: 289 10*3/uL (ref 150–440)
RBC: 4.14 MIL/uL (ref 3.80–5.20)
RDW: 23.6 % — ABNORMAL HIGH (ref 11.5–14.5)
WBC: 8.7 10*3/uL (ref 3.6–11.0)

## 2015-04-06 MED ORDER — SODIUM CHLORIDE 0.9 % IJ SOLN
10.0000 mL | INTRAMUSCULAR | Status: DC | PRN
  Administered 2015-04-06: 10 mL
  Filled 2015-04-06: qty 10

## 2015-04-06 MED ORDER — HEPARIN SOD (PORK) LOCK FLUSH 100 UNIT/ML IV SOLN
500.0000 [IU] | Freq: Once | INTRAVENOUS | Status: AC | PRN
  Administered 2015-04-06: 500 [IU]

## 2015-04-06 MED ORDER — SODIUM CHLORIDE 0.9 % IV SOLN
Freq: Once | INTRAVENOUS | Status: AC
  Administered 2015-04-06: 12:00:00 via INTRAVENOUS
  Filled 2015-04-06: qty 4

## 2015-04-06 MED ORDER — DIPHENHYDRAMINE HCL 50 MG/ML IJ SOLN
50.0000 mg | Freq: Once | INTRAMUSCULAR | Status: AC
  Administered 2015-04-06: 50 mg via INTRAVENOUS
  Filled 2015-04-06: qty 1

## 2015-04-06 MED ORDER — FAMOTIDINE IN NACL 20-0.9 MG/50ML-% IV SOLN
20.0000 mg | Freq: Once | INTRAVENOUS | Status: AC
  Administered 2015-04-06: 20 mg via INTRAVENOUS
  Filled 2015-04-06: qty 50

## 2015-04-06 MED ORDER — PACLITAXEL CHEMO INJECTION 300 MG/50ML
90.0000 mg/m2 | Freq: Once | INTRAVENOUS | Status: AC
  Administered 2015-04-06: 240 mg via INTRAVENOUS
  Filled 2015-04-06: qty 40

## 2015-04-06 MED ORDER — HEPARIN SOD (PORK) LOCK FLUSH 100 UNIT/ML IV SOLN
INTRAVENOUS | Status: AC
  Filled 2015-04-06: qty 5

## 2015-04-06 MED ORDER — SODIUM CHLORIDE 0.9 % IV SOLN
Freq: Once | INTRAVENOUS | Status: AC
  Administered 2015-04-06: 11:00:00 via INTRAVENOUS
  Filled 2015-04-06: qty 1000

## 2015-04-06 NOTE — Progress Notes (Signed)
04/06/15 @10 :15am [name] returns to clinic this morning for consideration of Cycle 3 Day 1 Taxol infusion per [facility] [id number] I research study. Patient reports she has been doing very well over the past week and denies experiencing any side effects related to Taxol. Denies any new neuropathy. States the numbness she originally experienced in her right hand and lower arm has significantly improved and her lymphedema has decreased. She has improved use of the arm and better ROM as well. Lymphedema is grade 2 and unrelated to the Taxol. Reports some occasional fatigue, but also states this has improved since she began receiving chemotherapy. Study questionnaire booklet completed by patient while in MD office. Georgeanne Nim, NP in to examine patient. 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 Their goal at this time is to help her keep it clean and free of infection. VS are stable for this patient.CBC and chemistries are within acceptable limits for patient to receive her treatment today. Will proceed with C3D1 Taxol as scheduled. 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

## 2015-04-06 NOTE — Progress Notes (Signed)
[place]  [phone]  [phone]) [phone]     [name] DOB: 14-Oct-1955  MR#: [id number]  TDS#:[id number]  Patient Care Team: Kirk Ruths, MD as PCP - General (Internal Medicine)  CHIEF COMPLAINT:  Chief Complaint  Patient presents with  . OTHER   1. Breast cancer see also 07/08/13 note, initially a T4 N2 M1 ER positive HER-2 negative stage IV disease 2. Anemia iron deficiency attributed to prior bleeding from wound C earlier documents 3. Right arm lymphedema prior cellulitis 4. Right breast nonhealing but stable wound 5. Past history of hypertension hyperlipidemia coronary disease stenting           INTERVAL HISTORY:  Patient is here for further follow-up and treatment consideration regarding stage IV metastatic breast cancer. Patient is currently under treatment with protocol. She was randomized to receive Taxol chemotherapy and today's beginning cycle 3 day 1. Patient reports that she saw the wound care physician for her right chest wall ulceration on last Tuesday. Physician with wound care started her on silver infuse dressing is every other day. She reports wound has improved in size but continues to drain. She denies any numbness or tingling. Has reported some mild fatigue, but overall feels very well.  REVIEW OF SYSTEMS:   Review of Systems  Constitutional: Positive for malaise/fatigue. Negative for fever, chills, weight loss and diaphoresis.  HENT: Negative for congestion, ear discharge, ear pain, hearing loss, nosebleeds, sore throat and tinnitus.   Eyes: Negative for blurred vision, double vision, photophobia, pain, discharge and redness.  Respiratory: Negative for cough, hemoptysis, sputum production, shortness of breath, wheezing and stridor.   Cardiovascular: Negative for chest pain, palpitations, orthopnea, claudication, leg swelling and PND.  Gastrointestinal: Negative for heartburn, nausea, vomiting, abdominal pain, diarrhea,  constipation, blood in stool and melena.  Genitourinary: Negative.   Musculoskeletal: Negative.   Skin: Negative.        Right chest wall skin ulceration healing per patient, covered currently with dressing  Neurological: Negative for dizziness, tingling, focal weakness, seizures, weakness and headaches.  Endo/Heme/Allergies: Does not bruise/bleed easily.  Psychiatric/Behavioral: Negative for depression. The patient is not nervous/anxious and does not have insomnia.     As per HPI. Otherwise, a complete review of systems is negatve.  ONCOLOGY HISTORY: Oncology History   1. 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 Previously noted long-term bleeding from the wound of right breast associated with cancer. 2. Patient has extensive right arm lymphedema. Patient is limited in range of motion due to lymphedema and pain in right arm. 3. Patient was started on Xgeva and letrozole in January 2015, [name] was started in April 2015. 4. CEA at lowest 7.4, failure of Ibrance and letrozole with steady rise in CEA. Most recent CEA in May 2016 reported as 223.    6.[place] XGEVA FIRST [place] 06/2013 LETRAZOLE 07/12/13  IBRANCE 10/10/13. ibrance 12/27/13..cea lowest at 7.4, but failure of ibrance and letrozole as steady rise since     Breast cancer metastasized to bone ([place])   06/27/2013 Initial Diagnosis Breast cancer metastasized to bone    Breast CA ([place])   01/28/2015 Initial Diagnosis Breast CA    PAST MEDICAL HISTORY: Past Medical History  Diagnosis Date  . Hypertension   . Myocardial infarction   . Breast cancer   . Breast cancer metastasized to bone 11/21/2014  . Anginal pain   .  Sleep apnea   . Anemia     PAST SURGICAL HISTORY: Past Surgical History  Procedure Laterality Date  . Coronary angioplasty with stent placement N/A   . Hernia repair      umbilical hernia repair  . Portacath  placement Right 02/03/2015    Procedure: INSERTION PORT-A-CATH;  Surgeon: [name], MD;  Location: [facility] ORS;  Service: General;  Laterality: Right;    FAMILY HISTORY Family History  Problem Relation Age of Onset  . Atrial fibrillation Sister   . Hypertension Sister     GYNECOLOGIC HISTORY:  No LMP recorded. Patient is postmenopausal.     ADVANCED DIRECTIVES:    HEALTH MAINTENANCE: Social History  Substance Use Topics  . Smoking status: Former Smoker -- 1.00 packs/day    Types: Cigarettes    Quit date: 10/25/2004  . Smokeless tobacco: Never Used     Comment: stopped smoking greater than 7 years ago  . Alcohol Use: No     Colonoscopy:  PAP:  Bone density:  Lipid panel:  Allergies  Allergen Reactions  . No Known Allergies     Current Outpatient Prescriptions  Medication Sig Dispense Refill  . acetaminophen (TYLENOL) 500 MG tablet Take 1,000 mg by mouth every 6 (six) hours as needed.    Marland Kitchen aspirin EC 81 MG tablet Take 81 mg by mouth daily.    . clopidogrel (PLAVIX) 75 MG tablet Take 75 mg by mouth daily.    . [employer] CALCIUM 600+D 600-800 MG-UNIT TABS Take 1 tablet by mouth daily. At noon  3  . enalapril (VASOTEC) 5 MG tablet   2  . fexofenadine (ALLEGRA) 180 MG tablet Take 180 mg by mouth at bedtime.    . furosemide (LASIX) 20 MG tablet Take 20 mg by mouth daily.  1  . lidocaine-prilocaine (EMLA) cream Apply 1 application topically as needed. 30 g 3  . lisinopril (PRINIVIL,ZESTRIL) 20 MG tablet Take 20 mg by mouth daily.    . metoprolol succinate (TOPROL-XL) 50 MG 24 hr tablet Take 50 mg by mouth 2 (two) times daily. Take with or immediately following a meal.    . omega-3 acid ethyl esters (LOVAZA) 1 G capsule Take 1 g by mouth at bedtime.    . ondansetron (ZOFRAN) 8 MG tablet Take 1 tablet (8 mg total) by mouth 2 (two) times daily. Start the day after chemo for 2 days. Then take as needed for nausea or vomiting. 30 tablet 1  . phosphorus (PHOSPHA 250 NEUTRAL)  155-852-130 MG tablet Take 1 tablet (250 mg total) by mouth 2 (two) times daily. 60 tablet 6  . potassium chloride (K-DUR) 10 MEQ tablet Take 10 mEq by mouth daily.   1  . simvastatin (ZOCOR) 80 MG tablet Take 80 mg by mouth at bedtime.     No current facility-administered medications for this visit.   Facility-Administered Medications Ordered in Other Visits  Medication Dose Route Frequency Provider Last Rate Last Dose  . sodium chloride 0.9 % injection 10 mL  10 mL Intracatheter PRN Forest Gleason, MD   10 mL at 04/06/15 0951    OBJECTIVE: BP 157/94 mmHg  Pulse 103  Wt 341 lb 8 oz (154.903 kg)   Body mass index is 58.59 kg/(m^2).    ECOG FS:1 - Symptomatic but completely ambulatory  General: Well-developed, well-nourished, no acute distress, obese Eyes: Pink conjunctiva, anicteric sclera. HEENT: Normocephalic, moist mucous membranes, clear oropharnyx. Lungs: Clear to auscultation bilaterally. Heart: Regular rate and rhythm. No  rubs, murmurs, or gallops. Abdomen: Soft, nontender, nondistended. No organomegaly noted, normoactive bowel sounds. Musculoskeletal: No edema, cyanosis, or clubbing. Neuro: Alert, answering all questions appropriately. Cranial nerves grossly intact. Skin: Right chest wall ulceration from tumor, currently dressed. Psych: Normal affect.    LAB RESULTS:  Infusion on 04/06/2015  Component Date Value Ref Range Status  . Sodium 04/06/2015 137  135 - 145 mmol/L Final  . Potassium 04/06/2015 3.9  3.5 - 5.1 mmol/L Final  . Chloride 04/06/2015 107  101 - 111 mmol/L Final  . CO2 04/06/2015 23  22 - 32 mmol/L Final  . Glucose, Bld 04/06/2015 173* 65 - 99 mg/dL Final  . BUN 04/06/2015 16  6 - 20 mg/dL Final  . Creatinine, Ser 04/06/2015 0.69  0.44 - 1.00 mg/dL Final  . Calcium 04/06/2015 8.5* 8.9 - 10.3 mg/dL Final  . Total Protein 04/06/2015 7.2  6.5 - 8.1 g/dL Final  . Albumin 04/06/2015 3.2* 3.5 - 5.0 g/dL Final  . AST 04/06/2015 24  15 - 41 U/L Final  . ALT  04/06/2015 15  14 - 54 U/L Final  . Alkaline Phosphatase 04/06/2015 61  38 - 126 U/L Final  . Total Bilirubin 04/06/2015 0.6  0.3 - 1.2 mg/dL Final  . GFR calc non Af Amer 04/06/2015 >60  >60 mL/min Final  . GFR calc Af Amer 04/06/2015 >60  >60 mL/min Final   Comment: (NOTE) The eGFR has been calculated using the CKD EPI equation. This calculation has not been validated in all clinical situations. eGFR's persistently <60 mL/min signify possible Chronic Kidney Disease.   . Anion gap 04/06/2015 7  5 - 15 Final  . WBC 04/06/2015 8.7  3.6 - 11.0 K/uL Final  . RBC 04/06/2015 4.14  3.80 - 5.20 MIL/uL Final  . Hemoglobin 04/06/2015 11.4* 12.0 - 16.0 g/dL Final  . HCT 04/06/2015 34.7* 35.0 - 47.0 % Final  . MCV 04/06/2015 83.9  80.0 - 100.0 fL Final  . MCH 04/06/2015 27.5  26.0 - 34.0 pg Final  . MCHC 04/06/2015 32.8  32.0 - 36.0 g/dL Final  . RDW 04/06/2015 23.6* 11.5 - 14.5 % Final  . Platelets 04/06/2015 289  150 - 440 K/uL Final  . Neutrophils Relative % 04/06/2015 67   Final  . Neutro Abs 04/06/2015 5.8  1.4 - 6.5 K/uL Final  . Lymphocytes Relative 04/06/2015 21   Final  . Lymphs Abs 04/06/2015 1.8  1.0 - 3.6 K/uL Final  . Monocytes Relative 04/06/2015 9   Final  . Monocytes Absolute 04/06/2015 0.8  0.2 - 0.9 K/uL Final  . Eosinophils Relative 04/06/2015 2   Final  . Eosinophils Absolute 04/06/2015 0.2  0 - 0.7 K/uL Final  . Basophils Relative 04/06/2015 1   Final  . Basophils Absolute 04/06/2015 0.1  0 - 0.1 K/uL Final    STUDIES: No results found.  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  PLAN:   1. Breast cancer. Stage IV carcinoma of breast with bone and adrenal mets. She is tolerating chemotherapy under protocol very well. Will proceed with cycle 3 day 1 Taxol today. She will return in 1 week for day 8 Taxol under protocol.  2. Right chest wall ulceration. Patient reports having seen the wound care clinic this past last week. MD started  dressing changes every other day with a silver infused dressing. Patient states that wound is healing but continues to drain. Patient also reports continuing with lymphedema  therapy including compression garment.   Patient expressed understanding and was in agreement with this plan. She also understands that She can call clinic at any time with any questions, concerns, or complaints.   Dr. Oliva Bustard was available for consultation and review of plan of care for this patient.  Breast cancer metastasized to bone [facility])   Staging form: Breast, AJCC 7th Edition     Clinical stage from 06/27/2013: Stage IV (T4, N2, M1) - Signed by [name], NP on 12/25/2014   [name], NP   04/06/2015 10:38 AM

## 2015-04-06 NOTE — Progress Notes (Signed)
Patient former smoker.  Patient does not have living will. 

## 2015-04-07 ENCOUNTER — Encounter: Payer: BC Managed Care – PPO | Admitting: Surgery

## 2015-04-07 DIAGNOSIS — S21001A Unspecified open wound of right breast, initial encounter: Secondary | ICD-10-CM | POA: Diagnosis not present

## 2015-04-07 NOTE — Progress Notes (Signed)
[name], [name] ([id number]) Visit Report for 04/07/2015 Arrival Information Details Patient Name: [name], Amy A. Date of Service: 04/07/2015 3:00 PM Medical Record Number: [id number] Patient Account Number: [id number] Date of Birth/Sex: August 14, 1955 (59 yPearsono. Female) Treating RN: Afful, RN, BSN, Velva Harman Primary Care Physician: Frazier Richards Other Clinician: Referring Physician: Frazier Richards Treating Physician/Extender: Frann Rider in Treatment: 1 Visit Information History Since Last Visit Any new allergies or adverse reactions: No Patient Arrived: Ambulatory Had a fall or experienced change in No Arrival Time: 14:58 activities of daily living that may affect Accompanied By: SELF risk of falls: Transfer Assistance: None Signs or symptoms of abuse/neglect since last No Patient Identification Verified: Yes visito Secondary Verification Process Yes Hospitalized since last visit: No Completed: Has Dressing in Place as Prescribed: Yes Patient Has Alerts: Yes Pain Present Now: No Patient Alerts: aspirin 81, plavix Electronic Signature(s) Signed: 04/07/2015 2:59:02 PM By: Regan Lemming BSN, RN Entered By: Regan Lemming on 04/07/2015 14:59:02 [name], [name] ([id number]) -------------------------------------------------------------------------------- Clinic Level of Care Assessment Details Patient Name: [name], [name] Date of Service: 04/07/2015 3:00 PM Medical Record Number: [id number] Patient Account Number: [id number] Date of Birth/Sex: May 29, 1956 (59 yPearsono. Female) Treating RN: Afful, RN, BSN, [place] Primary Care Physician: Frazier Richards Other Clinician: Referring Physician: Frazier Richards Treating Physician/Extender: Frann Rider in Treatment: 1 Clinic Level of Care Assessment Items TOOL 4 Quantity Score []  - Use when only an EandM is performed on FOLLOW-UP visit 0 ASSESSMENTS - Nursing Assessment / Reassessment X - Reassessment of  Co-morbidities (includes updates in patient status) 1 10 X - Reassessment of Adherence to Treatment Plan 1 5 ASSESSMENTS - Wound and Skin Assessment / Reassessment X - Simple Wound Assessment / Reassessment - one wound 1 5 []  - Complex Wound Assessment / Reassessment - multiple wounds 0 []  - Dermatologic / Skin Assessment (not related to wound area) 0 ASSESSMENTS - Focused Assessment []  - Circumferential Edema Measurements - multi extremities 0 []  - Nutritional Assessment / Counseling / Intervention 0 []  - Lower Extremity Assessment (monofilament, tuning fork, pulses) 0 []  - Peripheral Arterial Disease Assessment (using hand held doppler) 0 ASSESSMENTS - Ostomy and/or Continence Assessment and Care []  - Incontinence Assessment and Management 0 []  - Ostomy Care Assessment and Management (repouching, etc.) 0 PROCESS - Coordination of Care X - Simple Patient / Family Education for ongoing care 1 15 []  - Complex (extensive) Patient / Family Education for ongoing care 0 []  - Staff obtains Programmer, systems, Records, Test Results / Process Orders 0 []  - Staff telephones HHA, Nursing Homes / Clarify orders / etc 0 []  - Routine Transfer to another Facility (non-emergent condition) 0 [name], Amy A. ([id number]) []  - Routine Hospital Admission (non-emergent condition) 0 []  - New Admissions / Biomedical engineer / Ordering NPWT, Apligraf, etc. 0 []  - Emergency Hospital Admission (emergent condition) 0 []  - Simple Discharge Coordination 0 []  - Complex (extensive) Discharge Coordination 0 PROCESS - Special Needs []  - Pediatric / Minor Patient Management 0 []  - Isolation Patient Management 0 []  - Hearing / Language / Visual special needs 0 []  - Assessment of Community assistance (transportation, D/C planning, etc.) 0 []  - Additional assistance / Altered mentation 0 []  - Support Surface(s) Assessment (bed, cushion, seat, etc.) 0 INTERVENTIONS - Wound Cleansing / Measurement X - Simple Wound  Cleansing - one wound 1 5 []  - Complex Wound Cleansing - multiple wounds 0 X - Wound Imaging (photographs - any number of wounds) 1 5 []  - Wound Tracing (  instead of photographs) 0 []  - Simple Wound Measurement - one wound 0 []  - Complex Wound Measurement - multiple wounds 0 INTERVENTIONS - Wound Dressings X - Small Wound Dressing one or multiple wounds 1 10 []  - Medium Wound Dressing one or multiple wounds 0 []  - Large Wound Dressing one or multiple wounds 0 []  - Application of Medications - topical 0 []  - Application of Medications - injection 0 INTERVENTIONS - Miscellaneous []  - External ear exam 0 [name], Amy A. ([id number]) []  - Specimen Collection (cultures, biopsies, blood, body fluids, etc.) 0 []  - Specimen(s) / Culture(s) sent or taken to Lab for analysis 0 []  - Patient Transfer (multiple staff / Harrel Lemon Lift / Similar devices) 0 []  - Simple Staple / Suture removal (25 or less) 0 []  - Complex Staple / Suture removal (26 or more) 0 []  - Hypo / Hyperglycemic Management (close monitor of Blood Glucose) 0 []  - Ankle / Brachial Index (ABI) - do not check if billed separately 0 X - Vital Signs 1 5 Has the patient been seen at the hospital within the last three years: Yes Total Score: 60 Level Of Care: New/Established - Level 2 Electronic Signature(s) Signed: 04/07/2015 3:12:43 PM By: Regan Lemming BSN, RN Entered By: Regan Lemming on 04/07/2015 15:12:42 [name] -------------------------------------------------------------------------------- Encounter Discharge Information Details Patient Name: [name], [name] Date of Service: 04/07/2015 3:00 PM Medical Record Number: [id number] Patient Account Number: [id number] Date of Birth/Sex: 05/16/1956 (59 yPearsono. Female) Treating RN: Baruch Gouty, RN, BSN, Velva Harman Primary Care Physician: Frazier Richards Other Clinician: Referring Physician: Frazier Richards Treating Physician/Extender: Frann Rider in Treatment:  1 Encounter Discharge Information Items Discharge Pain Level: 0 Discharge Condition: Stable Ambulatory Status: Ambulatory Discharge Destination: Home Transportation: Private Auto Accompanied By: self Schedule Follow-up Appointment: No Medication Reconciliation completed and provided to Patient/Care No Killian Schwer: Provided on Clinical Summary of Care: 04/07/2015 Form Type Recipient Paper Patient ES Electronic Signature(s) Signed: 04/07/2015 3:14:26 PM By: Ruthine Dose Previous Signature: 04/07/2015 3:13:47 PM Version By: Regan Lemming BSN, RN Entered By: Ruthine Dose on 04/07/2015 15:14:26 [name], [name] ([id number]) -------------------------------------------------------------------------------- Lower Extremity Assessment Details Patient Name: Lietzke, Xylah A. Date of Service: 04/07/2015 3:00 PM Medical Record Number: [id number] Patient Account Number: [id number] Date of Birth/Sex: Jul 27, 1955 (59 yPearsono. Female) Treating RN: Afful, RN, BSN, Velva Harman Primary Care Physician: Frazier Richards Other Clinician: Referring Physician: Frazier Richards Treating Physician/Extender: Frann Rider in Treatment: 1 Electronic Signature(s) Signed: 04/07/2015 2:59:33 PM By: Regan Lemming BSN, RN Entered By: Regan Lemming on 04/07/2015 14:59:33 [name], Amy AMarland Kitchen ([id number]) -------------------------------------------------------------------------------- Multi Wound Chart Details Patient Name: Cleere, Hebah A. Date of Service: 04/07/2015 3:00 PM Medical Record Number: [id number] Patient Account Number: [id number] Date of Birth/Sex: [birth year] (59 yPearsono. Female) Treating RN: Baruch Gouty, RN, BSN, Velva Harman Primary Care Physician: Frazier Richards Other Clinician: Referring Physician: Frazier Richards Treating Physician/Extender: Frann Rider in Treatment: 1 Vital Signs Height(in): 65 Pulse(bpm): 84 Weight(lbs): 340 Blood Pressure 145/74 (mmHg): Body Mass Index(BMI):  57 Temperature(F): 98 Respiratory Rate 18 (breaths/min): Photos: [1:No Photos] [N/A:N/A] Wound Location: [1:Right Breast] [N/A:N/A] Wounding Event: [1:Gradually Appeared] [N/A:N/A] Primary Etiology: [1:Malignant Wound] [N/A:N/A] Comorbid History: [1:Hypertension, Myocardial Infarction, Received Chemotherapy, Received Radiation] [N/A:N/A] Date Acquired: [1:06/02/2014] [N/A:N/A] Weeks of Treatment: [1:1] [N/A:N/A] Wound Status: [1:Open] [N/A:N/A] Measurements L x W x D 9Pearson5x6Pearson3x0Pearson2 [N/A:N/A] (cm) Area (cm) : [1:47Pearson006] [N/A:N/A] Volume (cm) : [1:9Pearson401] [N/A:N/A] % Reduction in Area: [1:8Pearson90%] [N/A:N/A] % Reduction in Volume: 8Pearson90% [N/A:N/A] Classification: [1:Full Thickness Without Exposed Support Structures] [N/A:N/A] Exudate  Amount: [1:Medium] [N/A:N/A] Exudate Type: [1:Serous] [N/A:N/A] Exudate Color: [1:amber] [N/A:N/A] Wound Margin: [1:Flat and Intact] [N/A:N/A] Granulation Amount: [1:Large (67-100%)] [N/A:N/A] Granulation Quality: [1:Red] [N/A:N/A] Necrotic Amount: [1:Small (1-33%)] [N/A:N/A] Exposed Structures: [1:Fascia: No Fat: No Tendon: No] [N/A:N/A] Muscle: No Joint: No Bone: No Limited to Skin Breakdown Epithelialization: None N/A N/A Periwound Skin Texture: Edema: No N/A N/A Excoriation: No Induration: No Callus: No Crepitus: No Fluctuance: No Friable: No Rash: No Scarring: No Periwound Skin Moist: Yes N/A N/A Moisture: Maceration: No Dry/Scaly: No Periwound Skin Color: Atrophie Blanche: No N/A N/A Cyanosis: No Ecchymosis: No Erythema: No Hemosiderin Staining: No Mottled: No Pallor: No Rubor: No Temperature: No Abnormality N/A N/A Tenderness on No N/A N/A Palpation: Wound Preparation: Ulcer Cleansing: N/A N/A Rinsed/Irrigated with Saline Topical Anesthetic Applied: Other: lidocaine 4% Treatment Notes Electronic Signature(s) Signed: 04/07/2015 3:06:32 PM By: Regan Lemming BSN, RN Entered By: Regan Lemming on 04/07/2015 15:06:32 Foell,  [name] -------------------------------------------------------------------------------- Multi-Disciplinary Care Plan Details Patient Name: [name], Amy A. Date of Service: 04/07/2015 3:00 PM Medical Record Number: [id number] Patient Account Number: [id number] Date of Birth/Sex: 05-13-1956 (59 yPearsono. Female) Treating RN: Afful, RN, BSN, Velva Harman Primary Care Physician: Frazier Richards Other Clinician: Referring Physician: Frazier Richards Treating Physician/Extender: Frann Rider in Treatment: 1 Active Inactive Malignancy/Atypical Etiology Nursing Diagnoses: Knowledge deficit related to disease process and management of malignancy Goals: Patient/caregiver will verbalize understanding of disease process and disease management of malignancy Date Initiated: 03/31/2015 Goal Status: Active Interventions: Assess patient and family medical history for signs and symptoms of malignancy/atypical etiology upon admission Provide education on malignant ulcerations Notes: Orientation to the Wound Care Program Nursing Diagnoses: Knowledge deficit related to the wound healing center program Goals: Patient/caregiver will verbalize understanding of the [facility] Date Initiated: 03/31/2015 Goal Status: Active Interventions: Provide education on orientation to the wound center Notes: Wound/Skin Impairment Nursing Diagnoses: Impaired tissue integrity Goals: [name], Amy A. ([id number]) Ulcer/skin breakdown will have a volume reduction of 30% by week 4 Date Initiated: 03/31/2015 Goal Status: Active Ulcer/skin breakdown will have a volume reduction of 50% by week 8 Date Initiated: 03/31/2015 Goal Status: Active Ulcer/skin breakdown will have a volume reduction of 80% by week 12 Date Initiated: 03/31/2015 Goal Status: Active Ulcer/skin breakdown will heal within 14 weeks Date Initiated: 03/31/2015 Goal Status: Active Interventions: Assess  ulceration(s) every visit Notes: Electronic Signature(s) Signed: 04/07/2015 3:06:25 PM By: Regan Lemming BSN, RN Entered By: Regan Lemming on 04/07/2015 15:06:25 [name], Amy AMarland Kitchen ([id number]) -------------------------------------------------------------------------------- Pain Assessment Details Patient Name: [name], Amy A. Date of Service: 04/07/2015 3:00 PM Medical Record Number: [id number] Patient Account Number: [id number] Date of Birth/Sex: 07/21/1955 (59 yPearsono. Female) Treating RN: Baruch Gouty, RN, BSN, Velva Harman Primary Care Physician: Frazier Richards Other Clinician: Referring Physician: Frazier Richards Treating Physician/Extender: Frann Rider in Treatment: 1 Active Problems Location of Pain Severity and Description of Pain Patient Has Paino No Site Locations Pain Management and Medication Current Pain Management: Electronic Signature(s) Signed: 04/07/2015 2:59:10 PM By: Regan Lemming BSN, RN Entered By: Regan Lemming on 04/07/2015 14:59:09 [name], [name] ([id number]) -------------------------------------------------------------------------------- Patient/Caregiver Education Details Patient Name: [name] Date of Service: 04/07/2015 3:00 PM Medical Record Number: [id number] Patient Account Number: [id number] Date of Birth/Gender: 22-Sep-1955 (59 yPearsono. Female) Treating RN: Baruch Gouty, RN, BSN, Velva Harman Primary Care Physician: Frazier Richards Other Clinician: Referring Physician: Frazier Richards Treating Physician/Extender: Frann Rider in Treatment: 1 Education Assessment Education Provided To: Patient Education Topics Provided Malignant/Atypical Wounds: Methods: Explain/Verbal Responses: State content correctly Welcome  To [facility]: Methods: Explain/Verbal Responses: State content correctly Electronic Signature(s) Signed: 04/07/2015 3:14:00 PM By: Regan Lemming BSN, RN Entered By: Regan Lemming on 04/07/2015 15:14:00 [name], Atina A.  ([id number]) -------------------------------------------------------------------------------- Wound Assessment Details Patient Name: Palinkas, Vieva A. Date of Service: 04/07/2015 3:00 PM Medical Record Number: [id number] Patient Account Number: [id number] Date of Birth/Sex: 13-Feb-1956 (59 yPearsono. Female) Treating RN: Afful, RN, BSN, [place] Primary Care Physician: Frazier Richards Other Clinician: Referring Physician: Frazier Richards Treating Physician/Extender: Frann Rider in Treatment: 1 Wound Status Wound Number: 1 Primary Malignant Wound Etiology: Wound Location: Right Breast Wound Open Wounding Event: Gradually Appeared Status: Date Acquired: 06/02/2014 Comorbid Hypertension, Myocardial Infarction, Weeks Of Treatment: 1 History: Received Chemotherapy, Received Clustered Wound: No Radiation Photos Photo Uploaded By: Regan Lemming on 04/07/2015 16:49:03 Wound Measurements Length: (cm) 9Pearson5 Width: (cm) 6Pearson3 Depth: (cm) 0Pearson2 Area: (cm) 47Pearson006 Volume: (cm) 9Pearson401 % Reduction in Area: 8Pearson9% % Reduction in Volume: 8Pearson9% Epithelialization: None Tunneling: No Undermining: No Wound Description Full Thickness Without Exposed Classification: Support Structures Wound Margin: Flat and Intact Exudate Medium Amount: Exudate Type: Serous Exudate Color: amber Foul Odor After Cleansing: No Wound Bed Granulation Amount: Large (67-100%) Exposed Structure Granulation Quality: Red Fascia Exposed: No Ferre, Trinia A. ([id number]) Necrotic Amount: Small (1-33%) Fat Layer Exposed: No Necrotic Quality: Adherent Slough Tendon Exposed: No Muscle Exposed: No Joint Exposed: No Bone Exposed: No Limited to Skin Breakdown Periwound Skin Texture Texture Color No Abnormalities Noted: No No Abnormalities Noted: No Callus: No Atrophie Blanche: No Crepitus: No Cyanosis: No Excoriation: No Ecchymosis: No Fluctuance: No Erythema: No Friable: No Hemosiderin Staining:  No Induration: No Mottled: No Localized Edema: No Pallor: No Rash: No Rubor: No Scarring: No Temperature / Pain Moisture Temperature: No Abnormality No Abnormalities Noted: No Dry / Scaly: No Maceration: No Moist: Yes Wound Preparation Ulcer Cleansing: Rinsed/Irrigated with Saline Topical Anesthetic Applied: Other: lidocaine 4%, Treatment Notes Wound #1 (Right Breast) 1. Cleansed with: Clean wound with Normal Saline 4. Dressing Applied: Aquacel Ag 5. Secondary Dressing Applied Bordered Foam Dressing Electronic Signature(s) Signed: 04/07/2015 3:02:56 PM By: Regan Lemming BSN, RN Entered By: Regan Lemming on 04/07/2015 15:02:56 Labrie, [name] ([id number]) -------------------------------------------------------------------------------- Vitals Details Patient Name: Fichter, Lujuana A. Date of Service: 04/07/2015 3:00 PM Medical Record Number: [id number] Patient Account Number: [id number] Date of Birth/Sex: 1955/11/09 (59 yPearsono. Female) Treating RN: Afful, RN, BSN, [place] Primary Care Physician: Frazier Richards Other Clinician: Referring Physician: Frazier Richards Treating Physician/Extender: Frann Rider in Treatment: 1 Vital Signs Time Taken: 15:03 Temperature (F): 98 Height (in): 65 Pulse (bpm): 84 Weight (lbs): 340 Respiratory Rate (breaths/min): 18 Body Mass Index (BMI): 56Pearson6 Blood Pressure (mmHg): 145/74 Reference Range: 80 - 120 mg / dl Electronic Signature(s) Signed: 04/07/2015 3:03:36 PM By: Regan Lemming BSN, RN Entered By: Regan Lemming on 04/07/2015 15:03:36

## 2015-04-08 NOTE — Progress Notes (Signed)
TUCKER, STEEDLEY ([id number]) Visit Report for 04/07/2015 Chief Complaint Document Details Patient Name: [name], [name] 04/07/2015 3:00 Date of Service: PM Medical Record [id number] Number: Patient Account Number: [id number] 09-25-1955 (59 y.o. Treating RN: Baruch Gouty, RN, BSN, Velva Harman Date of Birth/Sex: Female) Other Clinician: Primary Care Physician: Frazier Richards Treating Christin Fudge Referring Physician: Frazier Richards Physician/Extender: Suella Grove in Treatment: 1 Information Obtained from: Patient Chief Complaint Patient presents to the wound care center for a consult due non healing wound. the patient has an open wound on the right breast for about 3 years. Electronic Signature(s) Signed: 04/07/2015 3:34:32 PM By: Christin Fudge MD, FACS Entered By: Christin Fudge on 04/07/2015 15:34:32 Ensz, Real Cons ([id number]) -------------------------------------------------------------------------------- HPI Details Patient Name: Amy Felty A. 04/07/2015 3:00 Date of Service: PM Medical Record [id number] Number: Patient Account Number: [id number] 1955-10-11 (59 y.o. Treating RN: Baruch Gouty, RN, BSN, Velva Harman Date of Birth/Sex: Female) Other Clinician: Primary Care Physician: Frazier Richards Treating Christin Fudge Referring Physician: Frazier Richards Physician/Extender: Suella Grove in Treatment: 1 History of Present Illness Location: open wound on the right breast Quality: Patient reports experiencing heaviness to affected area(s). Severity: Patient states wound (s) are getting better. Duration: Patient has had the wound for > 3 years prior to seeking treatment at the wound center Timing: Pain in wound is Intermittent (comes and goes Context: The wound occurred when the patient had a breast lump and did not take treatment for this. Modifying Factors: has been taking hormonal therapy and chemotherapy Associated Signs and Symptoms: Patient reports having no foul odor. HPI  Description: 59 year old patient who comes to see as referred by her medical oncologist Dr. 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 She has also had lymphedema of her right arm and has been treated with hormonal treatments since 2014. Her past medical history is significant for hypertension, myocardial infarction, sleep apnea, morbid obesity and has had procedures for coronary angioplasty, medical hernia repair, Port-A-Cath placement. She is a former smoker and has given up smoking in 2006. Patient is on active management with chemotherapy and has been referred to as for management of the wound. Electronic Signature(s) Signed: 04/07/2015 3:34:37 PM By: Christin Fudge MD, FACS Entered By: Christin Fudge on 04/07/2015 15:34:37 Verba, Real Cons ([id number]) -------------------------------------------------------------------------------- Physical Exam Details Patient Name: Amy Felty A. 04/07/2015 3:00 Date of Service: PM Medical Record [id number] Number: Patient Account Number: [id number] 02-15-1956 (59 y.o. Treating RN: Baruch Gouty, RN, BSN, Velva Harman Date of Birth/Sex: Female) Other Clinician: Primary Care Physician: Frazier Richards Treating Christin Fudge Referring Physician: Frazier Richards Physician/Extender: Suella Grove in Treatment: 1 Constitutional . Pulse regular. Respirations normal and unlabored. Afebrile. . Eyes Nonicteric. Reactive to light. Ears, Nose, Mouth, and Throat Lips, teeth, and gums WNL.Marland Kitchen Moist mucosa without lesions . Neck supple and nontender. No palpable supraclavicular or cervical adenopathy. Normal sized without goiter. Respiratory WNL. No retractions.. Cardiovascular Pedal Pulses WNL. No clubbing, cyanosis or edema. Lymphatic No adneopathy. No adenopathy. No  adenopathy. Musculoskeletal Adexa without tenderness or enlargement.. Digits and nails w/o clubbing, cyanosis, infection, petechiae, ischemia, or inflammatory conditions.. Integumentary (Hair, Skin) No suspicious lesions. No crepitus or fluctuance. No peri-wound warmth or erythema. No masses.Marland Kitchen Psychiatric Judgement and insight Intact.. No evidence of depression, anxiety, or agitation.. Notes The wound has not changed much except looks cleaner and there is minimal slough. No debridement  was required. Electronic Signature(s) Signed: 04/07/2015 3:35:18 PM By: Christin Fudge MD, FACS Entered By: Christin Fudge on 04/07/2015 15:35:18 Catapano, Real Cons ([id number]) -------------------------------------------------------------------------------- Physician Orders Details Patient Name: Amy Felty A. 04/07/2015 3:00 Date of Service: PM Medical Record [id number] Number: Patient Account Number: [id number] Aug 30, 1955 (59 y.o. Treating RN: Baruch Gouty, RN, BSN, Velva Harman Date of Birth/Sex: Female) Other Clinician: Primary Care Physician: Frazier Richards Treating Christin Fudge Referring Physician: Frazier Richards Physician/Extender: Suella Grove in Treatment: 1 Verbal / Phone Orders: Yes Clinician: Afful, RN, BSN, Rita Read Back and Verified: Yes Diagnosis Coding Wound Cleansing Wound #1 Right Breast o Clean wound with Normal Saline. o May Shower, gently pat wound dry prior to applying new dressing. Primary Wound Dressing Wound #1 Right Breast o Aquacel Ag Secondary Dressing Wound #1 Right Breast o Boardered Foam Dressing Dressing Change Frequency Wound #1 Right Breast o Change dressing every other day. Follow-up Appointments Wound #1 Right Breast o Return Appointment in 1 week. Electronic Signature(s) Signed: 04/07/2015 3:12:15 PM By: Regan Lemming BSN, RN Signed: 04/07/2015 4:43:21 PM By: Christin Fudge MD, FACS Entered By: Regan Lemming on 04/07/2015 15:12:15 Auguste, Real Cons  ([id number]) -------------------------------------------------------------------------------- Problem List Details Patient Name: [name], [name] 04/07/2015 3:00 Date of Service: PM Medical Record [id number] Number: Patient Account Number: [id number] 1956/05/13 (59 y.o. Treating RN: Baruch Gouty, RN, BSN, Velva Harman Date of Birth/Sex: Female) Other Clinician: Primary Care Physician: Frazier Richards Treating Christin Fudge Referring Physician: Frazier Richards Physician/Extender: Suella Grove in Treatment: 1 Active Problems ICD-10 Encounter Code Description Active Date Diagnosis S21.001A Unspecified open wound of right breast, initial encounter 03/31/2015 Yes C50.411 Malignant neoplasm of upper-outer quadrant of right 03/31/2015 Yes female breast E66.01 Morbid (severe) obesity due to excess calories 03/31/2015 Yes Inactive Problems Resolved Problems Electronic Signature(s) Signed: 04/07/2015 3:34:26 PM By: Christin Fudge MD, FACS Entered By: Christin Fudge on 04/07/2015 15:34:26 Mccormack, Real Cons ([id number]) -------------------------------------------------------------------------------- Progress Note Details Patient Name: Amy Felty A. 04/07/2015 3:00 Date of Service: PM Medical Record [id number] Number: Patient Account Number: [id number] 08/09/1955 (59 y.o. Treating RN: Baruch Gouty, RN, BSN, Velva Harman Date of Birth/Sex: Female) Other Clinician: Primary Care Physician: Frazier Richards Treating Christin Fudge Referring Physician: Frazier Richards Physician/Extender: Suella Grove in Treatment: 1 Subjective Chief Complaint Information obtained from Patient Patient presents to the wound care center for a consult due non healing wound. the patient has an open wound on the right breast for about 3 years. History of Present Illness (HPI) The following HPI elements were documented for the patient's wound: Location: open wound on the right breast Quality: Patient reports experiencing heaviness to  affected area(s). Severity: Patient states wound (s) are getting better. Duration: Patient has had the wound for > 3 years prior to seeking treatment at the wound center Timing: Pain in wound is Intermittent (comes and goes Context: The wound occurred when the patient had a breast lump and did not take treatment for this. Modifying Factors: has been taking hormonal therapy and chemotherapy Associated Signs and Symptoms: Patient reports having no foul odor. 59 year old patient who comes to see as referred by her medical oncologist Dr. 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 She has also had lymphedema of her right arm and has been treated with hormonal treatments since 2014. Her past medical history is significant for hypertension, myocardial infarction, sleep apnea, morbid obesity and has  had procedures for coronary angioplasty, medical hernia repair, Port-A-Cath placement. She is a former smoker and has given up smoking in 2006. Patient is on active management with chemotherapy and has been referred to as for management of the wound. Objective Constitutional Pulse regular. Respirations normal and unlabored. Afebrile. Seel, Avanthika A. ([id number]) Vitals Time Taken: 3:03 PM, Height: 65 in, Weight: 340 lbs, BMI: 56.6, Temperature: 98 F, Pulse: 84 bpm, Respiratory Rate: 18 breaths/min, Blood Pressure: 145/74 mmHg. Eyes Nonicteric. Reactive to light. Ears, Nose, Mouth, and Throat Lips, teeth, and gums WNL.Marland Kitchen Moist mucosa without lesions . Neck supple and nontender. No palpable supraclavicular or cervical adenopathy. Normal sized without goiter. Respiratory WNL. No retractions.. Cardiovascular Pedal Pulses WNL. No clubbing, cyanosis or edema. Lymphatic No adneopathy. No adenopathy. No  adenopathy. Musculoskeletal Adexa without tenderness or enlargement.. Digits and nails w/o clubbing, cyanosis, infection, petechiae, ischemia, or inflammatory conditions.Marland Kitchen Psychiatric Judgement and insight Intact.. No evidence of depression, anxiety, or agitation.. General Notes: The wound has not changed much except looks cleaner and there is minimal slough. No debridement was required. Integumentary (Hair, Skin) No suspicious lesions. No crepitus or fluctuance. No peri-wound warmth or erythema. No masses.. Wound #1 status is Open. Original cause of wound was Gradually Appeared. The wound is located on the Right Breast. The wound measures 9.5cm length x 6.3cm width x 0.2cm depth; 47.006cm^2 area and 9.401cm^3 volume. The wound is limited to skin breakdown. There is no tunneling or undermining noted. There is a medium amount of serous drainage noted. The wound margin is flat and intact. There is large (67-100%) red granulation within the wound bed. There is a small (1-33%) amount of necrotic tissue within the wound bed including Adherent Slough. The periwound skin appearance exhibited: Moist. The periwound skin appearance did not exhibit: Callus, Crepitus, Excoriation, Fluctuance, Friable, Induration, Localized Edema, Rash, Scarring, Dry/Scaly, Maceration, Atrophie Blanche, Cyanosis, Ecchymosis, Hemosiderin Staining, Mottled, Pallor, Rubor, Erythema. Periwound temperature was noted as No Abnormality. Goetzinger, Tahiri A. ([id number]) Assessment Active Problems ICD-10 S21.001A - Unspecified open wound of right breast, initial encounter C50.411 - Malignant neoplasm of upper-outer quadrant of right female breast E66.01 - Morbid (severe) obesity due to excess calories I have recommended he continue with silver alginate and aborted foam dressing for this week. She may benefit from application of Cutimed Sorbact with foam. We may consider this next week. Plan Wound Cleansing: Wound #1 Right  Breast: Clean wound with Normal Saline. May Shower, gently pat wound dry prior to applying new dressing. Primary Wound Dressing: Wound #1 Right Breast: Aquacel Ag Secondary Dressing: Wound #1 Right Breast: Boardered Foam Dressing Dressing Change Frequency: Wound #1 Right Breast: Change dressing every other day. Follow-up Appointments: Wound #1 Right Breast: Return Appointment in 1 week. I have recommended he continue with silver alginate and aborted foam dressing for this week. She may benefit from application of Cutimed Sorbact with foam. We may consider this next week. Electronic Signature(s) Signed: 04/07/2015 3:36:21 PM By: Christin Fudge MD, FACS Latella, Real Cons ([id number]) Entered By: Christin Fudge on 04/07/2015 15:36:21 Lindell, Real Cons ([id number]) -------------------------------------------------------------------------------- SuperBill Details Patient Name: Beauchaine, Delene A. Date of Service: 04/07/2015 Medical Record Patient Account Number: [id number] Number: Afful, RN, BSN, Treating RN: 1955/12/08 (59 y.o. Velva Harman Date of Birth/Sex: Female) Other Clinician: Primary Care Physician: Frazier Richards Treating Christin Fudge Referring Physician: Frazier Richards Physician/Extender: Suella Grove in Treatment: 1 Diagnosis Coding ICD-10 Codes Code Description K93.267T Unspecified open wound of right breast, initial encounter C50.411 Malignant neoplasm of upper-outer quadrant  of right female breast E66.01 Morbid (severe) obesity due to excess calories Facility Procedures CPT4 Code: [id number] Description: [phone] - WOUND CARE VISIT-LEV 2 EST PT Modifier: Quantity: 1 Physician Procedures CPT4 Code Description: [id number] - WC PHYS LEVEL 3 - EST PT ICD-10 Description Diagnosis S21.001A Unspecified open wound of right breast, initial C50.411 Malignant neoplasm of upper-outer quadrant of ri E66.01 Morbid (severe) obesity due to excess  calories Modifier:  encounter ght female breas Quantity: 1 t Electronic Signature(s) Signed: 04/07/2015 3:38:46 PM By: Christin Fudge MD, FACS Previous Signature: 04/07/2015 3:38:32 PM Version By: Christin Fudge MD, FACS Entered By: Christin Fudge on 04/07/2015 15:38:46

## 2015-04-13 ENCOUNTER — Inpatient Hospital Stay: Payer: BC Managed Care – PPO

## 2015-04-13 ENCOUNTER — Encounter: Payer: Self-pay | Admitting: *Deleted

## 2015-04-13 VITALS — BP 135/83 | HR 105 | Temp 98.0°F | Resp 18

## 2015-04-13 DIAGNOSIS — C50911 Malignant neoplasm of unspecified site of right female breast: Secondary | ICD-10-CM

## 2015-04-13 DIAGNOSIS — Z5111 Encounter for antineoplastic chemotherapy: Secondary | ICD-10-CM | POA: Diagnosis not present

## 2015-04-13 DIAGNOSIS — C7951 Secondary malignant neoplasm of bone: Principal | ICD-10-CM

## 2015-04-13 LAB — CBC WITH DIFFERENTIAL/PLATELET
BASOS PCT: 1 %
Basophils Absolute: 0.1 10*3/uL (ref 0–0.1)
EOS ABS: 0.2 10*3/uL (ref 0–0.7)
EOS PCT: 3 %
HCT: 32.6 % — ABNORMAL LOW (ref 35.0–47.0)
HEMOGLOBIN: 10.7 g/dL — AB (ref 12.0–16.0)
Lymphocytes Relative: 19 %
Lymphs Abs: 1.8 10*3/uL (ref 1.0–3.6)
MCH: 27.6 pg (ref 26.0–34.0)
MCHC: 32.8 g/dL (ref 32.0–36.0)
MCV: 83.9 fL (ref 80.0–100.0)
Monocytes Absolute: 0.4 10*3/uL (ref 0.2–0.9)
Monocytes Relative: 4 %
NEUTROS PCT: 73 %
Neutro Abs: 6.8 10*3/uL — ABNORMAL HIGH (ref 1.4–6.5)
PLATELETS: 309 10*3/uL (ref 150–440)
RBC: 3.89 MIL/uL (ref 3.80–5.20)
RDW: 23.1 % — ABNORMAL HIGH (ref 11.5–14.5)
WBC: 9.3 10*3/uL (ref 3.6–11.0)

## 2015-04-13 LAB — COMPREHENSIVE METABOLIC PANEL
ALK PHOS: 61 U/L (ref 38–126)
ALT: 17 U/L (ref 14–54)
AST: 22 U/L (ref 15–41)
Albumin: 3.4 g/dL — ABNORMAL LOW (ref 3.5–5.0)
Anion gap: 7 (ref 5–15)
BUN: 13 mg/dL (ref 6–20)
CALCIUM: 8.4 mg/dL — AB (ref 8.9–10.3)
CO2: 24 mmol/L (ref 22–32)
CREATININE: 0.65 mg/dL (ref 0.44–1.00)
Chloride: 106 mmol/L (ref 101–111)
GFR calc non Af Amer: >60 mL/min (ref >60)
Glucose, Bld: 160 mg/dL — ABNORMAL HIGH (ref 65–99)
Potassium: 3.7 mmol/L (ref 3.5–5.1)
SODIUM: 137 mmol/L (ref 135–145)
TOTAL PROTEIN: 6.8 g/dL (ref 6.5–8.1)
Total Bilirubin: 0.5 mg/dL (ref 0.3–1.2)

## 2015-04-13 MED ORDER — HEPARIN SOD (PORK) LOCK FLUSH 100 UNIT/ML IV SOLN
INTRAVENOUS | Status: AC
  Filled 2015-04-13: qty 5

## 2015-04-13 MED ORDER — FAMOTIDINE IN NACL 20-0.9 MG/50ML-% IV SOLN
20.0000 mg | Freq: Once | INTRAVENOUS | Status: AC
Start: 2015-04-13 — End: 2015-04-13
  Administered 2015-04-13: 20 mg via INTRAVENOUS
  Filled 2015-04-13: qty 50

## 2015-04-13 MED ORDER — PACLITAXEL CHEMO INJECTION 300 MG/50ML
90.0000 mg/m2 | Freq: Once | INTRAVENOUS | Status: AC
  Administered 2015-04-13: 240 mg via INTRAVENOUS
  Filled 2015-04-13: qty 40

## 2015-04-13 MED ORDER — SODIUM CHLORIDE 0.9 % IJ SOLN
10.0000 mL | INTRAMUSCULAR | Status: DC | PRN
  Administered 2015-04-13: 10 mL
  Filled 2015-04-13: qty 10

## 2015-04-13 MED ORDER — HEPARIN SOD (PORK) LOCK FLUSH 100 UNIT/ML IV SOLN
500.0000 [IU] | Freq: Once | INTRAVENOUS | Status: AC | PRN
  Administered 2015-04-13: 500 [IU]

## 2015-04-13 MED ORDER — DIPHENHYDRAMINE HCL 50 MG/ML IJ SOLN
50.0000 mg | Freq: Once | INTRAMUSCULAR | Status: AC
Start: 2015-04-13 — End: 2015-04-13
  Administered 2015-04-13: 50 mg via INTRAVENOUS
  Filled 2015-04-13: qty 1

## 2015-04-13 MED ORDER — SODIUM CHLORIDE 0.9 % IV SOLN
Freq: Once | INTRAVENOUS | Status: AC
  Administered 2015-04-13: 14:00:00 via INTRAVENOUS
  Filled 2015-04-13: qty 4

## 2015-04-13 MED ORDER — SODIUM CHLORIDE 0.9 % IV SOLN
Freq: Once | INTRAVENOUS | Status: AC
  Administered 2015-04-13: 14:00:00 via INTRAVENOUS
  Filled 2015-04-13: qty 1000

## 2015-04-13 NOTE — Progress Notes (Signed)
04/13/15 @13 :40pm [name] returns to clinic today for consideration of Cycle 3 Day 8 Taxol infusion. She reports that she is doing well and denies experiencing any new adverse events. Reviewed most recent phone PRO-CTCAE with patient. States she is experiencing some constipation - about the same as before, and states her fatigue may be a little worse, but that it does not interfere with her ADLs. Denies any new problems related to her lymphedema. VS are stable. CBC is within acceptable parameters for patient to receive her treatment today. Will proceed with C3 D8 Taxol infusion as scheduled. [name] requests to have the location of her CT & Bone scans changed to the [address] location. Request made with schedulers to change this if possible. Treatment orders were signed by Georgeanne Nim, NP. Current AEs with grade and attribution are as follows:      Alopecia - grade 2; definitely attributed to Taxol Lymphedema - grade 2; unrelated (baseline) Fatigue - grade 1; definitely related    Constipation - grade 1; possibly related

## 2015-04-14 ENCOUNTER — Other Ambulatory Visit: Payer: Self-pay | Admitting: Family Medicine

## 2015-04-14 ENCOUNTER — Ambulatory Visit: Payer: BC Managed Care – PPO | Admitting: Surgery

## 2015-04-14 LAB — CEA: CEA: 14.7 ng/mL — ABNORMAL HIGH (ref 0.0–4.7)

## 2015-04-16 ENCOUNTER — Encounter: Payer: BC Managed Care – PPO | Admitting: Surgery

## 2015-04-16 DIAGNOSIS — S21001A Unspecified open wound of right breast, initial encounter: Secondary | ICD-10-CM | POA: Diagnosis not present

## 2015-04-16 NOTE — Progress Notes (Addendum)
MICHAELYN, WALL ([id number]) Visit Report for 04/16/2015 Chief Complaint Document Details Patient Name: [name], [name] 04/16/2015 8:45 Date of Service: AM Medical Record [id number] Number: Patient Account Number: [id number] 16-May-1956 (59 y.o. Treating RN: Date of Birth/Sex: Female) Other Clinician: Primary Care Physician: Frazier Richards Treating Christin Fudge Referring Physician: Frazier Richards Physician/Extender: Suella Grove in Treatment: 2 Information Obtained from: Patient Chief Complaint Patient presents to the wound care center for a consult due non healing wound. the patient has an open wound on the right breast for about 3 years. Electronic Signature(s) Signed: 04/16/2015 9:04:55 AM By: Christin Fudge MD, FACS Entered By: Christin Fudge on 04/16/2015 09:04:55 [place], Real Cons ([id number]) -------------------------------------------------------------------------------- HPI Details Patient Name: Amy Felty A. 04/16/2015 8:45 Date of Service: AM Medical Record [id number] Number: Patient Account Number: [id number] 06-Dec-1955 (59 y.o. Treating RN: Date of Birth/Sex: Female) Other Clinician: Primary Care Physician: Frazier Richards Treating Christin Fudge Referring Physician: Frazier Richards Physician/Extender: Suella Grove in Treatment: 2 History of Present Illness Location: open wound on the right breast Quality: Patient reports experiencing heaviness to affected area(s). Severity: Patient states wound (s) are getting better. Duration: Patient has had the wound for > 3 years prior to seeking treatment at the wound center Timing: Pain in wound is Intermittent (comes and goes Context: The wound occurred when the patient had a breast lump and did not take treatment for this. Modifying Factors: has been taking hormonal therapy and chemotherapy Associated Signs and Symptoms: Patient reports having no foul odor. HPI Description: 59 year old patient who comes to  see as referred by her medical oncologist Dr. 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 She has also had lymphedema of her right arm and has been treated with hormonal treatments since 2014. Her past medical history is significant for hypertension, myocardial infarction, sleep apnea, morbid obesity and has had procedures for coronary angioplasty, medical hernia repair, Port-A-Cath placement. She is a former smoker and has given up smoking in 2006. Patient is on active management with chemotherapy and has been referred to as for management of the wound. Electronic Signature(s) Signed: 04/16/2015 9:04:59 AM By: Christin Fudge MD, FACS Entered By: Christin Fudge on 04/16/2015 09:04:59 [place], Real Cons ([id number]) -------------------------------------------------------------------------------- Physical Exam Details Patient Name: [name], [name] 04/16/2015 8:45 Date of Service: AM Medical Record [id number] Number: Patient Account Number: [id number] January 06, 1956 (59 y.o. Treating RN: Date of Birth/Sex: Female) Other Clinician: Primary Care Physician: Frazier Richards Treating Christin Fudge Referring Physician: Frazier Richards Physician/Extender: Suella Grove in Treatment: 2 Constitutional . Pulse regular. Respirations normal and unlabored. Afebrile. . Eyes Nonicteric. Reactive to light. Ears, Nose, Mouth, and Throat Lips, teeth, and gums WNL.Marland Kitchen Moist mucosa without lesions . Neck supple and nontender. No palpable supraclavicular or cervical adenopathy. Normal sized without goiter. Respiratory WNL. No retractions.. Cardiovascular Pedal Pulses WNL. No clubbing, cyanosis or edema. Chest the right axilla seems to be fine without any evidence of ulceration. . Breast tissue WNL, no masses, lumps, or  tenderness.. Lymphatic No adneopathy. No adenopathy. No adenopathy. Musculoskeletal Adexa without tenderness or enlargement.. Digits and nails w/o clubbing, cyanosis, infection, petechiae, ischemia, or inflammatory conditions.. Integumentary (Hair, Skin) No suspicious lesions. No crepitus or fluctuance. No peri-wound warmth or erythema. No masses.Marland Kitchen Psychiatric Judgement and insight Intact.. No evidence of depression, anxiety, or agitation.. Notes The wound on the right upper  outer quadrant of the breast has minimal slough and no surrounding erythema or cellulitis. No debridement was done. Electronic Signature(s) Signed: 04/16/2015 9:06:02 AM By: Christin Fudge MD, FACS Entered By: Christin Fudge on 04/16/2015 09:06:01 Hensler, Real Cons ([id number]) -------------------------------------------------------------------------------- Physician Orders Details Patient Name: [name], [name] 04/16/2015 8:45 Date of Service: AM Medical Record [id number] Number: Patient Account Number: [id number] Sep 11, 1955 (59 y.o. Treating RN: Cornell Barman Date of Birth/Sex: Female) Other Clinician: Primary Care Physician: Frazier Richards Treating Christin Fudge Referring Physician: Frazier Richards Physician/Extender: Suella Grove in Treatment: 2 Verbal / Phone Orders: Yes Clinician: Cornell Barman Read Back and Verified: Yes Diagnosis Coding Wound Cleansing Wound #1 Right Breast o Clean wound with Normal Saline. o May Shower, gently pat wound dry prior to applying new dressing. Primary Wound Dressing Wound #1 Right Breast o Other: - Cultimed Siltec Sorbact 5 x 5 Dressing Change Frequency Wound #1 Right Breast o Change dressing every other day. - or as needed Follow-up Appointments Wound #1 Right Breast o Return Appointment in 1 month Electronic Signature(s) Signed: 04/16/2015 10:22:26 AM By: Gretta Cool RN, BSN, Kim RN, BSN Signed: 04/16/2015 4:31:29 PM By: Christin Fudge MD, FACS Entered By:  Gretta Cool RN, BSN, Kim on 04/16/2015 09:06:23 Villalp[place], Real Cons ([id number]) -------------------------------------------------------------------------------- Problem List Details Patient Name: [name], [name] 04/16/2015 8:45 Date of Service: AM Medical Record [id number] Number: Patient Account Number: [id number] 03/17/1956 (59 y.o. Treating RN: Date of Birth/Sex: Female) Other Clinician: Primary Care Physician: Frazier Richards Treating Christin Fudge Referring Physician: Frazier Richards Physician/Extender: Suella Grove in Treatment: 2 Active Problems ICD-10 Encounter Code Description Active Date Diagnosis S21.001A Unspecified open wound of right breast, initial encounter 03/31/2015 Yes C50.411 Malignant neoplasm of upper-outer quadrant of right 03/31/2015 Yes female breast E66.01 Morbid (severe) obesity due to excess calories 03/31/2015 Yes Inactive Problems Resolved Problems Electronic Signature(s) Signed: 04/16/2015 9:04:49 AM By: Christin Fudge MD, FACS Entered By: Christin Fudge on 04/16/2015 09:04:49 Dow, Real Cons ([id number]) -------------------------------------------------------------------------------- Progress Note Details Patient Name: Amy Felty A. 04/16/2015 8:45 Date of Service: AM Medical Record [id number] Number: Patient Account Number: [id number] [birth year] (59 y.o. Treating RN: Date of Birth/Sex: Female) Other Clinician: Primary Care Physician: Frazier Richards Treating Christin Fudge Referring Physician: Frazier Richards Physician/Extender: Suella Grove in Treatment: 2 Subjective Chief Complaint Information obtained from Patient Patient presents to the wound care center for a consult due non healing wound. the patient has an open wound on the right breast for about 3 years. History of Present Illness (HPI) The following HPI elements were documented for the patient's wound: Location: open wound on the right breast Quality: Patient reports  experiencing heaviness to affected area(s). Severity: Patient states wound (s) are getting better. Duration: Patient has had the wound for > 3 years prior to seeking treatment at the wound center Timing: Pain in wound is Intermittent (comes and goes Context: The wound occurred when the patient had a breast lump and did not take treatment for this. Modifying Factors: has been taking hormonal therapy and chemotherapy Associated Signs and Symptoms: Patient reports having no foul odor. 59 year old patient who comes to see as referred by her medical oncologist Dr. 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 She has also had lymphedema of her right arm and has been treated with hormonal treatments since 2014. Her past medical history is significant for hypertension,  myocardial infarction, sleep apnea, morbid obesity and has had procedures for coronary angioplasty, medical hernia repair, Port-A-Cath placement. She is a former smoker and has given up smoking in 2006. Patient is on active management with chemotherapy and has been referred to as for management of the wound. Objective Constitutional Pulse regular. Respirations normal and unlabored. Afebrile. [name], Amy A. ([id number]) Vitals Time Taken: 8:48 AM, Height: 65 in, Weight: 340 lbs, BMI: 56.6, Temperature: 98.3 F, Pulse: 86 bpm, Respiratory Rate: 18 breaths/min, Blood Pressure: 157/80 mmHg. Eyes Nonicteric. Reactive to light. Ears, Nose, Mouth, and Throat Lips, teeth, and gums WNL.Marland Kitchen Moist mucosa without lesions . Neck supple and nontender. No palpable supraclavicular or cervical adenopathy. Normal sized without goiter. Respiratory WNL. No retractions.. Cardiovascular Pedal Pulses WNL. No clubbing, cyanosis or edema. Chest the right axilla seems to be  fine without any evidence of ulceration. . Breast tissue WNL, no masses, lumps, or tenderness.. Lymphatic No adneopathy. No adenopathy. No adenopathy. Musculoskeletal Adexa without tenderness or enlargement.. Digits and nails w/o clubbing, cyanosis, infection, petechiae, ischemia, or inflammatory conditions.Marland Kitchen Psychiatric Judgement and insight Intact.. No evidence of depression, anxiety, or agitation.. General Notes: The wound on the right upper outer quadrant of the breast has minimal slough and no surrounding erythema or cellulitis. No debridement was done. Integumentary (Hair, Skin) No suspicious lesions. No crepitus or fluctuance. No peri-wound warmth or erythema. No masses.. Wound #1 status is Open. Original cause of wound was Gradually Appeared. The wound is located on the Right Breast. The wound measures 8.5cm length x 7cm width x 0.2cm depth; 46.731cm^2 area and 9.346cm^3 volume. The wound is limited to skin breakdown. There is a medium amount of serous drainage noted. The wound margin is flat and intact. There is large (67-100%) red granulation within the wound bed. There is a small (1-33%) amount of necrotic tissue within the wound bed including Adherent Slough. The periwound skin appearance exhibited: Moist. The periwound skin appearance did not exhibit: Callus, Crepitus, Excoriation, Fluctuance, Friable, Induration, Localized Edema, Rash, Scarring, Dry/Scaly, Maceration, Atrophie Blanche, Cyanosis, Ecchymosis, Hemosiderin Staining, Mottled, Pallor, Rubor, Erythema. Periwound temperature was noted as No Abnormality. [name], Amy A. ([id number]) Assessment Active Problems ICD-10 S21.001A - Unspecified open wound of right breast, initial encounter C50.411 - Malignant neoplasm of upper-outer quadrant of right female breast E66.01 - Morbid (severe) obesity due to excess calories I have recommended Siltec Sorbact to be changed about twice a week and she can come to see me  for review every 3 weeks or so. She is encouraged to come earlier if there are any issues with the wound or any untoward complications. Plan Wound Cleansing: Wound #1 Right Breast: Clean wound with Normal Saline. May Shower, gently pat wound dry prior to applying new dressing. Primary Wound Dressing: Wound #1 Right Breast: Other: - Cultimed Siltec Sorbact 5 x 5 Dressing Change Frequency: Wound #1 Right Breast: Change dressing every other day. - or as needed Follow-up Appointments: Wound #1 Right Breast: Return Appointment in 1 month I have recommended Siltec Sorbact to be changed about twice a week and she can come to see me for review every 3 weeks or so. She is encouraged to come earlier if there are any issues with the wound or any untoward complications. Electronic Signature(s) [name], [name] ([id number]) Signed: 04/16/2015 9:07:02 AM By: Christin Fudge MD, FACS Entered By: Christin Fudge on 04/16/2015 09:07:02 Stebbins, Real Cons ([id number]) -------------------------------------------------------------------------------- SuperBill Details Patient Name: [name], Amy A. Date of Service: 04/16/2015 Medical Record Number: [id number] Patient  Account Number: [id number] Date of Birth/Sex: [birth year] (59 y.o. Female) Treating RN: Primary Care Physician: Frazier Richards Other Clinician: Referring Physician: Frazier Richards Treating Physician/Extender: Frann Rider in Treatment: 2 Diagnosis Coding ICD-10 Codes Code Description S21.001A Unspecified open wound of right breast, initial encounter C50.411 Malignant neoplasm of upper-outer quadrant of right female breast E66.01 Morbid (severe) obesity due to excess calories Facility Procedures CPT4 Code: [id number] Description: [phone] - WOUND CARE VISIT-LEV 2 EST PT Modifier: Quantity: 1 Physician Procedures CPT4 Code Description: [id number] - WC PHYS LEVEL 3 - EST PT ICD-10 Description Diagnosis S21.001A  Unspecified open wound of right breast, initial e C50.411 Malignant neoplasm of upper-outer quadrant of rig E66.01 Morbid (severe) obesity due to excess  calories Modifier: ncounter ht female brea Quantity: 1 st Electronic Signature(s) Signed: 04/16/2015 9:07:13 AM By: Christin Fudge MD, FACS Entered By: Christin Fudge on 04/16/2015 09:07:13

## 2015-04-16 NOTE — Progress Notes (Signed)
MARIS, ABASCAL ([id number]) Visit Report for 04/16/2015 Arrival Information Details Patient Name: QUERIDA, BERETTA A. Date of Service: 04/16/2015 8:45 AM Medical Record Number: [id number] Patient Account Number: [id number] Date of Birth/Sex: Aug 13, 1955 (59 y.o. Female) Treating RN: Cornell Barman Primary Care Physician: Frazier Richards Other Clinician: Referring Physician: Frazier Richards Treating Physician/Extender: Frann Rider in Treatment: 2 Visit Information History Since Last Visit Added or deleted any medications: No Patient Arrived: Ambulatory Any new allergies or adverse reactions: No Arrival Time: 08:47 Had a fall or experienced change in No Accompanied By: self activities of daily living that may affect Transfer Assistance: None risk of falls: Patient Identification Verified: Yes Signs or symptoms of abuse/neglect since last No Secondary Verification Process Yes visito Completed: Hospitalized since last visit: No Patient Has Alerts: Yes Has Dressing in Place as Prescribed: Yes Patient Alerts: aspirin 81, Pain Present Now: No plavix Electronic Signature(s) Signed: 04/16/2015 10:22:26 AM By: Gretta Cool, RN, BSN, Kim RN, BSN Entered By: Gretta Cool, RN, BSN, Kim on 04/16/2015 08:48:37 Eversley, Real Cons ([id number]) -------------------------------------------------------------------------------- Clinic Level of Care Assessment Details Patient Name: Betts, Nelsy A. Date of Service: 04/16/2015 8:45 AM Medical Record Number: [id number] Patient Account Number: [id number] Date of Birth/Sex: 1955-08-14 (59 y.o. Female) Treating RN: Cornell Barman Primary Care Physician: Frazier Richards Other Clinician: Referring Physician: Frazier Richards Treating Physician/Extender: Frann Rider in Treatment: 2 Clinic Level of Care Assessment Items TOOL 4 Quantity Score []  - Use when only an EandM is performed on FOLLOW-UP visit 0 ASSESSMENTS - Nursing Assessment /  Reassessment []  - Reassessment of Co-morbidities (includes updates in patient status) 0 X - Reassessment of Adherence to Treatment Plan 1 5 ASSESSMENTS - Wound and Skin Assessment / Reassessment X - Simple Wound Assessment / Reassessment - one wound 1 5 []  - Complex Wound Assessment / Reassessment - multiple wounds 0 []  - Dermatologic / Skin Assessment (not related to wound area) 0 ASSESSMENTS - Focused Assessment []  - Circumferential Edema Measurements - multi extremities 0 []  - Nutritional Assessment / Counseling / Intervention 0 []  - Lower Extremity Assessment (monofilament, tuning fork, pulses) 0 []  - Peripheral Arterial Disease Assessment (using hand held doppler) 0 ASSESSMENTS - Ostomy and/or Continence Assessment and Care []  - Incontinence Assessment and Management 0 []  - Ostomy Care Assessment and Management (repouching, etc.) 0 PROCESS - Coordination of Care X - Simple Patient / Family Education for ongoing care 1 15 []  - Complex (extensive) Patient / Family Education for ongoing care 0 X - Staff obtains Programmer, systems, Records, Test Results / Process Orders 1 10 []  - Staff telephones HHA, Nursing Homes / Clarify orders / etc 0 []  - Routine Transfer to another Facility (non-emergent condition) 0 Jacobs, Klea A. ([id number]) []  - Routine Hospital Admission (non-emergent condition) 0 []  - New Admissions / Biomedical engineer / Ordering NPWT, Apligraf, etc. 0 []  - Emergency Hospital Admission (emergent condition) 0 X - Simple Discharge Coordination 1 10 []  - Complex (extensive) Discharge Coordination 0 PROCESS - Special Needs []  - Pediatric / Minor Patient Management 0 []  - Isolation Patient Management 0 []  - Hearing / Language / Visual special needs 0 []  - Assessment of Community assistance (transportation, D/C planning, etc.) 0 []  - Additional assistance / Altered mentation 0 []  - Support Surface(s) Assessment (bed, cushion, seat, etc.) 0 INTERVENTIONS - Wound Cleansing /  Measurement X - Simple Wound Cleansing - one wound 1 5 []  - Complex Wound Cleansing - multiple wounds 0 X - Wound Imaging (photographs - any number of  wounds) 1 5 []  - Wound Tracing (instead of photographs) 0 X - Simple Wound Measurement - one wound 1 5 []  - Complex Wound Measurement - multiple wounds 0 INTERVENTIONS - Wound Dressings X - Small Wound Dressing one or multiple wounds 1 10 []  - Medium Wound Dressing one or multiple wounds 0 []  - Large Wound Dressing one or multiple wounds 0 []  - Application of Medications - topical 0 []  - Application of Medications - injection 0 INTERVENTIONS - Miscellaneous []  - External ear exam 0 Dauzat, Keimani A. ([id number]) []  - Specimen Collection (cultures, biopsies, blood, body fluids, etc.) 0 []  - Specimen(s) / Culture(s) sent or taken to Lab for analysis 0 []  - Patient Transfer (multiple staff / Harrel Lemon Lift / Similar devices) 0 []  - Simple Staple / Suture removal (25 or less) 0 []  - Complex Staple / Suture removal (26 or more) 0 []  - Hypo / Hyperglycemic Management (close monitor of Blood Glucose) 0 []  - Ankle / Brachial Index (ABI) - do not check if billed separately 0 X - Vital Signs 1 5 Has the patient been seen at the hospital within the last three years: Yes Total Score: 75 Level Of Care: New/Established - Level 2 Electronic Signature(s) Signed: 04/16/2015 10:22:26 AM By: Gretta Cool, RN, BSN, Kim RN, BSN Entered By: Gretta Cool, RN, BSN, Kim on 04/16/2015 09:06:58 Sorey, Real Cons ([id number]) -------------------------------------------------------------------------------- Encounter Discharge Information Details Patient Name: Alcaide, Graciella A. Date of Service: 04/16/2015 8:45 AM Medical Record Number: [id number] Patient Account Number: [id number] Date of Birth/Sex: Apr 24, 1956 (58 y.o. Female) Treating RN: Primary Care Physician: Frazier Richards Other Clinician: Referring Physician: Frazier Richards Treating Physician/Extender: Frann Rider in Treatment: 2 Encounter Discharge Information Items Discharge Pain Level: 0 Discharge Condition: Stable Ambulatory Status: Ambulatory Discharge Destination: Home Transportation: Private Auto Accompanied By: self Schedule Follow-up Appointment: Yes Medication Reconciliation completed and provided to Patient/Care Yes Delinda Malan: Provided on Clinical Summary of Care: 04/16/2015 Form Type Recipient Paper Patient ES Electronic Signature(s) Signed: 04/16/2015 10:22:26 AM By: Gretta Cool RN, BSN, Kim RN, BSN Previous Signature: 04/16/2015 9:06:38 AM Version By: Ruthine Dose Entered By: Gretta Cool RN, BSN, Kim on 04/16/2015 09:07:45 Goheen, Real Cons ([id number]) -------------------------------------------------------------------------------- Multi Wound Chart Details Patient Name: Perillo, Marycruz A. Date of Service: 04/16/2015 8:45 AM Medical Record Number: [id number] Patient Account Number: [id number] Date of Birth/Sex: 12-17-1955 (59 y.o. Female) Treating RN: Cornell Barman Primary Care Physician: Frazier Richards Other Clinician: Referring Physician: Frazier Richards Treating Physician/Extender: Frann Rider in Treatment: 2 Vital Signs Height(in): 65 Pulse(bpm): 86 Weight(lbs): 340 Blood Pressure 157/80 (mmHg): Body Mass Index(BMI): 57 Temperature(F): 98.3 Respiratory Rate 18 (breaths/min): Photos: [1:No Photos] [N/A:N/A] Wound Location: [1:Right Breast] [N/A:N/A] Wounding Event: [1:Gradually Appeared] [N/A:N/A] Primary Etiology: [1:Malignant Wound] [N/A:N/A] Comorbid History: [1:Hypertension, Myocardial Infarction, Received Chemotherapy, Received Radiation] [N/A:N/A] Date Acquired: [1:06/02/2014] [N/A:N/A] Weeks of Treatment: [1:2] [N/A:N/A] Wound Status: [1:Open] [N/A:N/A] Measurements L x W x D 8.5x7x0.2 [N/A:N/A] (cm) Area (cm) : [1:46.731] [N/A:N/A] Volume (cm) : [1:9.346] [N/A:N/A] % Reduction in Area: [1:9.40%] [N/A:N/A] % Reduction in  Volume: 9.40% [N/A:N/A] Classification: [1:Full Thickness Without Exposed Support Structures] [N/A:N/A] Exudate Amount: [1:Medium] [N/A:N/A] Exudate Type: [1:Serous] [N/A:N/A] Exudate Color: [1:amber] [N/A:N/A] Wound Margin: [1:Flat and Intact] [N/A:N/A] Granulation Amount: [1:Large (67-100%)] [N/A:N/A] Granulation Quality: [1:Red] [N/A:N/A] Necrotic Amount: [1:Small (1-33%)] [N/A:N/A] Exposed Structures: [1:Fascia: No Fat: No Tendon: No] [N/A:N/A] Muscle: No Joint: No Bone: No Limited to Skin Breakdown Epithelialization: None N/A N/A Periwound Skin Texture: Edema: No N/A N/A Excoriation: No Induration: No Callus: No Crepitus:  No Fluctuance: No Friable: No Rash: No Scarring: No Periwound Skin Moist: Yes N/A N/A Moisture: Maceration: No Dry/Scaly: No Periwound Skin Color: Atrophie Blanche: No N/A N/A Cyanosis: No Ecchymosis: No Erythema: No Hemosiderin Staining: No Mottled: No Pallor: No Rubor: No Temperature: No Abnormality N/A N/A Tenderness on No N/A N/A Palpation: Wound Preparation: Ulcer Cleansing: N/A N/A Rinsed/Irrigated with Saline Topical Anesthetic Applied: Other: lidocaine 4% Treatment Notes Electronic Signature(s) Signed: 04/16/2015 10:22:26 AM By: Gretta Cool, RN, BSN, Kim RN, BSN Entered By: Gretta Cool, RN, BSN, Kim on 04/16/2015 08:54:00 Graefe, Real Cons ([id number]) -------------------------------------------------------------------------------- Multi-Disciplinary Care Plan Details Patient Name: Casas, Adryana A. Date of Service: 04/16/2015 8:45 AM Medical Record Number: [id number] Patient Account Number: [id number] Date of Birth/Sex: 23-Jul-1955 (59 y.o. Female) Treating RN: Cornell Barman Primary Care Physician: Frazier Richards Other Clinician: Referring Physician: Frazier Richards Treating Physician/Extender: Frann Rider in Treatment: 2 Active Inactive Malignancy/Atypical Etiology Nursing Diagnoses: Knowledge deficit related to  disease process and management of malignancy Goals: Patient/caregiver will verbalize understanding of disease process and disease management of malignancy Date Initiated: 03/31/2015 Goal Status: Active Interventions: Assess patient and family medical history for signs and symptoms of malignancy/atypical etiology upon admission Provide education on malignant ulcerations Notes: Orientation to the Wound Care Program Nursing Diagnoses: Knowledge deficit related to the wound healing center program Goals: Patient/caregiver will verbalize understanding of the [place] [facility] Date Initiated: 03/31/2015 Goal Status: Active Interventions: Provide education on orientation to the wound center Notes: Wound/Skin Impairment Nursing Diagnoses: Impaired tissue integrity Goals: Falls, Leara A. ([id number]) Ulcer/skin breakdown will have a volume reduction of 30% by week 4 Date Initiated: 03/31/2015 Goal Status: Active Ulcer/skin breakdown will have a volume reduction of 50% by week 8 Date Initiated: 03/31/2015 Goal Status: Active Ulcer/skin breakdown will have a volume reduction of 80% by week 12 Date Initiated: 03/31/2015 Goal Status: Active Ulcer/skin breakdown will heal within 14 weeks Date Initiated: 03/31/2015 Goal Status: Active Interventions: Assess ulceration(s) every visit Notes: Electronic Signature(s) Signed: 04/16/2015 10:22:26 AM By: Gretta Cool, RN, BSN, Kim RN, BSN Entered By: Gretta Cool, RN, BSN, Kim on 04/16/2015 08:53:50 Ketron, Real Cons ([id number]) -------------------------------------------------------------------------------- Pain Assessment Details Patient Name: Rihn, Janesia A. Date of Service: 04/16/2015 8:45 AM Medical Record Number: [id number] Patient Account Number: [id number] Date of Birth/Sex: [birth year] (59 y.o. Female) Treating RN: Cornell Barman Primary Care Physician: Frazier Richards Other Clinician: Referring Physician: Frazier Richards Treating  Physician/Extender: Frann Rider in Treatment: 2 Active Problems Location of Pain Severity and Description of Pain Patient Has Paino No Site Locations Pain Management and Medication Current Pain Management: Electronic Signature(s) Signed: 04/16/2015 10:22:26 AM By: Gretta Cool, RN, BSN, Kim RN, BSN Entered By: Gretta Cool, RN, BSN, Kim on 04/16/2015 08:48:42 Wirthlin, Real Cons ([id number]) -------------------------------------------------------------------------------- Patient/Caregiver Education Details Patient Name: [name] Date of Service: 04/16/2015 8:45 AM Medical Record Number: [id number] Patient Account Number: [id number] Date of Birth/Gender: Feb 06, 1956 (59 y.o. Female) Treating RN: Cornell Barman Primary Care Physician: Frazier Richards Other Clinician: Referring Physician: Frazier Richards Treating Physician/Extender: Frann Rider in Treatment: 2 Education Assessment Education Provided To: Patient Education Topics Provided Wound/Skin Impairment: Handouts: Caring for Your Ulcer, Other: wound care as prescribed Methods: Demonstration, Explain/Verbal Responses: State content correctly Electronic Signature(s) Signed: 04/16/2015 10:22:26 AM By: Gretta Cool, RN, BSN, Kim RN, BSN Entered By: Gretta Cool, RN, BSN, Kim on 04/16/2015 09:08:06 Pyper, Real Cons ([id number]) -------------------------------------------------------------------------------- Wound Assessment Details Patient Name: Montfort, Lizvette A. Date of Service: 04/16/2015 8:45 AM Medical Record Number: [id number] Patient Account Number: [id number] Date of Birth/Sex:  16-Jul-1955 (59 y.o. Female) Treating RN: Cornell Barman Primary Care Physician: Frazier Richards Other Clinician: Referring Physician: Frazier Richards Treating Physician/Extender: Frann Rider in Treatment: 2 Wound Status Wound Number: 1 Primary Malignant Wound Etiology: Wound Location: Right Breast Wound Open Wounding Event:  Gradually Appeared Status: Date Acquired: 06/02/2014 Comorbid Hypertension, Myocardial Infarction, Weeks Of Treatment: 2 History: Received Chemotherapy, Received Clustered Wound: No Radiation Photos Photo Uploaded By: Gretta Cool, RN, BSN, Kim on 04/16/2015 16:13:12 Wound Measurements Length: (cm) 8.5 Width: (cm) 7 Depth: (cm) 0.2 Area: (cm) 46.731 Volume: (cm) 9.346 % Reduction in Area: 9.4% % Reduction in Volume: 9.4% Epithelialization: None Wound Description Full Thickness Without Exposed Classification: Support Structures Wound Margin: Flat and Intact Medium Delong, Mychelle A. ([id number]) Foul Odor After Cleansing: No Exudate Amount: Exudate Type: Serous Exudate Color: amber Wound Bed Granulation Amount: Large (67-100%) Exposed Structure Granulation Quality: Red Fascia Exposed: No Necrotic Amount: Small (1-33%) Fat Layer Exposed: No Necrotic Quality: Adherent Slough Tendon Exposed: No Muscle Exposed: No Joint Exposed: No Bone Exposed: No Limited to Skin Breakdown Periwound Skin Texture Texture Color No Abnormalities Noted: No No Abnormalities Noted: No Callus: No Atrophie Blanche: No Crepitus: No Cyanosis: No Excoriation: No Ecchymosis: No Fluctuance: No Erythema: No Friable: No Hemosiderin Staining: No Induration: No Mottled: No Localized Edema: No Pallor: No Rash: No Rubor: No Scarring: No Temperature / Pain Moisture Temperature: No Abnormality No Abnormalities Noted: No Dry / Scaly: No Maceration: No Moist: Yes Wound Preparation Ulcer Cleansing: Rinsed/Irrigated with Saline Topical Anesthetic Applied: Other: lidocaine 4%, Treatment Notes Wound #1 (Right Breast) 1. Cleansed with: Clean wound with Normal Saline 2. Anesthetic Topical Lidocaine 4% cream to wound bed prior to debridement 4. Dressing Applied: Other dressing (specify in notes) Notes Short, Tinsley A. ([id number]) [employer] Occupational psychologist) Signed:  04/16/2015 10:22:26 AM By: Gretta Cool, RN, BSN, Kim RN, BSN Entered By: Gretta Cool, RN, BSN, Kim on 04/16/2015 08:53:42 Schoenberger, Real Cons ([id number]) -------------------------------------------------------------------------------- Vitals Details Patient Name: Fuente, Jolan A. Date of Service: 04/16/2015 8:45 AM Medical Record Number: [id number] Patient Account Number: [id number] Date of Birth/Sex: [birth year] (59 y.o. Female) Treating RN: Cornell Barman Primary Care Physician: Frazier Richards Other Clinician: Referring Physician: Frazier Richards Treating Physician/Extender: Frann Rider in Treatment: 2 Vital Signs Time Taken: 08:48 Temperature (F): 98.3 Height (in): 65 Pulse (bpm): 86 Weight (lbs): 340 Respiratory Rate (breaths/min): 18 Body Mass Index (BMI): 56.6 Blood Pressure (mmHg): 157/80 Reference Range: 80 - 120 mg / dl Electronic Signature(s) Signed: 04/16/2015 10:22:26 AM By: Gretta Cool, RN, BSN, Kim RN, BSN Entered By: Gretta Cool, RN, BSN, Kim on 04/16/2015 08:49:17

## 2015-04-20 ENCOUNTER — Inpatient Hospital Stay: Payer: BC Managed Care – PPO

## 2015-04-20 ENCOUNTER — Encounter: Payer: Self-pay | Admitting: *Deleted

## 2015-04-20 VITALS — BP 116/75 | HR 97 | Temp 99.4°F | Resp 20

## 2015-04-20 DIAGNOSIS — Z5111 Encounter for antineoplastic chemotherapy: Secondary | ICD-10-CM | POA: Diagnosis not present

## 2015-04-20 DIAGNOSIS — C50911 Malignant neoplasm of unspecified site of right female breast: Secondary | ICD-10-CM

## 2015-04-20 DIAGNOSIS — C7951 Secondary malignant neoplasm of bone: Principal | ICD-10-CM

## 2015-04-20 LAB — CBC WITH DIFFERENTIAL/PLATELET
Basophils Absolute: 0.1 10*3/uL (ref 0–0.1)
Basophils Relative: 2 %
EOS PCT: 4 %
Eosinophils Absolute: 0.2 10*3/uL (ref 0–0.7)
HEMATOCRIT: 32 % — AB (ref 35.0–47.0)
Hemoglobin: 10.4 g/dL — ABNORMAL LOW (ref 12.0–16.0)
LYMPHS ABS: 1.5 10*3/uL (ref 1.0–3.6)
Lymphocytes Relative: 33 %
MCH: 27.6 pg (ref 26.0–34.0)
MCHC: 32.5 g/dL (ref 32.0–36.0)
MCV: 85 fL (ref 80.0–100.0)
MONO ABS: 0.3 10*3/uL (ref 0.2–0.9)
Monocytes Relative: 6 %
Neutro Abs: 2.5 10*3/uL (ref 1.4–6.5)
Neutrophils Relative %: 55 %
PLATELETS: 258 10*3/uL (ref 150–440)
RBC: 3.77 MIL/uL — AB (ref 3.80–5.20)
RDW: 23.6 % — AB (ref 11.5–14.5)
WBC: 4.6 10*3/uL (ref 3.6–11.0)

## 2015-04-20 MED ORDER — INFLUENZA VAC SPLIT QUAD 0.5 ML IM SUSY
0.5000 mL | PREFILLED_SYRINGE | Freq: Once | INTRAMUSCULAR | Status: AC
  Administered 2015-04-20: 0.5 mL via INTRAMUSCULAR
  Filled 2015-04-20: qty 0.5

## 2015-04-20 MED ORDER — HEPARIN SOD (PORK) LOCK FLUSH 100 UNIT/ML IV SOLN
500.0000 [IU] | Freq: Once | INTRAVENOUS | Status: AC | PRN
  Administered 2015-04-20: 500 [IU]
  Filled 2015-04-20: qty 5

## 2015-04-20 MED ORDER — SODIUM CHLORIDE 0.9 % IV SOLN
Freq: Once | INTRAVENOUS | Status: AC
  Administered 2015-04-20: 15:00:00 via INTRAVENOUS
  Filled 2015-04-20: qty 4

## 2015-04-20 MED ORDER — FAMOTIDINE IN NACL 20-0.9 MG/50ML-% IV SOLN
20.0000 mg | Freq: Once | INTRAVENOUS | Status: AC
Start: 2015-04-20 — End: 2015-04-20
  Administered 2015-04-20: 20 mg via INTRAVENOUS
  Filled 2015-04-20: qty 50

## 2015-04-20 MED ORDER — DIPHENHYDRAMINE HCL 50 MG/ML IJ SOLN
50.0000 mg | Freq: Once | INTRAMUSCULAR | Status: AC
  Administered 2015-04-20: 50 mg via INTRAVENOUS
  Filled 2015-04-20: qty 1

## 2015-04-20 MED ORDER — SODIUM CHLORIDE 0.9 % IJ SOLN
10.0000 mL | INTRAMUSCULAR | Status: DC | PRN
  Administered 2015-04-20: 10 mL
  Filled 2015-04-20: qty 10

## 2015-04-20 MED ORDER — PACLITAXEL CHEMO INJECTION 300 MG/50ML
90.0000 mg/m2 | Freq: Once | INTRAVENOUS | Status: AC
  Administered 2015-04-20: 240 mg via INTRAVENOUS
  Filled 2015-04-20: qty 40

## 2015-04-20 MED ORDER — SODIUM CHLORIDE 0.9 % IV SOLN
Freq: Once | INTRAVENOUS | Status: AC
  Administered 2015-04-20: 14:00:00 via INTRAVENOUS
  Filled 2015-04-20: qty 1000

## 2015-04-20 NOTE — Progress Notes (Signed)
04/20/2015 @14 :10pm [name] returns to clinic this afternoon for consideration of cycle 3 day 15 Taxol infusion. Patient reports she is doing well. Reviewed phone PRO-CTCAE form with her, and patient reports she has no new symptoms and the only side effect she can report is ongoing fatigue. States she does not have to go to work or anything, so she is not very active at all and has very little energy. Stats her lymphedema in her right arm has continued to improve and states she has more strength in her hands and better ROM in her fingers and right arm in general. Denies experiencing any numbness or tingling at all. Patient is requesting the Flu vaccine if Dr. Oliva Bustard approves. Her VS are stable this afternoon and she has no viral or other symptoms. CBC is also within acceptable parameters for treatment. Dr. Oliva Bustard informed of [name]'s request and he states she should have the vaccine and he will put in an order for it. Patient also request that her Bone scan next week be moved to the [facility] center. T/C made to [facility] in radiology and she verifies that the outpatient clinic will resume performing bone scans next week. jody changed the patient's appointment to Thursday - 04/30/15 at 10:45 - and patient was instructed to go for her bone scan injection as soon as she completes her CT scan. [name] states that will be so much more convenient for her and she will only need to make one trip to town that way. Dr. Oliva Bustard states OK to proceed with Taxol infusion as scheduled. AEs with grade and attribution as follows:     Alopecia - grade 2; definitely attributed to Taxol Lymphedema - grade 2; unrelated (baseline) Fatigue - grade 1; definitely related

## 2015-04-28 ENCOUNTER — Encounter: Payer: Self-pay | Admitting: *Deleted

## 2015-04-28 DIAGNOSIS — Z780 Asymptomatic menopausal state: Secondary | ICD-10-CM | POA: Insufficient documentation

## 2015-04-29 ENCOUNTER — Ambulatory Visit: Admission: RE | Admit: 2015-04-29 | Payer: BC Managed Care – PPO | Source: Ambulatory Visit

## 2015-04-30 ENCOUNTER — Encounter
Admission: RE | Admit: 2015-04-30 | Discharge: 2015-04-30 | Disposition: A | Discharge status: Home / Self Care | Payer: BC Managed Care – PPO | Source: Ambulatory Visit | Attending: Oncology | Admitting: Oncology

## 2015-04-30 ENCOUNTER — Encounter: Admission: RE | Admit: 2015-04-30 | Payer: BC Managed Care – PPO | Source: Ambulatory Visit

## 2015-04-30 ENCOUNTER — Ambulatory Visit
Admission: RE | Admit: 2015-04-30 | Discharge: 2015-04-30 | Disposition: A | Discharge status: Home / Self Care | Payer: BC Managed Care – PPO | Source: Ambulatory Visit

## 2015-04-30 ENCOUNTER — Ambulatory Visit: Admission: RE | Admit: 2015-04-30 | Payer: BC Managed Care – PPO | Source: Ambulatory Visit

## 2015-04-30 ENCOUNTER — Telehealth: Payer: Self-pay | Admitting: *Deleted

## 2015-04-30 ENCOUNTER — Ambulatory Visit
Admission: RE | Admit: 2015-04-30 | Discharge: 2015-04-30 | Disposition: A | Discharge status: Home / Self Care | Payer: BC Managed Care – PPO | Source: Ambulatory Visit | Attending: Oncology | Admitting: Oncology

## 2015-04-30 ENCOUNTER — Other Ambulatory Visit: Payer: Self-pay | Admitting: Oncology

## 2015-04-30 ENCOUNTER — Ambulatory Visit: Payer: BC Managed Care – PPO

## 2015-04-30 DIAGNOSIS — I2510 Atherosclerotic heart disease of native coronary artery without angina pectoris: Secondary | ICD-10-CM | POA: Diagnosis not present

## 2015-04-30 DIAGNOSIS — C50911 Malignant neoplasm of unspecified site of right female breast: Secondary | ICD-10-CM | POA: Insufficient documentation

## 2015-04-30 DIAGNOSIS — T82868A Thrombosis of vascular prosthetic devices, implants and grafts, initial encounter: Secondary | ICD-10-CM | POA: Insufficient documentation

## 2015-04-30 DIAGNOSIS — C7989 Secondary malignant neoplasm of other specified sites: Secondary | ICD-10-CM | POA: Diagnosis not present

## 2015-04-30 DIAGNOSIS — Z79899 Other long term (current) drug therapy: Secondary | ICD-10-CM | POA: Insufficient documentation

## 2015-04-30 DIAGNOSIS — K5730 Diverticulosis of large intestine without perforation or abscess without bleeding: Secondary | ICD-10-CM | POA: Insufficient documentation

## 2015-04-30 DIAGNOSIS — Y839 Surgical procedure, unspecified as the cause of abnormal reaction of the patient, or of later complication, without mention of misadventure at the time of the procedure: Secondary | ICD-10-CM | POA: Diagnosis not present

## 2015-04-30 DIAGNOSIS — C7951 Secondary malignant neoplasm of bone: Principal | ICD-10-CM

## 2015-04-30 DIAGNOSIS — C7972 Secondary malignant neoplasm of left adrenal gland: Secondary | ICD-10-CM | POA: Insufficient documentation

## 2015-04-30 DIAGNOSIS — K869 Disease of pancreas, unspecified: Secondary | ICD-10-CM | POA: Diagnosis not present

## 2015-04-30 DIAGNOSIS — R591 Generalized enlarged lymph nodes: Secondary | ICD-10-CM | POA: Insufficient documentation

## 2015-04-30 MED ORDER — IOHEXOL 350 MG/ML SOLN
100.0000 mL | Freq: Once | INTRAVENOUS | Status: AC | PRN
  Administered 2015-04-30: 100 mL via INTRAVENOUS

## 2015-04-30 MED ORDER — TECHNETIUM TC 99M MEDRONATE IV KIT
25.0000 | PACK | Freq: Once | INTRAVENOUS | Status: AC | PRN
  Administered 2015-04-30: 23.55 via INTRAVENOUS

## 2015-04-30 MED ORDER — RIVAROXABAN (XARELTO) VTE STARTER PACK (15 & 20 MG): ORAL_TABLET | ORAL | Status: DC

## 2015-04-30 NOTE — Telephone Encounter (Signed)
Called to inform pt that she just had a scan and there was clot in the vessel where port was placed and she needs to start on blood thinner.  We have samples xarelto in here and she can come get them. Told her to take them 1 in am and 1 in pm.  If she has any bleedinng she should  Call md office.  She is agreeable to the plan and she will be here mon to see md and they discuss further.

## 2015-05-01 ENCOUNTER — Other Ambulatory Visit: Payer: Self-pay | Admitting: Family Medicine

## 2015-05-01 ENCOUNTER — Telehealth: Payer: Self-pay | Admitting: *Deleted

## 2015-05-01 MED ORDER — TECHNETIUM TC 99M MEDRONATE IV KIT
25.0000 | PACK | Freq: Once | INTRAVENOUS | Status: DC | PRN
Start: 2015-05-01 — End: 2015-05-02

## 2015-05-01 NOTE — Telephone Encounter (Signed)
05/01/15 @9 :10am Received t/c from [name] this morning questioning whether she needed to take the new medication Xarelto along with her Plavix and Aspirin. S/W Amy Nim, NP, who is covering for [name], and she stated to have patient stop the Plavix and Aspirin, and continue to take the Xarelto as instructed. Informed patient of these instructions, and she reports she has already taken her Plavix this morning, but has not yet taken the Aspirin. Inst to stop the Plavix as of today, and continue the Xarelto as instructed by [name] and [name] yesterday. She has an appointment with [name] on Monday - 05/04/15 and will discuss it further with him on that date. Patient able to correctly repeat back instructions and states she started taking the Xarelto last night as instructed.

## 2015-05-04 ENCOUNTER — Encounter: Payer: Self-pay | Admitting: Oncology

## 2015-05-04 ENCOUNTER — Inpatient Hospital Stay: Payer: BC Managed Care – PPO | Attending: Oncology | Admitting: Oncology

## 2015-05-04 ENCOUNTER — Encounter: Payer: Self-pay | Admitting: *Deleted

## 2015-05-04 ENCOUNTER — Inpatient Hospital Stay: Payer: BC Managed Care – PPO

## 2015-05-04 VITALS — BP 143/84 | HR 108 | Temp 98.6°F | Wt 340.1 lb

## 2015-05-04 DIAGNOSIS — C7989 Secondary malignant neoplasm of other specified sites: Secondary | ICD-10-CM | POA: Insufficient documentation

## 2015-05-04 DIAGNOSIS — Z170 Estrogen receptor positive status [ER+]: Secondary | ICD-10-CM | POA: Insufficient documentation

## 2015-05-04 DIAGNOSIS — Z955 Presence of coronary angioplasty implant and graft: Secondary | ICD-10-CM | POA: Insufficient documentation

## 2015-05-04 DIAGNOSIS — Y828 Other medical devices associated with adverse incidents: Secondary | ICD-10-CM | POA: Insufficient documentation

## 2015-05-04 DIAGNOSIS — Z7982 Long term (current) use of aspirin: Secondary | ICD-10-CM | POA: Insufficient documentation

## 2015-05-04 DIAGNOSIS — C7970 Secondary malignant neoplasm of unspecified adrenal gland: Secondary | ICD-10-CM

## 2015-05-04 DIAGNOSIS — Z87891 Personal history of nicotine dependence: Secondary | ICD-10-CM | POA: Insufficient documentation

## 2015-05-04 DIAGNOSIS — C7951 Secondary malignant neoplasm of bone: Secondary | ICD-10-CM | POA: Insufficient documentation

## 2015-05-04 DIAGNOSIS — Z780 Asymptomatic menopausal state: Secondary | ICD-10-CM | POA: Diagnosis not present

## 2015-05-04 DIAGNOSIS — T85868A Thrombosis due to other internal prosthetic devices, implants and grafts, initial encounter: Secondary | ICD-10-CM | POA: Diagnosis not present

## 2015-05-04 DIAGNOSIS — C50911 Malignant neoplasm of unspecified site of right female breast: Secondary | ICD-10-CM

## 2015-05-04 DIAGNOSIS — Z79899 Other long term (current) drug therapy: Secondary | ICD-10-CM | POA: Diagnosis not present

## 2015-05-04 DIAGNOSIS — Z006 Encounter for examination for normal comparison and control in clinical research program: Secondary | ICD-10-CM | POA: Insufficient documentation

## 2015-05-04 DIAGNOSIS — Z7901 Long term (current) use of anticoagulants: Secondary | ICD-10-CM | POA: Insufficient documentation

## 2015-05-04 DIAGNOSIS — I890 Lymphedema, not elsewhere classified: Secondary | ICD-10-CM | POA: Diagnosis not present

## 2015-05-04 DIAGNOSIS — Z5111 Encounter for antineoplastic chemotherapy: Secondary | ICD-10-CM | POA: Diagnosis not present

## 2015-05-04 DIAGNOSIS — I252 Old myocardial infarction: Secondary | ICD-10-CM | POA: Insufficient documentation

## 2015-05-04 DIAGNOSIS — I10 Essential (primary) hypertension: Secondary | ICD-10-CM | POA: Insufficient documentation

## 2015-05-04 LAB — COMPREHENSIVE METABOLIC PANEL
ALT: 13 U/L — ABNORMAL LOW (ref 14–54)
ANION GAP: 10 (ref 5–15)
AST: 22 U/L (ref 15–41)
Albumin: 3.3 g/dL — ABNORMAL LOW (ref 3.5–5.0)
Alkaline Phosphatase: 61 U/L (ref 38–126)
BILIRUBIN TOTAL: 0.4 mg/dL (ref 0.3–1.2)
BUN: 14 mg/dL (ref 6–20)
CHLORIDE: 107 mmol/L (ref 101–111)
CO2: 22 mmol/L (ref 22–32)
Calcium: 9.2 mg/dL (ref 8.9–10.3)
Creatinine, Ser: 0.73 mg/dL (ref 0.44–1.00)
Glucose, Bld: 173 mg/dL — ABNORMAL HIGH (ref 65–99)
POTASSIUM: 3.9 mmol/L (ref 3.5–5.1)
Sodium: 139 mmol/L (ref 135–145)
TOTAL PROTEIN: 7.3 g/dL (ref 6.5–8.1)

## 2015-05-04 LAB — CBC WITH DIFFERENTIAL/PLATELET
BASOS ABS: 0.1 10*3/uL (ref 0–0.1)
Basophils Relative: 1 %
EOS PCT: 1 %
Eosinophils Absolute: 0.1 10*3/uL (ref 0–0.7)
HCT: 34.1 % — ABNORMAL LOW (ref 35.0–47.0)
HEMOGLOBIN: 11.1 g/dL — AB (ref 12.0–16.0)
LYMPHS ABS: 1.4 10*3/uL (ref 1.0–3.6)
LYMPHS PCT: 16 %
MCH: 27.6 pg (ref 26.0–34.0)
MCHC: 32.4 g/dL (ref 32.0–36.0)
MCV: 85.4 fL (ref 80.0–100.0)
Monocytes Absolute: 0.8 10*3/uL (ref 0.2–0.9)
Monocytes Relative: 9 %
Neutro Abs: 6.6 10*3/uL — ABNORMAL HIGH (ref 1.4–6.5)
Neutrophils Relative %: 73 %
PLATELETS: 307 10*3/uL (ref 150–440)
RBC: 4 MIL/uL (ref 3.80–5.20)
RDW: 24 % — ABNORMAL HIGH (ref 11.5–14.5)
WBC: 9.1 10*3/uL (ref 3.6–11.0)

## 2015-05-04 MED ORDER — SODIUM CHLORIDE 0.9 % IV SOLN
Freq: Once | INTRAVENOUS | Status: AC
  Administered 2015-05-04: 10:00:00 via INTRAVENOUS
  Filled 2015-05-04: qty 1000

## 2015-05-04 MED ORDER — DIPHENHYDRAMINE HCL 50 MG/ML IJ SOLN
50.0000 mg | Freq: Once | INTRAMUSCULAR | Status: AC
  Administered 2015-05-04: 50 mg via INTRAVENOUS
  Filled 2015-05-04: qty 1

## 2015-05-04 MED ORDER — SODIUM CHLORIDE 0.9 % IV SOLN
Freq: Once | INTRAVENOUS | Status: AC
  Administered 2015-05-04: 11:00:00 via INTRAVENOUS
  Filled 2015-05-04: qty 4

## 2015-05-04 MED ORDER — HEPARIN SOD (PORK) LOCK FLUSH 100 UNIT/ML IV SOLN
500.0000 [IU] | Freq: Once | INTRAVENOUS | Status: AC | PRN
  Administered 2015-05-04: 500 [IU]
  Filled 2015-05-04: qty 5

## 2015-05-04 MED ORDER — SODIUM CHLORIDE 0.9 % IJ SOLN
10.0000 mL | Freq: Once | INTRAMUSCULAR | Status: AC
  Administered 2015-05-04: 10 mL via INTRAVENOUS
  Filled 2015-05-04: qty 10

## 2015-05-04 MED ORDER — FAMOTIDINE IN NACL 20-0.9 MG/50ML-% IV SOLN
20.0000 mg | Freq: Once | INTRAVENOUS | Status: AC
  Administered 2015-05-04: 20 mg via INTRAVENOUS
  Filled 2015-05-04: qty 50

## 2015-05-04 MED ORDER — PACLITAXEL CHEMO INJECTION 300 MG/50ML
90.0000 mg/m2 | Freq: Once | INTRAVENOUS | Status: AC
  Administered 2015-05-04: 240 mg via INTRAVENOUS
  Filled 2015-05-04: qty 40

## 2015-05-04 NOTE — Progress Notes (Signed)
05/04/15 @09 :45am [name] returns to clinic this morning for consideration of Cycle 4, Day 1 Taxol infusion per [facility]U [id number] I protocol. Patient continues to report she is doing well. Reviewed phone PRO report with her, and she states she is experiencing continued fatigue, which she describes as having "no energy" and "not motivated to do anything." She denies experiencing any pain or having problems managing her ADLs. 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 Dr. Oliva Bustard in to examine patient and does provide positive results from her recent CT and bone scans. He has also assessed and photographed the cutaneous lesion on her right upper outer breast. The lesion now measures 5.3 x 9.5cm and has gotten shallow since last assessment. She is being seen at the wound clinic for management as well. Ms. Eimer completed both of her 12 week patient questionnaire booklets while in clinic this morning. Central labs were collected per protocol prior to initiating Taxol. VS are stable per patient norm. CBC and Chemistries are all within acceptable parameters for patient to receive her treatment. Will proceed with Taxol infusion as scheduled. Current AEs with grade and attributions are as follows:       Alopecia - grade 2; definitely attributed to Taxol Lymphedema - grade 2; unrelated (baseline) Fatigue - grade 1; definitely related

## 2015-05-05 ENCOUNTER — Telehealth: Payer: Self-pay | Admitting: *Deleted

## 2015-05-05 ENCOUNTER — Encounter: Payer: Self-pay | Admitting: Oncology

## 2015-05-05 ENCOUNTER — Other Ambulatory Visit: Payer: Self-pay | Admitting: *Deleted

## 2015-05-05 DIAGNOSIS — I8290 Acute embolism and thrombosis of unspecified vein: Secondary | ICD-10-CM

## 2015-05-05 DIAGNOSIS — C7951 Secondary malignant neoplasm of bone: Principal | ICD-10-CM

## 2015-05-05 DIAGNOSIS — C50911 Malignant neoplasm of unspecified site of right female breast: Secondary | ICD-10-CM

## 2015-05-05 MED ORDER — RIVAROXABAN 20 MG PO TABS: 20.0000 mg | ORAL_TABLET | Freq: Every day | ORAL | Status: DC

## 2015-05-05 MED ORDER — RIVAROXABAN 15 MG PO TABS: 15.0000 mg | ORAL_TABLET | Freq: Two times a day (BID) | ORAL | Status: DC

## 2015-05-05 NOTE — Telephone Encounter (Signed)
05/05/15 @11 :18 T/C to patient to clarify time of Monday - 05/11/15 appointment. Patient instructed to come in at 1:00 for labs, and then has her infusion at 1:30. [name] agrees with this and correctly repeats back appointment time. KSS  05/05/15 @11 :20 Received t/c back from patient questioning whether her Xarelto had been called in. Informed that I will talke to Dr. Metro Kung nurse Hildred Alamin about this. Made call to [facility] and she states she is sending the prescription now. KSS

## 2015-05-05 NOTE — Progress Notes (Signed)
[name]  [phone]  [phone]     [name] DOB: 01-09-1956  MR#: [id number]  UUV#:[id number]  Patient Care Team: Kirk Ruths, MD as PCP - General (Internal Medicine)  CHIEF COMPLAINT:  Chief Complaint  Patient presents with  . OTHER    This 59 y.o. female patient presents to the clinic for follow-up metastatic breast cancer.   Chief Complaint/Problem List: 1. Breast cancer see also 07/08/13 note, initially a T4 N2 M1 ER positive HER-2 negative stage IV disease 2. Anemia iron deficiency attributed to prior bleeding from wound C earlier documents 3. Right arm lymphedema prior cellulitis 4. Right breast nonhealing but stable wound 5. Past history of hypertension hyperlipidemia coronary disease stenting      ONCOLOGY HISTORY: Oncology History   1. 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 Previously noted long-term bleeding from the wound of right breast associated with cancer. 2. Patient has extensive right arm lymphedema. Patient is limited in range of motion due to lymphedema and pain in right arm. 3. Patient was started on Xgeva and letrozole in January 2015, [name] was started in April 2015. 4. CEA at lowest 7.4, failure of Ibrance and letrozole with steady rise in CEA. Most recent CEA in May 2016 reported as 223.    6.TX XGEVA FIRST [facility] 06/2013 LETRAZOLE 07/12/13  IBRANCE 10/10/13. ibrance 12/27/13..cea lowest at 7.4, but failure of ibrance and letrozole as steady rise since      Stage IV breast cancer with metastases to bone and adrenal gland.  INTERVAL HISTORY:  59 year old lady came today further follow-up regarding stage IV carcinoma of breast.  Since last evaluation Patient had received a complete course of chemotherapy this was the third cycle of treatment Patient is getting treatment under  protocol. No nausea no vomiting no diarrhea No tingling or numbness.  Right upper extremity lymphedema is improving.  Since last evaluation patient had a CT scan of chest and abdomen done for complete restaging as required by protocol. Was also found to have thrombus around the port site Blood flow was fairly good.  Patient is here to discuss the results further evaluation and treatment consideration. REVIEW OF SYSTEMS:   ROS GENERAL:  Feels good.  Active.  No fevers, sweats or weight loss. PERFORMANCE STATUS (ECOG):  0 HEENT:  No visual changes, runny nose, sore throat, mouth sores or tenderness. Lungs: No shortness of breath or cough.  No hemoptysis. Cardiac:  No chest pain, palpitations, orthopnea, or PND. GI:  No nausea, vomiting, diarrhea, constipation, melena or hematochezia. GU:  No urgency, frequency, dysuria, or hematuria. Musculoskeletal:  No back pain.  No joint pain.  No muscle tenderness. Extremities:  Right upper extremity swelling Skin:  No rashes or skin changes. Neuro:  No headache, numbness or weakness, balance or coordination issues. Endocrine:  No diabetes, thyroid issues, hot flashes or night sweats. Psych:  No mood changes, depression or anxiety. Pain:  No focal pain. Review of systems:  All other systems reviewed and found to be negative. Breast wound is slowly healing.  Patient is attending wound care center once a month   PAST MEDICAL HISTORY: Past Medical History  Diagnosis Date  . Hypertension   . Myocardial infarction ([place])   . Anginal pain ([place])   . Sleep apnea   . Anemia   . Post-menopausal 2006  . Breast cancer (  [place])   . Breast cancer metastasized to bone ([place]) 11/21/2014    PAST SURGICAL HISTORY: Past Surgical History  Procedure Laterality Date  . Coronary angioplasty with stent placement N/A   . Hernia repair      umbilical hernia repair  . Portacath placement Right 02/03/2015    Procedure: INSERTION PORT-A-CATH;  Surgeon: [name], MD;  Location: [facility] ORS;  Service: General;  Laterality: Right;    FAMILY HISTORY There is no significant family history of breast cancer, ovarian cancer, colon cancer    ADVANCED DIRECTIVES:  Patient does not have any living will or healthcare power of attorney.  Information was given .  Available resources had been discussed.  We will follow-up on subsequent appointments regarding this issue  HEALTH MAINTENANCE: Social History  Substance Use Topics  . Smoking status: Former Smoker -- 1.00 packs/day    Types: Cigarettes    Quit date: 10/25/2004  . Smokeless tobacco: Never Used     Comment: stopped smoking greater than 7 years ago  . Alcohol Use: No       Allergies  Allergen Reactions  . No Known Allergies     Current Outpatient Prescriptions  Medication Sig Dispense Refill  . acetaminophen (TYLENOL) 500 MG tablet Take 1,000 mg by mouth every 6 (six) hours as needed.    . [facility] CALCIUM 600+D 600-800 MG-UNIT TABS Take 1 tablet by mouth daily. At noon  3  . enalapril (VASOTEC) 5 MG tablet   2  . fexofenadine (ALLEGRA) 180 MG tablet Take 180 mg by mouth at bedtime.    . furosemide (LASIX) 20 MG tablet Take 20 mg by mouth daily.  1  . lidocaine-prilocaine (EMLA) cream Apply 1 application topically as needed. 30 g 3  . lisinopril (PRINIVIL,ZESTRIL) 20 MG tablet Take 20 mg by mouth daily.    . metoprolol succinate (TOPROL-XL) 50 MG 24 hr tablet Take 50 mg by mouth 2 (two) times daily. Take with or immediately following a meal.    . omega-3 acid ethyl esters (LOVAZA) 1 G capsule Take 1 g by mouth at bedtime.    . ondansetron (ZOFRAN) 8 MG tablet Take 1 tablet (8 mg total) by mouth 2 (two) times daily. Start the day after chemo for 2 days. Then take as needed for nausea or vomiting. 30 tablet 1  . phosphorus (PHOSPHA 250 NEUTRAL) 155-852-130 MG tablet Take 1 tablet (250 mg total) by mouth 2 (two) times daily. 60 tablet 6  . potassium chloride (K-DUR) 10 MEQ tablet Take 10 mEq by  mouth daily.   1  . Rivaroxaban (XARELTO STARTER PACK) 15 & 20 MG TBPK Take as directed on package: Start with one 42m tablet by mouth twice a day with food. On Day 22, switch to one 211mtablet once a day with food. 51 each 0  . simvastatin (ZOCOR) 80 MG tablet Take 80 mg by mouth at bedtime.    . Marland Kitchenspirin EC 81 MG tablet Take 81 mg by mouth daily.    . clopidogrel (PLAVIX) 75 MG tablet Take 75 mg by mouth daily.     No current facility-administered medications for this visit.    OBJECTIVE: BP 143/84 mmHg  Pulse 108  Temp(Src) 98.6 F (37 C) (Tympanic)  Wt 340 lb 2 oz (154.28 kg)   Body mass index is 58.35 kg/(m^2).    ECOG FS:0 - Asymptomatic  General: Well-developed, well-nourished, no acute distress. Eyes: Pink conjunctiva, anicteric sclera. HEENT: Normocephalic, moist mucous  membranes, clear oropharnyx. Lungs: Clear to auscultation bilaterally. Heart: Regular rate and rhythm. No rubs, murmurs, or gallops. Abdomen: Soft, nontender, nondistended. No organomegaly noted, normoactive bowel sounds. Musculoskeletal: No edema, cyanosis, or clubbing. Neuro: Alert, answering all questions appropriately. Cranial nerves grossly intact. Skin: No rashes or petechiae noted. Psych: Normal affect. Lymphatics: No cervical, calvicular, axillary or inguinal LAD.  Right upper extremity lymphedema is gradually getting better.  Is grade 3 lymphedema. Right breast: In duration persist however open wound is gradually healing.  There are multiple lady is a bleeding.   LAB RESULTS: Component     Latest Ref Rng 01/30/2014 02/26/2014 03/20/2014 04/24/2014 05/13/2014  CEA     0.0 - 4.7 ng/mL 15.4 (H) 31.4 (H) 33.5 (H) 66.4 (H) 73.7 (H)   Component     Latest Ref Rng 06/05/2014 08/01/2014 10/02/2014 10/30/2014 12/25/2014  CEA     0.0 - 4.7 ng/mL 75.8 (H) 91.8 (H) 173.1 (H) 223.1 (H) 367.2 (H)            Infusion on 05/04/2015  Component Date Value Ref Range Status  . Sodium 05/04/2015 139  135 - 145 mmol/L  Final  . Potassium 05/04/2015 3.9  3.5 - 5.1 mmol/L Final  . Chloride 05/04/2015 107  101 - 111 mmol/L Final  . CO2 05/04/2015 22  22 - 32 mmol/L Final  . Glucose, Bld 05/04/2015 173* 65 - 99 mg/dL Final  . BUN 05/04/2015 14  6 - 20 mg/dL Final  . Creatinine, Ser 05/04/2015 0.73  0.44 - 1.00 mg/dL Final  . Calcium 05/04/2015 9.2  8.9 - 10.3 mg/dL Final  . Total Protein 05/04/2015 7.3  6.5 - 8.1 g/dL Final  . Albumin 05/04/2015 3.3* 3.5 - 5.0 g/dL Final  . AST 05/04/2015 22  15 - 41 U/L Final  . ALT 05/04/2015 13* 14 - 54 U/L Final  . Alkaline Phosphatase 05/04/2015 61  38 - 126 U/L Final  . Total Bilirubin 05/04/2015 0.4  0.3 - 1.2 mg/dL Final  . GFR calc non Af Amer 05/04/2015 >60  >60 mL/min Final  . GFR calc Af Amer 05/04/2015 >60  >60 mL/min Final   Comment: (NOTE) The eGFR has been calculated using the CKD EPI equation. This calculation has not been validated in all clinical situations. eGFR's persistently <60 mL/min signify possible Chronic Kidney Disease.   . Anion gap 05/04/2015 10  5 - 15 Final  . WBC 05/04/2015 9.1  3.6 - 11.0 K/uL Final  . RBC 05/04/2015 4.00  3.80 - 5.20 MIL/uL Final  . Hemoglobin 05/04/2015 11.1* 12.0 - 16.0 g/dL Final  . HCT 05/04/2015 34.1* 35.0 - 47.0 % Final  . MCV 05/04/2015 85.4  80.0 - 100.0 fL Final  . MCH 05/04/2015 27.6  26.0 - 34.0 pg Final  . MCHC 05/04/2015 32.4  32.0 - 36.0 g/dL Final  . RDW 05/04/2015 24.0* 11.5 - 14.5 % Final  . Platelets 05/04/2015 307  150 - 440 K/uL Final  . Neutrophils Relative % 05/04/2015 73   Final  . Neutro Abs 05/04/2015 6.6* 1.4 - 6.5 K/uL Final  . Lymphocytes Relative 05/04/2015 16   Final  . Lymphs Abs 05/04/2015 1.4  1.0 - 3.6 K/uL Final  . Monocytes Relative 05/04/2015 9   Final  . Monocytes Absolute 05/04/2015 0.8  0.2 - 0.9 K/uL Final  . Eosinophils Relative 05/04/2015 1   Final  . Eosinophils Absolute 05/04/2015 0.1  0 - 0.7 K/uL Final  . Basophils Relative 05/04/2015 1  Final  . Basophils  Absolute 05/04/2015 0.1  0 - 0.1 K/uL Final  CLINICAL DATA: 59 year old female with history of right-sided  breast cancer diagnosed in December 2014, currently undergoing  chemotherapy.  EXAM:  CT CHEST, ABDOMEN, AND PELVIS WITH CONTRAST  TECHNIQUE:  Multidetector CT imaging of the chest, abdomen and pelvis was  performed following the standard protocol during bolus  administration of intravenous contrast.  CONTRAST: 176m OMNIPAQUE IOHEXOL 350 MG/ML SOLN  COMPARISON: Multiple priors, most recently CT of the chest, abdomen  and pelvis 02/05/2015.  FINDINGS:  RECIST 1.1  Target Lesions:  1. Primary right breast neoplasm significantly decreased in size,  currently measuring 4.8 cm (previously 8.3 cm).  2. Right axillary nodal conglomerate 4.7 cm short axis (previously  7.4 cm).  3. Right shoulder soft tissue metastasis (posterior to right  infraspinatus muscle) decreased to 2.6 cm (previously 4.3 cm).  4. Left adrenal metastasis 2.8 cm (previously 5.2 cm).  5. Left inguinal lymph node 9 mm (previously 2.9 cm).  Non-target Lesions: .  IMPRESSION: 1. Today's study demonstrates a positive response to therapy, with marked regression of the right breast mass, decreased size of numerous soft tissue metastases in and around the right shoulder region, decreased size of left adrenal metastasis, and marked decrease of lymphadenopathy noted throughout the chest, abdomen and pelvis, as detailed above. 2. No new metastatic lesions are identified in the chest, abdomen or pelvis. 3. 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 4. Additional incidental findings, as above. These results will be called to the ordering clinician or representative by the Radiologist Assistant, and communication documented in the PACS or zVision Dashboard    ASSESSMENT:  S tage IV breast cancer with bone metastases, adrenal metastasis. CT scan has been  reviewed and reviewed with the research nurses and with the patient.  By address his criteria patient has a significant response which may qualify for partial response. 2.  New onset of thrombus associated with catheter Blood flow through catheter is fairly good. Patient was started on now xeralto 15 mg twice a day then patient would continue that for total 21 days followed by 20 mg daily  Patient was on Plavix and aspirin so Plavix was discontinued but aspirin would be continued. Significant response and will continue chemotherapy Discussion with research nurses and review of protocol data Continue 3 more weekly cycle of treatment and reevaluate patient Patient was instructed if starts bleeding to get in touch with [place]  Total duration of visit was 45 minutes.  50% or more time was spent in counseling patient and family regarding prognosis and options of treatment and available resources. Reviewing scan Assessment    Of  tumor And management of new onset of thrombus JForest Gleason MD   05/05/2015 11:07 AM

## 2015-05-11 ENCOUNTER — Ambulatory Visit: Payer: BC Managed Care – PPO

## 2015-05-11 ENCOUNTER — Inpatient Hospital Stay: Payer: BC Managed Care – PPO

## 2015-05-11 ENCOUNTER — Encounter: Payer: Self-pay | Admitting: *Deleted

## 2015-05-11 ENCOUNTER — Other Ambulatory Visit: Payer: BC Managed Care – PPO

## 2015-05-11 VITALS — BP 129/82 | HR 93 | Temp 99.3°F | Resp 20

## 2015-05-11 DIAGNOSIS — C50911 Malignant neoplasm of unspecified site of right female breast: Secondary | ICD-10-CM

## 2015-05-11 DIAGNOSIS — C7951 Secondary malignant neoplasm of bone: Principal | ICD-10-CM

## 2015-05-11 DIAGNOSIS — Z5111 Encounter for antineoplastic chemotherapy: Secondary | ICD-10-CM | POA: Diagnosis not present

## 2015-05-11 LAB — CBC WITH DIFFERENTIAL/PLATELET
BASOS ABS: 0.1 10*3/uL (ref 0–0.1)
Basophils Relative: 1 %
EOS ABS: 0.3 10*3/uL (ref 0–0.7)
Eosinophils Relative: 3 %
HCT: 32.9 % — ABNORMAL LOW (ref 35.0–47.0)
HEMOGLOBIN: 10.6 g/dL — AB (ref 12.0–16.0)
LYMPHS ABS: 1.9 10*3/uL (ref 1.0–3.6)
LYMPHS PCT: 19 %
MCH: 27.4 pg (ref 26.0–34.0)
MCHC: 32.1 g/dL (ref 32.0–36.0)
MCV: 85.4 fL (ref 80.0–100.0)
Monocytes Absolute: 0.5 10*3/uL (ref 0.2–0.9)
Monocytes Relative: 5 %
NEUTROS PCT: 72 %
Neutro Abs: 7.4 10*3/uL — ABNORMAL HIGH (ref 1.4–6.5)
Platelets: 375 10*3/uL (ref 150–440)
RBC: 3.86 MIL/uL (ref 3.80–5.20)
RDW: 24 % — ABNORMAL HIGH (ref 11.5–14.5)
WBC: 10.2 10*3/uL (ref 3.6–11.0)

## 2015-05-11 LAB — COMPREHENSIVE METABOLIC PANEL
ALT: 14 U/L (ref 14–54)
AST: 17 U/L (ref 15–41)
Albumin: 3.4 g/dL — ABNORMAL LOW (ref 3.5–5.0)
Alkaline Phosphatase: 61 U/L (ref 38–126)
Anion gap: 8 (ref 5–15)
BUN: 18 mg/dL (ref 6–20)
CHLORIDE: 101 mmol/L (ref 101–111)
CO2: 24 mmol/L (ref 22–32)
CREATININE: 0.7 mg/dL (ref 0.44–1.00)
Calcium: 8.8 mg/dL — ABNORMAL LOW (ref 8.9–10.3)
Glucose, Bld: 139 mg/dL — ABNORMAL HIGH (ref 65–99)
POTASSIUM: 3.9 mmol/L (ref 3.5–5.1)
Sodium: 133 mmol/L — ABNORMAL LOW (ref 135–145)
TOTAL PROTEIN: 7.1 g/dL (ref 6.5–8.1)
Total Bilirubin: 0.5 mg/dL (ref 0.3–1.2)

## 2015-05-11 MED ORDER — DIPHENHYDRAMINE HCL 50 MG/ML IJ SOLN
50.0000 mg | Freq: Once | INTRAMUSCULAR | Status: AC
  Administered 2015-05-11: 50 mg via INTRAVENOUS
  Filled 2015-05-11: qty 1

## 2015-05-11 MED ORDER — HEPARIN SOD (PORK) LOCK FLUSH 100 UNIT/ML IV SOLN
500.0000 [IU] | Freq: Once | INTRAVENOUS | Status: AC | PRN
  Administered 2015-05-11: 500 [IU]
  Filled 2015-05-11: qty 5

## 2015-05-11 MED ORDER — SODIUM CHLORIDE 0.9 % IV SOLN
Freq: Once | INTRAVENOUS | Status: AC
  Administered 2015-05-11: 14:00:00 via INTRAVENOUS
  Filled 2015-05-11: qty 1000

## 2015-05-11 MED ORDER — FAMOTIDINE IN NACL 20-0.9 MG/50ML-% IV SOLN
20.0000 mg | Freq: Once | INTRAVENOUS | Status: AC
  Administered 2015-05-11: 20 mg via INTRAVENOUS
  Filled 2015-05-11: qty 50

## 2015-05-11 MED ORDER — SODIUM CHLORIDE 0.9 % IV SOLN
Freq: Once | INTRAVENOUS | Status: AC
  Administered 2015-05-11: 14:00:00 via INTRAVENOUS
  Filled 2015-05-11: qty 4

## 2015-05-11 MED ORDER — SODIUM CHLORIDE 0.9 % IJ SOLN
10.0000 mL | INTRAMUSCULAR | Status: DC | PRN
  Filled 2015-05-11: qty 10

## 2015-05-11 MED ORDER — PACLITAXEL CHEMO INJECTION 300 MG/50ML
90.0000 mg/m2 | Freq: Once | INTRAVENOUS | Status: AC
  Administered 2015-05-11: 240 mg via INTRAVENOUS
  Filled 2015-05-11: qty 40

## 2015-05-12 NOTE — Progress Notes (Signed)
05/11/15 @13 :40pm Leellen Chavero returns to clinic this afternoon for consideration of Cycle 4, Day 8 Taxol infusion. She did complete her PRO-CTCAE phone survey, and this was reviewed with her prior to infusion. She is reporting a little more difficulty with constipation and states that if she does not take her stool softeners three times daily, she becomes constipated and must then take a laxative. Denies any problems at present and states this is now resolved. She does continue to experience fatigue, and states this is about the same as before. She describes it as feeling "just tired." Lymphedema still present, but improved movement reported by patient. She denies any new adverse events at this time. CBC results are within acceptable parameters for patient to receive her Taxol infusion. VS are stable for patient. Dr. Oliva Bustard aware and he has signed treatment orders. Will proceed with Day 8 Taxol as scheduled. Current AEs with grade and attribution as follows:        Alopecia - grade 2; definitely attributed to Taxol Lymphedema - grade 2; unrelated (baseline) Fatigue - grade 1; definitely related    Constipation - grade 1; possibly related to Taxol

## 2015-05-14 ENCOUNTER — Ambulatory Visit: Payer: BC Managed Care – PPO | Admitting: Surgery

## 2015-05-18 ENCOUNTER — Inpatient Hospital Stay: Payer: BC Managed Care – PPO

## 2015-05-18 ENCOUNTER — Other Ambulatory Visit: Payer: Self-pay | Admitting: Internal Medicine

## 2015-05-18 ENCOUNTER — Encounter: Payer: Self-pay | Admitting: *Deleted

## 2015-05-18 VITALS — BP 116/70 | HR 111 | Temp 97.2°F | Resp 20

## 2015-05-18 DIAGNOSIS — C50911 Malignant neoplasm of unspecified site of right female breast: Secondary | ICD-10-CM

## 2015-05-18 DIAGNOSIS — C7951 Secondary malignant neoplasm of bone: Principal | ICD-10-CM

## 2015-05-18 DIAGNOSIS — Z5111 Encounter for antineoplastic chemotherapy: Secondary | ICD-10-CM | POA: Diagnosis not present

## 2015-05-18 LAB — URINALYSIS COMPLETE WITH MICROSCOPIC (ARMC ONLY)
BILIRUBIN URINE: NEGATIVE
GLUCOSE, UA: NEGATIVE mg/dL
Ketones, ur: NEGATIVE mg/dL
Nitrite: NEGATIVE
Protein, ur: 30 mg/dL — AB
SPECIFIC GRAVITY, URINE: 1.02 (ref 1.005–1.030)
Trans Epithel, UA: 1
pH: 5 (ref 5.0–8.0)

## 2015-05-18 LAB — CBC WITH DIFFERENTIAL/PLATELET
Basophils Absolute: 0.1 10*3/uL (ref 0–0.1)
Basophils Relative: 1 %
Eosinophils Absolute: 0.2 10*3/uL (ref 0–0.7)
Eosinophils Relative: 3 %
HCT: 33.7 % — ABNORMAL LOW (ref 35.0–47.0)
HEMOGLOBIN: 10.8 g/dL — AB (ref 12.0–16.0)
LYMPHS ABS: 1.1 10*3/uL (ref 1.0–3.6)
LYMPHS PCT: 20 %
MCH: 27.8 pg (ref 26.0–34.0)
MCHC: 32 g/dL (ref 32.0–36.0)
MCV: 87 fL (ref 80.0–100.0)
Monocytes Absolute: 0.4 10*3/uL (ref 0.2–0.9)
Monocytes Relative: 6 %
NEUTROS PCT: 70 %
Neutro Abs: 4 10*3/uL (ref 1.4–6.5)
Platelets: 265 10*3/uL (ref 150–440)
RBC: 3.87 MIL/uL (ref 3.80–5.20)
RDW: 23.9 % — ABNORMAL HIGH (ref 11.5–14.5)
WBC: 5.7 10*3/uL (ref 3.6–11.0)

## 2015-05-18 MED ORDER — DEXTROSE 5 % IV SOLN
90.0000 mg/m2 | Freq: Once | INTRAVENOUS | Status: AC
  Administered 2015-05-18: 240 mg via INTRAVENOUS
  Filled 2015-05-18: qty 40

## 2015-05-18 MED ORDER — SODIUM CHLORIDE 0.9 % IJ SOLN
10.0000 mL | INTRAMUSCULAR | Status: DC | PRN
  Administered 2015-05-18: 10 mL via INTRAVENOUS
  Filled 2015-05-18: qty 10

## 2015-05-18 MED ORDER — FAMOTIDINE IN NACL 20-0.9 MG/50ML-% IV SOLN
20.0000 mg | Freq: Once | INTRAVENOUS | Status: AC
  Administered 2015-05-18: 20 mg via INTRAVENOUS
  Filled 2015-05-18: qty 50

## 2015-05-18 MED ORDER — HEPARIN SOD (PORK) LOCK FLUSH 100 UNIT/ML IV SOLN: 500.0000 [IU] | Freq: Once | INTRAVENOUS | Status: DC | PRN

## 2015-05-18 MED ORDER — SODIUM CHLORIDE 0.9 % IV SOLN
Freq: Once | INTRAVENOUS | Status: AC
  Administered 2015-05-18: 11:00:00 via INTRAVENOUS
  Filled 2015-05-18: qty 4

## 2015-05-18 MED ORDER — HEPARIN SOD (PORK) LOCK FLUSH 100 UNIT/ML IV SOLN
500.0000 [IU] | Freq: Once | INTRAVENOUS | Status: AC
  Administered 2015-05-18: 500 [IU] via INTRAVENOUS
  Filled 2015-05-18: qty 5

## 2015-05-18 MED ORDER — SODIUM CHLORIDE 0.9 % IV SOLN
Freq: Once | INTRAVENOUS | Status: AC
  Administered 2015-05-18: 11:00:00 via INTRAVENOUS
  Filled 2015-05-18: qty 1000

## 2015-05-18 MED ORDER — DIPHENHYDRAMINE HCL 50 MG/ML IJ SOLN
50.0000 mg | Freq: Once | INTRAMUSCULAR | Status: AC
  Administered 2015-05-18: 50 mg via INTRAVENOUS
  Filled 2015-05-18: qty 1

## 2015-05-18 NOTE — Progress Notes (Signed)
05/18/15 @9 :50am [name] returns to clinic this morning for consideration of Cycle 4 Day 15 Taxol infusion. She had called in at 8:31 this morning to report experiencing some blood in her urine last night and again at about 4:00 this morning. She did not take her Xarelto last night or this morning. Instructed her to continue to hold the Xarelto until we receive further instructions from Dr. Rogue Bussing, who is covering for Dr. Oliva Bustard today. Informed Dr. Rogue Bussing of patient reporting hematuria and he state we need to check a CBC and urinalysis. She was already scheduled for CBC, so U/A added to orders and both have been collected. Patient reports she is doing about the same aside from the hematuria. She does continue to experience some sinus drainage, which is worrisome. Informed Dr. Jacinto Reap. Of this as well, and he has suggested that she try Claritin. Patient reports currently taking Allegra. Inst to stop the Allegra and try Claritin instead to see if this makes a difference. Patient also reports she is not drinking enough po fluids. Inst that she needs at least 2 quarts of fluids - preferably water each day. Patient agrees to increase her intake of po fluids. CBC results are within acceptable parameters for patient to receive her Taxol. VS stable. Per Dr. Jacinto Reap, OK to proceed with Taxol infusion. AEs with grade and attribution are as follows:        Alopecia - grade 2; definitely attributed to Taxol Lymphedema - grade 2; unrelated (baseline) Fatigue - grade 1; definitely related Constipation - grade 1; possibly related to Taxol    Hematuria - grade 1; unrelated to Taxol (related to Xarelto)    Vascular Access complication - grade 3; [id number] diagnosed with CT scan on 04/30/15)  05/18/2015 @11 :40am Results of U/A reviewed with Dr. Rogue Bussing, who has ordered that [name] continue to hold her  Xarelto until Thursday, 05/21/15 because she does have some hematuria. At that time she should resume taking Xarelto 15mg  po only once daily. He also wants the patient to return to the [facility] on Monday - 05/25/15 for another CBC. Patient was informed of this and questions whether she should stop taking asiprin as well. Inst to stop the aspirin and not to restart it unless Dr. Oliva Bustard instructs her to take it again. She also asks if it is OK to take the Xarelto in the evening when she restarts it once daily. Inst that this will be OK. Also informed patient that Dr. Rogue Bussing has ordered a CBC for her on 05/25/15 and she will get an appt. For this. Inst to wait for results of CBC on Monday, and patient agrees. [name] is able to correctly repeat back all of these instructions including her need to increase her fluid intake. Her dtr. Is present to reinforce these instructions. Patient also instructed how to contact the MD on call in case she develops problems over the longThanksgiving holiday and weekend.

## 2015-05-19 ENCOUNTER — Ambulatory Visit: Payer: BC Managed Care – PPO | Admitting: Surgery

## 2015-05-25 ENCOUNTER — Telehealth: Payer: Self-pay | Admitting: *Deleted

## 2015-05-25 ENCOUNTER — Inpatient Hospital Stay: Payer: BC Managed Care – PPO

## 2015-05-25 ENCOUNTER — Encounter: Payer: Self-pay | Admitting: *Deleted

## 2015-05-25 DIAGNOSIS — C7951 Secondary malignant neoplasm of bone: Principal | ICD-10-CM

## 2015-05-25 DIAGNOSIS — C50911 Malignant neoplasm of unspecified site of right female breast: Secondary | ICD-10-CM

## 2015-05-25 DIAGNOSIS — Z5111 Encounter for antineoplastic chemotherapy: Secondary | ICD-10-CM | POA: Diagnosis not present

## 2015-05-25 LAB — CBC WITH DIFFERENTIAL/PLATELET
BASOS PCT: 1 %
Basophils Absolute: 0.1 10*3/uL (ref 0–0.1)
Eosinophils Absolute: 0.1 10*3/uL (ref 0–0.7)
Eosinophils Relative: 2 %
HEMATOCRIT: 34.5 % — AB (ref 35.0–47.0)
HEMOGLOBIN: 11.2 g/dL — AB (ref 12.0–16.0)
LYMPHS ABS: 1.1 10*3/uL (ref 1.0–3.6)
Lymphocytes Relative: 19 %
MCH: 28.4 pg (ref 26.0–34.0)
MCHC: 32.4 g/dL (ref 32.0–36.0)
MCV: 87.8 fL (ref 80.0–100.0)
MONOS PCT: 6 %
Monocytes Absolute: 0.4 10*3/uL (ref 0.2–0.9)
NEUTROS ABS: 4.1 10*3/uL (ref 1.4–6.5)
Neutrophils Relative %: 72 %
Platelets: 293 10*3/uL (ref 150–440)
RBC: 3.93 MIL/uL (ref 3.80–5.20)
RDW: 24.8 % — ABNORMAL HIGH (ref 11.5–14.5)
WBC: 5.8 10*3/uL (ref 3.6–11.0)

## 2015-05-25 LAB — COMPREHENSIVE METABOLIC PANEL
ALBUMIN: 3.4 g/dL — AB (ref 3.5–5.0)
ALK PHOS: 56 U/L (ref 38–126)
ALT: 14 U/L (ref 14–54)
ANION GAP: 10 (ref 5–15)
AST: 17 U/L (ref 15–41)
BILIRUBIN TOTAL: 0.4 mg/dL (ref 0.3–1.2)
BUN: 13 mg/dL (ref 6–20)
CALCIUM: 9 mg/dL (ref 8.9–10.3)
CO2: 24 mmol/L (ref 22–32)
CREATININE: 0.68 mg/dL (ref 0.44–1.00)
Chloride: 101 mmol/L (ref 101–111)
GFR calc Af Amer: >60 mL/min (ref >60)
GFR calc non Af Amer: >60 mL/min (ref >60)
GLUCOSE: 161 mg/dL — AB (ref 65–99)
Potassium: 3.8 mmol/L (ref 3.5–5.1)
Sodium: 135 mmol/L (ref 135–145)
TOTAL PROTEIN: 7.1 g/dL (ref 6.5–8.1)

## 2015-05-25 NOTE — Telephone Encounter (Addendum)
05/25/15 @8 :35am T/C from [name] reporting that she experienced hematuria again this morning at 4:00am. States she has appt to come in at 10:45 for labs only this morning. Instructed her that I will let Dr. Oliva Bustard know about the hematuria and call her back with further instructions.  05/25/15 @8 :58am Spoke with Dr. Oliva Bustard who states have patient hold Xarelto for now. States he may change her to another medication and possibly send her for a cystoscopy. T/C made back to patient and informed of above. Instructed her not to take the Xarelto for now until Dr. Oliva Bustard determines what he is going to do. Meanwhile she is to come in as scheduled for CBC and wait for the results. Patient able to repeat back instructions to RN.   05/25/15 @13 :55 T/C made to patient [name] to inform of her return appt on Thursday - 06/17/15 per Dr. Oliva Bustard at 11:15am. Patient repeats back the date and time and states "that sounds good." KSS

## 2015-05-25 NOTE — Progress Notes (Signed)
05/25/15 @11 :15 [name] presented to [facility] this morning for lab only appt. CBC has resulted and Dr. Oliva Bustard reviewed it initially stating that the patient should stay off the Xarelto until she returns to clinic. When patient was given instructions, she additionally reported experiencing a nose bleed last night, which was only "a drop of blood" and not a large amount. She also reported having been on Plavix and 81mg  Aspirin for nearly 10 years due to cardiac stents, and never experienced any bleeding during that time. Dr. Oliva Bustard informed of nosebleed as well and has requested that the patient come back to clinic to see him this week on Thursday - 05/28/15. In the meantime, he wants [name] to begin taking aspirin 81mg  daily and if no further bleeding after 48 hours, she can resume Plavix. She is to call us if she does experience further hematuria, nosebleeds or swelling of her right arm. Patient given all of these instructions and she is able to correctly repeat them back to this RN. Daughter present during this discussion. Will call patient later today to let her know what time her appointment is on Thursday after it has been scheduled.

## 2015-05-26 ENCOUNTER — Ambulatory Visit: Payer: BC Managed Care – PPO | Admitting: Surgery

## 2015-05-28 ENCOUNTER — Inpatient Hospital Stay: Payer: BC Managed Care – PPO | Attending: Oncology | Admitting: Oncology

## 2015-05-28 ENCOUNTER — Encounter: Payer: Self-pay | Admitting: Oncology

## 2015-05-28 VITALS — BP 147/81 | HR 108 | Temp 98.7°F | Wt 340.2 lb

## 2015-05-28 DIAGNOSIS — C7989 Secondary malignant neoplasm of other specified sites: Secondary | ICD-10-CM | POA: Diagnosis not present

## 2015-05-28 DIAGNOSIS — R040 Epistaxis: Secondary | ICD-10-CM | POA: Insufficient documentation

## 2015-05-28 DIAGNOSIS — Z170 Estrogen receptor positive status [ER+]: Secondary | ICD-10-CM

## 2015-05-28 DIAGNOSIS — I10 Essential (primary) hypertension: Secondary | ICD-10-CM | POA: Diagnosis not present

## 2015-05-28 DIAGNOSIS — Y828 Other medical devices associated with adverse incidents: Secondary | ICD-10-CM | POA: Insufficient documentation

## 2015-05-28 DIAGNOSIS — C7970 Secondary malignant neoplasm of unspecified adrenal gland: Secondary | ICD-10-CM | POA: Diagnosis not present

## 2015-05-28 DIAGNOSIS — Z955 Presence of coronary angioplasty implant and graft: Secondary | ICD-10-CM | POA: Insufficient documentation

## 2015-05-28 DIAGNOSIS — C7951 Secondary malignant neoplasm of bone: Secondary | ICD-10-CM | POA: Diagnosis not present

## 2015-05-28 DIAGNOSIS — Z780 Asymptomatic menopausal state: Secondary | ICD-10-CM | POA: Diagnosis not present

## 2015-05-28 DIAGNOSIS — Z7901 Long term (current) use of anticoagulants: Secondary | ICD-10-CM

## 2015-05-28 DIAGNOSIS — Z7982 Long term (current) use of aspirin: Secondary | ICD-10-CM | POA: Insufficient documentation

## 2015-05-28 DIAGNOSIS — C50911 Malignant neoplasm of unspecified site of right female breast: Secondary | ICD-10-CM | POA: Diagnosis not present

## 2015-05-28 DIAGNOSIS — Z79899 Other long term (current) drug therapy: Secondary | ICD-10-CM | POA: Diagnosis not present

## 2015-05-28 DIAGNOSIS — Z5111 Encounter for antineoplastic chemotherapy: Secondary | ICD-10-CM | POA: Insufficient documentation

## 2015-05-28 DIAGNOSIS — G4730 Sleep apnea, unspecified: Secondary | ICD-10-CM | POA: Insufficient documentation

## 2015-05-28 DIAGNOSIS — I252 Old myocardial infarction: Secondary | ICD-10-CM | POA: Insufficient documentation

## 2015-05-28 DIAGNOSIS — Z87891 Personal history of nicotine dependence: Secondary | ICD-10-CM | POA: Insufficient documentation

## 2015-05-28 DIAGNOSIS — T82868D Thrombosis of vascular prosthetic devices, implants and grafts, subsequent encounter: Secondary | ICD-10-CM | POA: Insufficient documentation

## 2015-05-28 DIAGNOSIS — Z006 Encounter for examination for normal comparison and control in clinical research program: Secondary | ICD-10-CM | POA: Diagnosis not present

## 2015-05-28 DIAGNOSIS — R319 Hematuria, unspecified: Secondary | ICD-10-CM | POA: Diagnosis not present

## 2015-05-28 DIAGNOSIS — I890 Lymphedema, not elsewhere classified: Secondary | ICD-10-CM | POA: Diagnosis not present

## 2015-05-28 NOTE — Progress Notes (Signed)
[place]  [phone]  [phone]     [name] DOB: 1955/09/04  MR#: [id number]  SAY#:[id number]  Patient Care Team: Kirk Ruths, MD as PCP - General (Internal Medicine)  CHIEF COMPLAINT:  No chief complaint on file.   This 59 y.o. female patient presents to the clinic for follow-up metastatic breast cancer.   Chief Complaint/Problem List: 1. Breast cancer see also 07/08/13 note, initially a T4 N2 M1 ER positive HER-2 negative stage IV disease 2. Anemia iron deficiency attributed to prior bleeding from wound C earlier documents 3. Right arm lymphedema prior cellulitis 4. Right breast nonhealing but stable wound 5. Past history of hypertension hyperlipidemia coronary disease stenting      ONCOLOGY HISTORY: Oncology History   1. 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 Previously noted long-term bleeding from the wound of right breast associated with cancer. 2. Patient has extensive right arm lymphedema. Patient is limited in range of motion due to lymphedema and pain in right arm. 3. Patient was started on Xgeva and letrozole in January 2015, [name] was started in April 2015. 4. CEA at lowest 7.4, failure of Ibrance and letrozole with steady rise in CEA. Most recent CEA in May 2016 reported as 223.    6.[place] XGEVA FIRST [place] 06/2013 LETRAZOLE 07/12/13  IBRANCE 10/10/13. ibrance 12/27/13..cea lowest at 7.4, but failure of ibrance and letrozole as steady rise since      Stage IV breast cancer with metastases to bone and adrenal gland.  INTERVAL HISTORY:  59 year old lady came today further follow-up regarding stage IV carcinoma of breast.  Since last evaluation Patient had received a complete course of chemotherapy this was the third cycle of treatment Patient is getting treatment under protocol. Patient has developed  hematuria and epistaxis was on anticoagulation dose   Of  xeralto  was decreased at 15 mg but she continued to have hematuria.  Anticoagulation was discontinued there was no hematuria or epistaxis. ROS GENERAL:  Feels good.  Active.  No fevers, sweats or weight loss. PERFORMANCE STATUS (ECOG):  0 HEENT:  No visual changes, runny nose, sore throat, mouth sores or tenderness.epistaxis which is resolved Lungs: No shortness of breath or cough.  No hemoptysis. Cardiac:  No chest pain, palpitations, orthopnea, or PND. GI:  No nausea, vomiting, diarrhea, constipation, melena or hematochezia. GU:  No urgency, frequency, dysuria, hematuria has resolvedMusculoskeletal:  No back pain.  No joint pain.  No muscle tenderness. Extremities:  Right upper extremity swelling Skin:  No rashes or skin changes. Neuro:  No headache, numbness or weakness, balance or coordination issues. Endocrine:  No diabetes, thyroid issues, hot flashes or night sweats. Psych:  No mood changes, depression or anxiety. Pain:  No focal pain. Review of systems:  All other systems reviewed and found to be negative. Breast wound is slowly healing.  Patient is attending wound care center once a month   PAST MEDICAL HISTORY: Past Medical History  Diagnosis Date  . Hypertension   . Myocardial infarction ([place])   . Anginal pain ([place])   . Sleep apnea   . Anemia   . Post-menopausal 2006  . Breast cancer ([place])   . Breast cancer metastasized to bone ([place]) 11/21/2014    PAST SURGICAL HISTORY: Past Surgical History  Procedure Laterality Date  . Coronary angioplasty with stent placement N/A   . Hernia  repair      umbilical hernia repair  . Portacath placement Right 02/03/2015    Procedure: INSERTION PORT-A-CATH;  Surgeon: [name], MD;  Location: [facility] ORS;  Service: General;  Laterality: Right;    FAMILY HISTORY There is no significant family history of breast cancer, ovarian cancer, colon cancer    ADVANCED  DIRECTIVES:  Patient does not have any living will or healthcare power of attorney.  Information was given .  Available resources had been discussed.  We will follow-up on subsequent appointments regarding this issue  HEALTH MAINTENANCE: Social History  Substance Use Topics  . Smoking status: Former Smoker -- 1.00 packs/day    Types: Cigarettes    Quit date: 10/25/2004  . Smokeless tobacco: Never Used     Comment: stopped smoking greater than 7 years ago  . Alcohol Use: No       Allergies  Allergen Reactions  . No Known Allergies     Current Outpatient Prescriptions  Medication Sig Dispense Refill  . acetaminophen (TYLENOL) 500 MG tablet Take 1,000 mg by mouth every 6 (six) hours as needed.    Marland Kitchen aspirin EC 81 MG tablet Take 81 mg by mouth daily.    . clopidogrel (PLAVIX) 75 MG tablet Take 75 mg by mouth daily.    . [employer] CALCIUM 600+D 600-800 MG-UNIT TABS Take 1 tablet by mouth daily. At noon  3  . enalapril (VASOTEC) 5 MG tablet   2  . fexofenadine (ALLEGRA) 180 MG tablet Take 180 mg by mouth at bedtime.    . furosemide (LASIX) 20 MG tablet Take 20 mg by mouth daily.  1  . lidocaine-prilocaine (EMLA) cream Apply 1 application topically as needed. 30 g 3  . lisinopril (PRINIVIL,ZESTRIL) 20 MG tablet Take 20 mg by mouth daily.    . metoprolol succinate (TOPROL-XL) 50 MG 24 hr tablet Take 50 mg by mouth 2 (two) times daily. Take with or immediately following a meal.    . omega-3 acid ethyl esters (LOVAZA) 1 G capsule Take 1 g by mouth at bedtime.    . ondansetron (ZOFRAN) 8 MG tablet Take 1 tablet (8 mg total) by mouth 2 (two) times daily. Start the day after chemo for 2 days. Then take as needed for nausea or vomiting. 30 tablet 1  . phosphorus (PHOSPHA 250 NEUTRAL) 155-852-130 MG tablet Take 1 tablet (250 mg total) by mouth 2 (two) times daily. 60 tablet 6  . potassium chloride (K-DUR) 10 MEQ tablet Take 10 mEq by mouth daily.   1  . simvastatin (ZOCOR) 80 MG tablet Take 80 mg by  mouth at bedtime.    . [DISCONTINUED] rivaroxaban (XARELTO) 20 MG TABS tablet Take 1 tablet (20 mg total) by mouth daily with supper. To start once finishes Xarelto 19m prescription. 30 tablet 3   No current facility-administered medications for this visit.    OBJECTIVE: There were no vitals taken for this visit.   There is no weight on file to calculate BMI.    ECOG FS:0 - Asymptomatic  General: Well-developed, well-nourished, no acute distress. Eyes: Pink conjunctiva, anicteric sclera. HEENT: Normocephalic, moist mucous membranes, clear oropharnyx. Lungs: Clear to auscultation bilaterally. Heart: Regular rate and rhythm. No rubs, murmurs, or gallops. Abdomen: Soft, nontender, nondistended. No organomegaly noted, normoactive bowel sounds. Musculoskeletal: No edema, cyanosis, or clubbing. Neuro: Alert, answering all questions appropriately. Cranial nerves grossly intact. Skin: No rashes or petechiae noted. Psych: Normal affect. Lymphatics: No cervical, calvicular, axillary or inguinal  LAD.  Right upper extremity lymphedema is gradually getting better.  Is grade 3 lymphedema. Right breast: In duration persist however open wound is gradually healing.  There are multiple lady is a bleeding.   LAB RESULTS: Component     Latest Ref Rng 01/30/2014 02/26/2014 03/20/2014 04/24/2014 05/13/2014  CEA     0.0 - 4.7 ng/mL 15.4 (H) 31.4 (H) 33.5 (H) 66.4 (H) 73.7 (H)   Component     Latest Ref Rng 06/05/2014 08/01/2014 10/02/2014 10/30/2014 12/25/2014  CEA     0.0 - 4.7 ng/mL 75.8 (H) 91.8 (H) 173.1 (H) 223.1 (H) 367.2 (H)            No visits with results within 3 Day(s) from this visit. Latest known visit with results is:  Appointment on 05/25/2015  Component Date Value Ref Range Status  . WBC 05/25/2015 5.8  3.6 - 11.0 K/uL Final  . RBC 05/25/2015 3.93  3.80 - 5.20 MIL/uL Final  . Hemoglobin 05/25/2015 11.2* 12.0 - 16.0 g/dL Final  . HCT 05/25/2015 34.5* 35.0 - 47.0 % Final  . MCV  05/25/2015 87.8  80.0 - 100.0 fL Final  . MCH 05/25/2015 28.4  26.0 - 34.0 pg Final  . MCHC 05/25/2015 32.4  32.0 - 36.0 g/dL Final  . RDW 05/25/2015 24.8* 11.5 - 14.5 % Final  . Platelets 05/25/2015 293  150 - 440 K/uL Final  . Neutrophils Relative % 05/25/2015 72   Final  . Neutro Abs 05/25/2015 4.1  1.4 - 6.5 K/uL Final  . Lymphocytes Relative 05/25/2015 19   Final  . Lymphs Abs 05/25/2015 1.1  1.0 - 3.6 K/uL Final  . Monocytes Relative 05/25/2015 6   Final  . Monocytes Absolute 05/25/2015 0.4  0.2 - 0.9 K/uL Final  . Eosinophils Relative 05/25/2015 2   Final  . Eosinophils Absolute 05/25/2015 0.1  0 - 0.7 K/uL Final  . Basophils Relative 05/25/2015 1   Final  . Basophils Absolute 05/25/2015 0.1  0 - 0.1 K/uL Final  . Sodium 05/25/2015 135  135 - 145 mmol/L Final  . Potassium 05/25/2015 3.8  3.5 - 5.1 mmol/L Final  . Chloride 05/25/2015 101  101 - 111 mmol/L Final  . CO2 05/25/2015 24  22 - 32 mmol/L Final  . Glucose, Bld 05/25/2015 161* 65 - 99 mg/dL Final  . BUN 05/25/2015 13  6 - 20 mg/dL Final  . Creatinine, Ser 05/25/2015 0.68  0.44 - 1.00 mg/dL Final  . Calcium 05/25/2015 9.0  8.9 - 10.3 mg/dL Final  . Total Protein 05/25/2015 7.1  6.5 - 8.1 g/dL Final  . Albumin 05/25/2015 3.4* 3.5 - 5.0 g/dL Final  . AST 05/25/2015 17  15 - 41 U/L Final  . ALT 05/25/2015 14  14 - 54 U/L Final  . Alkaline Phosphatase 05/25/2015 56  38 - 126 U/L Final  . Total Bilirubin 05/25/2015 0.4  0.3 - 1.2 mg/dL Final  . GFR calc non Af Amer 05/25/2015 >60  >60 mL/min Final  . GFR calc Af Amer 05/25/2015 >60  >60 mL/min Final   Comment: (NOTE) The eGFR has been calculated using the CKD EPI equation. This calculation has not been validated in all clinical situations. eGFR's persistently <60 mL/min signify possible Chronic Kidney Disease.   . Anion gap 05/25/2015 10  5 - 15 Final  CLINICAL DATA: 59 year old female with history of right-sided  breast cancer diagnosed in December 2014, currently  undergoing  chemotherapy.  EXAM:  CT CHEST,  ABDOMEN, AND PELVIS WITH CONTRAST  TECHNIQUE:  Multidetector CT imaging of the chest, abdomen and pelvis was  performed following the standard protocol during bolus  administration of intravenous contrast.  CONTRAST: 184m OMNIPAQUE IOHEXOL 350 MG/ML SOLN  COMPARISON: Multiple priors, most recently CT of the chest, abdomen  and pelvis 02/05/2015.  FINDINGS:  RECIST 1.1  Target Lesions:  1. Primary right breast neoplasm significantly decreased in size,  currently measuring 4.8 cm (previously 8.3 cm).  2. Right axillary nodal conglomerate 4.7 cm short axis (previously  7.4 cm).  3. Right shoulder soft tissue metastasis (posterior to right  infraspinatus muscle) decreased to 2.6 cm (previously 4.3 cm).  4. Left adrenal metastasis 2.8 cm (previously 5.2 cm).  5. Left inguinal lymph node 9 mm (previously 2.9 cm).  Non-target Lesions: .  IMPRESSION: 1. Today's study demonstrates a positive response to therapy, with marked regression of the right breast mass, decreased size of numerous soft tissue metastases in and around the right shoulder region, decreased size of left adrenal metastasis, and marked decrease of lymphadenopathy noted throughout the chest, abdomen and pelvis, as detailed above. 2. No new metastatic lesions are identified in the chest, abdomen or pelvis. 3. 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 4. Additional incidental findings, as above. These results will be called to the ordering clinician or representative by the Radiologist Assistant, and communication documented in the PACS or zVision Dashboard    ASSESSMENT:  S tage IV breast cancer with bone metastases, adrenal metastasis.  All lab data has been reviewed.  Hematuria and epistaxis may be secondary to   xeralto . At this point in time we will take patient off any anticoagulation and start patient  back on Plavix and aspirin in next few days will run CT scan or ultrasound to see catheter related thrombosis possibility of starting  eliquis all removing port can be considered. Lymphedema has improved. Aspirin and Plavix being given because of coronary artery stent  seling patient and family regarding prognosis and options of treatment and available resources. Reviewing scan Assessment    Of  tumor And management of new onset of thrombus JForest Gleason MD   05/28/2015 11:37 AM

## 2015-06-01 ENCOUNTER — Encounter: Payer: Self-pay | Admitting: Oncology

## 2015-06-01 ENCOUNTER — Inpatient Hospital Stay: Payer: BC Managed Care – PPO

## 2015-06-01 ENCOUNTER — Inpatient Hospital Stay (HOSPITAL_BASED_OUTPATIENT_CLINIC_OR_DEPARTMENT_OTHER): Payer: BC Managed Care – PPO | Admitting: Oncology

## 2015-06-01 VITALS — BP 143/81 | HR 109 | Temp 99.2°F | Resp 18 | Wt 343.0 lb

## 2015-06-01 DIAGNOSIS — C7951 Secondary malignant neoplasm of bone: Secondary | ICD-10-CM | POA: Diagnosis not present

## 2015-06-01 DIAGNOSIS — Z7982 Long term (current) use of aspirin: Secondary | ICD-10-CM

## 2015-06-01 DIAGNOSIS — Z7901 Long term (current) use of anticoagulants: Secondary | ICD-10-CM

## 2015-06-01 DIAGNOSIS — C7989 Secondary malignant neoplasm of other specified sites: Secondary | ICD-10-CM | POA: Diagnosis not present

## 2015-06-01 DIAGNOSIS — T82868D Thrombosis of vascular prosthetic devices, implants and grafts, subsequent encounter: Secondary | ICD-10-CM

## 2015-06-01 DIAGNOSIS — Z5111 Encounter for antineoplastic chemotherapy: Secondary | ICD-10-CM | POA: Diagnosis not present

## 2015-06-01 DIAGNOSIS — Z170 Estrogen receptor positive status [ER+]: Secondary | ICD-10-CM

## 2015-06-01 DIAGNOSIS — R040 Epistaxis: Secondary | ICD-10-CM

## 2015-06-01 DIAGNOSIS — Z780 Asymptomatic menopausal state: Secondary | ICD-10-CM

## 2015-06-01 DIAGNOSIS — Z79899 Other long term (current) drug therapy: Secondary | ICD-10-CM

## 2015-06-01 DIAGNOSIS — C50911 Malignant neoplasm of unspecified site of right female breast: Secondary | ICD-10-CM

## 2015-06-01 DIAGNOSIS — G4730 Sleep apnea, unspecified: Secondary | ICD-10-CM

## 2015-06-01 DIAGNOSIS — Z006 Encounter for examination for normal comparison and control in clinical research program: Secondary | ICD-10-CM | POA: Diagnosis not present

## 2015-06-01 DIAGNOSIS — I10 Essential (primary) hypertension: Secondary | ICD-10-CM

## 2015-06-01 DIAGNOSIS — Y828 Other medical devices associated with adverse incidents: Secondary | ICD-10-CM

## 2015-06-01 DIAGNOSIS — I890 Lymphedema, not elsewhere classified: Secondary | ICD-10-CM

## 2015-06-01 DIAGNOSIS — Z955 Presence of coronary angioplasty implant and graft: Secondary | ICD-10-CM

## 2015-06-01 DIAGNOSIS — R319 Hematuria, unspecified: Secondary | ICD-10-CM

## 2015-06-01 DIAGNOSIS — I252 Old myocardial infarction: Secondary | ICD-10-CM

## 2015-06-01 DIAGNOSIS — Z87891 Personal history of nicotine dependence: Secondary | ICD-10-CM

## 2015-06-01 LAB — COMPREHENSIVE METABOLIC PANEL
ALBUMIN: 3.3 g/dL — AB (ref 3.5–5.0)
ALK PHOS: 59 U/L (ref 38–126)
ALT: 14 U/L (ref 14–54)
ANION GAP: 9 (ref 5–15)
AST: 16 U/L (ref 15–41)
BILIRUBIN TOTAL: 0.4 mg/dL (ref 0.3–1.2)
BUN: 13 mg/dL (ref 6–20)
CALCIUM: 8.7 mg/dL — AB (ref 8.9–10.3)
CO2: 21 mmol/L — AB (ref 22–32)
Chloride: 105 mmol/L (ref 101–111)
Creatinine, Ser: 0.77 mg/dL (ref 0.44–1.00)
GFR calc non Af Amer: >60 mL/min (ref >60)
GLUCOSE: 167 mg/dL — AB (ref 65–99)
POTASSIUM: 4 mmol/L (ref 3.5–5.1)
SODIUM: 135 mmol/L (ref 135–145)
TOTAL PROTEIN: 6.9 g/dL (ref 6.5–8.1)

## 2015-06-01 LAB — CBC WITH DIFFERENTIAL/PLATELET
BASOS PCT: 1 %
Basophils Absolute: 0.1 10*3/uL (ref 0–0.1)
EOS ABS: 0.2 10*3/uL (ref 0–0.7)
EOS PCT: 2 %
HCT: 35 % (ref 35.0–47.0)
Hemoglobin: 11.1 g/dL — ABNORMAL LOW (ref 12.0–16.0)
LYMPHS ABS: 1.5 10*3/uL (ref 1.0–3.6)
Lymphocytes Relative: 16 %
MCH: 27.8 pg (ref 26.0–34.0)
MCHC: 31.8 g/dL — AB (ref 32.0–36.0)
MCV: 87.3 fL (ref 80.0–100.0)
MONO ABS: 0.7 10*3/uL (ref 0.2–0.9)
MONOS PCT: 7 %
Neutro Abs: 6.9 10*3/uL — ABNORMAL HIGH (ref 1.4–6.5)
Neutrophils Relative %: 74 %
Platelets: 300 10*3/uL (ref 150–440)
RBC: 4 MIL/uL (ref 3.80–5.20)
RDW: 23.6 % — AB (ref 11.5–14.5)
WBC: 9.3 10*3/uL (ref 3.6–11.0)

## 2015-06-01 MED ORDER — DIPHENHYDRAMINE HCL 50 MG/ML IJ SOLN
50.0000 mg | Freq: Once | INTRAMUSCULAR | Status: AC
  Administered 2015-06-01: 50 mg via INTRAVENOUS
  Filled 2015-06-01: qty 1

## 2015-06-01 MED ORDER — HEPARIN SOD (PORK) LOCK FLUSH 100 UNIT/ML IV SOLN
500.0000 [IU] | Freq: Once | INTRAVENOUS | Status: AC | PRN
  Administered 2015-06-01: 500 [IU]

## 2015-06-01 MED ORDER — FAMOTIDINE IN NACL 20-0.9 MG/50ML-% IV SOLN
20.0000 mg | Freq: Once | INTRAVENOUS | Status: AC
  Administered 2015-06-01: 20 mg via INTRAVENOUS

## 2015-06-01 MED ORDER — DEXTROSE 5 % IV SOLN
90.0000 mg/m2 | Freq: Once | INTRAVENOUS | Status: AC
  Administered 2015-06-01: 240 mg via INTRAVENOUS
  Filled 2015-06-01: qty 40

## 2015-06-01 MED ORDER — HEPARIN SOD (PORK) LOCK FLUSH 100 UNIT/ML IV SOLN
500.0000 [IU] | Freq: Once | INTRAVENOUS | Status: DC
  Filled 2015-06-01: qty 5

## 2015-06-01 MED ORDER — SODIUM CHLORIDE 0.9 % IJ SOLN
10.0000 mL | INTRAMUSCULAR | Status: DC | PRN
Start: 2015-06-01 — End: 2015-06-01
  Administered 2015-06-01: 10 mL via INTRAVENOUS
  Filled 2015-06-01: qty 10

## 2015-06-01 MED ORDER — SODIUM CHLORIDE 0.9 % IV SOLN
Freq: Once | INTRAVENOUS | Status: AC
  Administered 2015-06-01: 12:00:00 via INTRAVENOUS
  Filled 2015-06-01: qty 1000

## 2015-06-01 MED ORDER — SODIUM CHLORIDE 0.9 % IV SOLN
Freq: Once | INTRAVENOUS | Status: AC
  Administered 2015-06-01: 13:00:00 via INTRAVENOUS
  Filled 2015-06-01: qty 4

## 2015-06-01 NOTE — Progress Notes (Signed)
06/01/1015 @10 :45am  Patient returns to clinic today for cycle 5-day 1 taxol per protocol [id number]. Patient reports no changes today. Patient reports no further hematuria at this time. Dr. Oliva Bustard did not require Hematology appointment at this time. Dr. Oliva Bustard discontinued Xarelto, patient will continue aspirin and Plavix. Dr. Oliva Bustard has assessed patient including her right outer breast cutaneous lesion.  The lesion now measures 9 X 4.5cm. She continues to be seen at the wound clinic. Dr. Oliva Bustard questioned when next CT scan is due which is last week in January per protocol.   Patient completed the PRO questionnaire required for cycle 5. Patient's labs (CBC and Chemistries) were within acceptable parameters to proceed with Taxol infusion. Reviewed phone PRO-CTCAE survey with patient. Patient who reports symptoms are essentially unchanged. Patient specifically denies numbness and tingling and her only complaint continues to be fatigue. Return appointments scheduled per protocol. Per Dr. Oliva Bustard, patient scheduled for chest CT to follow up thrombus on porta-cath.                                       Alopecia - grade 2; definitely attributed to Taxol Lymphedema - grade 2; unrelated (baseline) Fatigue - grade 1; definitely related

## 2015-06-02 LAB — CEA: CEA: 9.1 ng/mL — ABNORMAL HIGH (ref 0.0–4.7)

## 2015-06-02 NOTE — Progress Notes (Signed)
Late entry/Addendum to research encounter note dated 05/04/2015 Related to patient's AE regarding thrombus identified during CT scan performed on 04/30/15: Dr. Oliva Bustard has changed the patient from Plavix to Xarelto due to this new thrombus and patient has been previously instructed on the medication changes. Per Dr. Oliva Bustard this AE grade and attribution as follows:    Vascular access complication - grade 3 per CTCAE v4.03; and unrelated to Taxol

## 2015-06-03 ENCOUNTER — Encounter: Payer: Self-pay | Admitting: Oncology

## 2015-06-03 NOTE — Progress Notes (Signed)
[place]  [phone]  [phone]     [name] DOB: [birth year]  MR#: [id number]  DGL#:[id number]  Patient Care Team: Kirk Ruths, MD as PCP - General (Internal Medicine)  CHIEF COMPLAINT:  Chief Complaint  Patient presents with  . Breast Cancer    This 59 y.o. female patient presents to the clinic for follow-up metastatic breast cancer.   Chief Complaint/Problem List: 1. Breast cancer see also 07/08/13 note, initially a T4 N2 M1 ER positive HER-2 negative stage IV disease 2. Anemia iron deficiency attributed to prior bleeding from wound C earlier documents 3. Right arm lymphedema prior cellulitis 4. Right breast nonhealing but stable wound 5. Past history of hypertension hyperlipidemia coronary disease stenting      ONCOLOGY HISTORY: Oncology History   1. 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 Previously noted long-term bleeding from the wound of right breast associated with cancer. 2. Patient has extensive right arm lymphedema. Patient is limited in range of motion due to lymphedema and pain in right arm. 3. Patient was started on Xgeva and letrozole in January 2015, [name] was started in April 2015. 4. CEA at lowest 7.4, failure of Ibrance and letrozole with steady rise in CEA. Most recent CEA in May 2016 reported as 223.    6.TX XGEVA FIRST [place] 06/2013 LETRAZOLE 07/12/13  IBRANCE 10/10/13. ibrance 12/27/13..cea lowest at 7.4, but failure of ibrance and letrozole as steady rise since      Stage IV breast cancer with metastases to bone and adrenal gland.  INTERVAL HISTORY:  59 year old lady came today further follow-up regarding stage IV carcinoma of breast.  Since last evaluation Patient had received a complete course of chemotherapy this was the third cycle of treatment Patient is getting treatment under  protocol. Patient has developed hematuria and epistaxis was on anticoagulation dose   Of  xeralto  was decreased at 15 mg but she continued to have hematuria.  Anticoagulation was discontinued there was no hematuria or epistaxis.   Patient has been taken off xeralto after that did not have any hematuria.  Patient is here for further follow-up regarding stage IV carcinoma of breast on protocol with Taxol chemotherapy Tingling no numbness.  Lymphedema in right upper extremity is healing.  Breast wound is healing. ROS GENERAL:  Feels good.  Active.  No fevers, sweats or weight loss. PERFORMANCE STATUS (ECOG):  0 HEENT:  No visual changes, runny nose, sore throat, mouth sores or tenderness.epistaxis which is resolved Lungs: No shortness of breath or cough.  No hemoptysis. Cardiac:  No chest pain, palpitations, orthopnea, or PND. GI:  No nausea, vomiting, diarrhea, constipation, melena or hematochezia. GU:  No urgency, frequency, dysuria, hematuria has resolvedMusculoskeletal:  No back pain.  No joint pain.  No muscle tenderness. Extremities:  Right upper extremity swelling Skin:  No rashes or skin changes. Neuro:  No headache, numbness or weakness, balance or coordination issues. Endocrine:  No diabetes, thyroid issues, hot flashes or night sweats. Psych:  No mood changes, depression or anxiety. Pain:  No focal pain. Review of systems:  All other systems reviewed and found to be negative. Breast wound is slowly healing.  Patient is attending wound care center once a month   PAST MEDICAL HISTORY: Past Medical History  Diagnosis Date  . Hypertension   . Myocardial infarction ([place])   . Anginal pain (  [place])   . Sleep apnea   . Anemia   . Post-menopausal 2006  . Breast cancer ([place])   . Breast cancer metastasized to bone ([place]) 11/21/2014    PAST SURGICAL HISTORY: Past Surgical History  Procedure Laterality Date  . Coronary angioplasty with stent placement N/A   . Hernia repair       umbilical hernia repair  . Portacath placement Right 02/03/2015    Procedure: INSERTION PORT-A-CATH;  Surgeon: [name], MD;  Location: [facility] ORS;  Service: General;  Laterality: Right;    FAMILY HISTORY There is no significant family history of breast cancer, ovarian cancer, colon cancer    ADVANCED DIRECTIVES:  Patient does not have any living will or healthcare power of attorney.  Information was given .  Available resources had been discussed.  We will follow-up on subsequent appointments regarding this issue  HEALTH MAINTENANCE: Social History  Substance Use Topics  . Smoking status: Former Smoker -- 1.00 packs/day    Types: Cigarettes    Quit date: 10/25/2004  . Smokeless tobacco: Never Used     Comment: stopped smoking greater than 7 years ago  . Alcohol Use: No       Allergies  Allergen Reactions  . No Known Allergies     Current Outpatient Prescriptions  Medication Sig Dispense Refill  . acetaminophen (TYLENOL) 500 MG tablet Take 1,000 mg by mouth every 6 (six) hours as needed.    Marland Kitchen aspirin EC 81 MG tablet Take 81 mg by mouth daily.    . clopidogrel (PLAVIX) 75 MG tablet Take 75 mg by mouth daily.    . [employer] CALCIUM 600+D 600-800 MG-UNIT TABS Take 1 tablet by mouth daily. At noon  3  . fexofenadine (ALLEGRA) 180 MG tablet Take 180 mg by mouth at bedtime.    . furosemide (LASIX) 20 MG tablet Take 20 mg by mouth daily.  1  . lidocaine-prilocaine (EMLA) cream Apply 1 application topically as needed. 30 g 3  . lisinopril (PRINIVIL,ZESTRIL) 20 MG tablet Take 20 mg by mouth daily.    . metoprolol succinate (TOPROL-XL) 50 MG 24 hr tablet Take 50 mg by mouth 2 (two) times daily. Take with or immediately following a meal.    . omega-3 acid ethyl esters (LOVAZA) 1 G capsule Take 1 g by mouth at bedtime.    . ondansetron (ZOFRAN) 8 MG tablet Take 1 tablet (8 mg total) by mouth 2 (two) times daily. Start the day after chemo for 2 days. Then take as needed for nausea or  vomiting. 30 tablet 1  . phosphorus (PHOSPHA 250 NEUTRAL) 155-852-130 MG tablet Take 1 tablet (250 mg total) by mouth 2 (two) times daily. 60 tablet 6  . potassium chloride (K-DUR) 10 MEQ tablet Take 10 mEq by mouth daily.   1  . simvastatin (ZOCOR) 80 MG tablet Take 80 mg by mouth at bedtime.    . [DISCONTINUED] rivaroxaban (XARELTO) 20 MG TABS tablet Take 1 tablet (20 mg total) by mouth daily with supper. To start once finishes Xarelto 89m prescription. 30 tablet 3   No current facility-administered medications for this visit.    OBJECTIVE: BP 143/81 mmHg  Pulse 109  Temp(Src) 99.2 F (37.3 C) (Tympanic)  Resp 18  Wt 343 lb 0.6 oz (155.6 kg)   Body mass index is 58.85 kg/(m^2).    ECOG FS:0 - Asymptomatic  General: Well-developed, well-nourished, no acute distress. Eyes: Pink conjunctiva, anicteric sclera. HEENT: Normocephalic, moist mucous membranes, clear  oropharnyx. Lungs: Clear to auscultation bilaterally. Heart: Regular rate and rhythm. No rubs, murmurs, or gallops. Abdomen: Soft, nontender, nondistended. No organomegaly noted, normoactive bowel sounds. Musculoskeletal: No edema, cyanosis, or clubbing. Neuro: Alert, answering all questions appropriately. Cranial nerves grossly intact. Skin: No rashes or petechiae noted. Psych: Normal affect. Lymphatics: No cervical, calvicular, axillary or inguinal LAD.  Right upper extremity lymphedema is gradually getting better.  Is grade 3 lymphedema. Right breast: In duration persist however open wound is gradually healing.  There are multiple lady is a bleeding.  Right breast mass is gradually decreasing.  Wound size is now 9 x 4.5 cm and is very superficial LAB RESULTS:                          Infusion on 06/01/2015  Component Date Value Ref Range Status  . WBC 06/01/2015 9.3  3.6 - 11.0 K/uL Final   A-LINE DRAW  . RBC 06/01/2015 4.00  3.80 - 5.20 MIL/uL Final  . Hemoglobin 06/01/2015 11.1* 12.0 - 16.0 g/dL Final  . HCT  06/01/2015 35.0  35.0 - 47.0 % Final  . MCV 06/01/2015 87.3  80.0 - 100.0 fL Final  . MCH 06/01/2015 27.8  26.0 - 34.0 pg Final  . MCHC 06/01/2015 31.8* 32.0 - 36.0 g/dL Final  . RDW 06/01/2015 23.6* 11.5 - 14.5 % Final  . Platelets 06/01/2015 300  150 - 440 K/uL Final  . Neutrophils Relative % 06/01/2015 74   Final  . Neutro Abs 06/01/2015 6.9* 1.4 - 6.5 K/uL Final  . Lymphocytes Relative 06/01/2015 16   Final  . Lymphs Abs 06/01/2015 1.5  1.0 - 3.6 K/uL Final  . Monocytes Relative 06/01/2015 7   Final  . Monocytes Absolute 06/01/2015 0.7  0.2 - 0.9 K/uL Final  . Eosinophils Relative 06/01/2015 2   Final  . Eosinophils Absolute 06/01/2015 0.2  0 - 0.7 K/uL Final  . Basophils Relative 06/01/2015 1   Final  . Basophils Absolute 06/01/2015 0.1  0 - 0.1 K/uL Final  . Sodium 06/01/2015 135  135 - 145 mmol/L Final  . Potassium 06/01/2015 4.0  3.5 - 5.1 mmol/L Final  . Chloride 06/01/2015 105  101 - 111 mmol/L Final  . CO2 06/01/2015 21* 22 - 32 mmol/L Final  . Glucose, Bld 06/01/2015 167* 65 - 99 mg/dL Final  . BUN 06/01/2015 13  6 - 20 mg/dL Final  . Creatinine, Ser 06/01/2015 0.77  0.44 - 1.00 mg/dL Final  . Calcium 06/01/2015 8.7* 8.9 - 10.3 mg/dL Final  . Total Protein 06/01/2015 6.9  6.5 - 8.1 g/dL Final  . Albumin 06/01/2015 3.3* 3.5 - 5.0 g/dL Final  . AST 06/01/2015 16  15 - 41 U/L Final  . ALT 06/01/2015 14  14 - 54 U/L Final  . Alkaline Phosphatase 06/01/2015 59  38 - 126 U/L Final  . Total Bilirubin 06/01/2015 0.4  0.3 - 1.2 mg/dL Final  . GFR calc non Af Amer 06/01/2015 >60  >60 mL/min Final  . GFR calc Af Amer 06/01/2015 >60  >60 mL/min Final   Comment: (NOTE) The eGFR has been calculated using the CKD EPI equation. This calculation has not been validated in all clinical situations. eGFR's persistently <60 mL/min signify possible Chronic Kidney Disease.   . Anion gap 06/01/2015 9  5 - 15 Final  . CEA 06/01/2015 9.1* 0.0 - 4.7 ng/mL Final   Comment: (NOTE)  Roche  ECLIA methodology       Nonsmokers  <3.9                                     Smokers     <5.6 Performed At: [facility] [place], [place] [id number] Lindon Romp MD NZ:[id number]   CLINICAL DATA: 59 year old female with history of right-sided  breast cancer diagnosed in December 2014, currently undergoing  chemotherapy.  EXAM:  CT CHEST, ABDOMEN, AND PELVIS WITH CONTRAST  TECHNIQUE:  Multidetector CT imaging of the chest, abdomen and pelvis was  performed following the standard protocol during bolus  administration of intravenous contrast.  CONTRAST: 151m OMNIPAQUE IOHEXOL 350 MG/ML SOLN  COMPARISON: Multiple priors, most recently CT of the chest, abdomen  and pelvis 02/05/2015.  FINDINGS:  RECIST 1.1  Target Lesions:  1. Primary right breast neoplasm significantly decreased in size,  currently measuring 4.8 cm (previously 8.3 cm).  2. Right axillary nodal conglomerate 4.7 cm short axis (previously  7.4 cm).  3. Right shoulder soft tissue metastasis (posterior to right  infraspinatus muscle) decreased to 2.6 cm (previously 4.3 cm).  4. Left adrenal metastasis 2.8 cm (previously 5.2 cm).  5. Left inguinal lymph node 9 mm (previously 2.9 cm).  Non-target Lesions: .  IMPRESSION: 1. Today's study demonstrates a positive response to therapy, with marked regression of the right breast mass, decreased size of numerous soft tissue metastases in and around the right shoulder region, decreased size of left adrenal metastasis, and marked decrease of lymphadenopathy noted throughout the chest, abdomen and pelvis, as detailed above. 2. No new metastatic lesions are identified in the chest, abdomen or pelvis. 3. 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 4. Additional incidental findings, as above. These results will be called to the ordering clinician or representative by the Radiologist Assistant,  and communication documented in the PACS or zVision Dashboard    ASSESSMENT:  S tage IV breast cancer with bone metastases, adrenal metastasis. By tumor marker criteria and clinical examination and CT scan criteria patient is responding to the present chemotherapy. Patient has a thrombus related to port placement Because of bleeding caused by xeralto patient has been switched over to Plavix and aspirin and reassessment of the thrombus with CT scan If thrombus persists then possibility of   eliquis 5 mg twice a day would be considered.    if there is recurrence of hematuria then cystoscopy would be planned to rule out any underlying disease Discussed situation with research nurses  seling patient and family regarding prognosis and options of treatment and available resources. Reviewing scan Assessment    Of  tumor And management of new onset of thrombus JForest Gleason MD   06/03/2015 8:14 AM

## 2015-06-04 ENCOUNTER — Ambulatory Visit
Admission: RE | Admit: 2015-06-04 | Discharge: 2015-06-04 | Disposition: A | Discharge status: Home / Self Care | Payer: BC Managed Care – PPO | Source: Ambulatory Visit | Attending: Oncology | Admitting: Oncology

## 2015-06-04 DIAGNOSIS — C7951 Secondary malignant neoplasm of bone: Secondary | ICD-10-CM | POA: Insufficient documentation

## 2015-06-04 DIAGNOSIS — C50911 Malignant neoplasm of unspecified site of right female breast: Secondary | ICD-10-CM | POA: Insufficient documentation

## 2015-06-04 MED ORDER — IOHEXOL 300 MG/ML  SOLN
75.0000 mL | Freq: Once | INTRAMUSCULAR | Status: AC | PRN
  Administered 2015-06-04: 75 mL via INTRAVENOUS

## 2015-06-08 ENCOUNTER — Other Ambulatory Visit: Payer: BC Managed Care – PPO

## 2015-06-08 ENCOUNTER — Inpatient Hospital Stay: Payer: BC Managed Care – PPO

## 2015-06-08 VITALS — BP 136/88 | HR 100 | Temp 98.0°F | Resp 16

## 2015-06-08 DIAGNOSIS — C7951 Secondary malignant neoplasm of bone: Principal | ICD-10-CM

## 2015-06-08 DIAGNOSIS — C50911 Malignant neoplasm of unspecified site of right female breast: Secondary | ICD-10-CM

## 2015-06-08 DIAGNOSIS — Z5111 Encounter for antineoplastic chemotherapy: Secondary | ICD-10-CM | POA: Diagnosis not present

## 2015-06-08 LAB — CBC WITH DIFFERENTIAL/PLATELET
BASOS PCT: 1 %
Basophils Absolute: 0.1 10*3/uL (ref 0–0.1)
Eosinophils Absolute: 0.2 10*3/uL (ref 0–0.7)
Eosinophils Relative: 3 %
HEMATOCRIT: 34.2 % — AB (ref 35.0–47.0)
HEMOGLOBIN: 11.1 g/dL — AB (ref 12.0–16.0)
LYMPHS ABS: 1.1 10*3/uL (ref 1.0–3.6)
Lymphocytes Relative: 15 %
MCH: 28 pg (ref 26.0–34.0)
MCHC: 32.3 g/dL (ref 32.0–36.0)
MCV: 86.7 fL (ref 80.0–100.0)
MONO ABS: 0.2 10*3/uL (ref 0.2–0.9)
MONOS PCT: 3 %
NEUTROS ABS: 5.6 10*3/uL (ref 1.4–6.5)
Neutrophils Relative %: 78 %
Platelets: 313 10*3/uL (ref 150–440)
RBC: 3.94 MIL/uL (ref 3.80–5.20)
RDW: 23.1 % — AB (ref 11.5–14.5)
WBC: 7.2 10*3/uL (ref 3.6–11.0)

## 2015-06-08 LAB — COMPREHENSIVE METABOLIC PANEL
ALBUMIN: 3.4 g/dL — AB (ref 3.5–5.0)
ALK PHOS: 59 U/L (ref 38–126)
ALT: 16 U/L (ref 14–54)
AST: 18 U/L (ref 15–41)
Anion gap: 6 (ref 5–15)
BUN: 17 mg/dL (ref 6–20)
CALCIUM: 8.6 mg/dL — AB (ref 8.9–10.3)
CHLORIDE: 104 mmol/L (ref 101–111)
CO2: 23 mmol/L (ref 22–32)
CREATININE: 0.71 mg/dL (ref 0.44–1.00)
GFR calc Af Amer: >60 mL/min (ref >60)
GFR calc non Af Amer: >60 mL/min (ref >60)
GLUCOSE: 156 mg/dL — AB (ref 65–99)
Potassium: 3.6 mmol/L (ref 3.5–5.1)
SODIUM: 133 mmol/L — AB (ref 135–145)
Total Bilirubin: 0.4 mg/dL (ref 0.3–1.2)
Total Protein: 6.9 g/dL (ref 6.5–8.1)

## 2015-06-08 MED ORDER — SODIUM CHLORIDE 0.9 % IJ SOLN
10.0000 mL | INTRAMUSCULAR | Status: DC | PRN
  Filled 2015-06-08: qty 10

## 2015-06-08 MED ORDER — SODIUM CHLORIDE 0.9 % IV SOLN
Freq: Once | INTRAVENOUS | Status: AC
  Administered 2015-06-08: 11:00:00 via INTRAVENOUS
  Filled 2015-06-08: qty 1000

## 2015-06-08 MED ORDER — SODIUM CHLORIDE 0.9 % IV SOLN
Freq: Once | INTRAVENOUS | Status: AC
  Administered 2015-06-08: 11:00:00 via INTRAVENOUS
  Filled 2015-06-08: qty 4

## 2015-06-08 MED ORDER — PACLITAXEL CHEMO INJECTION 300 MG/50ML
90.0000 mg/m2 | Freq: Once | INTRAVENOUS | Status: AC
  Administered 2015-06-08: 240 mg via INTRAVENOUS
  Filled 2015-06-08: qty 40

## 2015-06-08 MED ORDER — FAMOTIDINE IN NACL 20-0.9 MG/50ML-% IV SOLN
20.0000 mg | Freq: Once | INTRAVENOUS | Status: AC
  Administered 2015-06-08: 20 mg via INTRAVENOUS
  Filled 2015-06-08: qty 50

## 2015-06-08 MED ORDER — DIPHENHYDRAMINE HCL 50 MG/ML IJ SOLN
50.0000 mg | Freq: Once | INTRAMUSCULAR | Status: AC
  Administered 2015-06-08: 50 mg via INTRAVENOUS
  Filled 2015-06-08: qty 1

## 2015-06-08 MED ORDER — HEPARIN SOD (PORK) LOCK FLUSH 100 UNIT/ML IV SOLN
500.0000 [IU] | Freq: Once | INTRAVENOUS | Status: AC
  Administered 2015-06-08: 500 [IU] via INTRAVENOUS
  Filled 2015-06-08: qty 5

## 2015-06-08 NOTE — Progress Notes (Signed)
06/08/15 @ 9:55am  Patient returns to clinic this morning for consideration of cycle 5 Day 8 Taxol infusion. Reviewed the PRO-CTCAE phone survey with patient. Patient reports the only new symptom was diarrhea yesterday. States she had a total of 4 loose stools and the diarrhea resolved without intervention which is a grade 1 per CTCAE version 4.03. She continues to report mild fatigue which is unchanged. Vital signs stable at this time. CBC and chemistries are within acceptable parameters to proceed with Taxol infusion. All above reviewed witd Dr. Oliva Bustard and he has signed orders.  Patient questioning CT results which were also reviewed with Dr. Oliva Bustard, who states no signs of residual thrombus at this time and patient should continue Plavix and aspirin. This was reviewed with patient and she verbalized understanding of plan. Patient to return in one week for Taxol infusion.                                              Alopecia - grade 2; definitely attributed to Taxol Lymphedema - grade 2; unrelated (baseline) Fatigue - grade 1; definitely related    Diarrhea- grade 1; unlikely related

## 2015-06-15 ENCOUNTER — Inpatient Hospital Stay: Payer: BC Managed Care – PPO

## 2015-06-15 ENCOUNTER — Other Ambulatory Visit: Payer: BC Managed Care – PPO

## 2015-06-15 VITALS — BP 128/65 | HR 98 | Temp 99.2°F | Resp 20

## 2015-06-15 DIAGNOSIS — Z5111 Encounter for antineoplastic chemotherapy: Secondary | ICD-10-CM | POA: Diagnosis not present

## 2015-06-15 DIAGNOSIS — C7951 Secondary malignant neoplasm of bone: Principal | ICD-10-CM

## 2015-06-15 DIAGNOSIS — C50911 Malignant neoplasm of unspecified site of right female breast: Secondary | ICD-10-CM

## 2015-06-15 LAB — CBC WITH DIFFERENTIAL/PLATELET
BASOS ABS: 0.1 10*3/uL (ref 0–0.1)
BASOS PCT: 1 %
Eosinophils Absolute: 0.1 10*3/uL (ref 0–0.7)
Eosinophils Relative: 3 %
HEMATOCRIT: 34.9 % — AB (ref 35.0–47.0)
Hemoglobin: 11.3 g/dL — ABNORMAL LOW (ref 12.0–16.0)
LYMPHS PCT: 27 %
Lymphs Abs: 1.2 10*3/uL (ref 1.0–3.6)
MCH: 28.2 pg (ref 26.0–34.0)
MCHC: 32.4 g/dL (ref 32.0–36.0)
MCV: 87.1 fL (ref 80.0–100.0)
MONO ABS: 0.3 10*3/uL (ref 0.2–0.9)
Monocytes Relative: 7 %
NEUTROS ABS: 2.6 10*3/uL (ref 1.4–6.5)
NEUTROS PCT: 62 %
PLATELETS: 283 10*3/uL (ref 150–440)
RBC: 4.01 MIL/uL (ref 3.80–5.20)
RDW: 23.3 % — ABNORMAL HIGH (ref 11.5–14.5)
WBC: 4.3 10*3/uL (ref 3.6–11.0)

## 2015-06-15 LAB — COMPREHENSIVE METABOLIC PANEL
ALBUMIN: 3.5 g/dL (ref 3.5–5.0)
ALK PHOS: 53 U/L (ref 38–126)
ALT: 17 U/L (ref 14–54)
ANION GAP: 9 (ref 5–15)
AST: 20 U/L (ref 15–41)
BILIRUBIN TOTAL: 0.4 mg/dL (ref 0.3–1.2)
BUN: 12 mg/dL (ref 6–20)
CALCIUM: 9 mg/dL (ref 8.9–10.3)
CO2: 23 mmol/L (ref 22–32)
Chloride: 105 mmol/L (ref 101–111)
Creatinine, Ser: 0.62 mg/dL (ref 0.44–1.00)
GLUCOSE: 168 mg/dL — AB (ref 65–99)
POTASSIUM: 3.5 mmol/L (ref 3.5–5.1)
Sodium: 137 mmol/L (ref 135–145)
TOTAL PROTEIN: 6.8 g/dL (ref 6.5–8.1)

## 2015-06-15 MED ORDER — SODIUM CHLORIDE 0.9 % IV SOLN
Freq: Once | INTRAVENOUS | Status: AC
  Administered 2015-06-15: 10:00:00 via INTRAVENOUS
  Filled 2015-06-15: qty 4

## 2015-06-15 MED ORDER — SODIUM CHLORIDE 0.9 % IJ SOLN
10.0000 mL | INTRAMUSCULAR | Status: DC | PRN
  Administered 2015-06-15: 10 mL
  Filled 2015-06-15: qty 10

## 2015-06-15 MED ORDER — FAMOTIDINE IN NACL 20-0.9 MG/50ML-% IV SOLN
20.0000 mg | Freq: Once | INTRAVENOUS | Status: AC
  Administered 2015-06-15: 20 mg via INTRAVENOUS
  Filled 2015-06-15: qty 50

## 2015-06-15 MED ORDER — DIPHENHYDRAMINE HCL 50 MG/ML IJ SOLN
50.0000 mg | Freq: Once | INTRAMUSCULAR | Status: AC
  Administered 2015-06-15: 50 mg via INTRAVENOUS
  Filled 2015-06-15: qty 1

## 2015-06-15 MED ORDER — PACLITAXEL CHEMO INJECTION 300 MG/50ML
90.0000 mg/m2 | Freq: Once | INTRAVENOUS | Status: AC
  Administered 2015-06-15: 240 mg via INTRAVENOUS
  Filled 2015-06-15: qty 40

## 2015-06-15 MED ORDER — HEPARIN SOD (PORK) LOCK FLUSH 100 UNIT/ML IV SOLN
500.0000 [IU] | Freq: Once | INTRAVENOUS | Status: AC | PRN
  Administered 2015-06-15: 500 [IU]
  Filled 2015-06-15: qty 5

## 2015-06-15 MED ORDER — SODIUM CHLORIDE 0.9 % IV SOLN
Freq: Once | INTRAVENOUS | Status: AC
  Administered 2015-06-15: 10:00:00 via INTRAVENOUS
  Filled 2015-06-15: qty 1000

## 2015-06-15 NOTE — Progress Notes (Signed)
06/15/15 @ 9:08am  Patient returns to clinic this morning for consideration of cycle 5 Day 15 Taxol infusion. Reviewed the PRO-CTCAE phone survey with patient. Patient reports no new symptoms today. Patient also reports diarrhea has resolved from last visit 06/08/2015. She continues to report mild fatigue which is unchanged. Vital signs stable at this time. CBC and chemistries are all within acceptable parameters to continue with scheduled Taxol infusion today. All above reviewed with Dr. Oliva Bustard and he has signed orders for today. Patient will return to clinic on June 30, 2015 for labs, see MD and Taxol infusion. Reviewed all with patient, she verbalized understanding and was instructed  to contact us if needed. Current AE's with attribution included below:      Alopecia - grade 2; definitely attributed to Taxol Lymphedema - grade 2; unrelated (baseline) Fatigue - grade 1; definitely related

## 2015-06-30 ENCOUNTER — Inpatient Hospital Stay: Payer: BC Managed Care – PPO | Attending: Oncology

## 2015-06-30 ENCOUNTER — Inpatient Hospital Stay (HOSPITAL_BASED_OUTPATIENT_CLINIC_OR_DEPARTMENT_OTHER): Payer: BC Managed Care – PPO | Admitting: Oncology

## 2015-06-30 ENCOUNTER — Inpatient Hospital Stay: Payer: BC Managed Care – PPO

## 2015-06-30 VITALS — BP 151/86 | HR 98 | Temp 98.8°F | Wt 345.7 lb

## 2015-06-30 VITALS — BP 120/77 | HR 80 | Resp 20

## 2015-06-30 DIAGNOSIS — Z006 Encounter for examination for normal comparison and control in clinical research program: Secondary | ICD-10-CM | POA: Insufficient documentation

## 2015-06-30 DIAGNOSIS — Z170 Estrogen receptor positive status [ER+]: Secondary | ICD-10-CM

## 2015-06-30 DIAGNOSIS — C7951 Secondary malignant neoplasm of bone: Secondary | ICD-10-CM | POA: Insufficient documentation

## 2015-06-30 DIAGNOSIS — Z7982 Long term (current) use of aspirin: Secondary | ICD-10-CM | POA: Diagnosis not present

## 2015-06-30 DIAGNOSIS — C7972 Secondary malignant neoplasm of left adrenal gland: Secondary | ICD-10-CM | POA: Insufficient documentation

## 2015-06-30 DIAGNOSIS — C7989 Secondary malignant neoplasm of other specified sites: Secondary | ICD-10-CM | POA: Diagnosis not present

## 2015-06-30 DIAGNOSIS — T451X5D Adverse effect of antineoplastic and immunosuppressive drugs, subsequent encounter: Secondary | ICD-10-CM | POA: Diagnosis not present

## 2015-06-30 DIAGNOSIS — G620 Drug-induced polyneuropathy: Secondary | ICD-10-CM | POA: Diagnosis not present

## 2015-06-30 DIAGNOSIS — I890 Lymphedema, not elsewhere classified: Secondary | ICD-10-CM | POA: Insufficient documentation

## 2015-06-30 DIAGNOSIS — Z87891 Personal history of nicotine dependence: Secondary | ICD-10-CM | POA: Insufficient documentation

## 2015-06-30 DIAGNOSIS — I10 Essential (primary) hypertension: Secondary | ICD-10-CM | POA: Diagnosis not present

## 2015-06-30 DIAGNOSIS — Z79899 Other long term (current) drug therapy: Secondary | ICD-10-CM | POA: Diagnosis not present

## 2015-06-30 DIAGNOSIS — C7982 Secondary malignant neoplasm of genital organs: Secondary | ICD-10-CM

## 2015-06-30 DIAGNOSIS — C50911 Malignant neoplasm of unspecified site of right female breast: Secondary | ICD-10-CM

## 2015-06-30 DIAGNOSIS — I252 Old myocardial infarction: Secondary | ICD-10-CM | POA: Diagnosis not present

## 2015-06-30 DIAGNOSIS — C778 Secondary and unspecified malignant neoplasm of lymph nodes of multiple regions: Secondary | ICD-10-CM

## 2015-06-30 DIAGNOSIS — Z5111 Encounter for antineoplastic chemotherapy: Secondary | ICD-10-CM | POA: Insufficient documentation

## 2015-06-30 LAB — CBC WITH DIFFERENTIAL/PLATELET
BASOS ABS: 0.1 10*3/uL (ref 0–0.1)
Basophils Relative: 1 %
EOS PCT: 5 %
Eosinophils Absolute: 0.3 10*3/uL (ref 0–0.7)
HEMATOCRIT: 34.8 % — AB (ref 35.0–47.0)
Hemoglobin: 11 g/dL — ABNORMAL LOW (ref 12.0–16.0)
LYMPHS PCT: 18 %
Lymphs Abs: 1.2 10*3/uL (ref 1.0–3.6)
MCH: 27.7 pg (ref 26.0–34.0)
MCHC: 31.7 g/dL — AB (ref 32.0–36.0)
MCV: 87.5 fL (ref 80.0–100.0)
MONO ABS: 0.7 10*3/uL (ref 0.2–0.9)
MONOS PCT: 11 %
NEUTROS ABS: 4.3 10*3/uL (ref 1.4–6.5)
Neutrophils Relative %: 65 %
PLATELETS: 292 10*3/uL (ref 150–440)
RBC: 3.98 MIL/uL (ref 3.80–5.20)
RDW: 22.9 % — AB (ref 11.5–14.5)
WBC: 6.6 10*3/uL (ref 3.6–11.0)

## 2015-06-30 LAB — COMPREHENSIVE METABOLIC PANEL
ALT: 15 U/L (ref 14–54)
ANION GAP: 7 (ref 5–15)
AST: 18 U/L (ref 15–41)
Albumin: 3.5 g/dL (ref 3.5–5.0)
Alkaline Phosphatase: 51 U/L (ref 38–126)
BILIRUBIN TOTAL: 0.4 mg/dL (ref 0.3–1.2)
BUN: 9 mg/dL (ref 6–20)
CHLORIDE: 109 mmol/L (ref 101–111)
CO2: 22 mmol/L (ref 22–32)
Calcium: 9 mg/dL (ref 8.9–10.3)
Creatinine, Ser: 0.84 mg/dL (ref 0.44–1.00)
Glucose, Bld: 153 mg/dL — ABNORMAL HIGH (ref 65–99)
POTASSIUM: 3.9 mmol/L (ref 3.5–5.1)
Sodium: 138 mmol/L (ref 135–145)
Total Protein: 7 g/dL (ref 6.5–8.1)

## 2015-06-30 LAB — MAGNESIUM: MAGNESIUM: 2.1 mg/dL (ref 1.7–2.4)

## 2015-06-30 MED ORDER — SODIUM CHLORIDE 0.9 % IJ SOLN
10.0000 mL | INTRAMUSCULAR | Status: DC | PRN
  Administered 2015-06-30: 10 mL
  Filled 2015-06-30: qty 10

## 2015-06-30 MED ORDER — FAMOTIDINE IN NACL 20-0.9 MG/50ML-% IV SOLN
20.0000 mg | Freq: Once | INTRAVENOUS | Status: AC
  Administered 2015-06-30: 20 mg via INTRAVENOUS
  Filled 2015-06-30: qty 50

## 2015-06-30 MED ORDER — DIPHENHYDRAMINE HCL 50 MG/ML IJ SOLN
50.0000 mg | Freq: Once | INTRAMUSCULAR | Status: AC
  Administered 2015-06-30: 50 mg via INTRAVENOUS
  Filled 2015-06-30: qty 1

## 2015-06-30 MED ORDER — PACLITAXEL CHEMO INJECTION 300 MG/50ML
90.0000 mg/m2 | Freq: Once | INTRAVENOUS | Status: AC
  Administered 2015-06-30: 240 mg via INTRAVENOUS
  Filled 2015-06-30: qty 40

## 2015-06-30 MED ORDER — HEPARIN SOD (PORK) LOCK FLUSH 100 UNIT/ML IV SOLN
500.0000 [IU] | Freq: Once | INTRAVENOUS | Status: AC | PRN
  Administered 2015-06-30: 500 [IU]
  Filled 2015-06-30: qty 5

## 2015-06-30 MED ORDER — SODIUM CHLORIDE 0.9 % IV SOLN
Freq: Once | INTRAVENOUS | Status: AC
  Administered 2015-06-30: 11:00:00 via INTRAVENOUS
  Filled 2015-06-30: qty 4

## 2015-06-30 MED ORDER — SODIUM CHLORIDE 0.9 % IV SOLN
Freq: Once | INTRAVENOUS | Status: AC
  Administered 2015-06-30: 11:00:00 via INTRAVENOUS
  Filled 2015-06-30: qty 1000

## 2015-06-30 NOTE — Progress Notes (Signed)
06/30/2015 @ 09:35am  Patient returns today for consideration of cycle 6 Day 1 Taxol infusion per ACCRU [id number]. Patient reports she is experiencing a tingling sensation in tips of fingers and toes with left side greater than right. She reports no problems holding things. Dr. Oliva Bustard will continue to monitor at this time, if symptoms worsen with treatment today patient will contact us.  Dr. Oliva Bustard has assessed the patient including her right outer breast cutaneous lesion.  The lesion measures 9cm X 4cm today. Dr. Oliva Bustard requested we find out from the wound center what type of dressing she is using and order them through our office.  Patient completed the PRO questionaire required for cycle 6. Patient's labs were within acceptable parameters to proceed with Taxol infusion. Reviewed phone PRO-CTCAE survey with patient. Patient reports symptoms are essentially unchanged with one exception related to tingling sensation in tips of fingers and toes which is a grade 1. Patient will return in one week for labs and Taxol infusion. We will schedule appointments for CT and Bone scan and call her with the date and time. AE's and attributions listed below:                                       Alopecia - grade 2; definitely attributed to Taxol Lymphedema - grade 2; unrelated (baseline) Fatigue - grade 1; definitely related  Neuropathy- grade 1; definitely related

## 2015-07-01 ENCOUNTER — Telehealth: Payer: Self-pay

## 2015-07-01 ENCOUNTER — Encounter: Payer: Self-pay | Admitting: Oncology

## 2015-07-01 NOTE — Telephone Encounter (Signed)
07/01/2015 @ 11:05am  Discussed with patient during visit with Dr. Oliva Bustard on 06/30/2015. It was agreed upon by patient and Dr. Oliva Bustard that we would order bandages for the patient through the [facility]. P/C to patient to inquire about ordering bandaging supplies for wound care. Informed patient we will order bandages through the cancer center to be delivered to her home. She currently uses [facility] through [employer]. Received by fax, ordering information from the [facility] in [place] and shared with Dr. Metro Kung nurse, Angie Fava.

## 2015-07-01 NOTE — Progress Notes (Signed)
[place]  [phone]  [phone]     [name] DOB: 06/10/1956  MR#: [id number]  XBL#:[id number]  Patient Care Team: Kirk Ruths, MD as PCP - General (Internal Medicine)  CHIEF COMPLAINT:  Chief Complaint  Patient presents with  . Breast Cancer    This 60 y.o. female patient presents to the clinic for follow-up metastatic breast cancer.   Chief Complaint/Problem List: 1. Breast cancer see also 07/08/13 note, initially a T4 N2 M1 ER positive HER-2 negative stage IV disease 2. Anemia iron deficiency attributed to prior bleeding from wound C earlier documents 3. Right arm lymphedema prior cellulitis 4. Right breast nonhealing but stable wound 5. Past history of hypertension hyperlipidemia coronary disease stenting      ONCOLOGY HISTORY: Oncology History   1. 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 Previously noted long-term bleeding from the wound of right breast associated with cancer. 2. Patient has extensive right arm lymphedema. Patient is limited in range of motion due to lymphedema and pain in right arm. 3. Patient was started on Xgeva and letrozole in January 2015, [name] was started in April 2015. 4. CEA at lowest 7.4, failure of Ibrance and letrozole with steady rise in CEA. Most recent CEA in May 2016 reported as 223.    6.TX XGEVA FIRST [place] 06/2013 LETRAZOLE 07/12/13  IBRANCE 10/10/13. ibrance 12/27/13..cea lowest at 7.4, but failure of ibrance and letrozole as steady rise since      Stage IV breast cancer with metastases to bone and adrenal gland.his and has been started on Taxol weekly under the protocol  INTERVAL HISTORY:  60 year old lady came today further follow-up regarding stage IV carcinoma of breast.  Since last evaluation He should not is continuing chemotherapy as per protocol.  Right breast  wound is gradually healing.  Patient complains of some numbness and deep of the fingers and keep of the toes.  But not interfering with any work.   ROS GENERAL:  Feels good.  Active.  No fevers, sweats or weight loss. PERFORMANCE STATUS (ECOG):  0 HEENT:  No visual changes, runny nose, sore throat, mouth sores or tenderness.epistaxis which is resolved Lungs: No shortness of breath or cough.  No hemoptysis. Cardiac:  No chest pain, palpitations, orthopnea, or PND. GI:  No nausea, vomiting, diarrhea, constipation, melena or hematochezia. GU:  No urgency, frequency, dysuria, hematuria has resolvedMusculoskeletal:  No back pain.  No joint pain.  No muscle tenderness. Extremities:  Right upper extremity swelling Skin:  No rashes or skin changes. Neuro: numbness on the tip of the finger and tape of the toes not interfering with any average daily life. Endocrine:  No diabetes, thyroid issues, hot flashes or night sweats. Psych:  No mood changes, depression or anxiety. Pain:  No focal pain. Review of systems:  All other systems reviewed and found to be negative. Breast wound is slowly healing.  Patient is attending wound care center once a month   PAST MEDICAL HISTORY: Past Medical History  Diagnosis Date  . Hypertension   . Myocardial infarction ([place])   . Anginal pain ([place])   . Sleep apnea   . Anemia   . Post-menopausal 2006  . Breast cancer ([place])   . Breast cancer metastasized to bone ([place]) 11/21/2014    PAST SURGICAL HISTORY: Past Surgical History  Procedure Laterality Date  . Coronary angioplasty  with stent placement N/A   . Hernia repair      umbilical hernia repair  . Portacath placement Right 02/03/2015    Procedure: INSERTION PORT-A-CATH;  Surgeon: [name], MD;  Location: [facility] ORS;  Service: General;  Laterality: Right;    FAMILY HISTORY There is no significant family history of breast cancer, ovarian cancer, colon cancer    ADVANCED DIRECTIVES:  Patient does  not have any living will or healthcare power of attorney.  Information was given .  Available resources had been discussed.  We will follow-up on subsequent appointments regarding this issue  HEALTH MAINTENANCE: Social History  Substance Use Topics  . Smoking status: Former Smoker -- 1.00 packs/day    Types: Cigarettes    Quit date: 10/25/2004  . Smokeless tobacco: Never Used     Comment: stopped smoking greater than 7 years ago  . Alcohol Use: No       Allergies  Allergen Reactions  . No Known Allergies     Current Outpatient Prescriptions  Medication Sig Dispense Refill  . acetaminophen (TYLENOL) 500 MG tablet Take 1,000 mg by mouth every 6 (six) hours as needed.    Marland Kitchen aspirin EC 81 MG tablet Take 81 mg by mouth daily.    . clopidogrel (PLAVIX) 75 MG tablet Take 75 mg by mouth daily.    . [employer] CALCIUM 600+D 600-800 MG-UNIT TABS Take 1 tablet by mouth daily. At noon  3  . fexofenadine (ALLEGRA) 180 MG tablet Take 180 mg by mouth at bedtime.    . furosemide (LASIX) 20 MG tablet Take 20 mg by mouth daily.  1  . lidocaine-prilocaine (EMLA) cream Apply 1 application topically as needed. 30 g 3  . lisinopril (PRINIVIL,ZESTRIL) 20 MG tablet Take 20 mg by mouth daily.    . metoprolol succinate (TOPROL-XL) 50 MG 24 hr tablet Take 50 mg by mouth 2 (two) times daily. Take with or immediately following a meal.    . omega-3 acid ethyl esters (LOVAZA) 1 G capsule Take 1 g by mouth at bedtime.    . ondansetron (ZOFRAN) 8 MG tablet Take 1 tablet (8 mg total) by mouth 2 (two) times daily. Start the day after chemo for 2 days. Then take as needed for nausea or vomiting. 30 tablet 1  . phosphorus (PHOSPHA 250 NEUTRAL) 155-852-130 MG tablet Take 1 tablet (250 mg total) by mouth 2 (two) times daily. 60 tablet 6  . potassium chloride (K-DUR) 10 MEQ tablet Take 10 mEq by mouth daily.   1  . simvastatin (ZOCOR) 80 MG tablet Take 80 mg by mouth at bedtime.    . [DISCONTINUED] rivaroxaban (XARELTO) 20 MG  TABS tablet Take 1 tablet (20 mg total) by mouth daily with supper. To start once finishes Xarelto 39m prescription. 30 tablet 3   No current facility-administered medications for this visit.    OBJECTIVE: BP 151/86 mmHg  Pulse 98  Temp(Src) 98.8 F (37.1 C) (Tympanic)  Wt 345 lb 10.9 oz (156.8 kg)   Body mass index is 59.31 kg/(m^2).    ECOG FS:0 - Asymptomatic  General: Well-developed, well-nourished, no acute distress. Eyes: Pink conjunctiva, anicteric sclera. HEENT: Normocephalic, moist mucous membranes, clear oropharnyx. Lungs: Clear to auscultation bilaterally. Heart: Regular rate and rhythm. No rubs, murmurs, or gallops. Abdomen: Soft, nontender, nondistended. No organomegaly noted, normoactive bowel sounds. Musculoskeletal: No edema, cyanosis, or clubbing. Neuro: Alert, answering all questions appropriately. Cranial nerves grossly intact. Skin: No rashes or petechiae noted. Psych: Normal  affect. Lymphatics: No cervical, calvicular, axillary or inguinal LAD.  Right upper extremity lymphedema is gradually getting better.  Is grade 3 lymphedema. Right breast: In duration persist however open wound is gradually healing.  There are multiple lady is a bleeding.  Right breast mass is gradually decreasing.  Wound size is now 9 x 4.5 cm and is very superficial LAB RESULTS:                          Infusion on 06/30/2015  Component Date Value Ref Range Status  . WBC 06/30/2015 6.6  3.6 - 11.0 K/uL Final  . RBC 06/30/2015 3.98  3.80 - 5.20 MIL/uL Final  . Hemoglobin 06/30/2015 11.0* 12.0 - 16.0 g/dL Final  . HCT 06/30/2015 34.8* 35.0 - 47.0 % Final  . MCV 06/30/2015 87.5  80.0 - 100.0 fL Final  . MCH 06/30/2015 27.7  26.0 - 34.0 pg Final  . MCHC 06/30/2015 31.7* 32.0 - 36.0 g/dL Final  . RDW 06/30/2015 22.9* 11.5 - 14.5 % Final  . Platelets 06/30/2015 292  150 - 440 K/uL Final  . Neutrophils Relative % 06/30/2015 65   Final  . Neutro Abs 06/30/2015 4.3  1.4 - 6.5 K/uL  Final  . Lymphocytes Relative 06/30/2015 18   Final  . Lymphs Abs 06/30/2015 1.2  1.0 - 3.6 K/uL Final  . Monocytes Relative 06/30/2015 11   Final  . Monocytes Absolute 06/30/2015 0.7  0.2 - 0.9 K/uL Final  . Eosinophils Relative 06/30/2015 5   Final  . Eosinophils Absolute 06/30/2015 0.3  0 - 0.7 K/uL Final  . Basophils Relative 06/30/2015 1   Final  . Basophils Absolute 06/30/2015 0.1  0 - 0.1 K/uL Final  . Sodium 06/30/2015 138  135 - 145 mmol/L Final  . Potassium 06/30/2015 3.9  3.5 - 5.1 mmol/L Final  . Chloride 06/30/2015 109  101 - 111 mmol/L Final  . CO2 06/30/2015 22  22 - 32 mmol/L Final  . Glucose, Bld 06/30/2015 153* 65 - 99 mg/dL Final  . BUN 06/30/2015 9  6 - 20 mg/dL Final  . Creatinine, Ser 06/30/2015 0.84  0.44 - 1.00 mg/dL Final  . Calcium 06/30/2015 9.0  8.9 - 10.3 mg/dL Final  . Total Protein 06/30/2015 7.0  6.5 - 8.1 g/dL Final  . Albumin 06/30/2015 3.5  3.5 - 5.0 g/dL Final  . AST 06/30/2015 18  15 - 41 U/L Final  . ALT 06/30/2015 15  14 - 54 U/L Final  . Alkaline Phosphatase 06/30/2015 51  38 - 126 U/L Final  . Total Bilirubin 06/30/2015 0.4  0.3 - 1.2 mg/dL Final  . GFR calc non Af Amer 06/30/2015 >60  >60 mL/min Final  . GFR calc Af Amer 06/30/2015 >60  >60 mL/min Final   Comment: (NOTE) The eGFR has been calculated using the CKD EPI equation. This calculation has not been validated in all clinical situations. eGFR's persistently <60 mL/min signify possible Chronic Kidney Disease.   . Anion gap 06/30/2015 7  5 - 15 Final  . Magnesium 06/30/2015 2.1  1.7 - 2.4 mg/dL Final  CLINICAL DATA: 60 year old female with history of right-sided  breast cancer diagnosed in December 2014, currently undergoing  chemotherapy.  EXAM:  CT CHEST, ABDOMEN, AND PELVIS WITH CONTRAST  TECHNIQUE:  Multidetector CT imaging of the chest, abdomen and pelvis was  performed following the standard protocol during bolus  administration of intravenous contrast.  CONTRAST: 140m  OMNIPAQUE IOHEXOL 350 MG/ML SOLN  COMPARISON: Multiple priors, most recently CT of the chest, abdomen  and pelvis 02/05/2015.  FINDINGS:  RECIST 1.1  Target Lesions:  1. Primary right breast neoplasm significantly decreased in size,  currently measuring 4.8 cm (previously 8.3 cm).  2. Right axillary nodal conglomerate 4.7 cm short axis (previously  7.4 cm).  3. Right shoulder soft tissue metastasis (posterior to right  infraspinatus muscle) decreased to 2.6 cm (previously 4.3 cm).  4. Left adrenal metastasis 2.8 cm (previously 5.2 cm).  5. Left inguinal lymph node 9 mm (previously 2.9 cm).  Non-target Lesions: .  IMPRESSION: 1. Today's study demonstrates a positive response to therapy, with marked regression of the right breast mass, decreased size of numerous soft tissue metastases in and around the right shoulder region, decreased size of left adrenal metastasis, and marked decrease of lymphadenopathy noted throughout the chest, abdomen and pelvis, as detailed above. 2. No new metastatic lesions are identified in the chest, abdomen or pelvis. 3. 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 4. Additional incidental findings, as above. These results will be called to the ordering clinician or representative by the Radiologist Assistant, and communication documented in the PACS or zVision Dashboard    ASSESSMENT:  Stage IV carcinoma of breast On weekly Taxol. By CT scan criteria patient is responding to the treatment.  Repeat CT scan has been scheduled 1/23 January All lab data has been reviewed Wound is slowly healing. Side effect of chemotherapy Grade 1 neuropathy Continue management of wound is has been discussed with the research Nurses.  Protocol has been reviewed. Continue chemotherapy as per protocol Discussion with research nurses measurement of tumor Review of wound Discussion regarding management of wound  care.  At present time patient may not go to wound care clinic unless needed will continue dressing as prescribed by wound care clinic. All the notes from old Clinic has been reviewed   seling patient and family regarding prognosis and options of treatment and available resources. Reviewing scan Assessment    Of  tumor And management of new onset of thrombus Forest Gleason, MD   07/01/2015 8:12 AM

## 2015-07-02 ENCOUNTER — Encounter: Payer: Self-pay | Admitting: *Deleted

## 2015-07-02 NOTE — Progress Notes (Signed)
Order for wound dressings submitted via fax to [facility].

## 2015-07-07 ENCOUNTER — Ambulatory Visit: Payer: BC Managed Care – PPO | Admitting: Oncology

## 2015-07-07 ENCOUNTER — Inpatient Hospital Stay: Payer: BC Managed Care – PPO

## 2015-07-07 VITALS — BP 137/74 | HR 99 | Temp 98.1°F

## 2015-07-07 DIAGNOSIS — C50911 Malignant neoplasm of unspecified site of right female breast: Secondary | ICD-10-CM

## 2015-07-07 DIAGNOSIS — C7951 Secondary malignant neoplasm of bone: Principal | ICD-10-CM

## 2015-07-07 DIAGNOSIS — Z5111 Encounter for antineoplastic chemotherapy: Secondary | ICD-10-CM | POA: Diagnosis not present

## 2015-07-07 LAB — COMPREHENSIVE METABOLIC PANEL
ALT: 15 U/L (ref 14–54)
ANION GAP: 7 (ref 5–15)
AST: 17 U/L (ref 15–41)
Albumin: 3.3 g/dL — ABNORMAL LOW (ref 3.5–5.0)
Alkaline Phosphatase: 52 U/L (ref 38–126)
BUN: 12 mg/dL (ref 6–20)
CHLORIDE: 104 mmol/L (ref 101–111)
CO2: 23 mmol/L (ref 22–32)
CREATININE: 0.71 mg/dL (ref 0.44–1.00)
Calcium: 8.7 mg/dL — ABNORMAL LOW (ref 8.9–10.3)
Glucose, Bld: 151 mg/dL — ABNORMAL HIGH (ref 65–99)
Potassium: 3.8 mmol/L (ref 3.5–5.1)
Sodium: 134 mmol/L — ABNORMAL LOW (ref 135–145)
Total Bilirubin: 0.4 mg/dL (ref 0.3–1.2)
Total Protein: 7 g/dL (ref 6.5–8.1)

## 2015-07-07 LAB — CBC WITH DIFFERENTIAL/PLATELET
BASOS ABS: 0.1 10*3/uL (ref 0–0.1)
Basophils Relative: 1 %
Eosinophils Absolute: 0.4 10*3/uL (ref 0–0.7)
Eosinophils Relative: 5 %
HEMATOCRIT: 34.8 % — AB (ref 35.0–47.0)
Hemoglobin: 11.3 g/dL — ABNORMAL LOW (ref 12.0–16.0)
LYMPHS ABS: 1.4 10*3/uL (ref 1.0–3.6)
LYMPHS PCT: 19 %
MCH: 27.9 pg (ref 26.0–34.0)
MCHC: 32.4 g/dL (ref 32.0–36.0)
MCV: 86.2 fL (ref 80.0–100.0)
MONO ABS: 0.4 10*3/uL (ref 0.2–0.9)
Monocytes Relative: 5 %
NEUTROS ABS: 5 10*3/uL (ref 1.4–6.5)
Neutrophils Relative %: 70 %
Platelets: 319 10*3/uL (ref 150–440)
RBC: 4.04 MIL/uL (ref 3.80–5.20)
RDW: 23.3 % — AB (ref 11.5–14.5)
WBC: 7.2 10*3/uL (ref 3.6–11.0)

## 2015-07-07 MED ORDER — SODIUM CHLORIDE 0.9 % IV SOLN
Freq: Once | INTRAVENOUS | Status: AC
  Administered 2015-07-07: 10:00:00 via INTRAVENOUS
  Filled 2015-07-07: qty 4

## 2015-07-07 MED ORDER — DIPHENHYDRAMINE HCL 50 MG/ML IJ SOLN
50.0000 mg | Freq: Once | INTRAMUSCULAR | Status: AC
  Administered 2015-07-07: 50 mg via INTRAVENOUS
  Filled 2015-07-07: qty 1

## 2015-07-07 MED ORDER — HEPARIN SOD (PORK) LOCK FLUSH 100 UNIT/ML IV SOLN
500.0000 [IU] | Freq: Once | INTRAVENOUS | Status: AC | PRN
  Administered 2015-07-07: 500 [IU]
  Filled 2015-07-07: qty 5

## 2015-07-07 MED ORDER — FAMOTIDINE IN NACL 20-0.9 MG/50ML-% IV SOLN
20.0000 mg | Freq: Once | INTRAVENOUS | Status: AC
  Administered 2015-07-07: 20 mg via INTRAVENOUS
  Filled 2015-07-07: qty 50

## 2015-07-07 MED ORDER — SODIUM CHLORIDE 0.9 % IV SOLN
Freq: Once | INTRAVENOUS | Status: AC
  Administered 2015-07-07: 10:00:00 via INTRAVENOUS
  Filled 2015-07-07: qty 1000

## 2015-07-07 MED ORDER — DEXTROSE 5 % IV SOLN
90.0000 mg/m2 | Freq: Once | INTRAVENOUS | Status: AC
  Administered 2015-07-07: 240 mg via INTRAVENOUS
  Filled 2015-07-07: qty 40

## 2015-07-07 MED ORDER — SODIUM CHLORIDE 0.9 % IJ SOLN
10.0000 mL | INTRAMUSCULAR | Status: AC | PRN
  Administered 2015-07-07: 10 mL
  Filled 2015-07-07: qty 10

## 2015-07-07 NOTE — Progress Notes (Signed)
07/07/2015  @ 9:45am  Patient returns to today for consideration of cycle 6 Day 8 Taxol infusion. Patient reports she continues to have some intermittent tingling sensation in both hands but with improvement since last visit on 06/30/2015. Reviewed phone CTCAE survey with patient, no changes noted. CBC and chemistries reviewed with Georgeanne Nim. Vital signs all within acceptable parameters for treatment. Will proceed with cycle 6 Day 8 Taxol infusion as scheduled. Gave CT and Bone Scan appointments for 07/21/2015 to patient. AE's with grade and attribution as follows:                                      Alopecia - grade 2; definitely attributed to Taxol Lymphedema - grade 2; unrelated (baseline) Fatigue - grade 1; definitely related  Neuropathy- grade 1; definitely related

## 2015-07-14 ENCOUNTER — Inpatient Hospital Stay: Payer: BC Managed Care – PPO

## 2015-07-14 DIAGNOSIS — C7951 Secondary malignant neoplasm of bone: Principal | ICD-10-CM

## 2015-07-14 DIAGNOSIS — Z5111 Encounter for antineoplastic chemotherapy: Secondary | ICD-10-CM | POA: Diagnosis not present

## 2015-07-14 DIAGNOSIS — C50911 Malignant neoplasm of unspecified site of right female breast: Secondary | ICD-10-CM

## 2015-07-14 LAB — CBC WITH DIFFERENTIAL/PLATELET
BASOS PCT: 2 %
Basophils Absolute: 0.1 10*3/uL (ref 0–0.1)
EOS ABS: 0.3 10*3/uL (ref 0–0.7)
EOS PCT: 6 %
HEMATOCRIT: 35 % (ref 35.0–47.0)
Hemoglobin: 11.3 g/dL — ABNORMAL LOW (ref 12.0–16.0)
Lymphocytes Relative: 30 %
Lymphs Abs: 1.3 10*3/uL (ref 1.0–3.6)
MCH: 27.6 pg (ref 26.0–34.0)
MCHC: 32.2 g/dL (ref 32.0–36.0)
MCV: 85.9 fL (ref 80.0–100.0)
MONO ABS: 0.3 10*3/uL (ref 0.2–0.9)
MONOS PCT: 6 %
NEUTROS ABS: 2.5 10*3/uL (ref 1.4–6.5)
Neutrophils Relative %: 56 %
PLATELETS: 292 10*3/uL (ref 150–440)
RBC: 4.07 MIL/uL (ref 3.80–5.20)
RDW: 23.2 % — AB (ref 11.5–14.5)
WBC: 4.5 10*3/uL (ref 3.6–11.0)

## 2015-07-14 LAB — COMPREHENSIVE METABOLIC PANEL
ALK PHOS: 52 U/L (ref 38–126)
ALT: 15 U/L (ref 14–54)
AST: 17 U/L (ref 15–41)
Albumin: 3.4 g/dL — ABNORMAL LOW (ref 3.5–5.0)
Anion gap: 8 (ref 5–15)
BUN: 16 mg/dL (ref 6–20)
CALCIUM: 9 mg/dL (ref 8.9–10.3)
CHLORIDE: 106 mmol/L (ref 101–111)
CO2: 21 mmol/L — ABNORMAL LOW (ref 22–32)
CREATININE: 0.71 mg/dL (ref 0.44–1.00)
GFR calc non Af Amer: >60 mL/min (ref >60)
Glucose, Bld: 148 mg/dL — ABNORMAL HIGH (ref 65–99)
Potassium: 3.9 mmol/L (ref 3.5–5.1)
SODIUM: 135 mmol/L (ref 135–145)
TOTAL PROTEIN: 7 g/dL (ref 6.5–8.1)
Total Bilirubin: 0.5 mg/dL (ref 0.3–1.2)

## 2015-07-14 MED ORDER — PACLITAXEL CHEMO INJECTION 300 MG/50ML
90.0000 mg/m2 | Freq: Once | INTRAVENOUS | Status: AC
  Administered 2015-07-14: 240 mg via INTRAVENOUS
  Filled 2015-07-14: qty 40

## 2015-07-14 MED ORDER — SODIUM CHLORIDE 0.9 % IV SOLN
Freq: Once | INTRAVENOUS | Status: AC
  Administered 2015-07-14: 12:00:00 via INTRAVENOUS
  Filled 2015-07-14: qty 4

## 2015-07-14 MED ORDER — FAMOTIDINE IN NACL 20-0.9 MG/50ML-% IV SOLN
20.0000 mg | Freq: Once | INTRAVENOUS | Status: AC
Start: 2015-07-14 — End: 2015-07-14
  Administered 2015-07-14: 20 mg via INTRAVENOUS
  Filled 2015-07-14: qty 50

## 2015-07-14 MED ORDER — HEPARIN SOD (PORK) LOCK FLUSH 100 UNIT/ML IV SOLN
500.0000 [IU] | Freq: Once | INTRAVENOUS | Status: AC | PRN
  Administered 2015-07-14: 500 [IU]
  Filled 2015-07-14: qty 5

## 2015-07-14 MED ORDER — DIPHENHYDRAMINE HCL 50 MG/ML IJ SOLN
50.0000 mg | Freq: Once | INTRAMUSCULAR | Status: AC
  Administered 2015-07-14: 50 mg via INTRAVENOUS
  Filled 2015-07-14: qty 1

## 2015-07-14 MED ORDER — SODIUM CHLORIDE 0.9 % IV SOLN
Freq: Once | INTRAVENOUS | Status: AC
  Administered 2015-07-14: 12:00:00 via INTRAVENOUS
  Filled 2015-07-14: qty 1000

## 2015-07-14 NOTE — Progress Notes (Signed)
07/14/2015 @ 10:35am  Patient returns today for consideration of Cycle 6 Day 15 Taxol infusion per ACCRU [id number]. Patient reports she continues to experience a "funny feeling" in fingertips and toes. She reports that there is no longer a tingling sensation but that it just "feels funny".  I reviewed PRO- CTCAE phone survey with patient. She continues to report some fatigue which remains a grade 1. CBC and chemistries are within acceptable parameters to proceed with Taxol infusion as scheduled. The patient is scheduled for a Bone Scan and CT scan on 07/21/2015. She will return to clinic on 07/28/2015 for labs, see MD, review scans, Pro Booklet completion and Taxol infusion. Current AE's with attribution included below:                                          Lymphedema - grade 2; unrelated (baseline) Fatigue - grade 1; definitely related  Neuropathy- grade 1; definitely related

## 2015-07-21 ENCOUNTER — Other Ambulatory Visit: Payer: Self-pay | Admitting: *Deleted

## 2015-07-21 ENCOUNTER — Encounter: Admission: RE | Admit: 2015-07-21 | Payer: BC Managed Care – PPO | Source: Ambulatory Visit

## 2015-07-21 ENCOUNTER — Ambulatory Visit: Payer: BC Managed Care – PPO

## 2015-07-21 DIAGNOSIS — C50911 Malignant neoplasm of unspecified site of right female breast: Secondary | ICD-10-CM

## 2015-07-21 DIAGNOSIS — C7951 Secondary malignant neoplasm of bone: Principal | ICD-10-CM

## 2015-07-24 ENCOUNTER — Encounter
Admission: RE | Admit: 2015-07-24 | Discharge: 2015-07-24 | Disposition: A | Discharge status: Home / Self Care | Payer: BC Managed Care – PPO | Source: Ambulatory Visit | Attending: Oncology | Admitting: Oncology

## 2015-07-24 ENCOUNTER — Ambulatory Visit: Payer: BC Managed Care – PPO

## 2015-07-24 ENCOUNTER — Ambulatory Visit
Admission: RE | Admit: 2015-07-24 | Discharge: 2015-07-24 | Disposition: A | Discharge status: Home / Self Care | Payer: BC Managed Care – PPO | Source: Ambulatory Visit | Attending: Oncology | Admitting: Oncology

## 2015-07-24 DIAGNOSIS — I700 Atherosclerosis of aorta: Secondary | ICD-10-CM | POA: Diagnosis not present

## 2015-07-24 DIAGNOSIS — C7951 Secondary malignant neoplasm of bone: Secondary | ICD-10-CM | POA: Insufficient documentation

## 2015-07-24 DIAGNOSIS — E278 Other specified disorders of adrenal gland: Secondary | ICD-10-CM | POA: Insufficient documentation

## 2015-07-24 DIAGNOSIS — C50911 Malignant neoplasm of unspecified site of right female breast: Secondary | ICD-10-CM | POA: Diagnosis not present

## 2015-07-24 MED ORDER — IOHEXOL 300 MG/ML  SOLN
125.0000 mL | Freq: Once | INTRAMUSCULAR | Status: AC | PRN
  Administered 2015-07-24: 125 mL via INTRAVENOUS

## 2015-07-24 MED ORDER — TECHNETIUM TC 99M MEDRONATE IV KIT
22.7000 | PACK | Freq: Once | INTRAVENOUS | Status: AC | PRN
  Administered 2015-07-24: 22.7 via INTRAVENOUS

## 2015-07-28 ENCOUNTER — Inpatient Hospital Stay (HOSPITAL_BASED_OUTPATIENT_CLINIC_OR_DEPARTMENT_OTHER): Payer: BC Managed Care – PPO | Admitting: Oncology

## 2015-07-28 ENCOUNTER — Inpatient Hospital Stay: Payer: BC Managed Care – PPO

## 2015-07-28 ENCOUNTER — Encounter: Payer: Self-pay | Admitting: Oncology

## 2015-07-28 VITALS — BP 138/88 | HR 112 | Temp 98.3°F | Resp 18 | Wt 328.5 lb

## 2015-07-28 DIAGNOSIS — T451X5D Adverse effect of antineoplastic and immunosuppressive drugs, subsequent encounter: Secondary | ICD-10-CM

## 2015-07-28 DIAGNOSIS — C7982 Secondary malignant neoplasm of genital organs: Secondary | ICD-10-CM

## 2015-07-28 DIAGNOSIS — C778 Secondary and unspecified malignant neoplasm of lymph nodes of multiple regions: Secondary | ICD-10-CM

## 2015-07-28 DIAGNOSIS — C7989 Secondary malignant neoplasm of other specified sites: Secondary | ICD-10-CM | POA: Diagnosis not present

## 2015-07-28 DIAGNOSIS — C7951 Secondary malignant neoplasm of bone: Principal | ICD-10-CM

## 2015-07-28 DIAGNOSIS — Z006 Encounter for examination for normal comparison and control in clinical research program: Secondary | ICD-10-CM | POA: Diagnosis not present

## 2015-07-28 DIAGNOSIS — C50911 Malignant neoplasm of unspecified site of right female breast: Secondary | ICD-10-CM | POA: Diagnosis not present

## 2015-07-28 DIAGNOSIS — Z170 Estrogen receptor positive status [ER+]: Secondary | ICD-10-CM

## 2015-07-28 DIAGNOSIS — Z5111 Encounter for antineoplastic chemotherapy: Secondary | ICD-10-CM | POA: Diagnosis not present

## 2015-07-28 DIAGNOSIS — G620 Drug-induced polyneuropathy: Secondary | ICD-10-CM

## 2015-07-28 LAB — COMPREHENSIVE METABOLIC PANEL
ALK PHOS: 55 U/L (ref 38–126)
ALT: 10 U/L — AB (ref 14–54)
AST: 13 U/L — AB (ref 15–41)
Albumin: 3 g/dL — ABNORMAL LOW (ref 3.5–5.0)
Anion gap: 10 (ref 5–15)
BILIRUBIN TOTAL: 0.6 mg/dL (ref 0.3–1.2)
BUN: 7 mg/dL (ref 6–20)
CALCIUM: 8.6 mg/dL — AB (ref 8.9–10.3)
CHLORIDE: 104 mmol/L (ref 101–111)
CO2: 23 mmol/L (ref 22–32)
CREATININE: 0.67 mg/dL (ref 0.44–1.00)
Glucose, Bld: 147 mg/dL — ABNORMAL HIGH (ref 65–99)
Potassium: 3.5 mmol/L (ref 3.5–5.1)
Sodium: 137 mmol/L (ref 135–145)
Total Protein: 6.9 g/dL (ref 6.5–8.1)

## 2015-07-28 LAB — CBC WITH DIFFERENTIAL/PLATELET
BASOS ABS: 0.1 10*3/uL (ref 0–0.1)
Basophils Relative: 1 %
EOS PCT: 3 %
Eosinophils Absolute: 0.3 10*3/uL (ref 0–0.7)
HEMATOCRIT: 33.6 % — AB (ref 35.0–47.0)
HEMOGLOBIN: 11 g/dL — AB (ref 12.0–16.0)
LYMPHS ABS: 1.2 10*3/uL (ref 1.0–3.6)
Lymphocytes Relative: 12 %
MCH: 27.6 pg (ref 26.0–34.0)
MCHC: 32.8 g/dL (ref 32.0–36.0)
MCV: 84.1 fL (ref 80.0–100.0)
MONOS PCT: 9 %
Monocytes Absolute: 0.9 10*3/uL (ref 0.2–0.9)
NEUTROS ABS: 7.4 10*3/uL — AB (ref 1.4–6.5)
NEUTROS PCT: 75 %
PLATELETS: 312 10*3/uL (ref 150–440)
RBC: 4 MIL/uL (ref 3.80–5.20)
RDW: 23.1 % — ABNORMAL HIGH (ref 11.5–14.5)
WBC: 9.9 10*3/uL (ref 3.6–11.0)

## 2015-07-28 MED ORDER — FAMOTIDINE IN NACL 20-0.9 MG/50ML-% IV SOLN
20.0000 mg | Freq: Once | INTRAVENOUS | Status: AC
  Administered 2015-07-28: 20 mg via INTRAVENOUS
  Filled 2015-07-28: qty 50

## 2015-07-28 MED ORDER — PACLITAXEL CHEMO INJECTION 300 MG/50ML
90.0000 mg/m2 | Freq: Once | INTRAVENOUS | Status: AC
  Administered 2015-07-28: 240 mg via INTRAVENOUS
  Filled 2015-07-28: qty 40

## 2015-07-28 MED ORDER — HEPARIN SOD (PORK) LOCK FLUSH 100 UNIT/ML IV SOLN
500.0000 [IU] | Freq: Once | INTRAVENOUS | Status: AC
  Administered 2015-07-28: 500 [IU] via INTRAVENOUS
  Filled 2015-07-28: qty 5

## 2015-07-28 MED ORDER — SODIUM CHLORIDE 0.9 % IV SOLN
Freq: Once | INTRAVENOUS | Status: AC
  Administered 2015-07-28: 11:00:00 via INTRAVENOUS
  Filled 2015-07-28: qty 4

## 2015-07-28 MED ORDER — SODIUM CHLORIDE 0.9% FLUSH
10.0000 mL | Freq: Once | INTRAVENOUS | Status: AC
  Administered 2015-07-28: 10 mL via INTRAVENOUS
  Filled 2015-07-28: qty 10

## 2015-07-28 MED ORDER — AMOXICILLIN-POT CLAVULANATE 875-125 MG PO TABS: 1.0000 | ORAL_TABLET | Freq: Two times a day (BID) | ORAL | Status: DC

## 2015-07-28 MED ORDER — DIPHENHYDRAMINE HCL 50 MG/ML IJ SOLN
50.0000 mg | Freq: Once | INTRAMUSCULAR | Status: AC
  Administered 2015-07-28: 50 mg via INTRAVENOUS
  Filled 2015-07-28: qty 1

## 2015-07-28 MED ORDER — SODIUM CHLORIDE 0.9 % IV SOLN
Freq: Once | INTRAVENOUS | Status: AC
  Administered 2015-07-28: 11:00:00 via INTRAVENOUS
  Filled 2015-07-28: qty 1000

## 2015-07-28 NOTE — Progress Notes (Signed)
07/28/2015 @ 10:20am  Patient returns to clinic today for consideration of Cycle 7 Day 1 Taxol infusion per ACCRU [id number]. Patient reports she has had a deep cough and congestion since Friday. Dr. Oliva Bustard discussed the CT and bone scan results with the patient and due to left lobe inflammatory lesion which is likely infection, he made the decision to treat her with an antibiotic (Augmentin) and will repeat the CT scan in one month following antibiotic treatment for a lung infection. The patient agrees with this plan. Patient also reports she has some fatigue especially since she started getting sick on Friday. She reports her neuropathy is moving further down in her fingers and toes which continues to be a grade 1.  CBC and chemistries reviewed with Dr. Oliva Bustard and are within acceptable parameters to proceed with Taxol infusion as scheduled.  Weight is 149.007kg, down from 156.8kg on 06/30/2015. Dr. Oliva Bustard assessed the patient including her right outer breast cutaneous lesion. The lesion measures 5.2cm X 6.5cm today. Patient completed the PRO questionnaire required for cycle 7 and we reviewed the phone PRO-CTCA survey with the patient. Patient reports most symptoms are essentially unchanged with the exception of the cough and the neuropathy moving further down in her fingers and toes which continues to be a grade 1. Patient will return in one week for labs and Taxol infusion. She will begin Augmentin 875mg  for the cough and chest congestion and will get a repeat CT (chest only w/o contrast) on August 21, 2015 to follow up in 4 weeks. The patient asked to change her visit dates to Wednesdays so this will begin with cycle 8 day 1.   AE's and attributions listed below: Mirian Mo, RN, BSN 07/28/2015 1:24 PM          Alopecia - grade 2; definitely attributed to Taxol Lymphedema - grade 2; unrelated (baseline) Fatigue - grade 1; definitely  related  Neuropathy- grade 1; definitely related    Lung infection- grade 2; unrelated    Hypoalbuminemia- grade 1, unlikely    Weight loss- grade 1, unlikely

## 2015-07-28 NOTE — Progress Notes (Signed)
Patient has developed a cold.  States she has cough and SOB.  Using Mucinex DM and Tylenol.

## 2015-07-28 NOTE — Progress Notes (Signed)
[place]  [phone]  [phone]     [name] DOB: 07/25/1955  MR#: [id number]  PTW#:[id number]  Patient Care Team: Kirk Ruths, MD as PCP - General (Internal Medicine)  CHIEF COMPLAINT:  Chief Complaint  Patient presents with  . Breast Cancer    This 60 y.o. female patient presents to the clinic for follow-up metastatic breast cancer.   Chief Complaint/Problem List: 1. Breast cancer see also 07/08/13 note, initially a T4 N2 M1 ER positive HER-2 negative stage IV disease 2. Anemia iron deficiency attributed to prior bleeding from wound C earlier documents 3. Right arm lymphedema prior cellulitis 4. Right breast nonhealing but stable wound 5. Past history of hypertension hyperlipidemia coronary disease stenting      ONCOLOGY HISTORY: Oncology History   1. 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 Previously noted long-term bleeding from the wound of right breast associated with cancer. 2. Patient has extensive right arm lymphedema. Patient is limited in range of motion due to lymphedema and pain in right arm. 3. Patient was started on Xgeva and letrozole in January 2015, [name] was started in April 2015. 4. CEA at lowest 7.4, failure of Ibrance and letrozole with steady rise in CEA. Most recent CEA in May 2016 reported as 223.    6.[place] XGEVA FIRST [facility] 06/2013 LETRAZOLE 07/12/13  IBRANCE 10/10/13. ibrance 12/27/13..cea lowest at 7.4, but failure of ibrance and letrozole as steady rise since      Stage IV breast cancer with metastases to bone and adrenal gland.his and has been started on Taxol weekly under the protocol  INTERVAL HISTORY:  60 year old lady came today further follow-up regarding stage IV carcinoma of breast.  Since last evaluation He should not is continuing chemotherapy as per protocol.  Right breast  wound is gradually healing.  Patient complains of some numbness and deep of the fingers and keep of the toes.  But not interfering with any work. Patient is here for ongoing evaluation and treatment consideration patient's does have slight cough.  No chills.  Expectoration his white taste. The CT scan for restaging. Only pains.   ROS GENERAL:  Feels good.  Active.  No fevers, sweats or weight loss. PERFORMANCE STATUS (ECOG):  0 HEENT:  No visual changes, runny nose, sore throat, mouth sores or tenderness.epistaxis which is resolved Lungs: No shortness of breath or cough.  No hemoptysis. Cardiac:  No chest pain, palpitations, orthopnea, or PND. GI:  No nausea, vomiting, diarrhea, constipation, melena or hematochezia. GU:  No urgency, frequency, dysuria, hematuria has resolvedMusculoskeletal:  No back pain.  No joint pain.  No muscle tenderness. Extremities:  Right upper extremity swelling Skin:  No rashes or skin changes. Neuro: numbness on the tip of the finger and tape of the toes not interfering with any average daily life. Endocrine:  No diabetes, thyroid issues, hot flashes or night sweats. Psych:  No mood changes, depression or anxiety. Pain:  No focal pain. Review of systems:  All other systems reviewed and found to be negative. Breast wound is slowly healing.  Patient is attending wound care center once a month   PAST MEDICAL HISTORY: Past Medical History  Diagnosis Date  . Hypertension   . Myocardial infarction ([place])   . Anginal pain ([place])   . Sleep apnea   . Anemia   . Post-menopausal 2006  . Breast  cancer ([place])     2014   . Breast cancer metastasized to bone ([place]) 11/21/2014    PAST SURGICAL HISTORY: Past Surgical History  Procedure Laterality Date  . Coronary angioplasty with stent placement N/A   . Hernia repair      umbilical hernia repair  . Portacath placement Right 02/03/2015    Procedure: INSERTION PORT-A-CATH;  Surgeon: [name], MD;  Location:  [facility] ORS;  Service: General;  Laterality: Right;    FAMILY HISTORY There is no significant family history of breast cancer, ovarian cancer, colon cancer    ADVANCED DIRECTIVES:  Patient does not have any living will or healthcare power of attorney.  Information was given .  Available resources had been discussed.  We will follow-up on subsequent appointments regarding this issue  HEALTH MAINTENANCE: Social History  Substance Use Topics  . Smoking status: Former Smoker -- 1.00 packs/day    Types: Cigarettes    Quit date: 10/25/2004  . Smokeless tobacco: Never Used     Comment: stopped smoking greater than 7 years ago  . Alcohol Use: No       Allergies  Allergen Reactions  . No Known Allergies     Current Outpatient Prescriptions  Medication Sig Dispense Refill  . acetaminophen (TYLENOL) 500 MG tablet Take 1,000 mg by mouth every 6 (six) hours as needed.    Marland Kitchen aspirin EC 81 MG tablet Take 81 mg by mouth daily.    . clopidogrel (PLAVIX) 75 MG tablet Take 75 mg by mouth daily.    . [employer] CALCIUM 600+D 600-800 MG-UNIT TABS Take 1 tablet by mouth daily. At noon  3  . fexofenadine (ALLEGRA) 180 MG tablet Take 180 mg by mouth at bedtime.    . furosemide (LASIX) 20 MG tablet Take 20 mg by mouth daily.  1  . lidocaine-prilocaine (EMLA) cream Apply 1 application topically as needed. 30 g 3  . lisinopril (PRINIVIL,ZESTRIL) 20 MG tablet Take 20 mg by mouth daily.    . metoprolol succinate (TOPROL-XL) 50 MG 24 hr tablet Take 50 mg by mouth 2 (two) times daily. Take with or immediately following a meal.    . omega-3 acid ethyl esters (LOVAZA) 1 G capsule Take 1 g by mouth at bedtime.    . ondansetron (ZOFRAN) 8 MG tablet Take 1 tablet (8 mg total) by mouth 2 (two) times daily. Start the day after chemo for 2 days. Then take as needed for nausea or vomiting. 30 tablet 1  . phosphorus (PHOSPHA 250 NEUTRAL) 155-852-130 MG tablet Take 1 tablet (250 mg total) by mouth 2 (two) times daily. 60 tablet  6  . potassium chloride (K-DUR) 10 MEQ tablet Take 10 mEq by mouth daily.   1  . simvastatin (ZOCOR) 80 MG tablet Take 80 mg by mouth at bedtime.    . [DISCONTINUED] rivaroxaban (XARELTO) 20 MG TABS tablet Take 1 tablet (20 mg total) by mouth daily with supper. To start once finishes Xarelto 45m prescription. 30 tablet 3   No current facility-administered medications for this visit.   Facility-Administered Medications Ordered in Other Visits  Medication Dose Route Frequency Provider Last Rate Last Dose  . heparin lock flush 100 unit/mL  500 Units Intravenous Once JForest Gleason MD      . sodium chloride 0.9 % injection 10 mL  10 mL Intracatheter PRN JForest Gleason MD   10 mL at 07/07/15 0911    OBJECTIVE: BP 138/88 mmHg  Pulse 112  Temp(Src) 98.3  F (36.8 C) (Tympanic)  Resp 18  Wt 328 lb 8 oz (149.007 kg)   Body mass index is 56.36 kg/(m^2).    ECOG FS:0 - Asymptomatic  General: Well-developed, well-nourished, no acute distress. Eyes: Pink conjunctiva, anicteric sclera. HEENT: Normocephalic, moist mucous membranes, clear oropharnyx. Lungs: Clear to auscultation bilaterally. Heart: Regular rate and rhythm. No rubs, murmurs, or gallops. Abdomen: Soft, nontender, nondistended. No organomegaly noted, normoactive bowel sounds. Musculoskeletal: No edema, cyanosis, or clubbing. Neuro: Alert, answering all questions appropriately. Cranial nerves grossly intact. Skin: No rashes or petechiae noted. Psych: Normal affect. Lymphatics: No cervical, calvicular, axillary or inguinal LAD.  Right upper extremity lymphedema is gradually getting better.  Is grade 3 lymphedema. Right breast: In duration persist however open wound is gradually healing.  There are multiple lady is a bleeding.  Right breast mass is gradually decreasing.  Wound size is now 9 x 4.5 cm and is very superficial LAB RESULTS:                          Infusion on 07/28/2015  Component Date Value Ref Range Status  .  WBC 07/28/2015 9.9  3.6 - 11.0 K/uL Final  . RBC 07/28/2015 4.00  3.80 - 5.20 MIL/uL Final  . Hemoglobin 07/28/2015 11.0* 12.0 - 16.0 g/dL Final  . HCT 07/28/2015 33.6* 35.0 - 47.0 % Final  . MCV 07/28/2015 84.1  80.0 - 100.0 fL Final  . MCH 07/28/2015 27.6  26.0 - 34.0 pg Final  . MCHC 07/28/2015 32.8  32.0 - 36.0 g/dL Final  . RDW 07/28/2015 23.1* 11.5 - 14.5 % Final  . Platelets 07/28/2015 312  150 - 440 K/uL Final  . Neutrophils Relative % 07/28/2015 75   Final  . Neutro Abs 07/28/2015 7.4* 1.4 - 6.5 K/uL Final  . Lymphocytes Relative 07/28/2015 12   Final  . Lymphs Abs 07/28/2015 1.2  1.0 - 3.6 K/uL Final  . Monocytes Relative 07/28/2015 9   Final  . Monocytes Absolute 07/28/2015 0.9  0.2 - 0.9 K/uL Final  . Eosinophils Relative 07/28/2015 3   Final  . Eosinophils Absolute 07/28/2015 0.3  0 - 0.7 K/uL Final  . Basophils Relative 07/28/2015 1   Final  . Basophils Absolute 07/28/2015 0.1  0 - 0.1 K/uL Final  . Sodium 07/28/2015 137  135 - 145 mmol/L Final  . Potassium 07/28/2015 3.5  3.5 - 5.1 mmol/L Final  . Chloride 07/28/2015 104  101 - 111 mmol/L Final  . CO2 07/28/2015 23  22 - 32 mmol/L Final  . Glucose, Bld 07/28/2015 147* 65 - 99 mg/dL Final  . BUN 07/28/2015 7  6 - 20 mg/dL Final  . Creatinine, Ser 07/28/2015 0.67  0.44 - 1.00 mg/dL Final  . Calcium 07/28/2015 8.6* 8.9 - 10.3 mg/dL Final  . Total Protein 07/28/2015 6.9  6.5 - 8.1 g/dL Final  . Albumin 07/28/2015 3.0* 3.5 - 5.0 g/dL Final  . AST 07/28/2015 13* 15 - 41 U/L Final  . ALT 07/28/2015 10* 14 - 54 U/L Final  . Alkaline Phosphatase 07/28/2015 55  38 - 126 U/L Final  . Total Bilirubin 07/28/2015 0.6  0.3 - 1.2 mg/dL Final  . GFR calc non Af Amer 07/28/2015 >60  >60 mL/min Final  . GFR calc Af Amer 07/28/2015 >60  >60 mL/min Final   Comment: (NOTE) The eGFR has been calculated using the CKD EPI equation. This calculation has not been validated in all  clinical situations. eGFR's persistently <60 mL/min signify  possible Chronic Kidney Disease.   . Anion gap 07/28/2015 10  5 - 15 Final   CT scan of the chest and abdomen has been reviewed. All the lesions were measured. CT scan and bone scan has been reviewed independently. Julie's in his left upper lobe infiltrate which appears to be inflammatory with air bronchogram Bone scan has been reported to be completely negative has been reviewed independently  ASSESSMENT:  Stage IV carcinoma of breast On weekly Taxol. Considering all the risks is criteria patient does not have any progressive disease. Left upper lobe new lesion appears to be inflammatory in nature we will start patient on Augmentin 875 mg every 12 hourly for 7 days and repeat CT scan of chest in 4 weeks Tumor markers also will be repeated in one week Continue chemotherapy as per protocol.  This has been discussed with the patient and research nurses.   seling patient and family regarding prognosis and options of treatment and available resources. Reviewing scan Assessment    Of  tumor And management of new onset of thrombus Forest Gleason, MD   07/28/2015 10:27 AM

## 2015-08-04 ENCOUNTER — Inpatient Hospital Stay: Payer: BC Managed Care – PPO

## 2015-08-04 ENCOUNTER — Inpatient Hospital Stay: Payer: BC Managed Care – PPO | Attending: Oncology

## 2015-08-04 VITALS — BP 116/75 | HR 83 | Temp 98.1°F | Resp 18

## 2015-08-04 DIAGNOSIS — Z170 Estrogen receptor positive status [ER+]: Secondary | ICD-10-CM | POA: Insufficient documentation

## 2015-08-04 DIAGNOSIS — C778 Secondary and unspecified malignant neoplasm of lymph nodes of multiple regions: Secondary | ICD-10-CM | POA: Insufficient documentation

## 2015-08-04 DIAGNOSIS — C7989 Secondary malignant neoplasm of other specified sites: Secondary | ICD-10-CM | POA: Insufficient documentation

## 2015-08-04 DIAGNOSIS — C50911 Malignant neoplasm of unspecified site of right female breast: Secondary | ICD-10-CM

## 2015-08-04 DIAGNOSIS — C7951 Secondary malignant neoplasm of bone: Principal | ICD-10-CM

## 2015-08-04 DIAGNOSIS — Z006 Encounter for examination for normal comparison and control in clinical research program: Secondary | ICD-10-CM | POA: Insufficient documentation

## 2015-08-04 DIAGNOSIS — C7972 Secondary malignant neoplasm of left adrenal gland: Secondary | ICD-10-CM | POA: Diagnosis not present

## 2015-08-04 DIAGNOSIS — Z5111 Encounter for antineoplastic chemotherapy: Secondary | ICD-10-CM | POA: Insufficient documentation

## 2015-08-04 LAB — CBC WITH DIFFERENTIAL/PLATELET
Basophils Absolute: 0.2 10*3/uL — ABNORMAL HIGH (ref 0–0.1)
Basophils Relative: 3 %
EOS ABS: 0.3 10*3/uL (ref 0–0.7)
EOS PCT: 4 %
HCT: 33.6 % — ABNORMAL LOW (ref 35.0–47.0)
HEMOGLOBIN: 10.7 g/dL — AB (ref 12.0–16.0)
LYMPHS ABS: 1.4 10*3/uL (ref 1.0–3.6)
LYMPHS PCT: 16 %
MCH: 27.3 pg (ref 26.0–34.0)
MCHC: 32 g/dL (ref 32.0–36.0)
MCV: 85.2 fL (ref 80.0–100.0)
MONOS PCT: 5 %
Monocytes Absolute: 0.4 10*3/uL (ref 0.2–0.9)
NEUTROS PCT: 72 %
Neutro Abs: 6.4 10*3/uL (ref 1.4–6.5)
Platelets: 358 10*3/uL (ref 150–440)
RBC: 3.94 MIL/uL (ref 3.80–5.20)
RDW: 23.6 % — ABNORMAL HIGH (ref 11.5–14.5)
WBC: 8.8 10*3/uL (ref 3.6–11.0)

## 2015-08-04 LAB — COMPREHENSIVE METABOLIC PANEL
ALK PHOS: 47 U/L (ref 38–126)
ALT: 18 U/L (ref 14–54)
ANION GAP: 8 (ref 5–15)
AST: 18 U/L (ref 15–41)
Albumin: 3.2 g/dL — ABNORMAL LOW (ref 3.5–5.0)
BUN: 12 mg/dL (ref 6–20)
CALCIUM: 8.8 mg/dL — AB (ref 8.9–10.3)
CO2: 23 mmol/L (ref 22–32)
CREATININE: 0.69 mg/dL (ref 0.44–1.00)
Chloride: 105 mmol/L (ref 101–111)
Glucose, Bld: 159 mg/dL — ABNORMAL HIGH (ref 65–99)
Potassium: 4 mmol/L (ref 3.5–5.1)
SODIUM: 136 mmol/L (ref 135–145)
TOTAL PROTEIN: 6.7 g/dL (ref 6.5–8.1)
Total Bilirubin: 0.4 mg/dL (ref 0.3–1.2)

## 2015-08-04 MED ORDER — SODIUM CHLORIDE 0.9 % IV SOLN
Freq: Once | INTRAVENOUS | Status: AC
  Administered 2015-08-04: 10:00:00 via INTRAVENOUS
  Filled 2015-08-04: qty 4

## 2015-08-04 MED ORDER — SODIUM CHLORIDE 0.9 % IV SOLN
Freq: Once | INTRAVENOUS | Status: AC
  Administered 2015-08-04: 10:00:00 via INTRAVENOUS
  Filled 2015-08-04: qty 1000

## 2015-08-04 MED ORDER — SODIUM CHLORIDE 0.9 % IJ SOLN
10.0000 mL | INTRAMUSCULAR | Status: DC | PRN
  Administered 2015-08-04: 10 mL
  Filled 2015-08-04: qty 10

## 2015-08-04 MED ORDER — FAMOTIDINE IN NACL 20-0.9 MG/50ML-% IV SOLN
20.0000 mg | Freq: Once | INTRAVENOUS | Status: AC
  Administered 2015-08-04: 20 mg via INTRAVENOUS
  Filled 2015-08-04: qty 50

## 2015-08-04 MED ORDER — HEPARIN SOD (PORK) LOCK FLUSH 100 UNIT/ML IV SOLN
INTRAVENOUS | Status: AC
Start: 2015-08-04 — End: 2015-08-04
  Filled 2015-08-04: qty 5

## 2015-08-04 MED ORDER — PACLITAXEL CHEMO INJECTION 300 MG/50ML
90.0000 mg/m2 | Freq: Once | INTRAVENOUS | Status: AC
  Administered 2015-08-04: 240 mg via INTRAVENOUS
  Filled 2015-08-04: qty 40

## 2015-08-04 MED ORDER — DIPHENHYDRAMINE HCL 50 MG/ML IJ SOLN
50.0000 mg | Freq: Once | INTRAMUSCULAR | Status: AC
  Administered 2015-08-04: 50 mg via INTRAVENOUS
  Filled 2015-08-04: qty 1

## 2015-08-04 MED ORDER — HEPARIN SOD (PORK) LOCK FLUSH 100 UNIT/ML IV SOLN
500.0000 [IU] | Freq: Once | INTRAVENOUS | Status: AC | PRN
  Administered 2015-08-04: 500 [IU]

## 2015-08-04 NOTE — Progress Notes (Signed)
Patient returns today for consideration of Cycle 7 Day 8 Taxol infusion. Patient reports cough and congestion is improved, congestion is breaking up and able to cough it up now. She finished last dose of antibiotics this morning.  Patient also reports the numbness in her fingers and toes extends to the first knuckle now. She states she continues to function well and the numbness doesn't affect her ability to do anything at this point. Reviewed labs with Dr. Oliva Bustard and reported to him that her cough and congestion has improved with antibiotics. Hemoglobin is 10.7, per Dr. Oliva Bustard her anemia is stable and we will continue to monitor. Instructed patient to contact us if any symptoms arise between now and next visit a week from today, she verbalized understanding. Vital signs stable.   Reviewed PRO-CTCAE phone survey with the patient, no changes at this time. CBC and chemistries are within acceptable parameters to proceed with Taxol infusion today per Dr. Oliva Bustard.   Appointment scheduled for next visit, will get labs and Taxol infusion. AE's and attributions below: Mirian Mo, RN, BSN 08/04/2015 10:07 AM      Alopecia - grade 2; definitely attributed to Taxol Lymphedema - grade 2; unrelated (baseline) Fatigue - grade 1; definitely related  Neuropathy- grade 1; definitely related Lung infection- grade 2; unrelated Hypoalbuminemia- grade 1, unlikely related Weight loss- grade 1, unlikely related    Anemia- grade 1, unlikely related

## 2015-08-05 LAB — CANCER ANTIGEN 27.29: CA 27.29: 23 U/mL (ref 0.0–38.6)

## 2015-08-05 LAB — CEA: CEA: 78.9 ng/mL — AB (ref 0.0–4.7)

## 2015-08-11 ENCOUNTER — Inpatient Hospital Stay: Payer: BC Managed Care – PPO

## 2015-08-11 VITALS — BP 107/69 | HR 102 | Temp 98.7°F

## 2015-08-11 DIAGNOSIS — C7951 Secondary malignant neoplasm of bone: Principal | ICD-10-CM

## 2015-08-11 DIAGNOSIS — C50911 Malignant neoplasm of unspecified site of right female breast: Secondary | ICD-10-CM

## 2015-08-11 DIAGNOSIS — Z5111 Encounter for antineoplastic chemotherapy: Secondary | ICD-10-CM | POA: Diagnosis not present

## 2015-08-11 LAB — COMPREHENSIVE METABOLIC PANEL
ALK PHOS: 47 U/L (ref 38–126)
ALT: 17 U/L (ref 14–54)
AST: 17 U/L (ref 15–41)
Albumin: 3.6 g/dL (ref 3.5–5.0)
Anion gap: 8 (ref 5–15)
BILIRUBIN TOTAL: 0.6 mg/dL (ref 0.3–1.2)
BUN: 17 mg/dL (ref 6–20)
CALCIUM: 8.9 mg/dL (ref 8.9–10.3)
CHLORIDE: 108 mmol/L (ref 101–111)
CO2: 20 mmol/L — ABNORMAL LOW (ref 22–32)
CREATININE: 0.75 mg/dL (ref 0.44–1.00)
Glucose, Bld: 156 mg/dL — ABNORMAL HIGH (ref 65–99)
Potassium: 3.8 mmol/L (ref 3.5–5.1)
Sodium: 136 mmol/L (ref 135–145)
TOTAL PROTEIN: 7.1 g/dL (ref 6.5–8.1)

## 2015-08-11 LAB — CBC WITH DIFFERENTIAL/PLATELET
Basophils Absolute: 0.1 10*3/uL (ref 0–0.1)
Basophils Relative: 2 %
Eosinophils Absolute: 0.2 10*3/uL (ref 0–0.7)
Eosinophils Relative: 4 %
HCT: 34.4 % — ABNORMAL LOW (ref 35.0–47.0)
Hemoglobin: 11.2 g/dL — ABNORMAL LOW (ref 12.0–16.0)
Lymphocytes Relative: 26 %
Lymphs Abs: 1.2 10*3/uL (ref 1.0–3.6)
MCH: 27.8 pg (ref 26.0–34.0)
MCHC: 32.7 g/dL (ref 32.0–36.0)
MCV: 85 fL (ref 80.0–100.0)
Monocytes Absolute: 0.3 10*3/uL (ref 0.2–0.9)
Monocytes Relative: 6 %
Neutro Abs: 2.8 10*3/uL (ref 1.4–6.5)
Neutrophils Relative %: 62 %
Platelets: 344 10*3/uL (ref 150–440)
RBC: 4.04 MIL/uL (ref 3.80–5.20)
RDW: 24.3 % — ABNORMAL HIGH (ref 11.5–14.5)
WBC: 4.5 10*3/uL (ref 3.6–11.0)

## 2015-08-11 MED ORDER — DIPHENHYDRAMINE HCL 50 MG/ML IJ SOLN
50.0000 mg | Freq: Once | INTRAMUSCULAR | Status: AC
  Administered 2015-08-11: 50 mg via INTRAVENOUS
  Filled 2015-08-11: qty 1

## 2015-08-11 MED ORDER — PACLITAXEL CHEMO INJECTION 300 MG/50ML
75.0000 mg/m2 | Freq: Once | INTRAVENOUS | Status: AC
  Administered 2015-08-11: 198 mg via INTRAVENOUS
  Filled 2015-08-11: qty 33

## 2015-08-11 MED ORDER — FAMOTIDINE IN NACL 20-0.9 MG/50ML-% IV SOLN
20.0000 mg | Freq: Once | INTRAVENOUS | Status: AC
  Administered 2015-08-11: 20 mg via INTRAVENOUS
  Filled 2015-08-11: qty 50

## 2015-08-11 MED ORDER — SODIUM CHLORIDE 0.9 % IV SOLN
Freq: Once | INTRAVENOUS | Status: AC
  Administered 2015-08-11: 11:00:00 via INTRAVENOUS
  Filled 2015-08-11: qty 1000

## 2015-08-11 MED ORDER — HEPARIN SOD (PORK) LOCK FLUSH 100 UNIT/ML IV SOLN
500.0000 [IU] | Freq: Once | INTRAVENOUS | Status: AC | PRN
  Administered 2015-08-11: 500 [IU]
  Filled 2015-08-11: qty 5

## 2015-08-11 MED ORDER — SODIUM CHLORIDE 0.9 % IJ SOLN
10.0000 mL | INTRAMUSCULAR | Status: DC | PRN
  Administered 2015-08-11: 10 mL
  Filled 2015-08-11: qty 10

## 2015-08-11 MED ORDER — SODIUM CHLORIDE 0.9 % IV SOLN
Freq: Once | INTRAVENOUS | Status: AC
  Administered 2015-08-11: 11:00:00 via INTRAVENOUS
  Filled 2015-08-11: qty 4

## 2015-08-11 NOTE — Progress Notes (Signed)
MD reduced Taxol dose from 90mg  to 75mg /m2 due do peripheral neuropathy complaints.

## 2015-08-12 DIAGNOSIS — C50911 Malignant neoplasm of unspecified site of right female breast: Secondary | ICD-10-CM

## 2015-08-12 DIAGNOSIS — C7951 Secondary malignant neoplasm of bone: Principal | ICD-10-CM

## 2015-08-12 NOTE — Progress Notes (Signed)
Patient returns today for consideration of cycle 7 Day 15 Taxol infusion per ACCRU [id number]. Patient reports that she continues to experience a tingling sensation and soreness in fingers. She states "it's hard to distinguish between hot and cold" with fingers only. She also states her toes are numb on the ball of her toes and it "feels funny walking". This was discussed with Dr. Oliva Bustard and he states based on symptoms, she is an intolerable grade 2 peripheral neuropathy and he dose reduced the Taxol to a -1 level which is 75 mg/m2.  Dr. Oliva Bustard signed the orders for the dose reduction and the research team reviewed this change in dosing with the patient as well as the pharmacy and the infusion nurse. The patient also states that her breathing and coughing have greatly improved with recent antibiotics and that she is feeling much better. I reviewed the PRO-CTCAE phone survey with the patient. She continues to report some fatiue which remains a grade 1. Vital signs are stable today.  CBC and chemistries reviewed with Dr. Oliva Bustard and are within acceptable parameters to proceed with Taxol infusion.  Patient requests that future appointments be scheduled on Wednesdays. Her next visit is scheduled for Wednesday August 26, 2015 and she will see MD, get labs and begin Cycle 8 Day 1 Taxol infusion. AE's and attributions listed below: Mirian Mo, RN, BSN 08/11/2015  10:15am                                       Alopecia - grade 2; definitely attributed to Taxol Lymphedema - grade 2; unrelated (baseline) Fatigue - grade 1; definitely related  Neuropathy- grade 2 (intolerable per patient); definitely related Lung infection- grade 2; unrelated Weight loss- grade 1, unlikely

## 2015-08-13 ENCOUNTER — Other Ambulatory Visit: Payer: Self-pay | Admitting: Oncology

## 2015-08-21 ENCOUNTER — Ambulatory Visit: Admission: RE | Admit: 2015-08-21 | Payer: BC Managed Care – PPO | Source: Ambulatory Visit

## 2015-08-26 ENCOUNTER — Inpatient Hospital Stay: Payer: BC Managed Care – PPO | Admitting: Oncology

## 2015-08-26 ENCOUNTER — Inpatient Hospital Stay: Payer: BC Managed Care – PPO

## 2015-08-26 ENCOUNTER — Ambulatory Visit
Admission: RE | Admit: 2015-08-26 | Discharge: 2015-08-26 | Disposition: A | Discharge status: Home / Self Care | Payer: BC Managed Care – PPO | Source: Ambulatory Visit | Attending: Oncology | Admitting: Oncology

## 2015-08-26 DIAGNOSIS — C50911 Malignant neoplasm of unspecified site of right female breast: Secondary | ICD-10-CM

## 2015-08-26 DIAGNOSIS — I10 Essential (primary) hypertension: Secondary | ICD-10-CM | POA: Diagnosis not present

## 2015-08-26 DIAGNOSIS — R5383 Other fatigue: Secondary | ICD-10-CM | POA: Insufficient documentation

## 2015-08-26 DIAGNOSIS — Z6841 Body Mass Index (BMI) 40.0 and over, adult: Secondary | ICD-10-CM | POA: Insufficient documentation

## 2015-08-26 DIAGNOSIS — G4730 Sleep apnea, unspecified: Secondary | ICD-10-CM | POA: Diagnosis not present

## 2015-08-26 DIAGNOSIS — I252 Old myocardial infarction: Secondary | ICD-10-CM | POA: Diagnosis not present

## 2015-08-26 DIAGNOSIS — Z87891 Personal history of nicotine dependence: Secondary | ICD-10-CM | POA: Insufficient documentation

## 2015-08-26 DIAGNOSIS — Z79899 Other long term (current) drug therapy: Secondary | ICD-10-CM | POA: Diagnosis not present

## 2015-08-26 DIAGNOSIS — Z7982 Long term (current) use of aspirin: Secondary | ICD-10-CM | POA: Diagnosis not present

## 2015-08-26 DIAGNOSIS — Z006 Encounter for examination for normal comparison and control in clinical research program: Secondary | ICD-10-CM | POA: Diagnosis not present

## 2015-08-26 DIAGNOSIS — C7982 Secondary malignant neoplasm of genital organs: Secondary | ICD-10-CM | POA: Diagnosis not present

## 2015-08-26 DIAGNOSIS — C7989 Secondary malignant neoplasm of other specified sites: Secondary | ICD-10-CM | POA: Diagnosis not present

## 2015-08-26 DIAGNOSIS — C7951 Secondary malignant neoplasm of bone: Secondary | ICD-10-CM | POA: Diagnosis not present

## 2015-08-26 DIAGNOSIS — Z5111 Encounter for antineoplastic chemotherapy: Secondary | ICD-10-CM | POA: Insufficient documentation

## 2015-08-26 DIAGNOSIS — Z170 Estrogen receptor positive status [ER+]: Secondary | ICD-10-CM | POA: Diagnosis not present

## 2015-08-26 DIAGNOSIS — E279 Disorder of adrenal gland, unspecified: Secondary | ICD-10-CM | POA: Insufficient documentation

## 2015-08-26 DIAGNOSIS — C778 Secondary and unspecified malignant neoplasm of lymph nodes of multiple regions: Secondary | ICD-10-CM | POA: Insufficient documentation

## 2015-08-26 LAB — COMPREHENSIVE METABOLIC PANEL
ALK PHOS: 43 U/L (ref 38–126)
ALT: 12 U/L — AB (ref 14–54)
AST: 15 U/L (ref 15–41)
Albumin: 3.2 g/dL — ABNORMAL LOW (ref 3.5–5.0)
Anion gap: 6 (ref 5–15)
BUN: 11 mg/dL (ref 6–20)
CALCIUM: 8.6 mg/dL — AB (ref 8.9–10.3)
CO2: 23 mmol/L (ref 22–32)
CREATININE: 0.74 mg/dL (ref 0.44–1.00)
Chloride: 110 mmol/L (ref 101–111)
Glucose, Bld: 149 mg/dL — ABNORMAL HIGH (ref 65–99)
Potassium: 3.6 mmol/L (ref 3.5–5.1)
SODIUM: 139 mmol/L (ref 135–145)
Total Bilirubin: 0.5 mg/dL (ref 0.3–1.2)
Total Protein: 6.5 g/dL (ref 6.5–8.1)

## 2015-08-26 LAB — CBC WITH DIFFERENTIAL/PLATELET
BASOS ABS: 0.1 10*3/uL (ref 0–0.1)
Basophils Relative: 1 %
Eosinophils Absolute: 0.2 10*3/uL (ref 0–0.7)
Eosinophils Relative: 2 %
HCT: 33 % — ABNORMAL LOW (ref 35.0–47.0)
HEMOGLOBIN: 10.7 g/dL — AB (ref 12.0–16.0)
LYMPHS ABS: 0.9 10*3/uL — AB (ref 1.0–3.6)
LYMPHS PCT: 13 %
MCH: 27.6 pg (ref 26.0–34.0)
MCHC: 32.3 g/dL (ref 32.0–36.0)
MCV: 85.6 fL (ref 80.0–100.0)
Monocytes Absolute: 0.7 10*3/uL (ref 0.2–0.9)
Monocytes Relative: 9 %
NEUTROS PCT: 75 %
Neutro Abs: 5.3 10*3/uL (ref 1.4–6.5)
Platelets: 255 10*3/uL (ref 150–440)
RBC: 3.86 MIL/uL (ref 3.80–5.20)
RDW: 24.4 % — ABNORMAL HIGH (ref 11.5–14.5)
WBC: 7.1 10*3/uL (ref 3.6–11.0)

## 2015-08-26 NOTE — Progress Notes (Signed)
Patient returns to clinic today for consideration of cycle 8 Day 1 Taxol infusion per ACCRU [id number]. Per Dr. Oliva Bustard, patient will not receive treatment today. Dr. Oliva Bustard will see her tomorrow following results of CT scan and decide treatment plan at that time. Patient completed EORTC QLQ Questionnaire survey today. Reviewed the phone PRO-CTCAE survey with patient. No new symptoms reported. The research team discussed the plan with the patient and she agrees with the plan to return tomorrow at 1:30pm to see MD and decide on further treatment plan either on or off protocol. AE's and attributions are as follows: Mirian Mo, RN, BSN 08/26/2015 2:51 PM       Alopecia - grade 2; definitely attributed to Taxol Lymphedema - grade 2; unrelated (baseline) Fatigue - grade 1; definitely related  Neuropathy- grade 2 (intolerable per patient); definitely related Lung infection- grade 2; unrelated Weight loss- grade 1, unlikelyAE's and attributions are listed below:

## 2015-08-27 ENCOUNTER — Encounter: Payer: Self-pay | Admitting: Oncology

## 2015-08-27 ENCOUNTER — Inpatient Hospital Stay: Payer: BC Managed Care – PPO

## 2015-08-27 ENCOUNTER — Inpatient Hospital Stay: Payer: BC Managed Care – PPO | Attending: Oncology | Admitting: Oncology

## 2015-08-27 VITALS — BP 151/84 | HR 105 | Temp 98.8°F | Resp 18 | Ht 65.5 in | Wt 335.1 lb

## 2015-08-27 VITALS — BP 148/78

## 2015-08-27 DIAGNOSIS — Z170 Estrogen receptor positive status [ER+]: Secondary | ICD-10-CM

## 2015-08-27 DIAGNOSIS — G4730 Sleep apnea, unspecified: Secondary | ICD-10-CM

## 2015-08-27 DIAGNOSIS — Z87891 Personal history of nicotine dependence: Secondary | ICD-10-CM

## 2015-08-27 DIAGNOSIS — C7951 Secondary malignant neoplasm of bone: Principal | ICD-10-CM

## 2015-08-27 DIAGNOSIS — C50911 Malignant neoplasm of unspecified site of right female breast: Secondary | ICD-10-CM

## 2015-08-27 DIAGNOSIS — Z006 Encounter for examination for normal comparison and control in clinical research program: Secondary | ICD-10-CM

## 2015-08-27 DIAGNOSIS — C7989 Secondary malignant neoplasm of other specified sites: Secondary | ICD-10-CM

## 2015-08-27 DIAGNOSIS — I252 Old myocardial infarction: Secondary | ICD-10-CM

## 2015-08-27 DIAGNOSIS — C7972 Secondary malignant neoplasm of left adrenal gland: Secondary | ICD-10-CM

## 2015-08-27 DIAGNOSIS — Z7982 Long term (current) use of aspirin: Secondary | ICD-10-CM

## 2015-08-27 DIAGNOSIS — Z79899 Other long term (current) drug therapy: Secondary | ICD-10-CM

## 2015-08-27 DIAGNOSIS — C778 Secondary and unspecified malignant neoplasm of lymph nodes of multiple regions: Secondary | ICD-10-CM

## 2015-08-27 DIAGNOSIS — I10 Essential (primary) hypertension: Secondary | ICD-10-CM

## 2015-08-27 DIAGNOSIS — Z6841 Body Mass Index (BMI) 40.0 and over, adult: Secondary | ICD-10-CM

## 2015-08-27 MED ORDER — SODIUM CHLORIDE 0.9 % IV SOLN
Freq: Once | INTRAVENOUS | Status: AC
  Administered 2015-08-27: 14:00:00 via INTRAVENOUS
  Filled 2015-08-27: qty 1000

## 2015-08-27 MED ORDER — SODIUM CHLORIDE 0.9 % IJ SOLN
10.0000 mL | INTRAMUSCULAR | Status: DC | PRN
  Administered 2015-08-27: 10 mL
  Filled 2015-08-27: qty 10

## 2015-08-27 MED ORDER — PACLITAXEL CHEMO INJECTION 300 MG/50ML: 75.0000 mg/m2 | Freq: Once | INTRAVENOUS | Status: DC

## 2015-08-27 MED ORDER — DIPHENHYDRAMINE HCL 50 MG/ML IJ SOLN
50.0000 mg | Freq: Once | INTRAMUSCULAR | Status: AC
  Administered 2015-08-27: 50 mg via INTRAVENOUS
  Filled 2015-08-27: qty 1

## 2015-08-27 MED ORDER — PACLITAXEL CHEMO INJECTION 300 MG/50ML
75.0000 mg/m2 | Freq: Once | INTRAVENOUS | Status: AC
  Administered 2015-08-27: 198 mg via INTRAVENOUS
  Filled 2015-08-27: qty 33

## 2015-08-27 MED ORDER — HEPARIN SOD (PORK) LOCK FLUSH 100 UNIT/ML IV SOLN
500.0000 [IU] | Freq: Once | INTRAVENOUS | Status: AC | PRN
  Administered 2015-08-27: 500 [IU]
  Filled 2015-08-27: qty 5

## 2015-08-27 MED ORDER — FAMOTIDINE IN NACL 20-0.9 MG/50ML-% IV SOLN
20.0000 mg | Freq: Once | INTRAVENOUS | Status: AC
  Administered 2015-08-27: 20 mg via INTRAVENOUS
  Filled 2015-08-27: qty 50

## 2015-08-27 MED ORDER — SODIUM CHLORIDE 0.9 % IV SOLN
Freq: Once | INTRAVENOUS | Status: AC
  Administered 2015-08-27: 15:00:00 via INTRAVENOUS
  Filled 2015-08-27: qty 4

## 2015-08-27 NOTE — Progress Notes (Signed)
Patient returns to clinic today for consideration of cycle 8 Day 1 Taxol infusion per ACCRU [id number]. Please see research encounter dated 08/26/2015. Dr Oliva Bustard examined patient today and assessed wound lesion. Lesion measures 3.5 X 5.0 cm. She reports no new symptoms. Neuropathy is the same as reported by patient. Calcium level of 8.110m/dL is a grade 1 today. After having increased to a level of 3.6g/dL at her last visit on 08/11/15, her albumin level is 3.2g/dL which is also a grade 1 today. In addition, her hemoglobin is 10.7g/dL. CBC and Met C reviewed by Dr. COliva Bustardand all within acceptable parameters to proceed with Taxol infusion today. Dr. COliva Bustarddiscussed CT scan results and he told her that her tumors had started increasing but not enough to be considered progressive disease at this time. She will be scheduled for another CT scan in 8 weeks at which time she will be re-evaluated.  She will return to clinic on September 02, 2015 for labs and Taxol infusion. AE's and attributions are listed below:  JMirian Mo RN, BSN 08/27/2015 3:29 PM        Alopecia - grade 2; definitely related to Taxol Lymphedema - grade 2; unrelated (baseline) Fatigue - grade 1; definitely related  Neuropathy- grade 2 (intolerable per patient); definitely related Lung infection- grade 2; unrelated Weight loss- grade 1, unlikely related    Hypoalbuminemia- grade 1, unrelated    Hypocalcemia- grade 1, possibly related    Anemia- grade 1, possibly related

## 2015-08-27 NOTE — Progress Notes (Signed)
[place]  Telephone:([phone]) [phone]  Fax:([phone]) [phone]     [name] DOB: [birth year]  MR#: [id number]  GLO#:[id number]  Patient Care Team: Kirk Ruths, MD as PCP - General (Internal Medicine)  CHIEF COMPLAINT:  Chief Complaint  Patient presents with  . Breast Cancer    This 60 y.o. female patient presents to the clinic for follow-up metastatic breast cancer.   Chief Complaint/Problem List: 1. Breast cancer see also 07/08/13 note, initially a T4 N2 M1 ER positive HER-2 negative stage IV disease 2. Anemia iron deficiency attributed to prior bleeding from wound C earlier documents 3. Right arm lymphedema prior cellulitis 4. Right breast nonhealing but stable wound 5. Past history of hypertension hyperlipidemia coronary disease stenting      ONCOLOGY HISTORY: Oncology History   1. 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 Previously noted long-term bleeding from the wound of right breast associated with cancer. 2. Patient has extensive right arm lymphedema. Patient is limited in range of motion due to lymphedema and pain in right arm. 3. Patient was started on Xgeva and letrozole in January 2015, [name] was started in April 2015. 4. CEA at lowest 7.4, failure of Ibrance and letrozole with steady rise in CEA. Most recent CEA in May 2016 reported as 223.    6.[place] XGEVA FIRST [place] 06/2013 LETRAZOLE 07/12/13  IBRANCE 10/10/13. ibrance 12/27/13..cea lowest at 7.4, but failure of ibrance and letrozole as steady rise since      7.Stage IV breast cancer with metastases to bone and adrenal gland.his and has been started on Taxol weekly under the protocol under protocol randomizing between Taxol and   Eribulin.   INTERVAL HISTORY:  60 year old lady came today further follow-up regarding stage IV carcinoma of breast.  Since last evaluation Patient is  here for ongoing evaluation.  CT scan has been ordered.  Left upper lobe infiltrate has responded to antibiotic therapy and completely resolved.  However there is some growth off underlying tumor.  Adding up this data it shows less than acceptable range to define this as a progressive disease.  Tingling numbness has improved.  Patient is on reduced dose of TAxol under protocol.  Here for ongoing evaluation and treatment consideration   ROS GENERAL:  Feels good.  Active.  No fevers, sweats or weight loss. PERFORMANCE STATUS (ECOG):  0 HEENT:  No visual changes, runny nose, sore throat, mouth sores or tenderness.epistaxis which is resolved Lungs: No shortness of breath or cough.  No hemoptysis. Cardiac:  No chest pain, palpitations, orthopnea, or PND. GI:  No nausea, vomiting, diarrhea, constipation, melena or hematochezia. GU:  No urgency, frequency, dysuria, hematuria has resolvedMusculoskeletal:  No back pain.  No joint pain.  No muscle tenderness. Extremities:  Right upper extremity swelling Skin:  No rashes or skin changes. Neuro: numbness on the tip of the finger and tape of the toes not interfering with any average daily life. Endocrine:  No diabetes, thyroid issues, hot flashes or night sweats. Psych:  No mood changes, depression or anxiety. Pain:  No focal pain. Review of systems:  All other systems reviewed and found to be negative. Breast wound is slowly healing.  Patient is attending wound care center once a month   PAST MEDICAL HISTORY: Past Medical History  Diagnosis Date  . Hypertension   . Myocardial infarction ([place])   . Anginal pain ([place])   .  Sleep apnea   . Anemia   . Post-menopausal 2006  . Breast cancer ([place])     2014   . Breast cancer metastasized to bone ([place]) 11/21/2014    PAST SURGICAL HISTORY: Past Surgical History  Procedure Laterality Date  . Coronary angioplasty with stent placement N/A   . Hernia repair      umbilical hernia repair  . Portacath  placement Right 02/03/2015    Procedure: INSERTION PORT-A-CATH;  Surgeon: [name], MD;  Location: [facility] ORS;  Service: General;  Laterality: Right;    FAMILY HISTORY There is no significant family history of breast cancer, ovarian cancer, colon cancer    ADVANCED DIRECTIVES:  Patient does not have any living will or healthcare power of attorney.  Information was given .  Available resources had been discussed.  We will follow-up on subsequent appointments regarding this issue  HEALTH MAINTENANCE: Social History  Substance Use Topics  . Smoking status: Former Smoker -- 1.00 packs/day    Types: Cigarettes    Quit date: 10/25/2004  . Smokeless tobacco: Never Used     Comment: stopped smoking greater than 7 years ago  . Alcohol Use: No       Allergies  Allergen Reactions  . No Known Allergies     Current Outpatient Prescriptions  Medication Sig Dispense Refill  . acetaminophen (TYLENOL) 500 MG tablet Take 1,000 mg by mouth every 6 (six) hours as needed.    Marland Kitchen amoxicillin-clavulanate (AUGMENTIN) 875-125 MG tablet Take 1 tablet by mouth 2 (two) times daily. 14 tablet 0  . aspirin EC 81 MG tablet Take 81 mg by mouth daily.    . clopidogrel (PLAVIX) 75 MG tablet Take 75 mg by mouth daily.    . [employer] CALCIUM 600+D 600-800 MG-UNIT TABS Take 1 tablet by mouth daily. At noon  3  . fexofenadine (ALLEGRA) 180 MG tablet Take 180 mg by mouth at bedtime.    . furosemide (LASIX) 20 MG tablet Take 20 mg by mouth daily.  1  . lidocaine-prilocaine (EMLA) cream Apply 1 application topically as needed. 30 g 3  . lisinopril (PRINIVIL,ZESTRIL) 20 MG tablet Take 20 mg by mouth daily.    . metoprolol succinate (TOPROL-XL) 50 MG 24 hr tablet Take 50 mg by mouth 2 (two) times daily. Take with or immediately following a meal.    . omega-3 acid ethyl esters (LOVAZA) 1 G capsule Take 1 g by mouth at bedtime.    . ondansetron (ZOFRAN) 8 MG tablet Take 1 tablet (8 mg total) by mouth 2 (two) times  daily. Start the day after chemo for 2 days. Then take as needed for nausea or vomiting. 30 tablet 1  . PHOSPHA 250 NEUTRAL 155-852-130 MG tablet TAKE 1 TABLET BY MOUTH TWICE DAILY 60 tablet 5  . potassium chloride (K-DUR) 10 MEQ tablet Take 10 mEq by mouth daily.   1  . simvastatin (ZOCOR) 80 MG tablet Take 80 mg by mouth at bedtime.    . [DISCONTINUED] rivaroxaban (XARELTO) 20 MG TABS tablet Take 1 tablet (20 mg total) by mouth daily with supper. To start once finishes Xarelto 63m prescription. 30 tablet 3   No current facility-administered medications for this visit.   Facility-Administered Medications Ordered in Other Visits  Medication Dose Route Frequency Provider Last Rate Last Dose  . sodium chloride 0.9 % injection 10 mL  10 mL Intracatheter PRN JForest Gleason MD   10 mL at 07/07/15 0911    OBJECTIVE: BP  151/84 mmHg  Pulse 105  Temp(Src) 98.8 F (37.1 C) (Tympanic)  Resp 18  Ht 5' 5.5" (1.664 m)  Wt 335 lb 2 oz (152.012 kg)  BMI 54.90 kg/m2  SpO2 99%   Body mass index is 54.9 kg/(m^2).    ECOG FS:0 - Asymptomatic  General: Well-developed, well-nourished, no acute distress. Eyes: Pink conjunctiva, anicteric sclera. HEENT: Normocephalic, moist mucous membranes, clear oropharnyx. Lungs: Clear to auscultation bilaterally. Heart: Regular rate and rhythm. No rubs, murmurs, or gallops. Abdomen: Soft, nontender, nondistended. No organomegaly noted, normoactive bowel sounds. Musculoskeletal: No edema, cyanosis, or clubbing. Neuro: Alert, answering all questions appropriately. Cranial nerves grossly intact. Skin: No rashes or petechiae noted. Psych: Normal affect. Lymphatics: No cervical, calvicular, axillary or inguinal LAD.  Right upper extremity lymphedema is gradually getting better.  Is grade 3 lymphedema. Right breast: In duration persist however open wound is gradually healing.  There are multiple lady is a bleeding.  Right breast mass is gradually decreasing.  Wound size  is now 9 x 4.5 cm and is very superficial LAB RESULTS:                          Appointment on 08/26/2015  Component Date Value Ref Range Status  . WBC 08/26/2015 7.1  3.6 - 11.0 K/uL Final  . RBC 08/26/2015 3.86  3.80 - 5.20 MIL/uL Final  . Hemoglobin 08/26/2015 10.7* 12.0 - 16.0 g/dL Final  . HCT 08/26/2015 33.0* 35.0 - 47.0 % Final  . MCV 08/26/2015 85.6  80.0 - 100.0 fL Final  . MCH 08/26/2015 27.6  26.0 - 34.0 pg Final  . MCHC 08/26/2015 32.3  32.0 - 36.0 g/dL Final  . RDW 08/26/2015 24.4* 11.5 - 14.5 % Final  . Platelets 08/26/2015 255  150 - 440 K/uL Final  . Neutrophils Relative % 08/26/2015 75   Final  . Neutro Abs 08/26/2015 5.3  1.4 - 6.5 K/uL Final  . Lymphocytes Relative 08/26/2015 13   Final  . Lymphs Abs 08/26/2015 0.9* 1.0 - 3.6 K/uL Final  . Monocytes Relative 08/26/2015 9   Final  . Monocytes Absolute 08/26/2015 0.7  0.2 - 0.9 K/uL Final  . Eosinophils Relative 08/26/2015 2   Final  . Eosinophils Absolute 08/26/2015 0.2  0 - 0.7 K/uL Final  . Basophils Relative 08/26/2015 1   Final  . Basophils Absolute 08/26/2015 0.1  0 - 0.1 K/uL Final  . Sodium 08/26/2015 139  135 - 145 mmol/L Final  . Potassium 08/26/2015 3.6  3.5 - 5.1 mmol/L Final  . Chloride 08/26/2015 110  101 - 111 mmol/L Final  . CO2 08/26/2015 23  22 - 32 mmol/L Final  . Glucose, Bld 08/26/2015 149* 65 - 99 mg/dL Final  . BUN 08/26/2015 11  6 - 20 mg/dL Final  . Creatinine, Ser 08/26/2015 0.74  0.44 - 1.00 mg/dL Final  . Calcium 08/26/2015 8.6* 8.9 - 10.3 mg/dL Final  . Total Protein 08/26/2015 6.5  6.5 - 8.1 g/dL Final  . Albumin 08/26/2015 3.2* 3.5 - 5.0 g/dL Final  . AST 08/26/2015 15  15 - 41 U/L Final  . ALT 08/26/2015 12* 14 - 54 U/L Final  . Alkaline Phosphatase 08/26/2015 43  38 - 126 U/L Final  . Total Bilirubin 08/26/2015 0.5  0.3 - 1.2 mg/dL Final  . GFR calc non Af Amer 08/26/2015 >60  >60 mL/min Final  . GFR calc Af Amer 08/26/2015 >60  >60 mL/min Final  Comment: (NOTE) The  eGFR has been calculated using the CKD EPI equation. This calculation has not been validated in all clinical situations. eGFR's persistently <60 mL/min signify possible Chronic Kidney Disease.   . Anion gap 08/26/2015 6  5 - 15 Final   CT scan of the chest and abdomen has been reviewed. All the lesions were measured. CT scan and bone scan has been reviewed independently. Julie's in his left upper lobe infiltrate which appears to be inflammatory with air bronchogram Bone scan has been reported to be completely negative has been reviewed independently  ASSESSMENT:  Stage IV carcinoma of breast On weekly Taxol. CT scan has been reviewed independently that is slight progressive disease however does not meet criteria as per protocol of progressive disease.  Remo Lipps under category of stable disease.  We will continue chemotherapy with reduced dose of Taxol. Wound has been examined there is some dated tissue on the top of the ulcers and we might agree for patient back   To wound clinic  clinic for a debrignment   Discussed situation with research nurses. I told patient that after 8 weeks if received further progressive disease then patient is to have change in chemotherapy most likely to be reviewed in or Adriamycin-containing regimen seling patient and family regarding prognosis and options of treatment and available resources. Reviewing scan Assessment    Of  tumor And management of new onset of thrombus Amy Gleason, MD   08/27/2015 2:02 PM

## 2015-08-30 NOTE — Progress Notes (Unsigned)
This encounter was created in error - please disregard.

## 2015-09-02 ENCOUNTER — Other Ambulatory Visit: Payer: BC Managed Care – PPO

## 2015-09-02 ENCOUNTER — Ambulatory Visit: Payer: BC Managed Care – PPO

## 2015-09-03 ENCOUNTER — Inpatient Hospital Stay: Payer: BC Managed Care – PPO

## 2015-09-03 VITALS — BP 110/69 | HR 93 | Temp 99.8°F | Resp 20

## 2015-09-03 DIAGNOSIS — C7951 Secondary malignant neoplasm of bone: Principal | ICD-10-CM

## 2015-09-03 DIAGNOSIS — C50911 Malignant neoplasm of unspecified site of right female breast: Secondary | ICD-10-CM | POA: Diagnosis not present

## 2015-09-03 LAB — CBC WITH DIFFERENTIAL/PLATELET
BASOS ABS: 0.1 10*3/uL (ref 0–0.1)
BASOS PCT: 1 %
EOS PCT: 4 %
Eosinophils Absolute: 0.4 10*3/uL (ref 0–0.7)
HEMATOCRIT: 33.1 % — AB (ref 35.0–47.0)
Hemoglobin: 10.7 g/dL — ABNORMAL LOW (ref 12.0–16.0)
Lymphocytes Relative: 16 %
Lymphs Abs: 1.3 10*3/uL (ref 1.0–3.6)
MCH: 27.6 pg (ref 26.0–34.0)
MCHC: 32.4 g/dL (ref 32.0–36.0)
MCV: 85.2 fL (ref 80.0–100.0)
MONO ABS: 0.3 10*3/uL (ref 0.2–0.9)
Monocytes Relative: 4 %
NEUTROS ABS: 6.4 10*3/uL (ref 1.4–6.5)
Neutrophils Relative %: 75 %
PLATELETS: 291 10*3/uL (ref 150–440)
RBC: 3.88 MIL/uL (ref 3.80–5.20)
RDW: 23.2 % — AB (ref 11.5–14.5)
WBC: 8.5 10*3/uL (ref 3.6–11.0)

## 2015-09-03 LAB — COMPREHENSIVE METABOLIC PANEL
ALBUMIN: 3.3 g/dL — AB (ref 3.5–5.0)
ALT: 15 U/L (ref 14–54)
AST: 16 U/L (ref 15–41)
Alkaline Phosphatase: 47 U/L (ref 38–126)
Anion gap: 7 (ref 5–15)
BUN: 8 mg/dL (ref 6–20)
CHLORIDE: 104 mmol/L (ref 101–111)
CO2: 24 mmol/L (ref 22–32)
Calcium: 8.4 mg/dL — ABNORMAL LOW (ref 8.9–10.3)
Creatinine, Ser: 0.9 mg/dL (ref 0.44–1.00)
GFR calc Af Amer: >60 mL/min (ref >60)
GFR calc non Af Amer: >60 mL/min (ref >60)
GLUCOSE: 155 mg/dL — AB (ref 65–99)
POTASSIUM: 3.9 mmol/L (ref 3.5–5.1)
Sodium: 135 mmol/L (ref 135–145)
Total Bilirubin: 0.5 mg/dL (ref 0.3–1.2)
Total Protein: 6.6 g/dL (ref 6.5–8.1)

## 2015-09-03 MED ORDER — HEPARIN SOD (PORK) LOCK FLUSH 100 UNIT/ML IV SOLN
500.0000 [IU] | Freq: Once | INTRAVENOUS | Status: AC
  Administered 2015-09-03: 500 [IU] via INTRAVENOUS
  Filled 2015-09-03: qty 5

## 2015-09-03 MED ORDER — SODIUM CHLORIDE 0.9 % IV SOLN
Freq: Once | INTRAVENOUS | Status: AC
  Administered 2015-09-03: 15:00:00 via INTRAVENOUS
  Filled 2015-09-03: qty 4

## 2015-09-03 MED ORDER — SODIUM CHLORIDE 0.9% FLUSH
10.0000 mL | INTRAVENOUS | Status: DC | PRN
Start: 2015-09-03 — End: 2015-09-03
  Administered 2015-09-03: 10 mL via INTRAVENOUS
  Filled 2015-09-03: qty 10

## 2015-09-03 MED ORDER — PACLITAXEL CHEMO INJECTION 300 MG/50ML
75.0000 mg/m2 | Freq: Once | INTRAVENOUS | Status: AC
  Administered 2015-09-03: 198 mg via INTRAVENOUS
  Filled 2015-09-03: qty 33

## 2015-09-03 MED ORDER — DIPHENHYDRAMINE HCL 50 MG/ML IJ SOLN
50.0000 mg | Freq: Once | INTRAMUSCULAR | Status: AC
  Administered 2015-09-03: 50 mg via INTRAVENOUS
  Filled 2015-09-03: qty 1

## 2015-09-03 MED ORDER — SODIUM CHLORIDE 0.9 % IV SOLN
Freq: Once | INTRAVENOUS | Status: AC
  Administered 2015-09-03: 14:00:00 via INTRAVENOUS
  Filled 2015-09-03: qty 1000

## 2015-09-03 MED ORDER — FAMOTIDINE IN NACL 20-0.9 MG/50ML-% IV SOLN
20.0000 mg | Freq: Once | INTRAVENOUS | Status: AC
  Administered 2015-09-03: 20 mg via INTRAVENOUS
  Filled 2015-09-03: qty 50

## 2015-09-03 NOTE — Progress Notes (Signed)
Patient returns to clinic today for consideration of cycle 8 Day 8 Taxol infusion per ACCRU [id number].  Patient states she is doing well. She reports that she continues to experience numbness and tingling of the fingertips and toes but that it has not changed since the last visit on 08/27/2015; reviewed with Dr. Oliva Bustard. Neuropathy remains a grade 2. Reviewed CBC and chemistries with Dr. Oliva Bustard; all within acceptable parameters to proceed with infusion today. Reviewed phone PRO-CTCAE phone survey with patient and no changes reported. Patient will return to clinic on September 10, 2015 for labs and Taxol infusion. AE's and attributions below:  Mirian Mo, RN, BSN 09/03/2015 2:24 PM     TYSHAWN MCELDERRY [id number]  09/03/2015  Adverse Event Log  Study/Protocol: ACCRU [id number] Cycle: cycle 8 Day 8   Event Grade Onset Date Resolved Date Drug Name Attribution Treatment Comments  Alopecia Grade 2 baseline  Taxol Definite none   Lymphedema Grade 2 baseline  Taxol Unrelated none   Fatigue Grade 1 baseline  Taxol Definite none   Neuropathy Grade 2 08/11/15  Taxol Definite Dose reduced   Weight loss Grade 1 07/28/15  Taxol Unlikely none   Hypoalbuminemia Grade 1 07/28/15  Taxol Unrelated none   Hypocalcemia Grade 1 08/27/15  Taxol Possibly none   Anemia Grade 1 08/07/15  Taxol Possibly none   Mirian Mo, RN, BSN 09/03/2015 3:02 PM

## 2015-09-07 ENCOUNTER — Other Ambulatory Visit: Payer: Self-pay | Admitting: Oncology

## 2015-09-10 ENCOUNTER — Inpatient Hospital Stay: Payer: BC Managed Care – PPO

## 2015-09-10 VITALS — BP 109/71 | HR 91

## 2015-09-10 DIAGNOSIS — C50911 Malignant neoplasm of unspecified site of right female breast: Secondary | ICD-10-CM

## 2015-09-10 DIAGNOSIS — C7951 Secondary malignant neoplasm of bone: Principal | ICD-10-CM

## 2015-09-10 LAB — CBC WITH DIFFERENTIAL/PLATELET
Basophils Absolute: 0.1 10*3/uL (ref 0–0.1)
Basophils Relative: 1 %
Eosinophils Absolute: 0.2 10*3/uL (ref 0–0.7)
Eosinophils Relative: 5 %
HEMATOCRIT: 32.6 % — AB (ref 35.0–47.0)
HEMOGLOBIN: 10.6 g/dL — AB (ref 12.0–16.0)
LYMPHS ABS: 1.3 10*3/uL (ref 1.0–3.6)
Lymphocytes Relative: 25 %
MCH: 27.7 pg (ref 26.0–34.0)
MCHC: 32.5 g/dL (ref 32.0–36.0)
MCV: 85 fL (ref 80.0–100.0)
MONOS PCT: 6 %
Monocytes Absolute: 0.3 10*3/uL (ref 0.2–0.9)
NEUTROS ABS: 3.2 10*3/uL (ref 1.4–6.5)
NEUTROS PCT: 63 %
Platelets: 285 10*3/uL (ref 150–440)
RBC: 3.83 MIL/uL (ref 3.80–5.20)
RDW: 23.2 % — ABNORMAL HIGH (ref 11.5–14.5)
WBC: 5.1 10*3/uL (ref 3.6–11.0)

## 2015-09-10 LAB — COMPREHENSIVE METABOLIC PANEL
ALK PHOS: 52 U/L (ref 38–126)
ALT: 14 U/L (ref 14–54)
ANION GAP: 7 (ref 5–15)
AST: 18 U/L (ref 15–41)
Albumin: 3.2 g/dL — ABNORMAL LOW (ref 3.5–5.0)
BILIRUBIN TOTAL: 0.3 mg/dL (ref 0.3–1.2)
BUN: 9 mg/dL (ref 6–20)
CALCIUM: 8.4 mg/dL — AB (ref 8.9–10.3)
CO2: 23 mmol/L (ref 22–32)
Chloride: 107 mmol/L (ref 101–111)
Creatinine, Ser: 0.67 mg/dL (ref 0.44–1.00)
GFR calc non Af Amer: >60 mL/min (ref >60)
Glucose, Bld: 124 mg/dL — ABNORMAL HIGH (ref 65–99)
Potassium: 3.9 mmol/L (ref 3.5–5.1)
Sodium: 137 mmol/L (ref 135–145)
TOTAL PROTEIN: 6.9 g/dL (ref 6.5–8.1)

## 2015-09-10 MED ORDER — SODIUM CHLORIDE 0.9 % IV SOLN
Freq: Once | INTRAVENOUS | Status: AC
  Administered 2015-09-10: 15:00:00 via INTRAVENOUS
  Filled 2015-09-10: qty 1000

## 2015-09-10 MED ORDER — PACLITAXEL CHEMO INJECTION 300 MG/50ML
75.0000 mg/m2 | Freq: Once | INTRAVENOUS | Status: AC
  Administered 2015-09-10: 198 mg via INTRAVENOUS
  Filled 2015-09-10: qty 33

## 2015-09-10 MED ORDER — HEPARIN SOD (PORK) LOCK FLUSH 100 UNIT/ML IV SOLN
500.0000 [IU] | Freq: Once | INTRAVENOUS | Status: AC | PRN
  Administered 2015-09-10: 500 [IU]
  Filled 2015-09-10: qty 5

## 2015-09-10 MED ORDER — SODIUM CHLORIDE 0.9 % IV SOLN
Freq: Once | INTRAVENOUS | Status: AC
  Administered 2015-09-10: 15:00:00 via INTRAVENOUS
  Filled 2015-09-10: qty 4

## 2015-09-10 MED ORDER — DIPHENHYDRAMINE HCL 50 MG/ML IJ SOLN
50.0000 mg | Freq: Once | INTRAMUSCULAR | Status: AC
  Administered 2015-09-10: 50 mg via INTRAVENOUS
  Filled 2015-09-10: qty 1

## 2015-09-10 MED ORDER — FAMOTIDINE IN NACL 20-0.9 MG/50ML-% IV SOLN
20.0000 mg | Freq: Once | INTRAVENOUS | Status: AC
  Administered 2015-09-10: 20 mg via INTRAVENOUS
  Filled 2015-09-10: qty 50

## 2015-09-10 NOTE — Progress Notes (Signed)
Patient returns to clinic today for consideration of cycle 8 Day 15 Taxol infusion per ACCRU [id number].  Patient states she is doing well. She reports that her numbness and tingling sensation is better in fingers and toes. She states, "When I move my toes, I now having feeling between the toes which I didn't have before". Neuropathy is now a grade 1. CBC and chemistries all within normal parameters to proceed with infusion today. Reviewed PRO-CTCAE phone survey with patient and she states improvement in numbness and tingling in hands and feet. She also states that shPatient returns to clinic today for consideration of cycle 8 Day 8 Taxol infusion per ACCRU [id number].  Patient states she is doing well. She reports that she continues to experience numbness and tingling of the fingertips and toes but that it has not changed since the last visit on 08/27/2015; reviewed with Dr. Oliva Bustard. Neuropathy remains a grade 2. Reviewed CBC and chemistries with Dr. Oliva Bustard; all within acceptable parameters to proceed with infusion today. Reviewed phone PRO-CTCAE phone survey with patient and no changes reported. Patient will return to clinic on September 10, 2015 for labs and Taxol infusion. AE's and attributions below:  Mirian Mo, RN, BSN 09/10/2015 2:41 PM     RIHANA MIKULECKY [id number]  09/10/2015  Adverse Event Log  Study/Protocol: ACCRU [id number] Cycle: cycle 8 Day 15   Event Grade Onset Date Resolved Date Drug Name Attribution Treatment Comments  Alopecia Grade 2 baseline  Taxol Definite none   Lymphedema Grade 2 baseline  Taxol Unrelated none   Fatigue Grade 1 baseline  Taxol Definite none   Neuropathy Grade 1 09/10/15  Taxol Definite Permanent Dose reduction on 08/11/15 Down from grade 2   Weight loss Grade 1 07/28/15  Taxol Unlikely none   Hypoalbuminemia Grade 1 07/28/15  Taxol Unrelated none   Hypocalcemia Grade 1 08/27/15  Taxol Possibly none   Anemia Grade 1 08/07/15  Taxol Possibly none   Mirian Mo, RN,  BSN 09/10/2015 2:41 PM

## 2015-09-23 ENCOUNTER — Ambulatory Visit: Payer: BC Managed Care – PPO | Admitting: Internal Medicine

## 2015-09-23 ENCOUNTER — Other Ambulatory Visit: Payer: BC Managed Care – PPO

## 2015-09-23 ENCOUNTER — Ambulatory Visit: Payer: BC Managed Care – PPO

## 2015-09-23 ENCOUNTER — Inpatient Hospital Stay: Payer: BC Managed Care – PPO

## 2015-09-23 ENCOUNTER — Other Ambulatory Visit: Payer: Self-pay | Admitting: Internal Medicine

## 2015-09-23 ENCOUNTER — Inpatient Hospital Stay (HOSPITAL_BASED_OUTPATIENT_CLINIC_OR_DEPARTMENT_OTHER): Payer: BC Managed Care – PPO | Admitting: Family Medicine

## 2015-09-23 VITALS — BP 162/89 | HR 101 | Temp 98.8°F | Resp 18 | Wt 325.8 lb

## 2015-09-23 DIAGNOSIS — C778 Secondary and unspecified malignant neoplasm of lymph nodes of multiple regions: Secondary | ICD-10-CM

## 2015-09-23 DIAGNOSIS — C7982 Secondary malignant neoplasm of genital organs: Secondary | ICD-10-CM

## 2015-09-23 DIAGNOSIS — C7951 Secondary malignant neoplasm of bone: Principal | ICD-10-CM

## 2015-09-23 DIAGNOSIS — Z006 Encounter for examination for normal comparison and control in clinical research program: Secondary | ICD-10-CM

## 2015-09-23 DIAGNOSIS — C7989 Secondary malignant neoplasm of other specified sites: Secondary | ICD-10-CM | POA: Diagnosis not present

## 2015-09-23 DIAGNOSIS — C50911 Malignant neoplasm of unspecified site of right female breast: Secondary | ICD-10-CM

## 2015-09-23 LAB — COMPREHENSIVE METABOLIC PANEL
ALT: 11 U/L — AB (ref 14–54)
AST: 20 U/L (ref 15–41)
Albumin: 3.3 g/dL — ABNORMAL LOW (ref 3.5–5.0)
Alkaline Phosphatase: 56 U/L (ref 38–126)
Anion gap: 8 (ref 5–15)
BUN: 9 mg/dL (ref 6–20)
CHLORIDE: 106 mmol/L (ref 101–111)
CO2: 23 mmol/L (ref 22–32)
CREATININE: 0.77 mg/dL (ref 0.44–1.00)
Calcium: 8.5 mg/dL — ABNORMAL LOW (ref 8.9–10.3)
Glucose, Bld: 132 mg/dL — ABNORMAL HIGH (ref 65–99)
POTASSIUM: 3.6 mmol/L (ref 3.5–5.1)
Sodium: 137 mmol/L (ref 135–145)
Total Bilirubin: 0.5 mg/dL (ref 0.3–1.2)
Total Protein: 7 g/dL (ref 6.5–8.1)

## 2015-09-23 LAB — CBC WITH DIFFERENTIAL/PLATELET
Basophils Absolute: 0.1 10*3/uL (ref 0–0.1)
Basophils Relative: 1 %
EOS PCT: 2 %
Eosinophils Absolute: 0.1 10*3/uL (ref 0–0.7)
HCT: 34.4 % — ABNORMAL LOW (ref 35.0–47.0)
Hemoglobin: 11.2 g/dL — ABNORMAL LOW (ref 12.0–16.0)
LYMPHS ABS: 1.2 10*3/uL (ref 1.0–3.6)
LYMPHS PCT: 14 %
MCH: 27.4 pg (ref 26.0–34.0)
MCHC: 32.4 g/dL (ref 32.0–36.0)
MCV: 84.5 fL (ref 80.0–100.0)
MONO ABS: 0.9 10*3/uL (ref 0.2–0.9)
MONOS PCT: 10 %
Neutro Abs: 6.3 10*3/uL (ref 1.4–6.5)
Neutrophils Relative %: 73 %
PLATELETS: 331 10*3/uL (ref 150–440)
RBC: 4.07 MIL/uL (ref 3.80–5.20)
RDW: 23.6 % — AB (ref 11.5–14.5)
WBC: 8.6 10*3/uL (ref 3.6–11.0)

## 2015-09-23 MED ORDER — HEPARIN SOD (PORK) LOCK FLUSH 100 UNIT/ML IV SOLN
500.0000 [IU] | Freq: Once | INTRAVENOUS | Status: AC
  Administered 2015-09-23: 500 [IU] via INTRAVENOUS
  Filled 2015-09-23: qty 5

## 2015-09-23 MED ORDER — HEPARIN SOD (PORK) LOCK FLUSH 100 UNIT/ML IV SOLN: 500.0000 [IU] | Freq: Once | INTRAVENOUS | Status: DC | PRN

## 2015-09-23 MED ORDER — SODIUM CHLORIDE 0.9 % IV SOLN
Freq: Once | INTRAVENOUS | Status: AC
  Administered 2015-09-23: 12:00:00 via INTRAVENOUS
  Filled 2015-09-23: qty 4

## 2015-09-23 MED ORDER — SODIUM CHLORIDE 0.9% FLUSH
10.0000 mL | INTRAVENOUS | Status: DC | PRN
  Filled 2015-09-23: qty 10

## 2015-09-23 MED ORDER — SODIUM CHLORIDE 0.9 % IV SOLN
Freq: Once | INTRAVENOUS | Status: AC
  Administered 2015-09-23: 11:00:00 via INTRAVENOUS
  Filled 2015-09-23: qty 1000

## 2015-09-23 MED ORDER — FAMOTIDINE IN NACL 20-0.9 MG/50ML-% IV SOLN
20.0000 mg | Freq: Once | INTRAVENOUS | Status: AC
  Administered 2015-09-23: 20 mg via INTRAVENOUS
  Filled 2015-09-23: qty 50

## 2015-09-23 MED ORDER — PACLITAXEL CHEMO INJECTION 300 MG/50ML
75.0000 mg/m2 | Freq: Once | INTRAVENOUS | Status: AC
  Administered 2015-09-23: 198 mg via INTRAVENOUS
  Filled 2015-09-23: qty 33

## 2015-09-23 MED ORDER — DIPHENHYDRAMINE HCL 50 MG/ML IJ SOLN
50.0000 mg | Freq: Once | INTRAMUSCULAR | Status: AC
  Administered 2015-09-23: 50 mg via INTRAVENOUS
  Filled 2015-09-23: qty 1

## 2015-09-23 NOTE — Progress Notes (Signed)
[facility]  [phone]  [phone]     [name] DOB: 1956-06-22  MR#: [id number]  YOK#:[id number]  Patient Care Team: Kirk Ruths, MD as PCP - General (Internal Medicine)  CHIEF COMPLAINT:  Chief Complaint  Patient presents with  . Breast Cancer    INTERVAL HISTORY:  Patient returns to clinic today for consideration of Cycle 9 Day 1 Taxol infusion per ACCRU [id number]. Patient states she is doing well. She is accompanied by her husband and reports more feeling and sensation in her fingers and toes, "I can feel more in each finger and between my toes, one by one". No changes otherwise. She reports appetite remains good. She has noticed the ulceration of the right breast is oozing less and is smaller. Otherwise she is feeling well and denies any other complaints at this time.  REVIEW OF SYSTEMS:   Review of Systems  Constitutional: Positive for malaise/fatigue. Negative for fever, chills, weight loss and diaphoresis.  HENT: Negative.   Eyes: Negative.   Respiratory: Negative for cough, hemoptysis, sputum production, shortness of breath and wheezing.   Cardiovascular: Negative for chest pain, palpitations, orthopnea, claudication, leg swelling and PND.  Gastrointestinal: Negative for heartburn, nausea, vomiting, abdominal pain, diarrhea, constipation, blood in stool and melena.  Genitourinary: Negative.   Musculoskeletal: Negative.   Skin: Negative.   Neurological: Positive for tingling. Negative for dizziness, focal weakness, seizures and weakness.  Endo/Heme/Allergies: Does not bruise/bleed easily.  Psychiatric/Behavioral: Negative for depression. The patient is not nervous/anxious and does not have insomnia.     As per HPI. Otherwise, a complete review of systems is negatve.  ONCOLOGY HISTORY: Oncology History   1. Breast cancer, T4 N2 M1 ER positive HER-2 negative, stage IV disease of the right breast with metastases to  thoracic spine, right chest wall, adrenal gland. Previously noted long-term bleeding from the wound of right breast associated with cancer. 2. Patient has extensive right arm lymphedema. Patient is limited in range of motion due to lymphedema and pain in right arm. 3. Patient was started on Xgeva and letrozole in January 2015, [name] was started in April 2015. 4. CEA at lowest 7.4, failure of Ibrance and letrozole with steady rise in CEA. Most recent CEA in May 2016 reported as 223.    6.[place] XGEVA FIRST [place] 06/2013 LETRAZOLE 07/12/13  IBRANCE 10/10/13. ibrance 12/27/13..cea lowest at 7.4, but failure of ibrance and letrozole as steady rise since     Breast cancer metastasized to bone ([place])   06/27/2013 Initial Diagnosis Breast cancer metastasized to bone    Breast CA ([place])   01/28/2015 Initial Diagnosis Breast CA    PAST MEDICAL HISTORY: Past Medical History  Diagnosis Date  . Hypertension   . Myocardial infarction ([place])   . Anginal pain ([place])   . Sleep apnea   . Anemia   . Post-menopausal 2006  . Breast cancer ([place])     2014   . Breast cancer metastasized to bone ([place]) 11/21/2014    PAST SURGICAL HISTORY: Past Surgical History  Procedure Laterality Date  . Coronary angioplasty with stent placement N/A   . Hernia repair      umbilical hernia repair  . Portacath placement Right 02/03/2015    Procedure: INSERTION PORT-A-CATH;  Surgeon: [name], MD;  Location: [facility] ORS;  Service: General;  Laterality: Right;    FAMILY HISTORY Family History  Problem Relation Age of Onset  . Atrial fibrillation Sister   . Hypertension Sister  GYNECOLOGIC HISTORY:  No LMP recorded. Patient is postmenopausal.     ADVANCED DIRECTIVES:    HEALTH MAINTENANCE: Social History  Substance Use Topics  . Smoking status: Former Smoker -- 1.00 packs/day    Types: Cigarettes    Quit date: 10/25/2004  . Smokeless tobacco: Never Used     Comment: stopped smoking greater than 7 years ago  .  Alcohol Use: No     Allergies  Allergen Reactions  . No Known Allergies     Current Outpatient Prescriptions  Medication Sig Dispense Refill  . acetaminophen (TYLENOL) 500 MG tablet Take 1,000 mg by mouth every 6 (six) hours as needed.    Marland Kitchen aspirin EC 81 MG tablet Take 81 mg by mouth daily.    . clopidogrel (PLAVIX) 75 MG tablet Take 75 mg by mouth daily.    . [employer] CALCIUM 600+D 600-800 MG-UNIT TABS Take 1 tablet by mouth daily. At noon  3  . fexofenadine (ALLEGRA) 180 MG tablet Take 180 mg by mouth at bedtime.    . furosemide (LASIX) 20 MG tablet Take 20 mg by mouth daily.  1  . lidocaine-prilocaine (EMLA) cream APPLY TOPICALLY AS NEEDED 30 g 2  . lisinopril (PRINIVIL,ZESTRIL) 20 MG tablet Take 20 mg by mouth daily.    . metoprolol succinate (TOPROL-XL) 50 MG 24 hr tablet Take 50 mg by mouth 2 (two) times daily. Take with or immediately following a meal.    . omega-3 acid ethyl esters (LOVAZA) 1 G capsule Take 1 g by mouth at bedtime.    . ondansetron (ZOFRAN) 8 MG tablet Take 1 tablet (8 mg total) by mouth 2 (two) times daily. Start the day after chemo for 2 days. Then take as needed for nausea or vomiting. 30 tablet 1  . PHOSPHA 250 NEUTRAL [id number] MG tablet TAKE 1 TABLET BY MOUTH TWICE DAILY 60 tablet 5  . potassium chloride (K-DUR) 10 MEQ tablet Take 10 mEq by mouth daily.   1  . simvastatin (ZOCOR) 80 MG tablet Take 80 mg by mouth at bedtime.    . [DISCONTINUED] rivaroxaban (XARELTO) 20 MG TABS tablet Take 1 tablet (20 mg total) by mouth daily with supper. To start once finishes Xarelto 31m prescription. 30 tablet 3   No current facility-administered medications for this visit.   Facility-Administered Medications Ordered in Other Visits  Medication Dose Route Frequency Provider Last Rate Last Dose  . famotidine (PEPCID) IVPB 20 mg premix  20 mg Intravenous Once GCammie Sickle MD   20 mg at 09/23/15 1114  . heparin lock flush 100 unit/mL  500 Units Intravenous Once  JForest Gleason MD      . heparin lock flush 100 unit/mL  500 Units Intracatheter Once PRN GCammie Sickle MD      . ondansetron (ZOFRAN) 8 mg, dexamethasone (DECADRON) 10 mg in sodium chloride 0.9 % 50 mL IVPB   Intravenous Once GCammie Sickle MD      . PACLitaxel (TAXOL) 198 mg in dextrose 5 % 250 mL chemo infusion (> 819mm2)  75 mg/m2 (Treatment Plan Actual) Intravenous Once GoCammie SickleMD      . sodium chloride 0.9 % injection 10 mL  10 mL Intracatheter PRN JaForest GleasonMD   10 mL at 07/07/15 0911  . sodium chloride flush (NS) 0.9 % injection 10 mL  10 mL Intravenous PRN JaForest GleasonMD        OBJECTIVE: BP 162/89 mmHg  Pulse 101  Temp(Src)  98.8 F (37.1 C) (Tympanic)  Resp 18  Wt 325 lb 13.4 oz (147.8 kg)   Body mass index is 53.38 kg/(m^2).    ECOG FS:1 - Symptomatic but completely ambulatory  General: Well-developed, well-nourished, no acute distress. Lungs: Clear to auscultation bilaterally. Heart: Regular rate and rhythm. No rubs, murmurs, or gallops. Abdomen: Soft, nontender, nondistended. No organomegaly noted, normoactive bowel sounds. Musculoskeletal: No edema, cyanosis, or clubbing. Neuro: Alert, answering all questions appropriately. Cranial nerves grossly intact. Skin: No rashes or petechiae noted. Psych: Normal affect. Lymphatics: No cervical, clavicular LAD.   LAB RESULTS:  Infusion on 09/23/2015  Component Date Value Ref Range Status  . WBC 09/23/2015 8.6  3.6 - 11.0 K/uL Final  . RBC 09/23/2015 4.07  3.80 - 5.20 MIL/uL Final  . Hemoglobin 09/23/2015 11.2* 12.0 - 16.0 g/dL Final  . HCT 09/23/2015 34.4* 35.0 - 47.0 % Final  . MCV 09/23/2015 84.5  80.0 - 100.0 fL Final  . MCH 09/23/2015 27.4  26.0 - 34.0 pg Final  . MCHC 09/23/2015 32.4  32.0 - 36.0 g/dL Final  . RDW 09/23/2015 23.6* 11.5 - 14.5 % Final  . Platelets 09/23/2015 331  150 - 440 K/uL Final  . Neutrophils Relative % 09/23/2015 73   Final  . Neutro Abs 09/23/2015 6.3  1.4 -  6.5 K/uL Final  . Lymphocytes Relative 09/23/2015 14   Final  . Lymphs Abs 09/23/2015 1.2  1.0 - 3.6 K/uL Final  . Monocytes Relative 09/23/2015 10   Final  . Monocytes Absolute 09/23/2015 0.9  0.2 - 0.9 K/uL Final  . Eosinophils Relative 09/23/2015 2   Final  . Eosinophils Absolute 09/23/2015 0.1  0 - 0.7 K/uL Final  . Basophils Relative 09/23/2015 1   Final  . Basophils Absolute 09/23/2015 0.1  0 - 0.1 K/uL Final  . Sodium 09/23/2015 137  135 - 145 mmol/L Final  . Potassium 09/23/2015 3.6  3.5 - 5.1 mmol/L Final  . Chloride 09/23/2015 106  101 - 111 mmol/L Final  . CO2 09/23/2015 23  22 - 32 mmol/L Final  . Glucose, Bld 09/23/2015 132* 65 - 99 mg/dL Final  . BUN 09/23/2015 9  6 - 20 mg/dL Final  . Creatinine, Ser 09/23/2015 0.77  0.44 - 1.00 mg/dL Final  . Calcium 09/23/2015 8.5* 8.9 - 10.3 mg/dL Final  . Total Protein 09/23/2015 7.0  6.5 - 8.1 g/dL Final  . Albumin 09/23/2015 3.3* 3.5 - 5.0 g/dL Final  . AST 09/23/2015 20  15 - 41 U/L Final  . ALT 09/23/2015 11* 14 - 54 U/L Final  . Alkaline Phosphatase 09/23/2015 56  38 - 126 U/L Final  . Total Bilirubin 09/23/2015 0.5  0.3 - 1.2 mg/dL Final  . GFR calc non Af Amer 09/23/2015 >60  >60 mL/min Final  . GFR calc Af Amer 09/23/2015 >60  >60 mL/min Final   Comment: (NOTE) The eGFR has been calculated using the CKD EPI equation. This calculation has not been validated in all clinical situations. eGFR's persistently <60 mL/min signify possible Chronic Kidney Disease.   . Anion gap 09/23/2015 8  5 - 15 Final    STUDIES: No results found.  ASSESSMENT:  Carcinoma of right breast, stage IV, T4 N2 M1, with mets to bone and adrenals.  PLAN:   1. Right breast cancer. Will proceed with Cycle 9 Day 1 Taxol infusion per ACCRU [id number]. She is tolerating treatment well and since dose reduction of Taxol has started to get sensation  back in her fingers.  Will continue with follow up as mandated by protocol.   Patient expressed  understanding and was in agreement with this plan. She also understands that She can call clinic at any time with any questions, concerns, or complaints.   Dr. Rogue Bussing was available for consultation and review of plan of care for this patient.  Breast cancer metastasized to bone [facility])   Staging form: Breast, AJCC 7th Edition     Clinical stage from 06/27/2013: Stage IV (T4, N2, M1) - Signed by [name], NP on 12/25/2014   [name], NP   09/23/2015 11:27 AM

## 2015-09-23 NOTE — Progress Notes (Cosign Needed)
[name] returns to clinic today for consideration of Cycle 9 Day 1 Taxol infusion per ACCRU [id number]. [name] states she is doing well. She is accompanied by her husband and reports more feeling and sensation in her fingers and toes, "I can feel more in each finger and between my toes, one by one". No changes otherwise. Georgeanne Nim, NP in to see [name] today and review labs with her and her husband, all within acceptable parameters to proceed with Taxol infusion today. Coughing has also improved as reported by [name]. Her labs show a mild hypocalcemia of 8.5mg /dL and hypoalbumineamia continues with a level of 3.3g/dL. Her hemoglobin improved from 10.6g/dL at last visit on 09/10/2015 to 11.2g/dL today. The [name]'s weight has dropped from 335lbs at last visit on 09/10/15 to 325lbs today which results in a decrease of 10lbs. VS are stable today. Reviewed PRO-CTCAE phone survey with [name] and she also completed the EORTC QLQ Questionnaire today. The [name] was scheduled for CT of chest, abdomen and pelvis with contrast and Bone Scan on 10/13/2015 and 10/15/2015. She will return next week on Wednesday September 30, 2015 for labs and Taxol infusion. AE's and attributions below:  Mirian Mo, RN, BSN 09/23/2015 11:36 AM      Adverse Event Log  Study/Protocol: ACCRU [id number] Cycle: Cycle 9 Day 1   Event Grade Onset Date Resolved Date Drug Name Attribution Treatment Comments  Alopecia Grade 2 baseline  Taxol Definite none   Lymphedema Grade 2 baseline  Taxol Unrelated none   Fatigue Grade 1 baseline  Taxol Definite none   Neuropathy Grade 1 09/10/15  Taxol Definite Permanent Dose reduction on 08/11/15 Down from grade 2   Weight loss Grade 1 07/28/15  Taxol Unlikely none   Hypoalbuminemia Grade 1 07/28/15  Taxol Unrelated none   Hypocalcemia Grade 1 08/27/15  Taxol Possibly none   Anemia Grade 1 08/07/15  Taxol Possibly none   Mirian Mo, RN, BSN 09/23/2015 11:39 AM

## 2015-09-30 ENCOUNTER — Inpatient Hospital Stay: Payer: BC Managed Care – PPO

## 2015-09-30 ENCOUNTER — Inpatient Hospital Stay: Payer: BC Managed Care – PPO | Attending: Oncology

## 2015-09-30 ENCOUNTER — Encounter: Payer: Self-pay | Admitting: *Deleted

## 2015-09-30 DIAGNOSIS — R531 Weakness: Secondary | ICD-10-CM | POA: Diagnosis not present

## 2015-09-30 DIAGNOSIS — Z79899 Other long term (current) drug therapy: Secondary | ICD-10-CM | POA: Insufficient documentation

## 2015-09-30 DIAGNOSIS — G629 Polyneuropathy, unspecified: Secondary | ICD-10-CM | POA: Diagnosis not present

## 2015-09-30 DIAGNOSIS — I890 Lymphedema, not elsewhere classified: Secondary | ICD-10-CM | POA: Insufficient documentation

## 2015-09-30 DIAGNOSIS — I252 Old myocardial infarction: Secondary | ICD-10-CM | POA: Diagnosis not present

## 2015-09-30 DIAGNOSIS — E785 Hyperlipidemia, unspecified: Secondary | ICD-10-CM | POA: Insufficient documentation

## 2015-09-30 DIAGNOSIS — C7989 Secondary malignant neoplasm of other specified sites: Secondary | ICD-10-CM | POA: Diagnosis not present

## 2015-09-30 DIAGNOSIS — Z170 Estrogen receptor positive status [ER+]: Secondary | ICD-10-CM | POA: Diagnosis not present

## 2015-09-30 DIAGNOSIS — Z7901 Long term (current) use of anticoagulants: Secondary | ICD-10-CM | POA: Insufficient documentation

## 2015-09-30 DIAGNOSIS — C50911 Malignant neoplasm of unspecified site of right female breast: Secondary | ICD-10-CM | POA: Diagnosis not present

## 2015-09-30 DIAGNOSIS — Z5111 Encounter for antineoplastic chemotherapy: Secondary | ICD-10-CM | POA: Diagnosis not present

## 2015-09-30 DIAGNOSIS — C7982 Secondary malignant neoplasm of genital organs: Secondary | ICD-10-CM | POA: Diagnosis not present

## 2015-09-30 DIAGNOSIS — Z7982 Long term (current) use of aspirin: Secondary | ICD-10-CM | POA: Insufficient documentation

## 2015-09-30 DIAGNOSIS — Z006 Encounter for examination for normal comparison and control in clinical research program: Secondary | ICD-10-CM | POA: Diagnosis not present

## 2015-09-30 DIAGNOSIS — Z955 Presence of coronary angioplasty implant and graft: Secondary | ICD-10-CM | POA: Insufficient documentation

## 2015-09-30 DIAGNOSIS — C7951 Secondary malignant neoplasm of bone: Secondary | ICD-10-CM | POA: Diagnosis not present

## 2015-09-30 DIAGNOSIS — C778 Secondary and unspecified malignant neoplasm of lymph nodes of multiple regions: Secondary | ICD-10-CM | POA: Diagnosis not present

## 2015-09-30 DIAGNOSIS — I10 Essential (primary) hypertension: Secondary | ICD-10-CM | POA: Diagnosis not present

## 2015-09-30 DIAGNOSIS — G4730 Sleep apnea, unspecified: Secondary | ICD-10-CM | POA: Diagnosis not present

## 2015-09-30 DIAGNOSIS — Z780 Asymptomatic menopausal state: Secondary | ICD-10-CM | POA: Diagnosis not present

## 2015-09-30 DIAGNOSIS — Z87891 Personal history of nicotine dependence: Secondary | ICD-10-CM | POA: Insufficient documentation

## 2015-09-30 LAB — CBC WITH DIFFERENTIAL/PLATELET
Basophils Absolute: 0.1 10*3/uL (ref 0–0.1)
Basophils Relative: 1 %
EOS ABS: 0.2 10*3/uL (ref 0–0.7)
EOS PCT: 2 %
HCT: 34.6 % — ABNORMAL LOW (ref 35.0–47.0)
Hemoglobin: 11.2 g/dL — ABNORMAL LOW (ref 12.0–16.0)
LYMPHS ABS: 1.6 10*3/uL (ref 1.0–3.6)
Lymphocytes Relative: 15 %
MCH: 27.5 pg (ref 26.0–34.0)
MCHC: 32.5 g/dL (ref 32.0–36.0)
MCV: 84.7 fL (ref 80.0–100.0)
MONOS PCT: 5 %
Monocytes Absolute: 0.5 10*3/uL (ref 0.2–0.9)
Neutro Abs: 7.9 10*3/uL — ABNORMAL HIGH (ref 1.4–6.5)
Neutrophils Relative %: 77 %
PLATELETS: 325 10*3/uL (ref 150–440)
RBC: 4.08 MIL/uL (ref 3.80–5.20)
RDW: 22.9 % — AB (ref 11.5–14.5)
WBC: 10.3 10*3/uL (ref 3.6–11.0)

## 2015-09-30 LAB — COMPREHENSIVE METABOLIC PANEL
ALT: 13 U/L — AB (ref 14–54)
AST: 20 U/L (ref 15–41)
Albumin: 3.5 g/dL (ref 3.5–5.0)
Alkaline Phosphatase: 46 U/L (ref 38–126)
Anion gap: 7 (ref 5–15)
BUN: 11 mg/dL (ref 6–20)
CHLORIDE: 108 mmol/L (ref 101–111)
CO2: 20 mmol/L — AB (ref 22–32)
CREATININE: 0.73 mg/dL (ref 0.44–1.00)
Calcium: 8.6 mg/dL — ABNORMAL LOW (ref 8.9–10.3)
GFR calc Af Amer: >60 mL/min (ref >60)
Glucose, Bld: 146 mg/dL — ABNORMAL HIGH (ref 65–99)
Potassium: 3.9 mmol/L (ref 3.5–5.1)
Sodium: 135 mmol/L (ref 135–145)
Total Bilirubin: 0.4 mg/dL (ref 0.3–1.2)
Total Protein: 7.3 g/dL (ref 6.5–8.1)

## 2015-09-30 MED ORDER — SODIUM CHLORIDE 0.9 % IV SOLN
Freq: Once | INTRAVENOUS | Status: AC
  Administered 2015-09-30: 12:00:00 via INTRAVENOUS
  Filled 2015-09-30: qty 4

## 2015-09-30 MED ORDER — SODIUM CHLORIDE 0.9 % IV SOLN
Freq: Once | INTRAVENOUS | Status: AC
  Administered 2015-09-30: 12:00:00 via INTRAVENOUS
  Filled 2015-09-30: qty 1000

## 2015-09-30 MED ORDER — FAMOTIDINE IN NACL 20-0.9 MG/50ML-% IV SOLN
20.0000 mg | Freq: Once | INTRAVENOUS | Status: AC
Start: 2015-09-30 — End: 2015-09-30
  Administered 2015-09-30: 20 mg via INTRAVENOUS
  Filled 2015-09-30: qty 50

## 2015-09-30 MED ORDER — PACLITAXEL CHEMO INJECTION 300 MG/50ML
75.0000 mg/m2 | Freq: Once | INTRAVENOUS | Status: AC
  Administered 2015-09-30: 198 mg via INTRAVENOUS
  Filled 2015-09-30: qty 33

## 2015-09-30 MED ORDER — SODIUM CHLORIDE 0.9% FLUSH
10.0000 mL | INTRAVENOUS | Status: DC | PRN
  Administered 2015-09-30: 10 mL via INTRAVENOUS
  Filled 2015-09-30: qty 10

## 2015-09-30 MED ORDER — DIPHENHYDRAMINE HCL 50 MG/ML IJ SOLN
50.0000 mg | Freq: Once | INTRAMUSCULAR | Status: AC
  Administered 2015-09-30: 50 mg via INTRAVENOUS
  Filled 2015-09-30: qty 1

## 2015-09-30 MED ORDER — HEPARIN SOD (PORK) LOCK FLUSH 100 UNIT/ML IV SOLN
500.0000 [IU] | Freq: Once | INTRAVENOUS | Status: AC
  Administered 2015-09-30: 500 [IU] via INTRAVENOUS
  Filled 2015-09-30: qty 5

## 2015-09-30 NOTE — Progress Notes (Signed)
[name] returns to clinic this morning for consideration of cycle 9, day 8 Taxol infusion on the ACCRU [id number] I research study. She has completed her PRO phone survey and this was reviewed with Ms. Surgeon. States she is still having some numbness and tingling in her fingers and occasionally her toes, but still feels like it is a little better than before and denies that it is affecting her ability to perform ADLs. She also reports continued fatigue, but also states this is unchanged from before. Patient deies any new symptoms or problems this morning. She does have questions regarding the date of her bone scan and CT scan. She has one appointment that lists them on the same day, and another has them scheduled on 2 different dates. Mirian Mo, RN verified that the patient is scheduled to have her bone scan on 10/13/15 and her CT scan on 10/15/15. New appointment sheet was printed out for the patient and appointment dates and times reviewed. Lab values assessed to be within acceptable parameters for patient to receive her C9D8 Taxol infusion as scheduled. All other AEs as follows:    Adverse Event Log  Study/Protocol: ACCRU [id number] Cycle: Cycle 9 Day 8  Event Grade Onset Date Resolved Date Drug Name Attribution Treatment Comments  Alopecia Grade 2 baseline  Taxol Definite none   Lymphedema Grade 2 baseline  Taxol Unrelated none   Fatigue Grade 1 baseline  Taxol Definite none   Neuropathy Grade 1 09/10/15  Taxol Definite Permanent Dose reduction on 08/11/15 Down from grade 2   Weight loss Grade 1 07/28/15  Taxol Unlikely none   Hypocalcemia Grade 1 08/27/15  Taxol Possibly none   Anemia Grade 1 08/07/15  Taxol Possibly none         Yolande Jolly, BSN, [place], OCN 09/30/2015 11:36 AM

## 2015-10-07 ENCOUNTER — Other Ambulatory Visit: Payer: Self-pay | Admitting: Internal Medicine

## 2015-10-07 ENCOUNTER — Other Ambulatory Visit: Payer: Self-pay | Admitting: *Deleted

## 2015-10-07 ENCOUNTER — Inpatient Hospital Stay: Payer: BC Managed Care – PPO

## 2015-10-07 VITALS — BP 98/62 | HR 96 | Temp 98.8°F | Resp 18

## 2015-10-07 DIAGNOSIS — C50911 Malignant neoplasm of unspecified site of right female breast: Secondary | ICD-10-CM

## 2015-10-07 DIAGNOSIS — Z5111 Encounter for antineoplastic chemotherapy: Secondary | ICD-10-CM | POA: Diagnosis not present

## 2015-10-07 DIAGNOSIS — C7951 Secondary malignant neoplasm of bone: Principal | ICD-10-CM

## 2015-10-07 LAB — CBC WITH DIFFERENTIAL/PLATELET
BASOS ABS: 0.1 10*3/uL (ref 0–0.1)
Basophils Relative: 2 %
Eosinophils Absolute: 0.2 10*3/uL (ref 0–0.7)
Eosinophils Relative: 4 %
HEMATOCRIT: 33.2 % — AB (ref 35.0–47.0)
HEMOGLOBIN: 11 g/dL — AB (ref 12.0–16.0)
LYMPHS ABS: 1.3 10*3/uL (ref 1.0–3.6)
LYMPHS PCT: 24 %
MCH: 28 pg (ref 26.0–34.0)
MCHC: 33 g/dL (ref 32.0–36.0)
MCV: 84.9 fL (ref 80.0–100.0)
Monocytes Absolute: 0.4 10*3/uL (ref 0.2–0.9)
Monocytes Relative: 6 %
NEUTROS ABS: 3.7 10*3/uL (ref 1.4–6.5)
NEUTROS PCT: 64 %
Platelets: 292 10*3/uL (ref 150–440)
RBC: 3.91 MIL/uL (ref 3.80–5.20)
RDW: 23.3 % — AB (ref 11.5–14.5)
WBC: 5.7 10*3/uL (ref 3.6–11.0)

## 2015-10-07 LAB — COMPREHENSIVE METABOLIC PANEL
ALT: 14 U/L (ref 14–54)
ANION GAP: 7 (ref 5–15)
AST: 21 U/L (ref 15–41)
Albumin: 3.5 g/dL (ref 3.5–5.0)
Alkaline Phosphatase: 45 U/L (ref 38–126)
BILIRUBIN TOTAL: 0.6 mg/dL (ref 0.3–1.2)
BUN: 12 mg/dL (ref 6–20)
CHLORIDE: 107 mmol/L (ref 101–111)
CO2: 21 mmol/L — ABNORMAL LOW (ref 22–32)
Calcium: 8.5 mg/dL — ABNORMAL LOW (ref 8.9–10.3)
Creatinine, Ser: 0.72 mg/dL (ref 0.44–1.00)
GFR calc Af Amer: >60 mL/min (ref >60)
Glucose, Bld: 140 mg/dL — ABNORMAL HIGH (ref 65–99)
POTASSIUM: 3.8 mmol/L (ref 3.5–5.1)
Sodium: 135 mmol/L (ref 135–145)
TOTAL PROTEIN: 6.8 g/dL (ref 6.5–8.1)

## 2015-10-07 MED ORDER — SODIUM CHLORIDE 0.9 % IV SOLN
Freq: Once | INTRAVENOUS | Status: AC
  Administered 2015-10-07: 11:00:00 via INTRAVENOUS
  Filled 2015-10-07: qty 1000

## 2015-10-07 MED ORDER — SODIUM CHLORIDE 0.9% FLUSH
10.0000 mL | INTRAVENOUS | Status: DC | PRN
  Administered 2015-10-07: 10 mL via INTRAVENOUS
  Filled 2015-10-07: qty 10

## 2015-10-07 MED ORDER — DIPHENHYDRAMINE HCL 50 MG/ML IJ SOLN
50.0000 mg | Freq: Once | INTRAMUSCULAR | Status: AC
  Administered 2015-10-07: 50 mg via INTRAVENOUS
  Filled 2015-10-07: qty 1

## 2015-10-07 MED ORDER — SODIUM CHLORIDE 0.9 % IV SOLN
Freq: Once | INTRAVENOUS | Status: AC
  Administered 2015-10-07: 12:00:00 via INTRAVENOUS
  Filled 2015-10-07: qty 4

## 2015-10-07 MED ORDER — HEPARIN SOD (PORK) LOCK FLUSH 100 UNIT/ML IV SOLN
500.0000 [IU] | Freq: Once | INTRAVENOUS | Status: AC
  Administered 2015-10-07: 500 [IU] via INTRAVENOUS
  Filled 2015-10-07: qty 5

## 2015-10-07 MED ORDER — PACLITAXEL CHEMO INJECTION 300 MG/50ML
75.0000 mg/m2 | Freq: Once | INTRAVENOUS | Status: AC
  Administered 2015-10-07: 198 mg via INTRAVENOUS
  Filled 2015-10-07: qty 33

## 2015-10-07 MED ORDER — FAMOTIDINE IN NACL 20-0.9 MG/50ML-% IV SOLN
20.0000 mg | Freq: Once | INTRAVENOUS | Status: AC
  Administered 2015-10-07: 20 mg via INTRAVENOUS
  Filled 2015-10-07: qty 50

## 2015-10-07 NOTE — Progress Notes (Signed)
Patient returns to clinic today for consideration of Cycle 9 Day 15 Taxol infusion per ACCRU [id number]. Patient states she is doing well and no new symptoms to report.  Discussed the fact that her daughter will deliver next Friday and she is very excited to be a new grandmother.  The patient completed her PRO phone survey and this was reviewed with her.  The numbness and tingling she is experiencing in her fingers and toes has leveled off and she states it is not any worse and doesn't affect her ability to perform ADL's. Her bone scan and CT scan are scheduled for next week on different dates and patient is aware of dates and times. Lab values are within acceptable parameters to proceed with today's Taxol infusion as scheduled. All other AE's are as follows:    Adverse Event Log  Study/Protocol: ACCRU [id number] Cycle: Cycle 9 Day 15  Event Grade Onset Date Resolved Date Drug Name Attribution Treatment Comments  Alopecia Grade 2 baseline  Taxol Definite none   Lymphedema Grade 2 baseline  Taxol Unrelated none   Fatigue Grade 1 baseline  Taxol Definite none   Neuropathy Grade 1 09/10/15  Taxol Definite Permanent Dose reduction on 08/11/15 Down from grade 2   Weight loss Grade 1 07/28/15  Taxol Unlikely none   Hypocalcemia Grade 1 08/27/15  Taxol Possibly none   Anemia Grade 1 08/07/15  Taxol Possibly none         Mirian Mo, RN, BSN 10/07/2015 11:33 AM

## 2015-10-09 ENCOUNTER — Encounter: Payer: Self-pay | Admitting: *Deleted

## 2015-10-09 DIAGNOSIS — Z789 Other specified health status: Secondary | ICD-10-CM | POA: Insufficient documentation

## 2015-10-09 DIAGNOSIS — Z9189 Other specified personal risk factors, not elsewhere classified: Secondary | ICD-10-CM

## 2015-10-13 ENCOUNTER — Ambulatory Visit
Admission: RE | Admit: 2015-10-13 | Discharge: 2015-10-13 | Disposition: A | Discharge status: Home / Self Care | Payer: BC Managed Care – PPO | Source: Ambulatory Visit | Attending: Family Medicine | Admitting: Family Medicine

## 2015-10-13 ENCOUNTER — Encounter
Admission: RE | Admit: 2015-10-13 | Discharge: 2015-10-13 | Disposition: A | Discharge status: Home / Self Care | Payer: BC Managed Care – PPO | Source: Ambulatory Visit | Attending: Family Medicine | Admitting: Family Medicine

## 2015-10-13 DIAGNOSIS — C7951 Secondary malignant neoplasm of bone: Secondary | ICD-10-CM | POA: Insufficient documentation

## 2015-10-13 DIAGNOSIS — C50911 Malignant neoplasm of unspecified site of right female breast: Secondary | ICD-10-CM | POA: Diagnosis not present

## 2015-10-13 MED ORDER — TECHNETIUM TC 99M MEDRONATE IV KIT
25.0000 | PACK | Freq: Once | INTRAVENOUS | Status: AC | PRN
  Administered 2015-10-13: 23.32 via INTRAVENOUS

## 2015-10-14 ENCOUNTER — Ambulatory Visit: Payer: BC Managed Care – PPO

## 2015-10-15 ENCOUNTER — Ambulatory Visit
Admission: RE | Admit: 2015-10-15 | Discharge: 2015-10-15 | Disposition: A | Discharge status: Home / Self Care | Payer: BC Managed Care – PPO | Source: Ambulatory Visit | Attending: Family Medicine | Admitting: Family Medicine

## 2015-10-15 DIAGNOSIS — C50911 Malignant neoplasm of unspecified site of right female breast: Secondary | ICD-10-CM | POA: Insufficient documentation

## 2015-10-15 DIAGNOSIS — C7951 Secondary malignant neoplasm of bone: Secondary | ICD-10-CM | POA: Diagnosis present

## 2015-10-15 DIAGNOSIS — E279 Disorder of adrenal gland, unspecified: Secondary | ICD-10-CM | POA: Insufficient documentation

## 2015-10-15 DIAGNOSIS — R160 Hepatomegaly, not elsewhere classified: Secondary | ICD-10-CM | POA: Diagnosis not present

## 2015-10-15 DIAGNOSIS — K5730 Diverticulosis of large intestine without perforation or abscess without bleeding: Secondary | ICD-10-CM | POA: Diagnosis not present

## 2015-10-15 DIAGNOSIS — I700 Atherosclerosis of aorta: Secondary | ICD-10-CM | POA: Diagnosis not present

## 2015-10-15 DIAGNOSIS — C7989 Secondary malignant neoplasm of other specified sites: Secondary | ICD-10-CM | POA: Insufficient documentation

## 2015-10-15 MED ORDER — IOPAMIDOL (ISOVUE-370) INJECTION 76%
125.0000 mL | Freq: Once | INTRAVENOUS | Status: AC | PRN
  Administered 2015-10-15: 125 mL via INTRAVENOUS

## 2015-10-19 ENCOUNTER — Ambulatory Visit: Payer: BC Managed Care – PPO

## 2015-10-21 ENCOUNTER — Inpatient Hospital Stay (HOSPITAL_BASED_OUTPATIENT_CLINIC_OR_DEPARTMENT_OTHER): Payer: BC Managed Care – PPO | Admitting: Oncology

## 2015-10-21 ENCOUNTER — Encounter: Payer: Self-pay | Admitting: Oncology

## 2015-10-21 ENCOUNTER — Inpatient Hospital Stay: Payer: BC Managed Care – PPO

## 2015-10-21 VITALS — BP 136/87 | HR 102 | Temp 98.3°F | Resp 18 | Wt 318.8 lb

## 2015-10-21 DIAGNOSIS — C778 Secondary and unspecified malignant neoplasm of lymph nodes of multiple regions: Secondary | ICD-10-CM

## 2015-10-21 DIAGNOSIS — C7989 Secondary malignant neoplasm of other specified sites: Secondary | ICD-10-CM | POA: Diagnosis not present

## 2015-10-21 DIAGNOSIS — Z006 Encounter for examination for normal comparison and control in clinical research program: Secondary | ICD-10-CM | POA: Diagnosis not present

## 2015-10-21 DIAGNOSIS — C50911 Malignant neoplasm of unspecified site of right female breast: Secondary | ICD-10-CM

## 2015-10-21 DIAGNOSIS — C7951 Secondary malignant neoplasm of bone: Secondary | ICD-10-CM

## 2015-10-21 DIAGNOSIS — Z5111 Encounter for antineoplastic chemotherapy: Secondary | ICD-10-CM | POA: Diagnosis not present

## 2015-10-21 DIAGNOSIS — C7982 Secondary malignant neoplasm of genital organs: Secondary | ICD-10-CM

## 2015-10-21 DIAGNOSIS — Z170 Estrogen receptor positive status [ER+]: Secondary | ICD-10-CM

## 2015-10-21 LAB — CBC WITH DIFFERENTIAL/PLATELET
BASOS ABS: 0.1 10*3/uL (ref 0–0.1)
BASOS PCT: 1 %
EOS ABS: 0.2 10*3/uL (ref 0–0.7)
Eosinophils Relative: 2 %
HCT: 35.8 % (ref 35.0–47.0)
HEMOGLOBIN: 11.6 g/dL — AB (ref 12.0–16.0)
Lymphocytes Relative: 14 %
Lymphs Abs: 1.3 10*3/uL (ref 1.0–3.6)
MCH: 27.3 pg (ref 26.0–34.0)
MCHC: 32.4 g/dL (ref 32.0–36.0)
MCV: 84.5 fL (ref 80.0–100.0)
MONOS PCT: 10 %
Monocytes Absolute: 0.9 10*3/uL (ref 0.2–0.9)
NEUTROS PCT: 73 %
Neutro Abs: 6.6 10*3/uL — ABNORMAL HIGH (ref 1.4–6.5)
Platelets: 340 10*3/uL (ref 150–440)
RBC: 4.24 MIL/uL (ref 3.80–5.20)
RDW: 22.5 % — ABNORMAL HIGH (ref 11.5–14.5)
WBC: 9.1 10*3/uL (ref 3.6–11.0)

## 2015-10-21 LAB — COMPREHENSIVE METABOLIC PANEL
ALK PHOS: 40 U/L (ref 38–126)
ALT: 10 U/L — AB (ref 14–54)
ANION GAP: 14 (ref 5–15)
AST: 17 U/L (ref 15–41)
Albumin: 3.3 g/dL — ABNORMAL LOW (ref 3.5–5.0)
BUN: 10 mg/dL (ref 6–20)
CALCIUM: 8.9 mg/dL (ref 8.9–10.3)
CO2: 19 mmol/L — ABNORMAL LOW (ref 22–32)
CREATININE: 0.75 mg/dL (ref 0.44–1.00)
Chloride: 106 mmol/L (ref 101–111)
Glucose, Bld: 134 mg/dL — ABNORMAL HIGH (ref 65–99)
Potassium: 3.8 mmol/L (ref 3.5–5.1)
Sodium: 139 mmol/L (ref 135–145)
TOTAL PROTEIN: 6.9 g/dL (ref 6.5–8.1)
Total Bilirubin: 0.5 mg/dL (ref 0.3–1.2)

## 2015-10-21 MED ORDER — SODIUM CHLORIDE 0.9% FLUSH
10.0000 mL | INTRAVENOUS | Status: AC | PRN
  Filled 2015-10-21: qty 10

## 2015-10-21 MED ORDER — METRONIDAZOLE 500 MG PO TABS: 500.0000 mg | ORAL_TABLET | Freq: Three times a day (TID) | ORAL | Status: DC

## 2015-10-21 MED ORDER — HEPARIN SOD (PORK) LOCK FLUSH 100 UNIT/ML IV SOLN
500.0000 [IU] | Freq: Once | INTRAVENOUS | Status: AC
  Administered 2015-10-21: 500 [IU] via INTRAVENOUS
  Filled 2015-10-21: qty 5

## 2015-10-21 NOTE — Progress Notes (Signed)
[facility]  [phone]  [phone]     RILLA BUCKMAN DOB: 01/19/1956  MR#: [id number]  MBW#:[id number]  Patient Care Team: Kirk Ruths, MD as PCP - General (Internal Medicine)  CHIEF COMPLAINT:  Chief Complaint  Patient presents with  . Breast Cancer    This 60 y.o. female patient presents to the clinic for follow-up metastatic breast cancer.   Chief Complaint/Problem List: 1. Breast cancer see also 07/08/13 note, initially a T4 N2 M1 ER positive HER-2 negative stage IV disease 2. Anemia iron deficiency attributed to prior bleeding from wound C earlier documents 3. Right arm lymphedema prior cellulitis 4. Right breast nonhealing but stable wound 5. Past history of hypertension hyperlipidemia coronary disease stenting      ONCOLOGY HISTORY: Oncology History   1. 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 Previously noted long-term bleeding from the wound of right breast associated with cancer. 2. Patient has extensive right arm lymphedema. Patient is limited in range of motion due to lymphedema and pain in right arm. 3. Patient was started on Xgeva and letrozole in January 2015, [name] was started in April 2015. 4. CEA at lowest 7.4, failure of Ibrance and letrozole with steady rise in CEA. Most recent CEA in May 2016 reported as 223.    6.[place] XGEVA FIRST [place] 06/2013 LETRAZOLE 07/12/13  IBRANCE 10/10/13. ibrance 12/27/13..cea lowest at 7.4, but failure of ibrance and letrozole as steady rise since      7.Stage IV breast cancer with metastases to bone and adrenal gland.his and has been started on Taxol weekly under the protocol under protocol randomizing between Taxol and   Eribulin. Marland Kitchen  8.Patient had Taxol chemotherapy on protocol but now progressing disease.  (April, 2017) and will be switched to ERIBULIN.  INTERVAL HISTORY:   60 year old lady came today further follow-up regarding stage IV carcinoma of breast.  Since last evaluation sheis getting regular follow-up under protocol with repeat CT scan here to discuss further results and treatment consideration.. Patient has noticed that is more smell coming out of right chest wall wound.  No nausea no vomiting no diarrhea.  Appetite has been fairly good patient is feeling weak and tired.  ROS GENERAL: Feels somewhat weak and tired  Planing of smell from the right breast wound PERFORMANCE STATUS (ECOG):  0 HEENT:  No visual changes, runny nose, sore throat, mouth sores or  Lungs: No shortness of breath or cough.  No hemoptysis. Cardiac:  No chest pain, palpitations, orthopnea, or PND. GI:  No nausea, vomiting, diarrhea, constipation, melena or hematochezia. GU:  No urgency, frequency, dysuria, hematuria has resolvedMusculoskeletal:  No back pain.  No joint pain.  No muscle tenderness. Extremities:  Right upper extremity swelling Skin:  No rashes or skin changes. Neuro: numbness on the tip of the finger and tape of the toes not interfering with any average daily life. Endocrine:  No diabetes, thyroid issues, hot flashes or night sweats. Psych:  No mood changes, depression or anxiety. Pain:  No focal pain. Review of systems:  All other systems reviewed and found to be negative. Breast wound is slowly healing.  Patient is attending wound care center once a month   PAST MEDICAL HISTORY: Past Medical History  Diagnosis Date  . Hypertension   . Myocardial infarction ([place])   . Anginal pain ([place])   . Sleep apnea   .  Anemia   . Post-menopausal 2006  . Last menstrual period (LMP) > 10 days ago 2007  . Breast cancer ([place])     2014   . Breast cancer metastasized to bone ([place]) 11/21/2014    PAST SURGICAL HISTORY: Past Surgical History  Procedure Laterality Date  . Coronary angioplasty with stent placement N/A   . Hernia repair      umbilical hernia repair  .  Portacath placement Right 02/03/2015    Procedure: INSERTION PORT-A-CATH;  Surgeon: [name], MD;  Location: [facility] ORS;  Service: General;  Laterality: Right;    FAMILY HISTORY There is no significant family history of breast cancer, ovarian cancer, colon cancer    ADVANCED DIRECTIVES:  Patient does not have any living will or healthcare power of attorney.  Information was given .  Available resources had been discussed.  We will follow-up on subsequent appointments regarding this issue  HEALTH MAINTENANCE: Social History  Substance Use Topics  . Smoking status: Former Smoker -- 1.00 packs/day    Types: Cigarettes    Quit date: 10/25/2004  . Smokeless tobacco: Never Used     Comment: stopped smoking greater than 7 years ago  . Alcohol Use: No       Allergies  Allergen Reactions  . No Known Allergies     Current Outpatient Prescriptions  Medication Sig Dispense Refill  . acetaminophen (TYLENOL) 500 MG tablet Take 1,000 mg by mouth every 6 (six) hours as needed.    Marland Kitchen aspirin EC 81 MG tablet Take 81 mg by mouth daily.    . clopidogrel (PLAVIX) 75 MG tablet Take 75 mg by mouth daily.    . [employer] CALCIUM 600+D 600-800 MG-UNIT TABS Take 1 tablet by mouth daily. At noon  3  . fexofenadine (ALLEGRA) 180 MG tablet Take 180 mg by mouth at bedtime.    . furosemide (LASIX) 20 MG tablet Take 20 mg by mouth daily.  1  . lidocaine-prilocaine (EMLA) cream APPLY TOPICALLY AS NEEDED 30 g 2  . lisinopril (PRINIVIL,ZESTRIL) 20 MG tablet Take 20 mg by mouth daily.    . metoprolol succinate (TOPROL-XL) 50 MG 24 hr tablet Take 50 mg by mouth 2 (two) times daily. Take with or immediately following a meal.    . omega-3 acid ethyl esters (LOVAZA) 1 G capsule Take 1 g by mouth at bedtime.    Marland Kitchen PHOSPHA 250 NEUTRAL [id number] MG tablet TAKE 1 TABLET BY MOUTH TWICE DAILY 60 tablet 5  . potassium chloride (K-DUR) 10 MEQ tablet Take 10 mEq by mouth daily.   1  . simvastatin (ZOCOR) 80 MG tablet Take  80 mg by mouth at bedtime.    . metroNIDAZOLE (FLAGYL) 500 MG tablet Take 1 tablet (500 mg total) by mouth 3 (three) times daily. 30 tablet 0  . [DISCONTINUED] rivaroxaban (XARELTO) 20 MG TABS tablet Take 1 tablet (20 mg total) by mouth daily with supper. To start once finishes Xarelto 25m prescription. 30 tablet 3   No current facility-administered medications for this visit.   Facility-Administered Medications Ordered in Other Visits  Medication Dose Route Frequency Provider Last Rate Last Dose  . sodium chloride 0.9 % injection 10 mL  10 mL Intracatheter PRN JForest Gleason MD   10 mL at 07/07/15 0911  . sodium chloride flush (NS) 0.9 % injection 10 mL  10 mL Intravenous PRN JForest Gleason MD        OBJECTIVE: BP 136/87 mmHg  Pulse 102  Temp(Src) 98.3  F (36.8 C) (Tympanic)  Resp 18  Wt 318 lb 12.6 oz (144.6 kg)   Body mass index is 52.22 kg/(m^2).    ECOG FS:0 - Asymptomatic  General: Well-developed, well-nourished, no acute distress. Eyes: Pink conjunctiva, anicteric sclera. HEENT: Normocephalic, moist mucous membranes, clear oropharnyx. Lungs: Clear to auscultation bilaterally. Heart: Regular rate and rhythm. No rubs, murmurs, or gallops. Abdomen: Soft, nontender, nondistended. No organomegaly noted, normoactive bowel sounds..  Right breast: Palpable mass open wound probably infected lymphedema right upper extremity Musculoskeletal: No edema, cyanosis, or clubbing. Neuro: Alert, answering all questions appropriately. Cranial nerves grossly intact. Skin: No rashes or petechiae noted. Psych: Normal affect.Lower extremity swelling present Lymphatics: No cervical, calvicular, axillary or inguinal LAD.  Right upper extremity lymphedema is gradually getting better.  Is grade 3 lymphedema. Right breast: In duration persist however open wound is gradually healing.  There are multiple lady is a bleeding.  Right breast mass is gradually decreasing.  Wound size is now 9 x 4.5 cm and is  very superficial LAB RESULTS:                          Infusion on 10/21/2015  Component Date Value Ref Range Status  . Sodium 10/21/2015 139  135 - 145 mmol/L Final  . Potassium 10/21/2015 3.8  3.5 - 5.1 mmol/L Final  . Chloride 10/21/2015 106  101 - 111 mmol/L Final  . CO2 10/21/2015 19* 22 - 32 mmol/L Final  . Glucose, Bld 10/21/2015 134* 65 - 99 mg/dL Final  . BUN 10/21/2015 10  6 - 20 mg/dL Final  . Creatinine, Ser 10/21/2015 0.75  0.44 - 1.00 mg/dL Final  . Calcium 10/21/2015 8.9  8.9 - 10.3 mg/dL Final  . Total Protein 10/21/2015 6.9  6.5 - 8.1 g/dL Final  . Albumin 10/21/2015 3.3* 3.5 - 5.0 g/dL Final  . AST 10/21/2015 17  15 - 41 U/L Final  . ALT 10/21/2015 10* 14 - 54 U/L Final  . Alkaline Phosphatase 10/21/2015 40  38 - 126 U/L Final  . Total Bilirubin 10/21/2015 0.5  0.3 - 1.2 mg/dL Final  . GFR calc non Af Amer 10/21/2015 >60  >60 mL/min Final  . GFR calc Af Amer 10/21/2015 >60  >60 mL/min Final   Comment: (NOTE) The eGFR has been calculated using the CKD EPI equation. This calculation has not been validated in all clinical situations. eGFR's persistently <60 mL/min signify possible Chronic Kidney Disease.   . Anion gap 10/21/2015 14  5 - 15 Final  . WBC 10/21/2015 9.1  3.6 - 11.0 K/uL Final  . RBC 10/21/2015 4.24  3.80 - 5.20 MIL/uL Final  . Hemoglobin 10/21/2015 11.6* 12.0 - 16.0 g/dL Final  . HCT 10/21/2015 35.8  35.0 - 47.0 % Final  . MCV 10/21/2015 84.5  80.0 - 100.0 fL Final  . MCH 10/21/2015 27.3  26.0 - 34.0 pg Final  . MCHC 10/21/2015 32.4  32.0 - 36.0 g/dL Final  . RDW 10/21/2015 22.5* 11.5 - 14.5 % Final  . Platelets 10/21/2015 340  150 - 440 K/uL Final  . Neutrophils Relative % 10/21/2015 73   Final  . Neutro Abs 10/21/2015 6.6* 1.4 - 6.5 K/uL Final  . Lymphocytes Relative 10/21/2015 14   Final  . Lymphs Abs 10/21/2015 1.3  1.0 - 3.6 K/uL Final  . Monocytes Relative 10/21/2015 10   Final  . Monocytes Absolute 10/21/2015 0.9  0.2 - 0.9 K/uL  Final  .  Eosinophils Relative 10/21/2015 2   Final  . Eosinophils Absolute 10/21/2015 0.2  0 - 0.7 K/uL Final  . Basophils Relative 10/21/2015 1   Final  . Basophils Absolute 10/21/2015 0.1  0 - 0.1 K/uL Final   CT scan of the chest and abdomen has been reviewed. All the lesions were measured. CT scan and bone scan has been reviewed independently. Julie's in his left upper lobe infiltrate which appears to be inflammatory with air bronchogram Bone scan has been reported to be completely negative has been reviewed independently  ASSESSMENT:  Stage IV carcinoma of breast CT scan has been reviewed shows progressive disease. CT scan has been reviewed independently and reviewed with the patient. Retroperitoneal lymph nodes are slightly enlarged  At this point in time will consider possibility of ERIBULIN treatment I discussed that in detail with the patient and family. Intent of chemotherapy is palliation and relief in symptoms and extending survival All the side effects of chemotherapy including myelosuppression, alopecia, nausea vomiting fatigue weakness.  Secondary infection, and   peripheral neuropathy .  Has been discussed in details. Informal consent has been obtained and will be documented by nurses in the chart  All other options are available and have been discussed in detail with the patient and family.  Nurse navigator also have been informed about progressing disease in taking patient off chemotherapy with Taxol and PROTOCOL Proceed with ERIBULIN if there is any progressive disease on ERIBULIN then possibility of Adriamycin containing regimen can be tried. Total duration of visit was 45 minutes.  50% or more time was spent in counseling patient and family regarding prognosis and options of treatment and available resources seling patient and family regarding prognosis and options of treatment and available resources. Reviewing scan Assessment    Of  tumor And management of new  onset of thrombus Forest Gleason, MD   10/21/2015 1:29 PM

## 2015-10-21 NOTE — Progress Notes (Signed)
Patient returns today for consideration of Cycle 10 Day 1 Taxol infusion per ACCRU [id number].  Per RECIST 1.1 criteria, patient has progressive disease based on new measurable lymph nodes in the abdomen. Dr. Oliva Bustard in to examine the patient and discuss results of CT and bone scan completed on 10/13/2015 and 10/15/2015.  Dr. Oliva Bustard explained to the patient that several lymph nodes in the abdomen have increased to the point that shows minimal progression of disease. He demonstrated this by showing her the CT scan from 10/13/2015. Dr. Oliva Bustard also performed a physical exam and addressed the wound on the left breast which now measures 4.5cm X 4.0 cm.  The wound is draining and has an odor today; Dr. Oliva Bustard prescribed Flagyl 500mg  tid X 10 days for infection.  It was discussed with the patient and her husband the recommendation to change treatment from Taxol to Eribulin beginning next week. The new treatment schedule was discussed and planned with the patient and she was allowed to ask questions. Reviewed PRO CTCAE phone survey with patient and she completed the EORTC-QLQ Questionnaire.  She reports increased numbness and tingling in fingers but denies worsening in the toes.  She will return next week on Oct 28, 2015 to begin first cycle of Eribulin and will continue in follow up for the ACCRU [id number] study. Mirian Mo, RN, BSN 10/21/2015 11:50 PM    Adverse Event Log  Study/Protocol: ACCRU [id number] Cycle: Cycle 10 Day 1  Event Grade Onset Date Resolved Date Drug Name Attribution Treatment Comments  Alopecia Grade 2 baseline  Taxol Definite none   Lymphedema Grade 2 baseline  Taxol Unrelated none   Fatigue Grade 1 baseline  Taxol Definite none   Neuropathy Grade 1 09/10/15  Taxol Definite Permanent Dose reduction on 08/11/15 Down from grade 2   Weight loss Grade 1 07/28/15  Taxol Unlikely none   Hypocalcemia Grade 1 08/27/15 10/21/2015 Taxol Possibly none    Anemia Grade 1 08/07/15  Taxol Possibly none         Mirian Mo, RN, BSN 10/21/2015  11:50 PM

## 2015-10-28 ENCOUNTER — Inpatient Hospital Stay: Payer: BC Managed Care – PPO | Attending: Oncology

## 2015-10-28 ENCOUNTER — Ambulatory Visit: Payer: BC Managed Care – PPO | Admitting: Family Medicine

## 2015-10-28 ENCOUNTER — Inpatient Hospital Stay: Payer: BC Managed Care – PPO

## 2015-10-28 DIAGNOSIS — I10 Essential (primary) hypertension: Secondary | ICD-10-CM | POA: Diagnosis not present

## 2015-10-28 DIAGNOSIS — I890 Lymphedema, not elsewhere classified: Secondary | ICD-10-CM | POA: Diagnosis not present

## 2015-10-28 DIAGNOSIS — I252 Old myocardial infarction: Secondary | ICD-10-CM | POA: Diagnosis not present

## 2015-10-28 DIAGNOSIS — C7951 Secondary malignant neoplasm of bone: Secondary | ICD-10-CM | POA: Insufficient documentation

## 2015-10-28 DIAGNOSIS — Z79899 Other long term (current) drug therapy: Secondary | ICD-10-CM | POA: Insufficient documentation

## 2015-10-28 DIAGNOSIS — C50911 Malignant neoplasm of unspecified site of right female breast: Secondary | ICD-10-CM

## 2015-10-28 DIAGNOSIS — G4730 Sleep apnea, unspecified: Secondary | ICD-10-CM | POA: Diagnosis not present

## 2015-10-28 DIAGNOSIS — R531 Weakness: Secondary | ICD-10-CM | POA: Insufficient documentation

## 2015-10-28 DIAGNOSIS — Z7982 Long term (current) use of aspirin: Secondary | ICD-10-CM | POA: Insufficient documentation

## 2015-10-28 DIAGNOSIS — C7970 Secondary malignant neoplasm of unspecified adrenal gland: Secondary | ICD-10-CM | POA: Insufficient documentation

## 2015-10-28 DIAGNOSIS — C7989 Secondary malignant neoplasm of other specified sites: Secondary | ICD-10-CM | POA: Diagnosis not present

## 2015-10-28 DIAGNOSIS — Z87891 Personal history of nicotine dependence: Secondary | ICD-10-CM | POA: Insufficient documentation

## 2015-10-28 DIAGNOSIS — Z9221 Personal history of antineoplastic chemotherapy: Secondary | ICD-10-CM | POA: Diagnosis not present

## 2015-10-28 DIAGNOSIS — R5383 Other fatigue: Secondary | ICD-10-CM | POA: Diagnosis not present

## 2015-10-28 DIAGNOSIS — D649 Anemia, unspecified: Secondary | ICD-10-CM | POA: Insufficient documentation

## 2015-10-28 DIAGNOSIS — R970 Elevated carcinoembryonic antigen [CEA]: Secondary | ICD-10-CM | POA: Insufficient documentation

## 2015-10-28 DIAGNOSIS — Z170 Estrogen receptor positive status [ER+]: Secondary | ICD-10-CM | POA: Diagnosis not present

## 2015-10-28 MED ORDER — SODIUM CHLORIDE 0.9% FLUSH
10.0000 mL | INTRAVENOUS | Status: DC | PRN
  Administered 2015-10-28: 10 mL via INTRAVENOUS
  Filled 2015-10-28: qty 10

## 2015-10-28 MED ORDER — INV-ERIBULIN CHEMO INJECTION 1 MG/2ML RU011201I ACCRU: 1.4000 mg/m2 | Freq: Once | INTRAVENOUS | Status: DC

## 2015-10-28 MED ORDER — SODIUM CHLORIDE 0.9 % IV SOLN
Freq: Once | INTRAVENOUS | Status: AC
  Administered 2015-10-28: 11:00:00 via INTRAVENOUS
  Filled 2015-10-28: qty 4

## 2015-10-28 MED ORDER — SODIUM CHLORIDE 0.9 % IV SOLN
1.4000 mg/m2 | Freq: Once | INTRAVENOUS | Status: AC
  Administered 2015-10-28: 3.65 mg via INTRAVENOUS
  Filled 2015-10-28: qty 7.3

## 2015-10-28 MED ORDER — HEPARIN SOD (PORK) LOCK FLUSH 100 UNIT/ML IV SOLN
500.0000 [IU] | Freq: Once | INTRAVENOUS | Status: AC
  Administered 2015-10-28: 500 [IU] via INTRAVENOUS
  Filled 2015-10-28: qty 5

## 2015-10-28 MED ORDER — SODIUM CHLORIDE 0.9 % IV SOLN
Freq: Once | INTRAVENOUS | Status: AC
  Administered 2015-10-28: 11:00:00 via INTRAVENOUS
  Filled 2015-10-28: qty 1000

## 2015-11-04 ENCOUNTER — Inpatient Hospital Stay: Payer: BC Managed Care – PPO

## 2015-11-04 VITALS — BP 133/81 | HR 96 | Temp 97.2°F | Resp 18

## 2015-11-04 DIAGNOSIS — C7951 Secondary malignant neoplasm of bone: Principal | ICD-10-CM

## 2015-11-04 DIAGNOSIS — C50911 Malignant neoplasm of unspecified site of right female breast: Secondary | ICD-10-CM | POA: Diagnosis not present

## 2015-11-04 LAB — CBC WITH DIFFERENTIAL/PLATELET
BASOS PCT: 1 %
Basophils Absolute: 0.1 10*3/uL (ref 0–0.1)
EOS ABS: 0.1 10*3/uL (ref 0–0.7)
EOS PCT: 1 %
HCT: 34 % — ABNORMAL LOW (ref 35.0–47.0)
HEMOGLOBIN: 11 g/dL — AB (ref 12.0–16.0)
LYMPHS ABS: 1.3 10*3/uL (ref 1.0–3.6)
Lymphocytes Relative: 19 %
MCH: 27.2 pg (ref 26.0–34.0)
MCHC: 32.5 g/dL (ref 32.0–36.0)
MCV: 84 fL (ref 80.0–100.0)
MONO ABS: 0.4 10*3/uL (ref 0.2–0.9)
MONOS PCT: 6 %
Neutro Abs: 5.2 10*3/uL (ref 1.4–6.5)
Neutrophils Relative %: 73 %
PLATELETS: 295 10*3/uL (ref 150–440)
RBC: 4.05 MIL/uL (ref 3.80–5.20)
RDW: 22 % — AB (ref 11.5–14.5)
WBC: 7.1 10*3/uL (ref 3.6–11.0)

## 2015-11-04 MED ORDER — PROCHLORPERAZINE MALEATE 10 MG PO TABS
10.0000 mg | ORAL_TABLET | Freq: Once | ORAL | Status: AC
  Administered 2015-11-04: 10 mg via ORAL
  Filled 2015-11-04: qty 1

## 2015-11-04 MED ORDER — SODIUM CHLORIDE 0.9 % IV SOLN
Freq: Once | INTRAVENOUS | Status: AC
  Administered 2015-11-04: 11:00:00 via INTRAVENOUS
  Filled 2015-11-04: qty 1000

## 2015-11-04 MED ORDER — HEPARIN SOD (PORK) LOCK FLUSH 100 UNIT/ML IV SOLN
500.0000 [IU] | Freq: Once | INTRAVENOUS | Status: AC | PRN
  Administered 2015-11-04: 500 [IU]
  Filled 2015-11-04: qty 5

## 2015-11-04 MED ORDER — SODIUM CHLORIDE 0.9% FLUSH
10.0000 mL | INTRAVENOUS | Status: DC | PRN
  Administered 2015-11-04: 10 mL
  Filled 2015-11-04: qty 10

## 2015-11-04 MED ORDER — ERIBULIN MESYLATE CHEMO INJECTION 1 MG/2ML
1.4000 mg/m2 | Freq: Once | INTRAVENOUS | Status: AC
  Administered 2015-11-04: 3.65 mg via INTRAVENOUS
  Filled 2015-11-04: qty 7.3

## 2015-11-04 NOTE — Progress Notes (Signed)
Patient returned to clinic today for Eribulin Cycle 1 Day 8. Mirian Mo, RN research nurse,  met with patient today to follow up on residual side effects/AE's from Taxol. As of October 21, 2015 patient is off protocol for the [facility] [id number] research study but remains in follow up. The patient reports her legs feel heavy and walking "feels funny", grade 1. She reports that she continues to have numbness in her toes and tingling in her fingertips which is unchanged and remains grade 1.  With the birth of her first grandchild last week, she is very excited about becoming a new grandmother.  The patient states she is willing to continue to be followed for the study. Her next appointment in research follow up will be in July 2017.   Mirian Mo, RN, BSN Nov 04, 2015    Adverse Event Log  Study/Protocol: [facility] [id number]  Event Grade Onset Date Resolved Date Drug Name Attribution Treatment Comments  Alopecia Grade 2 baseline  Taxol Definite none   Lymphedema Grade 2 baseline  Taxol Unrelated none   Fatigue Grade 1 baseline  Taxol Definite none   Neuropathy Grade 1 09/10/15  Taxol Definite Permanent Dose reduction on 08/11/15 Down from grade 2   Weight loss Grade 1 07/28/15  Taxol Unlikely none   Hypocalcemia Grade 1 08/27/15 10/21/2015 Taxol Possibly none   Anemia Grade 1 08/07/15  Taxol Possibly none         Mirian Mo, RN, BSN Nov 04, 2015

## 2015-11-18 ENCOUNTER — Inpatient Hospital Stay: Payer: BC Managed Care – PPO

## 2015-11-18 ENCOUNTER — Encounter: Payer: Self-pay | Admitting: Oncology

## 2015-11-18 ENCOUNTER — Inpatient Hospital Stay (HOSPITAL_BASED_OUTPATIENT_CLINIC_OR_DEPARTMENT_OTHER): Payer: BC Managed Care – PPO | Admitting: Oncology

## 2015-11-18 VITALS — BP 145/83 | HR 109 | Temp 97.6°F | Resp 18 | Wt 314.4 lb

## 2015-11-18 DIAGNOSIS — R5383 Other fatigue: Secondary | ICD-10-CM

## 2015-11-18 DIAGNOSIS — C7989 Secondary malignant neoplasm of other specified sites: Secondary | ICD-10-CM | POA: Diagnosis not present

## 2015-11-18 DIAGNOSIS — I252 Old myocardial infarction: Secondary | ICD-10-CM

## 2015-11-18 DIAGNOSIS — Z87891 Personal history of nicotine dependence: Secondary | ICD-10-CM

## 2015-11-18 DIAGNOSIS — C50911 Malignant neoplasm of unspecified site of right female breast: Secondary | ICD-10-CM

## 2015-11-18 DIAGNOSIS — R531 Weakness: Secondary | ICD-10-CM

## 2015-11-18 DIAGNOSIS — C7951 Secondary malignant neoplasm of bone: Secondary | ICD-10-CM

## 2015-11-18 DIAGNOSIS — G4730 Sleep apnea, unspecified: Secondary | ICD-10-CM

## 2015-11-18 DIAGNOSIS — I890 Lymphedema, not elsewhere classified: Secondary | ICD-10-CM

## 2015-11-18 DIAGNOSIS — C7970 Secondary malignant neoplasm of unspecified adrenal gland: Secondary | ICD-10-CM

## 2015-11-18 DIAGNOSIS — R970 Elevated carcinoembryonic antigen [CEA]: Secondary | ICD-10-CM

## 2015-11-18 DIAGNOSIS — Z7982 Long term (current) use of aspirin: Secondary | ICD-10-CM

## 2015-11-18 DIAGNOSIS — Z170 Estrogen receptor positive status [ER+]: Secondary | ICD-10-CM

## 2015-11-18 DIAGNOSIS — Z9221 Personal history of antineoplastic chemotherapy: Secondary | ICD-10-CM

## 2015-11-18 DIAGNOSIS — D649 Anemia, unspecified: Secondary | ICD-10-CM

## 2015-11-18 DIAGNOSIS — I10 Essential (primary) hypertension: Secondary | ICD-10-CM

## 2015-11-18 LAB — COMPREHENSIVE METABOLIC PANEL
ALBUMIN: 3.5 g/dL (ref 3.5–5.0)
ALK PHOS: 47 U/L (ref 38–126)
ALT: 11 U/L — AB (ref 14–54)
AST: 25 U/L (ref 15–41)
Anion gap: 9 (ref 5–15)
BILIRUBIN TOTAL: 0.4 mg/dL (ref 0.3–1.2)
BUN: 10 mg/dL (ref 6–20)
CALCIUM: 8.9 mg/dL (ref 8.9–10.3)
CO2: 22 mmol/L (ref 22–32)
CREATININE: 0.72 mg/dL (ref 0.44–1.00)
Chloride: 107 mmol/L (ref 101–111)
Glucose, Bld: 147 mg/dL — ABNORMAL HIGH (ref 65–99)
Potassium: 3.8 mmol/L (ref 3.5–5.1)
SODIUM: 138 mmol/L (ref 135–145)
TOTAL PROTEIN: 7.4 g/dL (ref 6.5–8.1)

## 2015-11-18 LAB — CBC WITH DIFFERENTIAL/PLATELET
Basophils Absolute: 0.1 10*3/uL (ref 0–0.1)
Basophils Relative: 1 %
EOS ABS: 0 10*3/uL (ref 0–0.7)
Eosinophils Relative: 0 %
HEMATOCRIT: 36 % (ref 35.0–47.0)
HEMOGLOBIN: 11.7 g/dL — AB (ref 12.0–16.0)
LYMPHS ABS: 1.5 10*3/uL (ref 1.0–3.6)
Lymphocytes Relative: 14 %
MCH: 26.9 pg (ref 26.0–34.0)
MCHC: 32.4 g/dL (ref 32.0–36.0)
MCV: 82.8 fL (ref 80.0–100.0)
MONOS PCT: 7 %
Monocytes Absolute: 0.7 10*3/uL (ref 0.2–0.9)
NEUTROS PCT: 78 %
Neutro Abs: 7.9 10*3/uL — ABNORMAL HIGH (ref 1.4–6.5)
Platelets: 329 10*3/uL (ref 150–440)
RBC: 4.34 MIL/uL (ref 3.80–5.20)
RDW: 22 % — ABNORMAL HIGH (ref 11.5–14.5)
WBC: 10.1 10*3/uL (ref 3.6–11.0)

## 2015-11-18 MED ORDER — SODIUM CHLORIDE 0.9 % IV SOLN
1.4000 mg/m2 | Freq: Once | INTRAVENOUS | Status: AC
  Administered 2015-11-18: 3.6 mg via INTRAVENOUS
  Filled 2015-11-18: qty 7.2

## 2015-11-18 MED ORDER — SODIUM CHLORIDE 0.9% FLUSH
10.0000 mL | INTRAVENOUS | Status: DC | PRN
  Administered 2015-11-18: 10 mL via INTRAVENOUS
  Filled 2015-11-18: qty 10

## 2015-11-18 MED ORDER — SODIUM CHLORIDE 0.9 % IV SOLN
Freq: Once | INTRAVENOUS | Status: AC
  Administered 2015-11-18: 11:00:00 via INTRAVENOUS
  Filled 2015-11-18: qty 1000

## 2015-11-18 MED ORDER — SODIUM CHLORIDE 0.9 % IV SOLN
Freq: Once | INTRAVENOUS | Status: AC
  Administered 2015-11-18: 11:00:00 via INTRAVENOUS
  Filled 2015-11-18: qty 0.4

## 2015-11-18 MED ORDER — HEPARIN SOD (PORK) LOCK FLUSH 100 UNIT/ML IV SOLN
500.0000 [IU] | Freq: Once | INTRAVENOUS | Status: AC
  Administered 2015-11-18: 500 [IU] via INTRAVENOUS
  Filled 2015-11-18: qty 5

## 2015-11-18 MED ORDER — PROCHLORPERAZINE MALEATE 10 MG PO TABS
10.0000 mg | ORAL_TABLET | Freq: Once | ORAL | Status: AC
Start: 2015-11-18 — End: 2015-11-18
  Administered 2015-11-18: 10 mg via ORAL
  Filled 2015-11-18: qty 1

## 2015-11-18 NOTE — Progress Notes (Signed)
Patient states she is having neuropathy in her feet and hands. Also c/o weakness in her legs.  Needs prescription for bandages for wound.

## 2015-11-18 NOTE — Progress Notes (Signed)
[place]  [phone]  Fax:(336) [phone]     [name] DOB: 1956-05-22  MR#: [id number]  QIO#:[id number]  Patient Care Team: Kirk Ruths, MD as PCP - General (Internal Medicine)  CHIEF COMPLAINT:  Chief Complaint  Patient presents with  . Breast Cancer    This 60 y.o. female patient presents to the clinic for follow-up metastatic breast cancer.   Chief Complaint/Problem List: 1. Breast cancer see also 07/08/13 note, initially a T4 N2 M1 ER positive HER-2 negative stage IV disease 2. Anemia iron deficiency attributed to prior bleeding from wound C earlier documents 3. Right arm lymphedema prior cellulitis 4. Right breast nonhealing but stable wound 5. Past history of hypertension hyperlipidemia coronary disease stenting      ONCOLOGY HISTORY: Oncology History   1. 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 Previously noted long-term bleeding from the wound of right breast associated with cancer. 2. Patient has extensive right arm lymphedema. Patient is limited in range of motion due to lymphedema and pain in right arm. 3. Patient was started on Xgeva and letrozole in January 2015, [name] was started in April 2015. 4. CEA at lowest 7.4, failure of Ibrance and letrozole with steady rise in CEA. Most recent CEA in May 2016 reported as 223.    6.TX XGEVA FIRST [place] 06/2013 LETRAZOLE 07/12/13  IBRANCE 10/10/13. ibrance 12/27/13..cea lowest at 7.4, but failure of ibrance and letrozole as steady rise since      7.Stage IV breast cancer with metastases to bone and adrenal gland.his and has been started on Taxol weekly under the protocol under protocol randomizing between Taxol and   Eribulin. Marland Kitchen  8.Patient had Taxol chemotherapy on protocol but now progressing disease.  (April, 2017) and will be switched to ERIBULIN.  INTERVAL HISTORY:   60 year old lady came today further follow-up regarding stage IV carcinoma of breast.  Since last evaluation sheis getting regular follow-up under protocol with repeat CT scan here to discuss further results and treatment consideration.. Patient has noticed that is more smell coming out of right chest wall wound.  No nausea no vomiting no diarrhea.  Appetite has been fairly good patient is feeling weak and tired.  ROS GENERAL: Feels somewhat weak and tired  Planing of smell from the right breast wound PERFORMANCE STATUS (ECOG):  0 HEENT:  No visual changes, runny nose, sore throat, mouth sores or  Lungs: No shortness of breath or cough.  No hemoptysis. Cardiac:  No chest pain, palpitations, orthopnea, or PND. GI:  No nausea, vomiting, diarrhea, constipation, melena or hematochezia. GU:  No urgency, frequency, dysuria, hematuria has resolvedMusculoskeletal:  No back pain.  No joint pain.  No muscle tenderness. Extremities:  Right upper extremity swelling Skin:  No rashes or skin changes. Neuro: numbness on the tip of the finger and tape of the toes not interfering with any average daily life. Endocrine:  No diabetes, thyroid issues, hot flashes or night sweats. Psych:  No mood changes, depression or anxiety. Pain:  No focal pain. Review of systems:  All other systems reviewed and found to be negative. Breast wound is slowly healing.  Patient is attending wound care center once a month   PAST MEDICAL HISTORY: Past Medical History  Diagnosis Date  . Hypertension   . Myocardial infarction ([place])   . Anginal pain ([place])   . Sleep apnea   .  Anemia   . Post-menopausal 2006  . Last menstrual period (LMP) > 10 days ago 2007  . Breast cancer ([place])     2014   . Breast cancer metastasized to bone ([place]) 11/21/2014    PAST SURGICAL HISTORY: Past Surgical History  Procedure Laterality Date  . Coronary angioplasty with stent placement N/A   . Hernia repair      umbilical hernia repair  .  Portacath placement Right 02/03/2015    Procedure: INSERTION PORT-A-CATH;  Surgeon: [name], MD;  Location: [facility] ORS;  Service: General;  Laterality: Right;    FAMILY HISTORY There is no significant family history of breast cancer, ovarian cancer, colon cancer    ADVANCED DIRECTIVES:  Patient does not have any living will or healthcare power of attorney.  Information was given .  Available resources had been discussed.  We will follow-up on subsequent appointments regarding this issue  HEALTH MAINTENANCE: Social History  Substance Use Topics  . Smoking status: Former Smoker -- 1.00 packs/day    Types: Cigarettes    Quit date: 10/25/2004  . Smokeless tobacco: Never Used     Comment: stopped smoking greater than 7 years ago  . Alcohol Use: No       Allergies  Allergen Reactions  . No Known Allergies     Current Outpatient Prescriptions  Medication Sig Dispense Refill  . acetaminophen (TYLENOL) 500 MG tablet Take 1,000 mg by mouth every 6 (six) hours as needed.    Marland Kitchen aspirin EC 81 MG tablet Take 81 mg by mouth daily.    . clopidogrel (PLAVIX) 75 MG tablet Take 75 mg by mouth daily.    . [employer] CALCIUM 600+D 600-800 MG-UNIT TABS Take 1 tablet by mouth daily. At noon  3  . fexofenadine (ALLEGRA) 180 MG tablet Take 180 mg by mouth at bedtime.    . furosemide (LASIX) 20 MG tablet Take 20 mg by mouth daily.  1  . lidocaine-prilocaine (EMLA) cream APPLY TOPICALLY AS NEEDED 30 g 2  . lisinopril (PRINIVIL,ZESTRIL) 20 MG tablet Take 20 mg by mouth daily.    . metoprolol succinate (TOPROL-XL) 50 MG 24 hr tablet Take 50 mg by mouth 2 (two) times daily. Take with or immediately following a meal.    . metroNIDAZOLE (FLAGYL) 500 MG tablet Take 1 tablet (500 mg total) by mouth 3 (three) times daily. 30 tablet 0  . omega-3 acid ethyl esters (LOVAZA) 1 G capsule Take 1 g by mouth at bedtime.    Marland Kitchen PHOSPHA 250 NEUTRAL 155-852-130 MG tablet TAKE 1 TABLET BY MOUTH TWICE DAILY 60 tablet 5  .  potassium chloride (K-DUR) 10 MEQ tablet Take 10 mEq by mouth daily.   1  . simvastatin (ZOCOR) 80 MG tablet Take 80 mg by mouth at bedtime.    . [DISCONTINUED] rivaroxaban (XARELTO) 20 MG TABS tablet Take 1 tablet (20 mg total) by mouth daily with supper. To start once finishes Xarelto 15m prescription. 30 tablet 3   No current facility-administered medications for this visit.   Facility-Administered Medications Ordered in Other Visits  Medication Dose Route Frequency Provider Last Rate Last Dose  . heparin lock flush 100 unit/mL  500 Units Intravenous Once JForest Gleason MD      . sodium chloride 0.9 % injection 10 mL  10 mL Intracatheter PRN JForest Gleason MD   10 mL at 07/07/15 0911  . sodium chloride flush (NS) 0.9 % injection 10 mL  10 mL Intravenous PRN JDelorise Shiner  Sueko Dimichele, MD      . sodium chloride flush (NS) 0.9 % injection 10 mL  10 mL Intravenous PRN Forest Gleason, MD   10 mL at 11/18/15 0945    OBJECTIVE: BP 145/83 mmHg  Pulse 109  Temp(Src) 97.6 F (36.4 C)  Resp 18  Wt 314 lb 6 oz (142.6 kg)   Body mass index is 51.5 kg/(m^2).    ECOG FS:0 - Asymptomatic  General: Well-developed, well-nourished, no acute distress. Eyes: Pink conjunctiva, anicteric sclera. HEENT: Normocephalic, moist mucous membranes, clear oropharnyx. Lungs: Clear to auscultation bilaterally. Heart: Regular rate and rhythm. No rubs, murmurs, or gallops. Abdomen: Soft, nontender, nondistended. No organomegaly noted, normoactive bowel sounds..  Right breast: Palpable mass open wound probably infected lymphedema right upper extremity Musculoskeletal: No edema, cyanosis, or clubbing. Neuro: Alert, answering all questions appropriately. Cranial nerves grossly intact. Skin: No rashes or petechiae noted. Psych: Normal affect.Lower extremity swelling present Lymphatics: No cervical, calvicular, axillary or inguinal LAD.  Right upper extremity lymphedema is gradually getting better.  Is grade 3 lymphedema. Right  breast: In duration persist however open wound is gradually healing.  There are multiple lady is a bleeding.  Right breast mass is gradually decreasing.  Wound size is now 9 x 4.5 cm and is very superficial LAB RESULTS:                          Infusion on 11/18/2015  Component Date Value Ref Range Status  . WBC 11/18/2015 10.1  3.6 - 11.0 K/uL Final  . RBC 11/18/2015 4.34  3.80 - 5.20 MIL/uL Final  . Hemoglobin 11/18/2015 11.7* 12.0 - 16.0 g/dL Final  . HCT 11/18/2015 36.0  35.0 - 47.0 % Final  . MCV 11/18/2015 82.8  80.0 - 100.0 fL Final  . MCH 11/18/2015 26.9  26.0 - 34.0 pg Final  . MCHC 11/18/2015 32.4  32.0 - 36.0 g/dL Final  . RDW 11/18/2015 22.0* 11.5 - 14.5 % Final  . Platelets 11/18/2015 329  150 - 440 K/uL Final  . Neutrophils Relative % 11/18/2015 78   Final  . Neutro Abs 11/18/2015 7.9* 1.4 - 6.5 K/uL Final  . Lymphocytes Relative 11/18/2015 14   Final  . Lymphs Abs 11/18/2015 1.5  1.0 - 3.6 K/uL Final  . Monocytes Relative 11/18/2015 7   Final  . Monocytes Absolute 11/18/2015 0.7  0.2 - 0.9 K/uL Final  . Eosinophils Relative 11/18/2015 0   Final  . Eosinophils Absolute 11/18/2015 0.0  0 - 0.7 K/uL Final  . Basophils Relative 11/18/2015 1   Final  . Basophils Absolute 11/18/2015 0.1  0 - 0.1 K/uL Final  . Sodium 11/18/2015 138  135 - 145 mmol/L Final  . Potassium 11/18/2015 3.8  3.5 - 5.1 mmol/L Final  . Chloride 11/18/2015 107  101 - 111 mmol/L Final  . CO2 11/18/2015 22  22 - 32 mmol/L Final  . Glucose, Bld 11/18/2015 147* 65 - 99 mg/dL Final  . BUN 11/18/2015 10  6 - 20 mg/dL Final  . Creatinine, Ser 11/18/2015 0.72  0.44 - 1.00 mg/dL Final  . Calcium 11/18/2015 8.9  8.9 - 10.3 mg/dL Final  . Total Protein 11/18/2015 7.4  6.5 - 8.1 g/dL Final  . Albumin 11/18/2015 3.5  3.5 - 5.0 g/dL Final  . AST 11/18/2015 25  15 - 41 U/L Final  . ALT 11/18/2015 11* 14 - 54 U/L Final  . Alkaline Phosphatase 11/18/2015 47  38 - 126 U/L Final  . Total Bilirubin 11/18/2015  0.4  0.3 - 1.2 mg/dL Final  . GFR calc non Af Amer 11/18/2015 >60  >60 mL/min Final  . GFR calc Af Amer 11/18/2015 >60  >60 mL/min Final   Comment: (NOTE) The eGFR has been calculated using the CKD EPI equation. This calculation has not been validated in all clinical situations. eGFR's persistently <60 mL/min signify possible Chronic Kidney Disease.   . Anion gap 11/18/2015 9  5 - 15 Final   CT scan of the chest and abdomen has been reviewed. All the lesions were measured. CT scan and bone scan has been reviewed independently. Julie's in his left upper lobe infiltrate which appears to be inflammatory with air bronchogram Bone scan has been reported to be completely negative has been reviewed independently 176.6 (H) 78.9 (H)CM 9.1 (H)CM        ASSESSMENT:  Stage IV carcinoma of breast CT scan has been reviewed shows progressive disease. CT scan has been reviewed independently and reviewed with the patient. Retroperitoneal lymph nodes are slightly enlarged  She is tolerating ERIBULIN very well.  Significant reduction in the size of the breast mass All lab data has been reviewed  CEA is 176 (baseline )and will be followed  seling patient and family regarding prognosis and options of treatment and available resources. Reviewing scan Assessment    Of  tumor And management of new onset of thrombus Forest Gleason, MD   11/18/2015 10:29 AM

## 2015-11-19 LAB — CEA: CEA: 176.6 ng/mL — AB (ref 0.0–4.7)

## 2015-11-21 ENCOUNTER — Encounter: Payer: Self-pay | Admitting: Oncology

## 2015-11-25 ENCOUNTER — Inpatient Hospital Stay: Payer: BC Managed Care – PPO

## 2015-11-25 DIAGNOSIS — C50911 Malignant neoplasm of unspecified site of right female breast: Secondary | ICD-10-CM

## 2015-11-25 DIAGNOSIS — C7951 Secondary malignant neoplasm of bone: Secondary | ICD-10-CM

## 2015-11-25 LAB — CBC WITH DIFFERENTIAL/PLATELET
BASOS PCT: 1 %
Basophils Absolute: 0.1 10*3/uL (ref 0–0.1)
EOS ABS: 0 10*3/uL (ref 0–0.7)
Eosinophils Relative: 1 %
HCT: 34.2 % — ABNORMAL LOW (ref 35.0–47.0)
HEMOGLOBIN: 11.2 g/dL — AB (ref 12.0–16.0)
LYMPHS ABS: 1.6 10*3/uL (ref 1.0–3.6)
Lymphocytes Relative: 21 %
MCH: 27.1 pg (ref 26.0–34.0)
MCHC: 32.9 g/dL (ref 32.0–36.0)
MCV: 82.3 fL (ref 80.0–100.0)
Monocytes Absolute: 0.4 10*3/uL (ref 0.2–0.9)
Monocytes Relative: 5 %
NEUTROS ABS: 5.6 10*3/uL (ref 1.4–6.5)
NEUTROS PCT: 72 %
Platelets: 314 10*3/uL (ref 150–440)
RBC: 4.15 MIL/uL (ref 3.80–5.20)
RDW: 21.6 % — AB (ref 11.5–14.5)
WBC: 7.6 10*3/uL (ref 3.6–11.0)

## 2015-11-25 LAB — COMPREHENSIVE METABOLIC PANEL
ALT: 16 U/L (ref 14–54)
ANION GAP: 10 (ref 5–15)
AST: 23 U/L (ref 15–41)
Albumin: 3.4 g/dL — ABNORMAL LOW (ref 3.5–5.0)
Alkaline Phosphatase: 50 U/L (ref 38–126)
BUN: 11 mg/dL (ref 6–20)
CHLORIDE: 106 mmol/L (ref 101–111)
CO2: 25 mmol/L (ref 22–32)
CREATININE: 0.72 mg/dL (ref 0.44–1.00)
Calcium: 8.9 mg/dL (ref 8.9–10.3)
Glucose, Bld: 173 mg/dL — ABNORMAL HIGH (ref 65–99)
Potassium: 3.7 mmol/L (ref 3.5–5.1)
Sodium: 141 mmol/L (ref 135–145)
Total Bilirubin: 0.5 mg/dL (ref 0.3–1.2)
Total Protein: 7.3 g/dL (ref 6.5–8.1)

## 2015-11-25 MED ORDER — HEPARIN SOD (PORK) LOCK FLUSH 100 UNIT/ML IV SOLN
500.0000 [IU] | Freq: Once | INTRAVENOUS | Status: AC
  Administered 2015-11-25: 500 [IU] via INTRAVENOUS

## 2015-11-25 MED ORDER — HEPARIN SOD (PORK) LOCK FLUSH 100 UNIT/ML IV SOLN
INTRAVENOUS | Status: AC
  Filled 2015-11-25: qty 5

## 2015-11-25 MED ORDER — SODIUM CHLORIDE 0.9% FLUSH
10.0000 mL | INTRAVENOUS | Status: DC | PRN
  Filled 2015-11-25: qty 10

## 2015-11-25 MED ORDER — SODIUM CHLORIDE 0.9 % IV SOLN
Freq: Once | INTRAVENOUS | Status: AC
  Administered 2015-11-25: 15:00:00 via INTRAVENOUS
  Filled 2015-11-25: qty 0.4

## 2015-11-25 MED ORDER — PROCHLORPERAZINE MALEATE 10 MG PO TABS
10.0000 mg | ORAL_TABLET | Freq: Once | ORAL | Status: AC
  Administered 2015-11-25: 10 mg via ORAL
  Filled 2015-11-25: qty 1

## 2015-11-25 MED ORDER — SODIUM CHLORIDE 0.9 % IV SOLN
Freq: Once | INTRAVENOUS | Status: AC
  Administered 2015-11-25: 14:00:00 via INTRAVENOUS
  Filled 2015-11-25: qty 1000

## 2015-11-25 MED ORDER — SODIUM CHLORIDE 0.9 % IV SOLN
1.4000 mg/m2 | Freq: Once | INTRAVENOUS | Status: AC
  Administered 2015-11-25: 3.6 mg via INTRAVENOUS
  Filled 2015-11-25: qty 7.2

## 2015-11-25 MED ORDER — HEPARIN SOD (PORK) LOCK FLUSH 10 UNIT/ML IV SOLN: 10.0000 [IU] | Freq: Once | INTRAVENOUS | Status: AC

## 2015-12-08 ENCOUNTER — Other Ambulatory Visit: Payer: Self-pay | Admitting: *Deleted

## 2015-12-08 DIAGNOSIS — C50911 Malignant neoplasm of unspecified site of right female breast: Secondary | ICD-10-CM

## 2015-12-09 ENCOUNTER — Inpatient Hospital Stay: Payer: BC Managed Care – PPO

## 2015-12-09 ENCOUNTER — Inpatient Hospital Stay: Payer: BC Managed Care – PPO | Attending: Internal Medicine

## 2015-12-09 ENCOUNTER — Inpatient Hospital Stay (HOSPITAL_BASED_OUTPATIENT_CLINIC_OR_DEPARTMENT_OTHER): Payer: BC Managed Care – PPO | Admitting: Internal Medicine

## 2015-12-09 VITALS — BP 159/86 | HR 84 | Temp 99.1°F | Resp 18 | Wt 314.2 lb

## 2015-12-09 DIAGNOSIS — C7951 Secondary malignant neoplasm of bone: Principal | ICD-10-CM

## 2015-12-09 DIAGNOSIS — I890 Lymphedema, not elsewhere classified: Secondary | ICD-10-CM | POA: Diagnosis not present

## 2015-12-09 DIAGNOSIS — Z7982 Long term (current) use of aspirin: Secondary | ICD-10-CM

## 2015-12-09 DIAGNOSIS — C50911 Malignant neoplasm of unspecified site of right female breast: Secondary | ICD-10-CM

## 2015-12-09 DIAGNOSIS — Z5111 Encounter for antineoplastic chemotherapy: Secondary | ICD-10-CM | POA: Diagnosis not present

## 2015-12-09 DIAGNOSIS — I10 Essential (primary) hypertension: Secondary | ICD-10-CM | POA: Insufficient documentation

## 2015-12-09 DIAGNOSIS — D649 Anemia, unspecified: Secondary | ICD-10-CM

## 2015-12-09 DIAGNOSIS — G4730 Sleep apnea, unspecified: Secondary | ICD-10-CM

## 2015-12-09 DIAGNOSIS — Z87891 Personal history of nicotine dependence: Secondary | ICD-10-CM

## 2015-12-09 DIAGNOSIS — C7970 Secondary malignant neoplasm of unspecified adrenal gland: Secondary | ICD-10-CM

## 2015-12-09 DIAGNOSIS — C7989 Secondary malignant neoplasm of other specified sites: Secondary | ICD-10-CM

## 2015-12-09 DIAGNOSIS — T451X5A Adverse effect of antineoplastic and immunosuppressive drugs, initial encounter: Secondary | ICD-10-CM

## 2015-12-09 DIAGNOSIS — I252 Old myocardial infarction: Secondary | ICD-10-CM | POA: Diagnosis not present

## 2015-12-09 DIAGNOSIS — G620 Drug-induced polyneuropathy: Secondary | ICD-10-CM | POA: Insufficient documentation

## 2015-12-09 DIAGNOSIS — Z79899 Other long term (current) drug therapy: Secondary | ICD-10-CM

## 2015-12-09 DIAGNOSIS — E669 Obesity, unspecified: Secondary | ICD-10-CM

## 2015-12-09 LAB — CBC WITH DIFFERENTIAL/PLATELET
Basophils Absolute: 0.1 10*3/uL (ref 0–0.1)
Basophils Relative: 1 %
Eosinophils Absolute: 0 10*3/uL (ref 0–0.7)
Eosinophils Relative: 0 %
HEMATOCRIT: 34.9 % — AB (ref 35.0–47.0)
Hemoglobin: 11.3 g/dL — ABNORMAL LOW (ref 12.0–16.0)
LYMPHS ABS: 1.6 10*3/uL (ref 1.0–3.6)
LYMPHS PCT: 19 %
MCH: 26.5 pg (ref 26.0–34.0)
MCHC: 32.5 g/dL (ref 32.0–36.0)
MCV: 81.4 fL (ref 80.0–100.0)
Monocytes Absolute: 0.7 10*3/uL (ref 0.2–0.9)
Monocytes Relative: 8 %
NEUTROS ABS: 6.2 10*3/uL (ref 1.4–6.5)
NEUTROS PCT: 72 %
Platelets: 302 10*3/uL (ref 150–440)
RBC: 4.29 MIL/uL (ref 3.80–5.20)
RDW: 22.4 % — AB (ref 11.5–14.5)
WBC: 8.7 10*3/uL (ref 3.6–11.0)

## 2015-12-09 LAB — COMPREHENSIVE METABOLIC PANEL
ALT: 14 U/L (ref 14–54)
AST: 19 U/L (ref 15–41)
Albumin: 3.3 g/dL — ABNORMAL LOW (ref 3.5–5.0)
Alkaline Phosphatase: 56 U/L (ref 38–126)
Anion gap: 9 (ref 5–15)
BILIRUBIN TOTAL: 0.6 mg/dL (ref 0.3–1.2)
BUN: 9 mg/dL (ref 6–20)
CO2: 25 mmol/L (ref 22–32)
CREATININE: 0.77 mg/dL (ref 0.44–1.00)
Calcium: 8.7 mg/dL — ABNORMAL LOW (ref 8.9–10.3)
Chloride: 103 mmol/L (ref 101–111)
GFR calc Af Amer: >60 mL/min (ref >60)
Glucose, Bld: 142 mg/dL — ABNORMAL HIGH (ref 65–99)
POTASSIUM: 3.9 mmol/L (ref 3.5–5.1)
Sodium: 137 mmol/L (ref 135–145)
TOTAL PROTEIN: 7.1 g/dL (ref 6.5–8.1)

## 2015-12-09 MED ORDER — SODIUM CHLORIDE 0.9 % IV SOLN
Freq: Once | INTRAVENOUS | Status: AC
  Administered 2015-12-09: 16:00:00 via INTRAVENOUS
  Filled 2015-12-09: qty 0.4

## 2015-12-09 MED ORDER — SODIUM CHLORIDE 0.9 % IV SOLN
1.4000 mg/m2 | Freq: Once | INTRAVENOUS | Status: AC
  Administered 2015-12-09: 3.6 mg via INTRAVENOUS
  Filled 2015-12-09: qty 7.2

## 2015-12-09 MED ORDER — HEPARIN SOD (PORK) LOCK FLUSH 100 UNIT/ML IV SOLN
500.0000 [IU] | Freq: Once | INTRAVENOUS | Status: AC | PRN
  Administered 2015-12-09: 500 [IU]
  Filled 2015-12-09: qty 5

## 2015-12-09 MED ORDER — SODIUM CHLORIDE 0.9 % IV SOLN
Freq: Once | INTRAVENOUS | Status: AC
  Administered 2015-12-09: 16:00:00 via INTRAVENOUS
  Filled 2015-12-09: qty 1000

## 2015-12-09 MED ORDER — PROCHLORPERAZINE MALEATE 10 MG PO TABS
10.0000 mg | ORAL_TABLET | Freq: Once | ORAL | Status: AC
  Administered 2015-12-09: 10 mg via ORAL
  Filled 2015-12-09: qty 1

## 2015-12-09 NOTE — Assessment & Plan Note (Signed)
Secondary to malignancy currently stable. Monitor for now.

## 2015-12-09 NOTE — Progress Notes (Signed)
[place]  [phone]  Fax:(336) [phone]     [name] DOB: 28-May-1956  MR#: [id number]  KKX#:[id number]  Patient Care Team: Kirk Ruths, MD as PCP - General (Internal Medicine)  CHIEF COMPLAINT:  Chief Complaint  Patient presents with  . Breast [place] OFFICE PROGRESS NOTE  Patient Care Team: Kirk Ruths, MD as PCP - General (Internal Medicine)  Breast cancer metastasized to bone [facility])   Staging form: Breast, AJCC 7th Edition     Clinical stage from 06/27/2013: Stage IV (T4, N2, M1) - Signed by [name], NP on 12/25/2014    Oncology History   1. RIGHT Breast cancer [Neglected] T4 N2 M1 ER positive HER-2 negative, stage IV disease of the right breast with metastases to thoracic spine, right chest wall, adrenal gland. Jan 2015- Letrozole + [name] 2016- CEA-223]  2. July 2016- [facility] study-random Taxol; Progression- CEA ~200s/Imaging  3. April 2017- ERIBULIN  # LYMPHEDEMA of RIGHT UE /Breast ulcerated lesion     Breast cancer metastasized to bone ([place])   06/27/2013 Initial Diagnosis Breast cancer metastasized to bone   INTERVAL HISTORY:  This is my first interaction with the patient; Patient's previous treating oncologist was Dr. Oliva Bustard.  I reviewed the patient's prior charts/pertinent labs/imaging in detail; findings are summarized above.   A pleasant 60 year old morbidly obese Caucasian female patient with above history of metastatic breast cancer is currently on third line therapy with Eribulin history for follow-up.  Patient denies any nausea vomiting. Denies any worsening tingling and numbness of her extremities. Her appetite is good. No fever no chills.   REVIEW OF SYSTEMS:  A complete 10 point review of system is done which is negative except mentioned above/history of present illness.   PAST MEDICAL HISTORY :  Past Medical History  Diagnosis Date  . Hypertension     . Myocardial infarction ([place])   . Anginal pain ([place])   . Sleep apnea   . Anemia   . Post-menopausal 2006  . Last menstrual period (LMP) > 10 days ago 2007  . Breast cancer ([place])     2014   . Breast cancer metastasized to bone ([place]) 11/21/2014    PAST SURGICAL HISTORY :   Past Surgical History  Procedure Laterality Date  . Coronary angioplasty with stent placement N/A   . Hernia repair      umbilical hernia repair  . Portacath placement Right 02/03/2015    Procedure: INSERTION PORT-A-CATH;  Surgeon: [name], MD;  Location: [facility] ORS;  Service: General;  Laterality: Right;    FAMILY HISTORY :   Family History  Problem Relation Age of Onset  . Atrial fibrillation Sister   . Hypertension Sister     SOCIAL HISTORY:   Social History  Substance Use Topics  . Smoking status: Former Smoker -- 1.00 packs/day    Types: Cigarettes    Quit date: 10/25/2004  . Smokeless tobacco: Never Used     Comment: stopped smoking greater than 7 years ago  . Alcohol Use: No    ALLERGIES:  is allergic to no known allergies.  MEDICATIONS:  Current Outpatient Prescriptions  Medication Sig Dispense Refill  . acetaminophen (TYLENOL) 500 MG tablet Take 1,000 mg by mouth every 6 (six) hours as needed.    Marland Kitchen aspirin EC 81 MG tablet Take 81 mg by mouth daily.    . clopidogrel (PLAVIX) 75 MG tablet Take 75 mg  by mouth daily.    . [employer] CALCIUM 600+D 600-800 MG-UNIT TABS Take 1 tablet by mouth daily. At noon  3  . fexofenadine (ALLEGRA) 180 MG tablet Take 180 mg by mouth at bedtime.    . furosemide (LASIX) 20 MG tablet Take 20 mg by mouth daily.  1  . lidocaine-prilocaine (EMLA) cream APPLY TOPICALLY AS NEEDED 30 g 2  . lisinopril (PRINIVIL,ZESTRIL) 20 MG tablet Take 20 mg by mouth daily.    . metoprolol succinate (TOPROL-XL) 50 MG 24 hr tablet Take 50 mg by mouth 2 (two) times daily. Take with or immediately following a meal.    . metroNIDAZOLE (FLAGYL) 500 MG tablet Take 1 tablet (500 mg  total) by mouth 3 (three) times daily. 30 tablet 0  . omega-3 acid ethyl esters (LOVAZA) 1 G capsule Take 1 g by mouth at bedtime.    Marland Kitchen PHOSPHA 250 NEUTRAL 155-852-130 MG tablet TAKE 1 TABLET BY MOUTH TWICE DAILY 60 tablet 5  . potassium chloride (K-DUR) 10 MEQ tablet Take 10 mEq by mouth daily.   1  . simvastatin (ZOCOR) 80 MG tablet Take 80 mg by mouth at bedtime.    . [DISCONTINUED] rivaroxaban (XARELTO) 20 MG TABS tablet Take 1 tablet (20 mg total) by mouth daily with supper. To start once finishes Xarelto 62m prescription. 30 tablet 3   No current facility-administered medications for this visit.   Facility-Administered Medications Ordered in Other Visits  Medication Dose Route Frequency Provider Last Rate Last Dose  . sodium chloride 0.9 % injection 10 mL  10 mL Intracatheter PRN JForest Gleason MD   10 mL at 07/07/15 0911  . sodium chloride flush (NS) 0.9 % injection 10 mL  10 mL Intravenous PRN JForest Gleason MD        PHYSICAL EXAMINATION: ECOG PERFORMANCE STATUS: 1 - Symptomatic but completely ambulatory  BP 159/86 mmHg  Pulse 84  Temp(Src) 99.1 F (37.3 C) (Tympanic)  Resp 18  Wt 314 lb 2.5 oz (142.5 kg)  Filed Weights   12/09/15 1353  Weight: 314 lb 2.5 oz (142.5 kg)    GENERAL: Well-nourished well-developed; Alert, no distress and comfortable.   With her husband. Morbidly obese. EYES: no pallor or icterus OROPHARYNX: no thrush or ulceration;  NECK: supple, no masses felt LYMPH:  no palpable lymphadenopathy in the cervical, axillary or inguinal regions LUNGS: clear to auscultation and  No wheeze or crackles HEART/CVS: regular rate & rhythm and no murmurs; chronic bilateral lower extremity edema. ABDOMEN:abdomen soft, non-tender and normal bowel sounds Musculoskeletal:no cyanosis of digits and no clubbing; right upper extremity lymphedema noted. PSYCH: alert & oriented x 3 with fluent speech NEURO: no focal motor/sensory deficits SKIN:  no rashes or significant  lesions  LABORATORY DATA:  I have reviewed the data as listed    Component Value Date/Time   NA 137 12/09/2015 1335   NA 142 08/01/2014 1435   K 3.9 12/09/2015 1335   K 3.5 08/01/2014 1435   CL 103 12/09/2015 1335   CL 103 08/01/2014 1435   CO2 25 12/09/2015 1335   CO2 27 08/01/2014 1435   GLUCOSE 142* 12/09/2015 1335   GLUCOSE 148* 08/01/2014 1435   BUN 9 12/09/2015 1335   BUN 13 08/01/2014 1435   CREATININE 0.77 12/09/2015 1335   CREATININE 0.71 10/02/2014 0930   CALCIUM 8.7* 12/09/2015 1335   CALCIUM 8.6* 10/02/2014 0930   PROT 7.1 12/09/2015 1335   PROT 7.6 10/02/2014 0930   ALBUMIN 3.3* 12/09/2015  1335   ALBUMIN 3.7 10/02/2014 0930   AST 19 12/09/2015 1335   AST 21 10/02/2014 0930   ALT 14 12/09/2015 1335   ALT 23 10/02/2014 0930   ALKPHOS 56 12/09/2015 1335   ALKPHOS 41 10/02/2014 0930   BILITOT 0.6 12/09/2015 1335   BILITOT 0.5 10/02/2014 0930   GFRNONAA >60 12/09/2015 1335   GFRNONAA >60 10/02/2014 0930   GFRNONAA >60 08/01/2014 1435   GFRAA >60 12/09/2015 1335   GFRAA >60 10/02/2014 0930   GFRAA >60 08/01/2014 1435    No results found for: SPEP, UPEP  Lab Results  Component Value Date   WBC 8.7 12/09/2015   NEUTROABS 6.2 12/09/2015   HGB 11.3* 12/09/2015   HCT 34.9* 12/09/2015   MCV 81.4 12/09/2015   PLT 302 12/09/2015      Chemistry      Component Value Date/Time   NA 137 12/09/2015 1335   NA 142 08/01/2014 1435   K 3.9 12/09/2015 1335   K 3.5 08/01/2014 1435   CL 103 12/09/2015 1335   CL 103 08/01/2014 1435   CO2 25 12/09/2015 1335   CO2 27 08/01/2014 1435   BUN 9 12/09/2015 1335   BUN 13 08/01/2014 1435   CREATININE 0.77 12/09/2015 1335   CREATININE 0.71 10/02/2014 0930      Component Value Date/Time   CALCIUM 8.7* 12/09/2015 1335   CALCIUM 8.6* 10/02/2014 0930   ALKPHOS 56 12/09/2015 1335   ALKPHOS 41 10/02/2014 0930   AST 19 12/09/2015 1335   AST 21 10/02/2014 0930   ALT 14 12/09/2015 1335   ALT 23 10/02/2014 0930   BILITOT  0.6 12/09/2015 1335   BILITOT 0.5 10/02/2014 0930       RADIOGRAPHIC STUDIES: I have personally reviewed the radiological images as listed and agreed with the findings in the report. No results found.   ASSESSMENT & PLAN:  Breast cancer metastasized to bone [facility]) Patient currently on third line therapy with Eribulin status post cycle #1. Patient tolerating chemotherapy fairly well without any major side effects. Proceed with cycle #2 day 1 today. CBC CMP within normal limits.  Chemotherapy-induced neuropathy (HCC) Grade 1. Monitor for now.   Lymphedema of upper extremity Secondary to malignancy currently stable. Monitor for now.    # Patient will follow-up with me in 3 weeks/4 cycle #3 day 1; weekly labs.   Orders Placed This Encounter  Procedures  . CBC with Differential    Standing Status: Future     Number of Occurrences:      Standing Expiration Date: 12/08/2016  . Basic metabolic panel    Standing Status: Future     Number of Occurrences:      Standing Expiration Date: 12/08/2016    Order Specific Question:  Has the patient fasted?    Answer:  No  . CBC with Differential    Standing Status: Future     Number of Occurrences:      Standing Expiration Date: 12/08/2016  . Comprehensive metabolic panel    Standing Status: Future     Number of Occurrences:      Standing Expiration Date: 12/08/2016    Order Specific Question:  Has the patient fasted?    Answer:  No  . CBC with Differential    Standing Status: Future     Number of Occurrences:      Standing Expiration Date: 12/08/2016  . Basic metabolic panel    Standing Status: Future     Number  of Occurrences:      Standing Expiration Date: 12/08/2016    Order Specific Question:  Has the patient fasted?    Answer:  No   All questions were answered. The patient knows to call the clinic with any problems, questions or concerns.      Cammie Sickle, MD 12/09/2015 5:55 PM        ROS     Cammie Sickle, MD   12/09/2015 5:55 PM

## 2015-12-09 NOTE — Assessment & Plan Note (Addendum)
Patient currently on third line therapy with Eribulin status post cycle #1. Patient tolerating chemotherapy fairly well without any major side effects. Proceed with cycle #2 day 1 today. CBC CMP within normal limits. Clinically no obvious concerns of progression at this time. We'll plan to get imaging- after 3-4 cycles. We'll check a CEA at next visit.

## 2015-12-09 NOTE — Assessment & Plan Note (Signed)
Grade 1. Monitor for now.

## 2015-12-10 LAB — CEA: CEA: 64.4 ng/mL — ABNORMAL HIGH (ref 0.0–4.7)

## 2015-12-16 ENCOUNTER — Inpatient Hospital Stay: Payer: BC Managed Care – PPO

## 2015-12-16 DIAGNOSIS — C50911 Malignant neoplasm of unspecified site of right female breast: Secondary | ICD-10-CM

## 2015-12-16 DIAGNOSIS — C7951 Secondary malignant neoplasm of bone: Principal | ICD-10-CM

## 2015-12-16 LAB — CBC WITH DIFFERENTIAL/PLATELET
BASOS ABS: 0.1 10*3/uL (ref 0–0.1)
BASOS PCT: 1 %
EOS ABS: 0 10*3/uL (ref 0–0.7)
EOS PCT: 0 %
HCT: 34 % — ABNORMAL LOW (ref 35.0–47.0)
Hemoglobin: 11.1 g/dL — ABNORMAL LOW (ref 12.0–16.0)
Lymphocytes Relative: 21 %
Lymphs Abs: 1.5 10*3/uL (ref 1.0–3.6)
MCH: 26.4 pg (ref 26.0–34.0)
MCHC: 32.8 g/dL (ref 32.0–36.0)
MCV: 80.5 fL (ref 80.0–100.0)
MONO ABS: 0.5 10*3/uL (ref 0.2–0.9)
Monocytes Relative: 6 %
Neutro Abs: 5.3 10*3/uL (ref 1.4–6.5)
Neutrophils Relative %: 72 %
PLATELETS: 291 10*3/uL (ref 150–440)
RBC: 4.22 MIL/uL (ref 3.80–5.20)
RDW: 21.9 % — AB (ref 11.5–14.5)
WBC: 7.4 10*3/uL (ref 3.6–11.0)

## 2015-12-16 LAB — BASIC METABOLIC PANEL
ANION GAP: 8 (ref 5–15)
BUN: 12 mg/dL (ref 6–20)
CALCIUM: 9.1 mg/dL (ref 8.9–10.3)
CO2: 27 mmol/L (ref 22–32)
Chloride: 101 mmol/L (ref 101–111)
Creatinine, Ser: 0.74 mg/dL (ref 0.44–1.00)
GLUCOSE: 161 mg/dL — AB (ref 65–99)
Potassium: 3.8 mmol/L (ref 3.5–5.1)
SODIUM: 136 mmol/L (ref 135–145)

## 2015-12-16 MED ORDER — SODIUM CHLORIDE 0.9 % IV SOLN
1.4000 mg/m2 | Freq: Once | INTRAVENOUS | Status: AC
  Administered 2015-12-16: 3.6 mg via INTRAVENOUS
  Filled 2015-12-16: qty 7.2

## 2015-12-16 MED ORDER — SODIUM CHLORIDE 0.9% FLUSH
10.0000 mL | INTRAVENOUS | Status: DC | PRN
  Filled 2015-12-16: qty 10

## 2015-12-16 MED ORDER — SODIUM CHLORIDE 0.9 % IV SOLN
Freq: Once | INTRAVENOUS | Status: AC
  Administered 2015-12-16: 15:00:00 via INTRAVENOUS
  Filled 2015-12-16: qty 0.4

## 2015-12-16 MED ORDER — SODIUM CHLORIDE 0.9 % IV SOLN
Freq: Once | INTRAVENOUS | Status: AC
  Administered 2015-12-16: 14:00:00 via INTRAVENOUS
  Filled 2015-12-16: qty 1000

## 2015-12-16 MED ORDER — PROCHLORPERAZINE MALEATE 10 MG PO TABS
10.0000 mg | ORAL_TABLET | Freq: Once | ORAL | Status: AC
  Administered 2015-12-16: 10 mg via ORAL
  Filled 2015-12-16: qty 1

## 2015-12-16 MED ORDER — HEPARIN SOD (PORK) LOCK FLUSH 100 UNIT/ML IV SOLN
500.0000 [IU] | Freq: Once | INTRAVENOUS | Status: AC | PRN
  Administered 2015-12-16: 500 [IU]
  Filled 2015-12-16: qty 5

## 2015-12-21 ENCOUNTER — Other Ambulatory Visit: Payer: Self-pay | Admitting: Oncology

## 2015-12-23 ENCOUNTER — Telehealth: Payer: Self-pay | Admitting: *Deleted

## 2015-12-23 ENCOUNTER — Encounter: Payer: Self-pay | Admitting: *Deleted

## 2015-12-23 NOTE — Telephone Encounter (Signed)
Patient will need new order with new MD signature to reorder supplies ( 5X5) [place].  Please call if questions.  [phone].

## 2015-12-23 NOTE — Telephone Encounter (Signed)
Called [facility] regarding request for new order for supplies:  Cutimed Siltec Sorbact 5X5 dressings.  Faxed new order to [facility] - [phone]

## 2015-12-25 NOTE — Telephone Encounter (Signed)
Ashley from [employer] back. Needs wound assessment documentation on patient. Asking to fax this information from chart documentation or write documentation on orgininal order. Looking for measurements of wound size.

## 2015-12-28 NOTE — Telephone Encounter (Signed)
Progress note with wound measurement faxed to [facility] - Monday, December 28, 2015.

## 2015-12-28 NOTE — Telephone Encounter (Signed)
Wound assessment documentation from cancer ctr research team was faxed to [facility] today by Nada Boozer, RN

## 2015-12-30 ENCOUNTER — Inpatient Hospital Stay: Payer: BC Managed Care – PPO

## 2015-12-30 ENCOUNTER — Inpatient Hospital Stay: Payer: BC Managed Care – PPO | Attending: Internal Medicine

## 2015-12-30 ENCOUNTER — Inpatient Hospital Stay (HOSPITAL_BASED_OUTPATIENT_CLINIC_OR_DEPARTMENT_OTHER): Payer: BC Managed Care – PPO | Admitting: Internal Medicine

## 2015-12-30 ENCOUNTER — Other Ambulatory Visit: Payer: Self-pay

## 2015-12-30 VITALS — BP 129/88 | HR 103 | Temp 98.8°F | Resp 18 | Wt 306.9 lb

## 2015-12-30 DIAGNOSIS — I10 Essential (primary) hypertension: Secondary | ICD-10-CM

## 2015-12-30 DIAGNOSIS — C7951 Secondary malignant neoplasm of bone: Secondary | ICD-10-CM | POA: Diagnosis not present

## 2015-12-30 DIAGNOSIS — C7970 Secondary malignant neoplasm of unspecified adrenal gland: Secondary | ICD-10-CM | POA: Insufficient documentation

## 2015-12-30 DIAGNOSIS — Z87891 Personal history of nicotine dependence: Secondary | ICD-10-CM | POA: Insufficient documentation

## 2015-12-30 DIAGNOSIS — E669 Obesity, unspecified: Secondary | ICD-10-CM | POA: Insufficient documentation

## 2015-12-30 DIAGNOSIS — C50911 Malignant neoplasm of unspecified site of right female breast: Secondary | ICD-10-CM

## 2015-12-30 DIAGNOSIS — Z7901 Long term (current) use of anticoagulants: Secondary | ICD-10-CM | POA: Diagnosis not present

## 2015-12-30 DIAGNOSIS — I890 Lymphedema, not elsewhere classified: Secondary | ICD-10-CM

## 2015-12-30 DIAGNOSIS — Z5111 Encounter for antineoplastic chemotherapy: Secondary | ICD-10-CM | POA: Diagnosis not present

## 2015-12-30 DIAGNOSIS — Z7982 Long term (current) use of aspirin: Secondary | ICD-10-CM

## 2015-12-30 DIAGNOSIS — N6489 Other specified disorders of breast: Secondary | ICD-10-CM | POA: Diagnosis not present

## 2015-12-30 DIAGNOSIS — Z79899 Other long term (current) drug therapy: Secondary | ICD-10-CM | POA: Insufficient documentation

## 2015-12-30 DIAGNOSIS — G4730 Sleep apnea, unspecified: Secondary | ICD-10-CM | POA: Diagnosis not present

## 2015-12-30 DIAGNOSIS — I252 Old myocardial infarction: Secondary | ICD-10-CM | POA: Insufficient documentation

## 2015-12-30 DIAGNOSIS — G629 Polyneuropathy, unspecified: Secondary | ICD-10-CM | POA: Insufficient documentation

## 2015-12-30 LAB — COMPREHENSIVE METABOLIC PANEL
ALT: 14 U/L (ref 14–54)
AST: 22 U/L (ref 15–41)
Albumin: 3.4 g/dL — ABNORMAL LOW (ref 3.5–5.0)
Alkaline Phosphatase: 58 U/L (ref 38–126)
Anion gap: 7 (ref 5–15)
BUN: 13 mg/dL (ref 6–20)
CHLORIDE: 103 mmol/L (ref 101–111)
CO2: 25 mmol/L (ref 22–32)
CREATININE: 0.87 mg/dL (ref 0.44–1.00)
Calcium: 8.9 mg/dL (ref 8.9–10.3)
GFR calc non Af Amer: >60 mL/min (ref >60)
Glucose, Bld: 132 mg/dL — ABNORMAL HIGH (ref 65–99)
Potassium: 4.1 mmol/L (ref 3.5–5.1)
SODIUM: 135 mmol/L (ref 135–145)
Total Bilirubin: 0.4 mg/dL (ref 0.3–1.2)
Total Protein: 7.3 g/dL (ref 6.5–8.1)

## 2015-12-30 LAB — CBC WITH DIFFERENTIAL/PLATELET
BASOS ABS: 0.1 10*3/uL (ref 0–0.1)
BASOS PCT: 1 %
EOS ABS: 0 10*3/uL (ref 0–0.7)
EOS PCT: 0 %
HCT: 34.7 % — ABNORMAL LOW (ref 35.0–47.0)
Hemoglobin: 11.3 g/dL — ABNORMAL LOW (ref 12.0–16.0)
Lymphocytes Relative: 14 %
Lymphs Abs: 1.4 10*3/uL (ref 1.0–3.6)
MCH: 26.1 pg (ref 26.0–34.0)
MCHC: 32.5 g/dL (ref 32.0–36.0)
MCV: 80.1 fL (ref 80.0–100.0)
Monocytes Absolute: 0.9 10*3/uL (ref 0.2–0.9)
Monocytes Relative: 9 %
Neutro Abs: 7.9 10*3/uL — ABNORMAL HIGH (ref 1.4–6.5)
Neutrophils Relative %: 76 %
PLATELETS: 306 10*3/uL (ref 150–440)
RBC: 4.33 MIL/uL (ref 3.80–5.20)
RDW: 22.4 % — ABNORMAL HIGH (ref 11.5–14.5)
WBC: 10.3 10*3/uL (ref 3.6–11.0)

## 2015-12-30 MED ORDER — SODIUM CHLORIDE 0.9 % IV SOLN
Freq: Once | INTRAVENOUS | Status: AC
  Administered 2015-12-30: 15:00:00 via INTRAVENOUS
  Filled 2015-12-30: qty 0.4

## 2015-12-30 MED ORDER — HEPARIN SOD (PORK) LOCK FLUSH 100 UNIT/ML IV SOLN
500.0000 [IU] | Freq: Once | INTRAVENOUS | Status: AC
  Administered 2015-12-30: 500 [IU] via INTRAVENOUS
  Filled 2015-12-30: qty 5

## 2015-12-30 MED ORDER — SODIUM CHLORIDE 0.9 % IV SOLN
1.4000 mg/m2 | Freq: Once | INTRAVENOUS | Status: AC
  Administered 2015-12-30: 3.6 mg via INTRAVENOUS
  Filled 2015-12-30: qty 7.2

## 2015-12-30 MED ORDER — SODIUM CHLORIDE 0.9% FLUSH
10.0000 mL | INTRAVENOUS | Status: DC | PRN
  Administered 2015-12-30: 10 mL via INTRAVENOUS
  Filled 2015-12-30: qty 10

## 2015-12-30 MED ORDER — PROCHLORPERAZINE MALEATE 10 MG PO TABS
10.0000 mg | ORAL_TABLET | Freq: Once | ORAL | Status: AC
  Administered 2015-12-30: 10 mg via ORAL
  Filled 2015-12-30: qty 1

## 2015-12-30 MED ORDER — SODIUM CHLORIDE 0.9 % IV SOLN
Freq: Once | INTRAVENOUS | Status: AC
  Administered 2015-12-30: 15:00:00 via INTRAVENOUS
  Filled 2015-12-30: qty 1000

## 2015-12-30 NOTE — Progress Notes (Signed)
[place]  [phone]  Fax:(336) [phone]     [name] DOB: Jul 07, 1955  MR#: [id number]  FYB#:[id number]  Patient Care Team: Kirk Ruths, MD as PCP - General (Internal Medicine)  CHIEF COMPLAINT:  Chief Complaint  Patient presents with  . Breast cancer metastasized to bone    [place] OFFICE PROGRESS NOTE  Patient Care Team: Kirk Ruths, MD as PCP - General (Internal Medicine)  Breast cancer metastasized to bone [facility])   Staging form: Breast, AJCC 7th Edition     Clinical stage from 06/27/2013: Stage IV (T4, N2, M1) - Signed by [name], NP on 12/25/2014    Oncology History   1. RIGHT Breast cancer [Neglected] T4 N2 M1 ER positive HER-2 negative, stage IV disease of the right breast with metastases to thoracic spine, right chest wall, adrenal gland. Jan 2015- Letrozole + [name] 2016- CEA-223]  2. July 2016- [facility] study-random Taxol; Progression- CEA ~200s/Imaging  3. April 2017- ERIBULIN  # LYMPHEDEMA of RIGHT UE /Breast ulcerated lesion     Breast cancer metastasized to bone ([place])   06/27/2013 Initial Diagnosis Breast cancer metastasized to bone   INTERVAL HISTORY:  A pleasant 60 year old morbidly obese Caucasian female patient with above history of metastatic breast cancer is currently on third line therapy with Eribulin history for follow-up.  Patient states her right breast ulcerated lesion is scabbing; healing   Patient denies any nausea vomiting. Mild tingling and numbness of hands and feet. Denies any worsening tingling and numbness of her extremities. Her appetite is good. No fever no chills.   REVIEW OF SYSTEMS:  A complete 10 point review of system is done which is negative except mentioned above/history of present illness.   PAST MEDICAL HISTORY :  Past Medical History  Diagnosis Date  . Hypertension   . Myocardial infarction ([place])   . Anginal pain ([place])   . Sleep  apnea   . Anemia   . Post-menopausal 2006  . Last menstrual period (LMP) > 10 days ago 2007  . Breast cancer ([place])     2014   . Breast cancer metastasized to bone ([place]) 11/21/2014  . Wound drainage     PAST SURGICAL HISTORY :   Past Surgical History  Procedure Laterality Date  . Coronary angioplasty with stent placement N/A   . Hernia repair      umbilical hernia repair  . Portacath placement Right 02/03/2015    Procedure: INSERTION PORT-A-CATH;  Surgeon: [name], MD;  Location: [facility] ORS;  Service: General;  Laterality: Right;    FAMILY HISTORY :   Family History  Problem Relation Age of Onset  . Atrial fibrillation Sister   . Hypertension Sister     SOCIAL HISTORY:   Social History  Substance Use Topics  . Smoking status: Former Smoker -- 1.00 packs/day    Types: Cigarettes    Quit date: 10/25/2004  . Smokeless tobacco: Never Used     Comment: stopped smoking greater than 7 years ago  . Alcohol Use: No    ALLERGIES:  is allergic to no known allergies.  MEDICATIONS:  Current Outpatient Prescriptions  Medication Sig Dispense Refill  . acetaminophen (TYLENOL) 500 MG tablet Take 1,000 mg by mouth every 6 (six) hours as needed.    Marland Kitchen aspirin EC 81 MG tablet Take 81 mg by mouth daily.    . clopidogrel (PLAVIX) 75 MG tablet Take 75 mg by mouth daily.    Marland Kitchen  [facility] CALCIUM 600+D 600-800 MG-UNIT TABS Take 1 tablet by mouth daily. At noon  3  . fexofenadine (ALLEGRA) 180 MG tablet Take 180 mg by mouth at bedtime.    . furosemide (LASIX) 20 MG tablet Take 20 mg by mouth daily.  1  . lidocaine-prilocaine (EMLA) cream APPLY TOPICALLY AS NEEDED 30 g 2  . lisinopril (PRINIVIL,ZESTRIL) 20 MG tablet Take 20 mg by mouth daily.    . metoprolol succinate (TOPROL-XL) 50 MG 24 hr tablet Take 50 mg by mouth 2 (two) times daily. Take with or immediately following a meal.    . metroNIDAZOLE (FLAGYL) 500 MG tablet Take 1 tablet (500 mg total) by mouth 3 (three) times daily. 30 tablet 0    . omega-3 acid ethyl esters (LOVAZA) 1 G capsule Take 1 g by mouth at bedtime.    . ondansetron (ZOFRAN) 8 MG tablet TAKE 1 TABLET (8MG) BY MOUTH TWICE DAILY START THE DAY AFTER CHEMOTHERAPY FOR 2 DAYS THEN TAKE AS NEEDED FOR NAUSEA OR VOMITING 30 tablet 0  . PHOSPHA 250 NEUTRAL 155-852-130 MG tablet TAKE 1 TABLET BY MOUTH TWICE DAILY 60 tablet 5  . potassium chloride (K-DUR) 10 MEQ tablet Take 10 mEq by mouth daily.   1  . simvastatin (ZOCOR) 80 MG tablet Take 80 mg by mouth at bedtime.    . [DISCONTINUED] rivaroxaban (XARELTO) 20 MG TABS tablet Take 1 tablet (20 mg total) by mouth daily with supper. To start once finishes Xarelto 71m prescription. 30 tablet 3   No current facility-administered medications for this visit.   Facility-Administered Medications Ordered in Other Visits  Medication Dose Route Frequency Provider Last Rate Last Dose  . sodium chloride 0.9 % injection 10 mL  10 mL Intracatheter PRN JForest Gleason MD   10 mL at 07/07/15 0911  . sodium chloride flush (NS) 0.9 % injection 10 mL  10 mL Intravenous PRN JForest Gleason MD        PHYSICAL EXAMINATION: ECOG PERFORMANCE STATUS: 1 - Symptomatic but completely ambulatory  BP 129/88 mmHg  Pulse 103  Temp(Src) 98.8 F (37.1 C) (Tympanic)  Resp 18  Wt 306 lb 14.1 oz (139.2 kg)  Filed Weights   12/30/15 1357  Weight: 306 lb 14.1 oz (139.2 kg)    GENERAL: Well-nourished well-developed; Alert, no distress and comfortable.   With her husband. Morbidly obese. EYES: no pallor or icterus OROPHARYNX: no thrush or ulceration;  NECK: supple, no masses felt LYMPH:  no palpable lymphadenopathy in the cervical, axillary or inguinal regions LUNGS: clear to auscultation and  No wheeze or crackles HEART/CVS: regular rate & rhythm and no murmurs; chronic bilateral lower extremity edema. ABDOMEN:abdomen soft, non-tender and normal bowel sounds Musculoskeletal:no cyanosis of digits and no clubbing; right upper extremity lymphedema  noted. PSYCH: alert & oriented x 3 with fluent speech NEURO: no focal motor/sensory deficits SKIN:  no rashes or significant lesions  # Right breast ulcerated/ scabbing lesion noted; no drainage.   LABORATORY DATA:  I have reviewed the data as listed    Component Value Date/Time   NA 135 12/30/2015 1315   NA 142 08/01/2014 1435   K 4.1 12/30/2015 1315   K 3.5 08/01/2014 1435   CL 103 12/30/2015 1315   CL 103 08/01/2014 1435   CO2 25 12/30/2015 1315   CO2 27 08/01/2014 1435   GLUCOSE 132* 12/30/2015 1315   GLUCOSE 148* 08/01/2014 1435   BUN 13 12/30/2015 1315   BUN 13 08/01/2014 1435  CREATININE 0.87 12/30/2015 1315   CREATININE 0.71 10/02/2014 0930   CALCIUM 8.9 12/30/2015 1315   CALCIUM 8.6* 10/02/2014 0930   PROT 7.3 12/30/2015 1315   PROT 7.6 10/02/2014 0930   ALBUMIN 3.4* 12/30/2015 1315   ALBUMIN 3.7 10/02/2014 0930   AST 22 12/30/2015 1315   AST 21 10/02/2014 0930   ALT 14 12/30/2015 1315   ALT 23 10/02/2014 0930   ALKPHOS 58 12/30/2015 1315   ALKPHOS 41 10/02/2014 0930   BILITOT 0.4 12/30/2015 1315   BILITOT 0.5 10/02/2014 0930   GFRNONAA >60 12/30/2015 1315   GFRNONAA >60 10/02/2014 0930   GFRNONAA >60 08/01/2014 1435   GFRAA >60 12/30/2015 1315   GFRAA >60 10/02/2014 0930   GFRAA >60 08/01/2014 1435    No results found for: SPEP, UPEP  Lab Results  Component Value Date   WBC 10.3 12/30/2015   NEUTROABS 7.9* 12/30/2015   HGB 11.3* 12/30/2015   HCT 34.7* 12/30/2015   MCV 80.1 12/30/2015   PLT 306 12/30/2015      Chemistry      Component Value Date/Time   NA 135 12/30/2015 1315   NA 142 08/01/2014 1435   K 4.1 12/30/2015 1315   K 3.5 08/01/2014 1435   CL 103 12/30/2015 1315   CL 103 08/01/2014 1435   CO2 25 12/30/2015 1315   CO2 27 08/01/2014 1435   BUN 13 12/30/2015 1315   BUN 13 08/01/2014 1435   CREATININE 0.87 12/30/2015 1315   CREATININE 0.71 10/02/2014 0930      Component Value Date/Time   CALCIUM 8.9 12/30/2015 1315   CALCIUM  8.6* 10/02/2014 0930   ALKPHOS 58 12/30/2015 1315   ALKPHOS 41 10/02/2014 0930   AST 22 12/30/2015 1315   AST 21 10/02/2014 0930   ALT 14 12/30/2015 1315   ALT 23 10/02/2014 0930   BILITOT 0.4 12/30/2015 1315   BILITOT 0.5 10/02/2014 0930       RADIOGRAPHIC STUDIES: I have personally reviewed the radiological images as listed and agreed with the findings in the report. No results found.   ASSESSMENT & PLAN:  Breast cancer metastasized to bone [facility]) Patient currently on therapy with Eribulin status post cycle #2. Patient tolerating chemotherapy fairly well without any major side effects. Proceed with cycle #3 day 1 today. CBC CMP within normal limits. Clinically no obvious concerns of progression at this time. We'll plan to get imaging- after 4 cycles.  # Lymphedema RIght UE- Secondary to malignancy currently stable. Monitor for now.   # PN- G-1   # follow up in 1 w/ chemo; 3 weeks; chemo/labs-MD   No orders of the defined types were placed in this encounter.   All questions were answered. The patient knows to call the clinic with any problems, questions or concerns.      Cammie Sickle, MD 12/30/2015 2:18 PM        ROS         12/30/2015

## 2015-12-30 NOTE — Progress Notes (Signed)
Patient states she continues to have weak knees.  She is going to PT and feels that she is getting stronger.

## 2015-12-30 NOTE — Assessment & Plan Note (Signed)
Patient currently on therapy with Eribulin status post cycle #2. Patient tolerating chemotherapy fairly well without any major side effects. Proceed with cycle #3 day 1 today. CBC CMP within normal limits. Clinically no obvious concerns of progression at this time. We'll plan to get imaging- after 4 cycles.  # Lymphedema RIght UE- Secondary to malignancy currently stable. Monitor for now.   # PN- G-1   # follow up in 1 w/ chemo; 3 weeks; chemo/labs-MD

## 2015-12-31 LAB — CEA: CEA: 36.7 ng/mL — AB (ref 0.0–4.7)

## 2016-01-06 ENCOUNTER — Inpatient Hospital Stay: Payer: BC Managed Care – PPO

## 2016-01-06 VITALS — BP 114/70 | HR 102 | Temp 99.7°F | Resp 18

## 2016-01-06 DIAGNOSIS — C50911 Malignant neoplasm of unspecified site of right female breast: Secondary | ICD-10-CM

## 2016-01-06 DIAGNOSIS — C7951 Secondary malignant neoplasm of bone: Principal | ICD-10-CM

## 2016-01-06 LAB — BASIC METABOLIC PANEL
ANION GAP: 9 (ref 5–15)
BUN: 12 mg/dL (ref 6–20)
CO2: 22 mmol/L (ref 22–32)
Calcium: 8.8 mg/dL — ABNORMAL LOW (ref 8.9–10.3)
Chloride: 106 mmol/L (ref 101–111)
Creatinine, Ser: 0.69 mg/dL (ref 0.44–1.00)
Glucose, Bld: 149 mg/dL — ABNORMAL HIGH (ref 65–99)
POTASSIUM: 3.9 mmol/L (ref 3.5–5.1)
SODIUM: 137 mmol/L (ref 135–145)

## 2016-01-06 LAB — CBC WITH DIFFERENTIAL/PLATELET
BASOS ABS: 0.1 10*3/uL (ref 0–0.1)
BASOS PCT: 1 %
EOS ABS: 0 10*3/uL (ref 0–0.7)
EOS PCT: 0 %
HCT: 34.2 % — ABNORMAL LOW (ref 35.0–47.0)
HEMOGLOBIN: 11 g/dL — AB (ref 12.0–16.0)
Lymphocytes Relative: 17 %
Lymphs Abs: 1.4 10*3/uL (ref 1.0–3.6)
MCH: 25.8 pg — ABNORMAL LOW (ref 26.0–34.0)
MCHC: 32.2 g/dL (ref 32.0–36.0)
MCV: 80.1 fL (ref 80.0–100.0)
Monocytes Absolute: 0.5 10*3/uL (ref 0.2–0.9)
Monocytes Relative: 6 %
NEUTROS PCT: 76 %
Neutro Abs: 6.4 10*3/uL (ref 1.4–6.5)
PLATELETS: 260 10*3/uL (ref 150–440)
RBC: 4.27 MIL/uL (ref 3.80–5.20)
RDW: 22.4 % — ABNORMAL HIGH (ref 11.5–14.5)
WBC: 8.4 10*3/uL (ref 3.6–11.0)

## 2016-01-06 MED ORDER — SODIUM CHLORIDE 0.9 % IV SOLN
Freq: Once | INTRAVENOUS | Status: AC
  Administered 2016-01-06: 14:00:00 via INTRAVENOUS
  Filled 2016-01-06: qty 1000

## 2016-01-06 MED ORDER — SODIUM CHLORIDE 0.9% FLUSH
10.0000 mL | INTRAVENOUS | Status: DC | PRN
  Administered 2016-01-06: 10 mL
  Filled 2016-01-06: qty 10

## 2016-01-06 MED ORDER — PROCHLORPERAZINE MALEATE 10 MG PO TABS
10.0000 mg | ORAL_TABLET | Freq: Once | ORAL | Status: AC
  Administered 2016-01-06: 10 mg via ORAL
  Filled 2016-01-06: qty 1

## 2016-01-06 MED ORDER — SODIUM CHLORIDE 0.9 % IV SOLN
Freq: Once | INTRAVENOUS | Status: AC
  Administered 2016-01-06: 14:00:00 via INTRAVENOUS
  Filled 2016-01-06: qty 0.4

## 2016-01-06 MED ORDER — SODIUM CHLORIDE 0.9 % IV SOLN
1.4000 mg/m2 | Freq: Once | INTRAVENOUS | Status: AC
  Administered 2016-01-06: 3.6 mg via INTRAVENOUS
  Filled 2016-01-06: qty 7.2

## 2016-01-06 MED ORDER — HEPARIN SOD (PORK) LOCK FLUSH 100 UNIT/ML IV SOLN
500.0000 [IU] | Freq: Once | INTRAVENOUS | Status: DC | PRN
  Filled 2016-01-06: qty 5

## 2016-01-06 MED ORDER — HEPARIN SOD (PORK) LOCK FLUSH 100 UNIT/ML IV SOLN
500.0000 [IU] | Freq: Once | INTRAVENOUS | Status: AC
  Administered 2016-01-06: 500 [IU] via INTRAVENOUS

## 2016-01-13 DIAGNOSIS — C7951 Secondary malignant neoplasm of bone: Principal | ICD-10-CM

## 2016-01-13 DIAGNOSIS — C50911 Malignant neoplasm of unspecified site of right female breast: Secondary | ICD-10-CM

## 2016-01-13 NOTE — Progress Notes (Signed)
Telephone call to patient today for 12 week follow up off ACCRU [id number] protocol. The patient states she is doing well. The only complaint is leg weakness. She started PT 4-5 weeks ago on Tuesdays and Thursdays and states, "it helps a lot with the weakness and I feel I'm improving".  All con meds were reviewed with no changes to report. Patient states Dr. Rogue Bussing will consider treating peripheral neuropathy with a new medication if no improvement. She is open to trying it. The patient started Eribulin on 10/28/2015 and will return next week for Cycle 4. She states only new side effect with Eribulin is the increased weakness of legs and fatigue is about the same as when she was on the study. This nurse thanked the patient for her time and participation in the study. Next follow up will be April 06, 2016. Mirian Mo, RN, BSN 01/13/2016 10:25 AM

## 2016-01-20 ENCOUNTER — Inpatient Hospital Stay: Payer: BC Managed Care – PPO

## 2016-01-20 ENCOUNTER — Inpatient Hospital Stay (HOSPITAL_BASED_OUTPATIENT_CLINIC_OR_DEPARTMENT_OTHER): Payer: BC Managed Care – PPO | Admitting: Internal Medicine

## 2016-01-20 VITALS — BP 129/85 | HR 89 | Temp 98.6°F | Resp 18 | Wt 298.3 lb

## 2016-01-20 DIAGNOSIS — G4730 Sleep apnea, unspecified: Secondary | ICD-10-CM

## 2016-01-20 DIAGNOSIS — E669 Obesity, unspecified: Secondary | ICD-10-CM

## 2016-01-20 DIAGNOSIS — I10 Essential (primary) hypertension: Secondary | ICD-10-CM

## 2016-01-20 DIAGNOSIS — C7951 Secondary malignant neoplasm of bone: Principal | ICD-10-CM

## 2016-01-20 DIAGNOSIS — C50911 Malignant neoplasm of unspecified site of right female breast: Secondary | ICD-10-CM

## 2016-01-20 DIAGNOSIS — Z7982 Long term (current) use of aspirin: Secondary | ICD-10-CM

## 2016-01-20 DIAGNOSIS — I890 Lymphedema, not elsewhere classified: Secondary | ICD-10-CM | POA: Diagnosis not present

## 2016-01-20 DIAGNOSIS — R970 Elevated carcinoembryonic antigen [CEA]: Secondary | ICD-10-CM

## 2016-01-20 DIAGNOSIS — C7970 Secondary malignant neoplasm of unspecified adrenal gland: Secondary | ICD-10-CM | POA: Diagnosis not present

## 2016-01-20 DIAGNOSIS — Z79899 Other long term (current) drug therapy: Secondary | ICD-10-CM

## 2016-01-20 DIAGNOSIS — G629 Polyneuropathy, unspecified: Secondary | ICD-10-CM

## 2016-01-20 DIAGNOSIS — Z7901 Long term (current) use of anticoagulants: Secondary | ICD-10-CM

## 2016-01-20 DIAGNOSIS — N6489 Other specified disorders of breast: Secondary | ICD-10-CM

## 2016-01-20 DIAGNOSIS — I252 Old myocardial infarction: Secondary | ICD-10-CM

## 2016-01-20 DIAGNOSIS — Z87891 Personal history of nicotine dependence: Secondary | ICD-10-CM

## 2016-01-20 DIAGNOSIS — C50811 Malignant neoplasm of overlapping sites of right female breast: Secondary | ICD-10-CM

## 2016-01-20 LAB — COMPREHENSIVE METABOLIC PANEL
ALBUMIN: 3.5 g/dL (ref 3.5–5.0)
ALT: 14 U/L (ref 14–54)
AST: 21 U/L (ref 15–41)
Alkaline Phosphatase: 50 U/L (ref 38–126)
Anion gap: 8 (ref 5–15)
BUN: 10 mg/dL (ref 6–20)
CHLORIDE: 104 mmol/L (ref 101–111)
CO2: 24 mmol/L (ref 22–32)
CREATININE: 0.62 mg/dL (ref 0.44–1.00)
Calcium: 8.7 mg/dL — ABNORMAL LOW (ref 8.9–10.3)
GFR calc Af Amer: >60 mL/min (ref >60)
GLUCOSE: 146 mg/dL — AB (ref 65–99)
POTASSIUM: 4 mmol/L (ref 3.5–5.1)
Sodium: 136 mmol/L (ref 135–145)
Total Bilirubin: 0.6 mg/dL (ref 0.3–1.2)
Total Protein: 7.2 g/dL (ref 6.5–8.1)

## 2016-01-20 LAB — CBC WITH DIFFERENTIAL/PLATELET
BASOS ABS: 0.1 10*3/uL (ref 0–0.1)
BASOS PCT: 1 %
EOS PCT: 0 %
Eosinophils Absolute: 0 10*3/uL (ref 0–0.7)
HCT: 34.7 % — ABNORMAL LOW (ref 35.0–47.0)
Hemoglobin: 11.4 g/dL — ABNORMAL LOW (ref 12.0–16.0)
LYMPHS PCT: 20 %
Lymphs Abs: 1.6 10*3/uL (ref 1.0–3.6)
MCH: 25.9 pg — ABNORMAL LOW (ref 26.0–34.0)
MCHC: 33 g/dL (ref 32.0–36.0)
MCV: 78.5 fL — AB (ref 80.0–100.0)
Monocytes Absolute: 0.8 10*3/uL (ref 0.2–0.9)
Monocytes Relative: 10 %
NEUTROS ABS: 5.6 10*3/uL (ref 1.4–6.5)
Neutrophils Relative %: 69 %
PLATELETS: 315 10*3/uL (ref 150–440)
RBC: 4.41 MIL/uL (ref 3.80–5.20)
RDW: 23 % — ABNORMAL HIGH (ref 11.5–14.5)
WBC: 8.1 10*3/uL (ref 3.6–11.0)

## 2016-01-20 MED ORDER — SODIUM CHLORIDE 0.9 % IV SOLN
Freq: Once | INTRAVENOUS | Status: AC
  Administered 2016-01-20: 15:00:00 via INTRAVENOUS
  Filled 2016-01-20: qty 0.4

## 2016-01-20 MED ORDER — SODIUM CHLORIDE 0.9% FLUSH
10.0000 mL | INTRAVENOUS | Status: AC | PRN
  Administered 2016-01-20: 10 mL via INTRAVENOUS
  Filled 2016-01-20: qty 10

## 2016-01-20 MED ORDER — HEPARIN SOD (PORK) LOCK FLUSH 100 UNIT/ML IV SOLN
500.0000 [IU] | Freq: Once | INTRAVENOUS | Status: AC
  Administered 2016-01-20: 500 [IU] via INTRAVENOUS

## 2016-01-20 MED ORDER — SODIUM CHLORIDE 0.9 % IJ SOLN
10.0000 mL | Freq: Once | INTRAMUSCULAR | Status: AC
  Administered 2016-01-20: 10 mL via INTRAVENOUS
  Filled 2016-01-20: qty 10

## 2016-01-20 MED ORDER — PROCHLORPERAZINE MALEATE 10 MG PO TABS
10.0000 mg | ORAL_TABLET | Freq: Once | ORAL | Status: AC
  Administered 2016-01-20: 10 mg via ORAL
  Filled 2016-01-20: qty 1

## 2016-01-20 MED ORDER — SODIUM CHLORIDE 0.9 % IV SOLN
1.4000 mg/m2 | Freq: Once | INTRAVENOUS | Status: AC
  Administered 2016-01-20: 3.6 mg via INTRAVENOUS
  Filled 2016-01-20: qty 7.2

## 2016-01-20 MED ORDER — GABAPENTIN 300 MG PO CAPS: 300.0000 mg | ORAL_CAPSULE | Freq: Two times a day (BID) | ORAL | 3 refills | Status: DC

## 2016-01-20 MED ORDER — HEPARIN SOD (PORK) LOCK FLUSH 100 UNIT/ML IV SOLN
INTRAVENOUS | Status: AC
  Filled 2016-01-20: qty 5

## 2016-01-20 MED ORDER — SODIUM CHLORIDE 0.9 % IV SOLN
Freq: Once | INTRAVENOUS | Status: AC
  Administered 2016-01-20: 15:00:00 via INTRAVENOUS
  Filled 2016-01-20: qty 1000

## 2016-01-20 NOTE — Progress Notes (Signed)
[place]  Telephone:(336) [phone]  Fax:(336) [phone]     [name] DOB: [birth year]  MR#: [id number]  HQR#:[id number]  Patient Care Team: Kirk Ruths, MD as PCP - General (Internal Medicine)  CHIEF COMPLAINT:  Chief Complaint  Patient presents with  . Breast with mets to bone    [place] OFFICE PROGRESS NOTE  Patient Care Team: Kirk Ruths, MD as PCP - General (Internal Medicine)  Breast cancer metastasized to bone [facility])   Staging form: Breast, AJCC 7th Edition     Clinical stage from 06/27/2013: Stage IV (T4, N2, M1) - Signed by [name], NP on 12/25/2014    Oncology History   1. RIGHT Breast cancer [Neglected] T4 N2 M1 ER positive HER-2 negative, stage IV disease of the right breast with metastases to thoracic spine, right chest wall, adrenal gland. Jan 2015- Letrozole + [name] 2016- CEA-223]  2. July 2016- [facility] study-random Taxol; Progression- CEA ~200s/Imaging  3. April 2017- ERIBULIN  # LYMPHEDEMA of RIGHT UE /Breast ulcerated lesion     Breast cancer metastasized to bone ([place])   06/27/2013 Initial Diagnosis    Breast cancer metastasized to bone      Malignant neoplasm of overlapping sites of right breast ([place])   01/20/2016 Initial Diagnosis    Malignant neoplasm of overlapping sites of right breast [facility])     INTERVAL HISTORY:  A pleasant 60 year old morbidly obese Caucasian female patient with above history of metastatic breast cancer is currently on third line therapy with Eribulin history for follow-up. Patient states her right breast ulcerated lesion is scabbing; healing   Patient complains of worsening tingling and numbness in her bilateral lower extremities- interested in the trial of Neurontin. Patient denies any nausea vomiting.  Her appetite is good. No fever no chills.   REVIEW OF SYSTEMS:  A complete 10 point review of system is done which is negative except mentioned  above/history of present illness.   PAST MEDICAL HISTORY :  Past Medical History:  Diagnosis Date  . Anemia   . Anginal pain ([place])   . Breast cancer ([place])    2014   . Breast cancer metastasized to bone ([place]) 11/21/2014  . Hypertension   . Last menstrual period (LMP) > 10 days ago 2007  . Myocardial infarction ([place])   . Post-menopausal 2006  . Sleep apnea   . Wound drainage     PAST SURGICAL HISTORY :   Past Surgical History:  Procedure Laterality Date  . CORONARY ANGIOPLASTY WITH STENT PLACEMENT N/A   . HERNIA REPAIR     umbilical hernia repair  . PORTACATH PLACEMENT Right 02/03/2015   Procedure: INSERTION PORT-A-CATH;  Surgeon: [name], MD;  Location: [facility] ORS;  Service: General;  Laterality: Right;    FAMILY HISTORY :   Family History  Problem Relation Age of Onset  . Atrial fibrillation Sister   . Hypertension Sister     SOCIAL HISTORY:   Social History  Substance Use Topics  . Smoking status: Former Smoker    Packs/day: 1.00    Types: Cigarettes    Quit date: 10/25/2004  . Smokeless tobacco: Never Used     Comment: stopped smoking greater than 7 years ago  . Alcohol use No    ALLERGIES:  is allergic to no known allergies.  MEDICATIONS:  Current Outpatient Prescriptions  Medication Sig Dispense Refill  . acetaminophen (TYLENOL) 500 MG tablet Take 1,000 mg by mouth every  6 (six) hours as needed.    Marland Kitchen aspirin EC 81 MG tablet Take 81 mg by mouth daily.    . clopidogrel (PLAVIX) 75 MG tablet Take 75 mg by mouth daily.    . [employer] CALCIUM 600+D 600-800 MG-UNIT TABS Take 1 tablet by mouth daily. At noon  3  . fexofenadine (ALLEGRA) 180 MG tablet Take 180 mg by mouth at bedtime.    . furosemide (LASIX) 20 MG tablet Take 20 mg by mouth daily.  1  . lidocaine-prilocaine (EMLA) cream APPLY TOPICALLY AS NEEDED 30 g 2  . lisinopril (PRINIVIL,ZESTRIL) 20 MG tablet Take 20 mg by mouth daily.    . metoprolol succinate (TOPROL-XL) 50 MG 24 hr tablet Take 50 mg by  mouth 2 (two) times daily. Take with or immediately following a meal.    . metroNIDAZOLE (FLAGYL) 500 MG tablet Take 1 tablet (500 mg total) by mouth 3 (three) times daily. 30 tablet 0  . omega-3 acid ethyl esters (LOVAZA) 1 G capsule Take 1 g by mouth at bedtime.    . ondansetron (ZOFRAN) 8 MG tablet TAKE 1 TABLET (8MG) BY MOUTH TWICE DAILY START THE DAY AFTER CHEMOTHERAPY FOR 2 DAYS THEN TAKE AS NEEDED FOR NAUSEA OR VOMITING 30 tablet 0  . PHOSPHA 250 NEUTRAL 155-852-130 MG tablet TAKE 1 TABLET BY MOUTH TWICE DAILY 60 tablet 5  . potassium chloride (K-DUR) 10 MEQ tablet Take 10 mEq by mouth daily.   1  . simvastatin (ZOCOR) 80 MG tablet Take 80 mg by mouth at bedtime.    . gabapentin (NEURONTIN) 300 MG capsule Take 1 capsule (300 mg total) by mouth 2 (two) times daily. 60 capsule 3   No current facility-administered medications for this visit.    Facility-Administered Medications Ordered in Other Visits  Medication Dose Route Frequency Provider Last Rate Last Dose  . sodium chloride 0.9 % injection 10 mL  10 mL Intracatheter PRN Forest Gleason, MD   10 mL at 07/07/15 0911  . sodium chloride flush (NS) 0.9 % injection 10 mL  10 mL Intravenous PRN Forest Gleason, MD      . sodium chloride flush (NS) 0.9 % injection 10 mL  10 mL Intravenous PRN Cammie Sickle, MD   10 mL at 01/20/16 1320    PHYSICAL EXAMINATION: ECOG PERFORMANCE STATUS: 1 - Symptomatic but completely ambulatory  BP 129/85 (BP Location: Left Arm, Patient Position: Sitting)   Pulse 89   Temp 98.6 F (37 C) (Tympanic)   Resp 18   Wt 298 lb 5 oz (135.3 kg)   BMI 48.89 kg/m   Filed Weights   01/20/16 1348  Weight: 298 lb 5 oz (135.3 kg)    GENERAL: Well-nourished well-developed; Alert, no distress and comfortable.   With her husband. Morbidly obese. EYES: no pallor or icterus OROPHARYNX: no thrush or ulceration;  NECK: supple, no masses felt LYMPH:  no palpable lymphadenopathy in the cervical, axillary or inguinal  regions LUNGS: clear to auscultation and  No wheeze or crackles HEART/CVS: regular rate & rhythm and no murmurs; chronic bilateral lower extremity edema. ABDOMEN:abdomen soft, non-tender and normal bowel sounds Musculoskeletal:no cyanosis of digits and no clubbing; right upper extremity lymphedema noted. PSYCH: alert & oriented x 3 with fluent speech NEURO: no focal motor/sensory deficits SKIN:  no rashes or significant lesions  # Right breast ulcerated/ scabbing lesion noted; no drainage.   LABORATORY DATA:  I have reviewed the data as listed    Component Value Date/Time  NA 136 01/20/2016 1319   NA 142 08/01/2014 1435   K 4.0 01/20/2016 1319   K 3.5 08/01/2014 1435   CL 104 01/20/2016 1319   CL 103 08/01/2014 1435   CO2 24 01/20/2016 1319   CO2 27 08/01/2014 1435   GLUCOSE 146 (H) 01/20/2016 1319   GLUCOSE 148 (H) 08/01/2014 1435   BUN 10 01/20/2016 1319   BUN 13 08/01/2014 1435   CREATININE 0.62 01/20/2016 1319   CREATININE 0.71 10/02/2014 0930   CALCIUM 8.7 (L) 01/20/2016 1319   CALCIUM 8.6 (L) 10/02/2014 0930   PROT 7.2 01/20/2016 1319   PROT 7.6 10/02/2014 0930   ALBUMIN 3.5 01/20/2016 1319   ALBUMIN 3.7 10/02/2014 0930   AST 21 01/20/2016 1319   AST 21 10/02/2014 0930   ALT 14 01/20/2016 1319   ALT 23 10/02/2014 0930   ALKPHOS 50 01/20/2016 1319   ALKPHOS 41 10/02/2014 0930   BILITOT 0.6 01/20/2016 1319   BILITOT 0.5 10/02/2014 0930   GFRNONAA >60 01/20/2016 1319   GFRNONAA >60 10/02/2014 0930   GFRAA >60 01/20/2016 1319   GFRAA >60 10/02/2014 0930    No results found for: SPEP, UPEP  Lab Results  Component Value Date   WBC 8.1 01/20/2016   NEUTROABS 5.6 01/20/2016   HGB 11.4 (L) 01/20/2016   HCT 34.7 (L) 01/20/2016   MCV 78.5 (L) 01/20/2016   PLT 315 01/20/2016      Chemistry      Component Value Date/Time   NA 136 01/20/2016 1319   NA 142 08/01/2014 1435   K 4.0 01/20/2016 1319   K 3.5 08/01/2014 1435   CL 104 01/20/2016 1319   CL 103  08/01/2014 1435   CO2 24 01/20/2016 1319   CO2 27 08/01/2014 1435   BUN 10 01/20/2016 1319   BUN 13 08/01/2014 1435   CREATININE 0.62 01/20/2016 1319   CREATININE 0.71 10/02/2014 0930      Component Value Date/Time   CALCIUM 8.7 (L) 01/20/2016 1319   CALCIUM 8.6 (L) 10/02/2014 0930   ALKPHOS 50 01/20/2016 1319   ALKPHOS 41 10/02/2014 0930   AST 21 01/20/2016 1319   AST 21 10/02/2014 0930   ALT 14 01/20/2016 1319   ALT 23 10/02/2014 0930   BILITOT 0.6 01/20/2016 1319   BILITOT 0.5 10/02/2014 0930       RADIOGRAPHIC STUDIES: I have personally reviewed the radiological images as listed and agreed with the findings in the report. No results found.   ASSESSMENT & PLAN:  Malignant neoplasm of overlapping sites of right breast [facility]) Patient currently on therapy with Eribulin status post cycle #3 . Patient tolerating chemotherapy fairly well without any major side effects.   # Proceed with cycle #4 day 1 today. CBC CMP within normal limits. Clinically no obvious concerns of progression at this time.   # Lymphedema RIght UE- Secondary to malignancy currently stable. Monitor for now.   # PN- G-2; recommend Neurontin 300 BID.   # follow up in 1 w/ chemo; 3 weeks; chemo/labs-MD/ CT scan in 2 weeks.   Orders Placed This Encounter  Procedures  . CT ABDOMEN PELVIS W CONTRAST    Standing Status:   Future    Standing Expiration Date:   04/20/2017    Order Specific Question:   Reason for Exam (SYMPTOM  OR DIAGNOSIS REQUIRED)    Answer:   breast cancer    Order Specific Question:   Is the patient pregnant?    Answer:  No    Order Specific Question:   Preferred imaging location?    Answer:   [facility]  . CT CHEST W CONTRAST    Standing Status:   Future    Standing Expiration Date:   03/21/2017    Order Specific Question:   Reason for Exam (SYMPTOM  OR DIAGNOSIS REQUIRED)    Answer:   breast cancer    Order Specific Question:   Is the patient pregnant?    Answer:   No      Order Specific Question:   Preferred imaging location?    Answer:   [facility]  . CEA    Standing Status:   Future    Standing Expiration Date:   01/19/2017  . CBC with Differential    Standing Status:   Future    Standing Expiration Date:   01/19/2017  . Comprehensive metabolic panel    Standing Status:   Future    Standing Expiration Date:   01/19/2017  . CBC with Differential    Standing Status:   Future    Standing Expiration Date:   01/19/2017  . Basic metabolic panel    Standing Status:   Future    Standing Expiration Date:   01/19/2017   All questions were answered. The patient knows to call the clinic with any problems, questions or concerns.      Cammie Sickle, MD 01/20/2016 4:57 PM        ROS         01/20/2016

## 2016-01-20 NOTE — Assessment & Plan Note (Signed)
Patient currently on therapy with Eribulin status post cycle #3 . Patient tolerating chemotherapy fairly well without any major side effects.   # Proceed with cycle #4 day 1 today. CBC CMP within normal limits. Clinically no obvious concerns of progression at this time.   # Lymphedema RIght UE- Secondary to malignancy currently stable. Monitor for now.   # PN- G-2; recommend Neurontin 300 BID.   # follow up in 1 w/ chemo; 3 weeks; chemo/labs-MD/ CT scan in 2 weeks.

## 2016-01-20 NOTE — Progress Notes (Signed)
Appetite decreases a few days after treatment.

## 2016-01-27 ENCOUNTER — Inpatient Hospital Stay: Payer: BC Managed Care – PPO

## 2016-01-27 ENCOUNTER — Inpatient Hospital Stay: Payer: BC Managed Care – PPO | Attending: Internal Medicine

## 2016-01-27 VITALS — BP 107/77 | HR 96 | Temp 97.9°F | Resp 18

## 2016-01-27 DIAGNOSIS — G4730 Sleep apnea, unspecified: Secondary | ICD-10-CM | POA: Diagnosis not present

## 2016-01-27 DIAGNOSIS — C7951 Secondary malignant neoplasm of bone: Secondary | ICD-10-CM | POA: Insufficient documentation

## 2016-01-27 DIAGNOSIS — C50911 Malignant neoplasm of unspecified site of right female breast: Secondary | ICD-10-CM

## 2016-01-27 DIAGNOSIS — I10 Essential (primary) hypertension: Secondary | ICD-10-CM | POA: Diagnosis not present

## 2016-01-27 DIAGNOSIS — Z87891 Personal history of nicotine dependence: Secondary | ICD-10-CM | POA: Diagnosis not present

## 2016-01-27 DIAGNOSIS — I252 Old myocardial infarction: Secondary | ICD-10-CM | POA: Diagnosis not present

## 2016-01-27 DIAGNOSIS — C7970 Secondary malignant neoplasm of unspecified adrenal gland: Secondary | ICD-10-CM | POA: Diagnosis not present

## 2016-01-27 DIAGNOSIS — Z5111 Encounter for antineoplastic chemotherapy: Secondary | ICD-10-CM | POA: Diagnosis not present

## 2016-01-27 DIAGNOSIS — I2699 Other pulmonary embolism without acute cor pulmonale: Secondary | ICD-10-CM | POA: Diagnosis not present

## 2016-01-27 DIAGNOSIS — D649 Anemia, unspecified: Secondary | ICD-10-CM | POA: Insufficient documentation

## 2016-01-27 DIAGNOSIS — R970 Elevated carcinoembryonic antigen [CEA]: Secondary | ICD-10-CM

## 2016-01-27 DIAGNOSIS — Z170 Estrogen receptor positive status [ER+]: Secondary | ICD-10-CM | POA: Diagnosis not present

## 2016-01-27 DIAGNOSIS — Z79899 Other long term (current) drug therapy: Secondary | ICD-10-CM | POA: Insufficient documentation

## 2016-01-27 DIAGNOSIS — C50811 Malignant neoplasm of overlapping sites of right female breast: Secondary | ICD-10-CM | POA: Diagnosis not present

## 2016-01-27 DIAGNOSIS — E669 Obesity, unspecified: Secondary | ICD-10-CM | POA: Insufficient documentation

## 2016-01-27 LAB — CBC WITH DIFFERENTIAL/PLATELET
Basophils Absolute: 0.1 10*3/uL (ref 0–0.1)
Basophils Relative: 1 %
EOS PCT: 0 %
Eosinophils Absolute: 0 10*3/uL (ref 0–0.7)
HEMATOCRIT: 33.6 % — AB (ref 35.0–47.0)
HEMOGLOBIN: 11 g/dL — AB (ref 12.0–16.0)
LYMPHS ABS: 1.6 10*3/uL (ref 1.0–3.6)
LYMPHS PCT: 22 %
MCH: 25.7 pg — ABNORMAL LOW (ref 26.0–34.0)
MCHC: 32.7 g/dL (ref 32.0–36.0)
MCV: 78.7 fL — ABNORMAL LOW (ref 80.0–100.0)
Monocytes Absolute: 0.4 10*3/uL (ref 0.2–0.9)
Monocytes Relative: 5 %
NEUTROS PCT: 72 %
Neutro Abs: 5.3 10*3/uL (ref 1.4–6.5)
Platelets: 308 10*3/uL (ref 150–440)
RBC: 4.27 MIL/uL (ref 3.80–5.20)
RDW: 23.1 % — AB (ref 11.5–14.5)
WBC: 7.3 10*3/uL (ref 3.6–11.0)

## 2016-01-27 LAB — BASIC METABOLIC PANEL
ANION GAP: 8 (ref 5–15)
BUN: 13 mg/dL (ref 6–20)
CALCIUM: 8.8 mg/dL — AB (ref 8.9–10.3)
CO2: 24 mmol/L (ref 22–32)
Chloride: 105 mmol/L (ref 101–111)
Creatinine, Ser: 0.69 mg/dL (ref 0.44–1.00)
GFR calc non Af Amer: >60 mL/min (ref >60)
Glucose, Bld: 139 mg/dL — ABNORMAL HIGH (ref 65–99)
Potassium: 3.7 mmol/L (ref 3.5–5.1)
Sodium: 137 mmol/L (ref 135–145)

## 2016-01-27 MED ORDER — HEPARIN SOD (PORK) LOCK FLUSH 100 UNIT/ML IV SOLN
500.0000 [IU] | Freq: Once | INTRAVENOUS | Status: AC
  Administered 2016-01-27: 500 [IU] via INTRAVENOUS
  Filled 2016-01-27: qty 5

## 2016-01-27 MED ORDER — PROCHLORPERAZINE MALEATE 10 MG PO TABS
10.0000 mg | ORAL_TABLET | Freq: Once | ORAL | Status: AC
  Administered 2016-01-27: 10 mg via ORAL
  Filled 2016-01-27: qty 1

## 2016-01-27 MED ORDER — SODIUM CHLORIDE 0.9 % IV SOLN
Freq: Once | INTRAVENOUS | Status: AC
  Administered 2016-01-27: 14:00:00 via INTRAVENOUS
  Filled 2016-01-27: qty 1000

## 2016-01-27 MED ORDER — ERIBULIN MESYLATE CHEMO INJECTION 1 MG/2ML
1.4000 mg/m2 | Freq: Once | INTRAVENOUS | Status: AC
  Administered 2016-01-27: 3.6 mg via INTRAVENOUS
  Filled 2016-01-27: qty 7.2

## 2016-01-27 MED ORDER — SODIUM CHLORIDE 0.9 % IJ SOLN
10.0000 mL | Freq: Once | INTRAMUSCULAR | Status: AC
Start: 2016-01-27 — End: 2016-01-27
  Administered 2016-01-27: 10 mL via INTRAVENOUS
  Filled 2016-01-27: qty 10

## 2016-01-27 MED ORDER — DEXAMETHASONE SODIUM PHOSPHATE 100 MG/10ML IJ SOLN
Freq: Once | INTRAMUSCULAR | Status: AC
  Administered 2016-01-27: 14:00:00 via INTRAVENOUS
  Filled 2016-01-27: qty 0.4

## 2016-01-28 LAB — CEA: CEA: 37.4 ng/mL — ABNORMAL HIGH (ref 0.0–4.7)

## 2016-02-08 ENCOUNTER — Encounter: Payer: Self-pay | Admitting: *Deleted

## 2016-02-08 ENCOUNTER — Telehealth: Payer: Self-pay

## 2016-02-08 ENCOUNTER — Telehealth: Payer: Self-pay | Admitting: Internal Medicine

## 2016-02-08 ENCOUNTER — Ambulatory Visit
Admission: RE | Admit: 2016-02-08 | Discharge: 2016-02-08 | Disposition: A | Discharge status: Home / Self Care | Payer: BC Managed Care – PPO | Source: Ambulatory Visit | Attending: Internal Medicine | Admitting: Internal Medicine

## 2016-02-08 ENCOUNTER — Other Ambulatory Visit: Payer: Self-pay

## 2016-02-08 ENCOUNTER — Observation Stay
Admission: EM | Admit: 2016-02-08 | Discharge: 2016-02-09 | Disposition: A | Discharge status: Home / Self Care | Payer: BC Managed Care – PPO | Attending: Internal Medicine | Admitting: Internal Medicine

## 2016-02-08 DIAGNOSIS — Z7982 Long term (current) use of aspirin: Secondary | ICD-10-CM | POA: Insufficient documentation

## 2016-02-08 DIAGNOSIS — I890 Lymphedema, not elsewhere classified: Secondary | ICD-10-CM | POA: Diagnosis not present

## 2016-02-08 DIAGNOSIS — N281 Cyst of kidney, acquired: Secondary | ICD-10-CM | POA: Diagnosis not present

## 2016-02-08 DIAGNOSIS — I10 Essential (primary) hypertension: Secondary | ICD-10-CM | POA: Insufficient documentation

## 2016-02-08 DIAGNOSIS — Z8249 Family history of ischemic heart disease and other diseases of the circulatory system: Secondary | ICD-10-CM | POA: Insufficient documentation

## 2016-02-08 DIAGNOSIS — Z7901 Long term (current) use of anticoagulants: Secondary | ICD-10-CM | POA: Insufficient documentation

## 2016-02-08 DIAGNOSIS — I252 Old myocardial infarction: Secondary | ICD-10-CM | POA: Diagnosis not present

## 2016-02-08 DIAGNOSIS — Z79899 Other long term (current) drug therapy: Secondary | ICD-10-CM | POA: Diagnosis not present

## 2016-02-08 DIAGNOSIS — I2510 Atherosclerotic heart disease of native coronary artery without angina pectoris: Secondary | ICD-10-CM | POA: Insufficient documentation

## 2016-02-08 DIAGNOSIS — E119 Type 2 diabetes mellitus without complications: Secondary | ICD-10-CM | POA: Insufficient documentation

## 2016-02-08 DIAGNOSIS — D649 Anemia, unspecified: Secondary | ICD-10-CM | POA: Insufficient documentation

## 2016-02-08 DIAGNOSIS — E785 Hyperlipidemia, unspecified: Secondary | ICD-10-CM | POA: Diagnosis not present

## 2016-02-08 DIAGNOSIS — G622 Polyneuropathy due to other toxic agents: Secondary | ICD-10-CM | POA: Insufficient documentation

## 2016-02-08 DIAGNOSIS — C7951 Secondary malignant neoplasm of bone: Secondary | ICD-10-CM | POA: Insufficient documentation

## 2016-02-08 DIAGNOSIS — G4733 Obstructive sleep apnea (adult) (pediatric): Secondary | ICD-10-CM | POA: Diagnosis not present

## 2016-02-08 DIAGNOSIS — R970 Elevated carcinoembryonic antigen [CEA]: Secondary | ICD-10-CM

## 2016-02-08 DIAGNOSIS — M47814 Spondylosis without myelopathy or radiculopathy, thoracic region: Secondary | ICD-10-CM | POA: Diagnosis not present

## 2016-02-08 DIAGNOSIS — Z955 Presence of coronary angioplasty implant and graft: Secondary | ICD-10-CM | POA: Insufficient documentation

## 2016-02-08 DIAGNOSIS — K5730 Diverticulosis of large intestine without perforation or abscess without bleeding: Secondary | ICD-10-CM | POA: Diagnosis not present

## 2016-02-08 DIAGNOSIS — C50811 Malignant neoplasm of overlapping sites of right female breast: Secondary | ICD-10-CM

## 2016-02-08 DIAGNOSIS — C7972 Secondary malignant neoplasm of left adrenal gland: Secondary | ICD-10-CM | POA: Diagnosis not present

## 2016-02-08 DIAGNOSIS — C50911 Malignant neoplasm of unspecified site of right female breast: Secondary | ICD-10-CM | POA: Diagnosis not present

## 2016-02-08 DIAGNOSIS — Z87891 Personal history of nicotine dependence: Secondary | ICD-10-CM | POA: Diagnosis not present

## 2016-02-08 DIAGNOSIS — I4581 Long QT syndrome: Secondary | ICD-10-CM | POA: Diagnosis not present

## 2016-02-08 DIAGNOSIS — T451X5A Adverse effect of antineoplastic and immunosuppressive drugs, initial encounter: Secondary | ICD-10-CM | POA: Diagnosis not present

## 2016-02-08 DIAGNOSIS — I2699 Other pulmonary embolism without acute cor pulmonale: Secondary | ICD-10-CM | POA: Diagnosis present

## 2016-02-08 LAB — CBC
HEMATOCRIT: 37.5 % (ref 35.0–47.0)
Hemoglobin: 12.1 g/dL (ref 12.0–16.0)
MCH: 25.9 pg — AB (ref 26.0–34.0)
MCHC: 32.2 g/dL (ref 32.0–36.0)
MCV: 80.4 fL (ref 80.0–100.0)
Platelets: 310 10*3/uL (ref 150–440)
RBC: 4.66 MIL/uL (ref 3.80–5.20)
RDW: 23.4 % — AB (ref 11.5–14.5)
WBC: 6.4 10*3/uL (ref 3.6–11.0)

## 2016-02-08 LAB — BASIC METABOLIC PANEL
Anion gap: 8 (ref 5–15)
BUN: 10 mg/dL (ref 6–20)
CO2: 27 mmol/L (ref 22–32)
Calcium: 9.1 mg/dL (ref 8.9–10.3)
Chloride: 104 mmol/L (ref 101–111)
Creatinine, Ser: 0.84 mg/dL (ref 0.44–1.00)
GFR calc Af Amer: >60 mL/min (ref >60)
GLUCOSE: 163 mg/dL — AB (ref 65–99)
POTASSIUM: 3.9 mmol/L (ref 3.5–5.1)
Sodium: 139 mmol/L (ref 135–145)

## 2016-02-08 LAB — TROPONIN I
Troponin I: <0.03 ng/mL (ref <0.03)
Troponin I: <0.03 ng/mL (ref <0.03)

## 2016-02-08 LAB — BRAIN NATRIURETIC PEPTIDE: B NATRIURETIC PEPTIDE 5: 50 pg/mL (ref 0.0–100.0)

## 2016-02-08 MED ORDER — GABAPENTIN 300 MG PO CAPS
300.0000 mg | ORAL_CAPSULE | Freq: Two times a day (BID) | ORAL | Status: DC
  Administered 2016-02-08 – 2016-02-09 (×2): 300 mg via ORAL
  Filled 2016-02-08 (×2): qty 1

## 2016-02-08 MED ORDER — ACETAMINOPHEN 650 MG RE SUPP: 650.0000 mg | Freq: Four times a day (QID) | RECTAL | Status: DC | PRN

## 2016-02-08 MED ORDER — ACETAMINOPHEN 325 MG PO TABS: 650.0000 mg | ORAL_TABLET | Freq: Four times a day (QID) | ORAL | Status: DC | PRN

## 2016-02-08 MED ORDER — ENOXAPARIN SODIUM 150 MG/ML ~~LOC~~ SOLN
1.0000 mg/kg | Freq: Once | SUBCUTANEOUS | Status: AC
  Administered 2016-02-08: 135 mg via SUBCUTANEOUS
  Filled 2016-02-08: qty 0.9

## 2016-02-08 MED ORDER — IOPAMIDOL (ISOVUE-300) INJECTION 61%
100.0000 mL | Freq: Once | INTRAVENOUS | Status: AC | PRN
  Administered 2016-02-08: 100 mL via INTRAVENOUS

## 2016-02-08 MED ORDER — LISINOPRIL 20 MG PO TABS
20.0000 mg | ORAL_TABLET | Freq: Every day | ORAL | Status: DC
  Administered 2016-02-09: 11:00:00 20 mg via ORAL
  Filled 2016-02-08: qty 1

## 2016-02-08 MED ORDER — OMEGA-3-ACID ETHYL ESTERS 1 G PO CAPS
1.0000 g | ORAL_CAPSULE | Freq: Every day | ORAL | Status: DC
  Administered 2016-02-08: 1 g via ORAL
  Filled 2016-02-08: qty 1

## 2016-02-08 MED ORDER — FUROSEMIDE 20 MG PO TABS
20.0000 mg | ORAL_TABLET | Freq: Every day | ORAL | Status: DC
  Administered 2016-02-09: 20 mg via ORAL
  Filled 2016-02-08: qty 1

## 2016-02-08 MED ORDER — ONDANSETRON HCL 4 MG PO TABS: 4.0000 mg | ORAL_TABLET | Freq: Four times a day (QID) | ORAL | Status: DC | PRN

## 2016-02-08 MED ORDER — ASPIRIN EC 81 MG PO TBEC
81.0000 mg | DELAYED_RELEASE_TABLET | Freq: Every day | ORAL | Status: DC
  Administered 2016-02-08 – 2016-02-09 (×2): 81 mg via ORAL
  Filled 2016-02-08 (×2): qty 1

## 2016-02-08 MED ORDER — CLOPIDOGREL BISULFATE 75 MG PO TABS
75.0000 mg | ORAL_TABLET | Freq: Every day | ORAL | Status: DC
Start: 2016-02-09 — End: 2016-02-09
  Administered 2016-02-09: 75 mg via ORAL
  Filled 2016-02-08: qty 1

## 2016-02-08 MED ORDER — ENOXAPARIN SODIUM 150 MG/ML ~~LOC~~ SOLN
1.0000 mg/kg | SUBCUTANEOUS | Status: DC
  Administered 2016-02-09: 09:00:00 135 mg via SUBCUTANEOUS
  Filled 2016-02-08: qty 0.9

## 2016-02-08 MED ORDER — ONDANSETRON HCL 4 MG/2ML IJ SOLN: 4.0000 mg | Freq: Four times a day (QID) | INTRAMUSCULAR | Status: DC | PRN

## 2016-02-08 MED ORDER — SODIUM CHLORIDE 0.9% FLUSH
3.0000 mL | Freq: Two times a day (BID) | INTRAVENOUS | Status: DC
  Administered 2016-02-09: 3 mL via INTRAVENOUS

## 2016-02-08 MED ORDER — POTASSIUM CHLORIDE ER 10 MEQ PO TBCR
10.0000 meq | EXTENDED_RELEASE_TABLET | Freq: Every day | ORAL | Status: DC
  Administered 2016-02-09: 10 meq via ORAL
  Filled 2016-02-08 (×2): qty 1

## 2016-02-08 MED ORDER — CALCIUM CARBONATE-VITAMIN D 500-200 MG-UNIT PO TABS
1.0000 | ORAL_TABLET | Freq: Every day | ORAL | Status: DC
  Administered 2016-02-09: 11:00:00 1 via ORAL
  Filled 2016-02-08: qty 1

## 2016-02-08 MED ORDER — METOPROLOL SUCCINATE ER 50 MG PO TB24
50.0000 mg | ORAL_TABLET | Freq: Two times a day (BID) | ORAL | Status: DC
Start: 2016-02-08 — End: 2016-02-09
  Administered 2016-02-08 – 2016-02-09 (×2): 50 mg via ORAL
  Filled 2016-02-08 (×2): qty 1

## 2016-02-08 MED ORDER — ATORVASTATIN CALCIUM 20 MG PO TABS: 40.0000 mg | ORAL_TABLET | Freq: Every day | ORAL | Status: DC

## 2016-02-08 MED ORDER — OXYCODONE HCL 5 MG PO TABS: 5.0000 mg | ORAL_TABLET | ORAL | Status: DC | PRN

## 2016-02-08 MED ORDER — K PHOS MONO-SOD PHOS DI & MONO 155-852-130 MG PO TABS
250.0000 mg | ORAL_TABLET | Freq: Two times a day (BID) | ORAL | Status: DC
  Administered 2016-02-08 – 2016-02-09 (×2): 250 mg via ORAL
  Filled 2016-02-08 (×2): qty 1

## 2016-02-08 MED ORDER — SIMVASTATIN 40 MG PO TABS
80.0000 mg | ORAL_TABLET | Freq: Every day | ORAL | Status: DC
  Administered 2016-02-08: 80 mg via ORAL
  Filled 2016-02-08: qty 2

## 2016-02-08 MED ORDER — LORATADINE 10 MG PO TABS
10.0000 mg | ORAL_TABLET | Freq: Every day | ORAL | Status: DC
  Administered 2016-02-09: 10 mg via ORAL
  Filled 2016-02-08: qty 1

## 2016-02-08 NOTE — ED Notes (Signed)
Pt sent from PCP for PE shown on CT scan. Pt takes anticoags at home, denies any noncompliance. Pt denies any symptoms such as CP or SOB at this time. Pt currently being treated for breast cancer. Pt A&O

## 2016-02-08 NOTE — Telephone Encounter (Signed)
Called pt per Dr. Jacinto Reap.  Radiology called and pt has PE and needs to be seen in ER. Pt verbalized she will call husband and he will be there to take her to the ER.  She thinks he will take about 30 minutes to get there and take her to the ER.  No other concerns voiced.

## 2016-02-08 NOTE — H&P (Signed)
[facility] NAME: [name]    MR#:  [id number]  DATE OF BIRTH:  24-May-1956   DATE OF ADMISSION:  02/08/2016  PRIMARY CARE PHYSICIAN: Kirk Ruths., MD   REQUESTING/REFERRING PHYSICIAN: Alfred Levins  CHIEF COMPLAINT:   Abnormal findings on CAT scan  HISTORY OF PRESENT ILLNESS:  [name]  is a 60 y.o. female with a known history of Breast cancer with known metastatic disease who is presenting to the hospital after being referred from his oncologist with new findings of pulmonary emboli. Fortunately, the patient denies any chest pain, shortness of breath, symptomatology at this time. Her case is somewhat complicated by the fact that she is already on aspirin and Plavix she was previously on Xarelto but had issues with bleeding and was taken off.  PAST MEDICAL HISTORY:   Past Medical History:  Diagnosis Date  . Anemia   . Anginal pain ([place])   . Breast cancer ([place])    2014   . Breast cancer metastasized to bone ([place]) 11/21/2014  . Hypertension   . Last menstrual period (LMP) > 10 days ago 2007  . Myocardial infarction ([place])   . Post-menopausal 2006  . Sleep apnea   . Wound drainage     PAST SURGICAL HISTORY:   Past Surgical History:  Procedure Laterality Date  . CORONARY ANGIOPLASTY WITH STENT PLACEMENT N/A   . HERNIA REPAIR     umbilical hernia repair  . PORTACATH PLACEMENT Right 02/03/2015   Procedure: INSERTION PORT-A-CATH;  Surgeon: [name], MD;  Location: [facility] ORS;  Service: General;  Laterality: Right;    SOCIAL HISTORY:   Social History  Substance Use Topics  . Smoking status: Former Smoker    Packs/day: 1.00    Types: Cigarettes    Quit date: 10/25/2004  . Smokeless tobacco: Never Used     Comment: stopped smoking greater than 7 years ago  . Alcohol use No    FAMILY HISTORY:   Family History  Problem Relation Age of Onset  . Atrial fibrillation Sister   . Hypertension Sister      DRUG ALLERGIES:   Allergies  Allergen Reactions  . No Known Allergies     REVIEW OF SYSTEMS:  REVIEW OF SYSTEMS:  CONSTITUTIONAL: Denies fevers, chills, fatigue, weakness.  EYES: Denies blurred vision, double vision, or eye pain.  EARS, NOSE, THROAT: Denies tinnitus, ear pain, hearing loss.  RESPIRATORY: denies cough, shortness of breath, wheezing  CARDIOVASCULAR: Denies chest pain, palpitations, edema.  GASTROINTESTINAL: Denies nausea, vomiting, diarrhea, abdominal pain.  GENITOURINARY: Denies dysuria, hematuria.  ENDOCRINE: Denies nocturia or thyroid problems. HEMATOLOGIC AND LYMPHATIC: Denies easy bruising or bleeding.  SKIN: Denies rash or lesions.  MUSCULOSKELETAL: Denies pain in neck, back, shoulder, knees, hips, or further arthritic symptoms.  NEUROLOGIC: Denies paralysis, paresthesias.  PSYCHIATRIC: Denies anxiety or depressive symptoms. Otherwise full review of systems performed by me is negative.   MEDICATIONS AT HOME:   Prior to Admission medications   Medication Sig Start Date End Date Taking? Authorizing Provider  acetaminophen (TYLENOL) 500 MG tablet Take 1,000 mg by mouth every 6 (six) hours as needed.    Historical Provider, MD  aspirin EC 81 MG tablet Take 81 mg by mouth daily.    Historical Provider, MD  clopidogrel (PLAVIX) 75 MG tablet Take 75 mg by mouth daily.    Historical Provider, MD  [employer] CALCIUM 600+D 600-800 MG-UNIT TABS Take 1 tablet by mouth daily. At  noon 10/03/14   Historical Provider, MD  fexofenadine (ALLEGRA) 180 MG tablet Take 180 mg by mouth at bedtime.    Historical Provider, MD  furosemide (LASIX) 20 MG tablet Take 20 mg by mouth daily. 10/17/14   Historical Provider, MD  gabapentin (NEURONTIN) 300 MG capsule Take 1 capsule (300 mg total) by mouth 2 (two) times daily. 01/20/16   Cammie Sickle, MD  lidocaine-prilocaine (EMLA) cream APPLY TOPICALLY AS NEEDED 09/07/15   Forest Gleason, MD  lisinopril (PRINIVIL,ZESTRIL) 20 MG tablet Take  20 mg by mouth daily.    Historical Provider, MD  metoprolol succinate (TOPROL-XL) 50 MG 24 hr tablet Take 50 mg by mouth 2 (two) times daily. Take with or immediately following a meal.    Historical Provider, MD  metroNIDAZOLE (FLAGYL) 500 MG tablet Take 1 tablet (500 mg total) by mouth 3 (three) times daily. 10/21/15   Forest Gleason, MD  omega-3 acid ethyl esters (LOVAZA) 1 G capsule Take 1 g by mouth at bedtime.    Historical Provider, MD  ondansetron (ZOFRAN) 8 MG tablet TAKE 1 TABLET (8MG ) BY MOUTH TWICE DAILY START THE DAY AFTER CHEMOTHERAPY FOR 2 DAYS THEN TAKE AS NEEDED FOR NAUSEA OR VOMITING 12/21/15   Cammie Sickle, MD  PHOSPHA 250 NEUTRAL [phone] MG tablet TAKE 1 TABLET BY MOUTH TWICE DAILY 08/13/15   Forest Gleason, MD  potassium chloride (K-DUR) 10 MEQ tablet Take 10 mEq by mouth daily.  10/17/14   Historical Provider, MD  simvastatin (ZOCOR) 80 MG tablet Take 80 mg by mouth at bedtime.    Historical Provider, MD      VITAL SIGNS:  Blood pressure (!) 145/82, pulse 95, temperature 98.4 F (36.9 C), temperature source Oral, resp. rate 20, height 5\' 5"  (1.651 m), weight 135.2 kg (298 lb), SpO2 96 %.  PHYSICAL EXAMINATION:  VITAL SIGNS: Vitals:   02/08/16 1646  BP: (!) 145/82  Pulse: 95  Resp: 20  Temp: 98.4 F (36.9 C)   GENERAL:60 y.o.female currently in no acute distress.  HEAD: Normocephalic, atraumatic.  EYES: Pupils equal, round, reactive to light. Extraocular muscles intact. No scleral icterus.  MOUTH: Moist mucosal membrane. Dentition intact. No abscess noted.  EAR, NOSE, THROAT: Clear without exudates. No external lesions.  NECK: Supple. No thyromegaly. No nodules. No JVD.  PULMONARY: Clear to ascultation, without wheeze rails or rhonci. No use of accessory muscles, Good respiratory effort. good air entry bilaterally CHEST: Nontender to palpation.  CARDIOVASCULAR: S1 and S2. Regular rate and rhythm. No murmurs, rubs, or gallops. No edema. Pedal pulses 2+  bilaterally.  GASTROINTESTINAL: Soft, nontender, nondistended. No masses. Positive bowel sounds. No hepatosplenomegaly.  MUSCULOSKELETAL: No swelling, clubbing, or edema. Range of motion full in all extremities.  NEUROLOGIC: Cranial nerves II through XII are intact. No gross focal neurological deficits. Sensation intact. Reflexes intact.  SKIN: No ulceration, lesions, rashes, or cyanosis. Skin warm and dry. Turgor intact.  PSYCHIATRIC: Mood, affect within normal limits. The patient is awake, alert and oriented x 3. Insight, judgment intact.    LABORATORY PANEL:   CBC  Recent Labs Lab 02/08/16 1648  WBC 6.4  HGB 12.1  HCT 37.5  PLT 310   ------------------------------------------------------------------------------------------------------------------  Chemistries   Recent Labs Lab 02/08/16 1648  NA 139  K 3.9  CL 104  CO2 27  GLUCOSE 163*  BUN 10  CREATININE 0.84  CALCIUM 9.1   ------------------------------------------------------------------------------------------------------------------  Cardiac Enzymes  Recent Labs Lab 02/08/16 1648  TROPONINI <0.03   ------------------------------------------------------------------------------------------------------------------  RADIOLOGY:  Ct Chest W Contrast  Result Date: 02/08/2016 CLINICAL DATA:  Restaging metastatic right breast cancer with ongoing chemotherapy. Elevated CEA. EXAM: CT CHEST, ABDOMEN, AND PELVIS WITH CONTRAST TECHNIQUE: Multidetector CT imaging of the chest, abdomen and pelvis was performed following the standard protocol during bolus administration of intravenous contrast. CONTRAST:  176mL ISOVUE-300 IOPAMIDOL (ISOVUE-300) INJECTION 61% COMPARISON:  10/15/2015 CT chest, abdomen and pelvis. FINDINGS: CT CHEST FINDINGS Mediastinum/Nodes: Normal heart size. Stable trace pericardial effusion/thickening. Left internal jugular central venous catheter terminates in the upper third the superior vena cava,  unchanged. Left anterior descending, left circumflex and right coronary atherosclerosis. Atherosclerotic nonaneurysmal thoracic aorta. Stable dilated main pulmonary artery (3.4 cm diameter). There is an acute lobar/segmental pulmonary embolus in the left lower lobe. There are probable acute segmental pulmonary emboli in the right upper and right lower lobes. No discrete thyroid nodules. Unremarkable esophagus. There are several (greater than 8) infiltrative soft tissue masses within the right axilla and surrounding the right shoulder girdle involving subcutaneous fat and intramuscular soft tissues, several of which have increased in size. For example a 3.3 x 2.9 cm heterogeneously enhancing anterior right shoulder girdle mass with intramuscular involvement (series 2/image 80), previously 2.3 x 2.0 cm, increased. Inferior right axillary 2.1 x 1.9 cm mass (series 2/ image 25), previously 1.0 x 1.0 cm, increased. A conglomerate right axillary soft tissue mass with internal coarse calcifications measures 3.4 cm in short axis (series 2/image 17), previously 3.5 cm, unchanged. No left axillary adenopathy. No pathologically enlarged mediastinal or hilar lymph nodes. Lungs/Pleura: No pneumothorax. No pleural effusion. No acute consolidative airspace disease, lung masses or significant pulmonary nodules. Mosaic attenuation throughout both lungs is new and probably due to mosaic perfusion from the acute pulmonary embolism. Musculoskeletal: No aggressive appearing focal osseous lesions. Mild-to-moderate thoracic spondylosis. CT ABDOMEN PELVIS FINDINGS Hepatobiliary: Suggestion of diffuse hepatic steatosis. No liver mass. Normal gallbladder with no radiopaque cholelithiasis. No biliary ductal dilatation. Pancreas: Normal, with no mass or duct dilation. Spleen: Normal size. No mass. Adrenals/Urinary Tract: Left adrenal 5.4 x 4.1 cm mass (series 2/ image 62), increased from 4.1 x 2.6 cm. No right adrenal mass. No hydronephrosis.  Parapelvic renal cysts in both renal sinuses. No renal mass. Collapsed and grossly normal bladder. Stomach/Bowel: Grossly normal stomach. Normal caliber small bowel with no small bowel wall thickening. Normal appendix. Marked sigmoid diverticulosis with no large bowel wall thickening or pericolonic fat stranding. Vascular/Lymphatic: Atherosclerotic nonaneurysmal abdominal aorta. Patent portal, splenic, hepatic and renal veins. New enlarged 2.0 cm left inguinal node (series 2/ image 114). Reproductive: Grossly normal uterus.  No adnexal mass. Other: No pneumoperitoneum, ascites or focal fluid collection. Musculoskeletal: No aggressive appearing focal osseous lesions. Moderate to severe lumbar spondylosis, most prominent L5-S1. IMPRESSION: 1. Incidental acute bilateral pulmonary embolism involving lobar and segmental pulmonary arteries. Stable chronic main pulmonary artery dilatation. 2. Interval progression of right shoulder girdle/right axillary soft tissue metastases. 3. 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 4. New left inguinal lymphadenopathy, suspicious for nodal metastasis. 5. Additional findings include stable trace pericardial effusion/thickening, aortic atherosclerosis, three-vessel coronary atherosclerosis, diffuse hepatic steatosis and marked sigmoid diverticulosis. Critical Value/emergent results were called by telephone at the time of interpretation on 02/08/2016 at 3:10 pm to Dr. Charlaine Dalton , who verbally acknowledged these results. Electronically Signed   By: Ilona Sorrel M.D.   On: 02/08/2016 15:15   Ct Abdomen Pelvis W Contrast  Result Date: 02/08/2016 CLINICAL DATA:  Restaging metastatic right breast cancer with ongoing  chemotherapy. Elevated CEA. EXAM: CT CHEST, ABDOMEN, AND PELVIS WITH CONTRAST TECHNIQUE: Multidetector CT imaging of the chest, abdomen and pelvis was performed following the standard protocol during bolus administration of intravenous contrast. CONTRAST:   159mL ISOVUE-300 IOPAMIDOL (ISOVUE-300) INJECTION 61% COMPARISON:  10/15/2015 CT chest, abdomen and pelvis. FINDINGS: CT CHEST FINDINGS Mediastinum/Nodes: Normal heart size. Stable trace pericardial effusion/thickening. Left internal jugular central venous catheter terminates in the upper third the superior vena cava, unchanged. Left anterior descending, left circumflex and right coronary atherosclerosis. Atherosclerotic nonaneurysmal thoracic aorta. Stable dilated main pulmonary artery (3.4 cm diameter). There is an acute lobar/segmental pulmonary embolus in the left lower lobe. There are probable acute segmental pulmonary emboli in the right upper and right lower lobes. No discrete thyroid nodules. Unremarkable esophagus. There are several (greater than 8) infiltrative soft tissue masses within the right axilla and surrounding the right shoulder girdle involving subcutaneous fat and intramuscular soft tissues, several of which have increased in size. For example a 3.3 x 2.9 cm heterogeneously enhancing anterior right shoulder girdle mass with intramuscular involvement (series 2/image 80), previously 2.3 x 2.0 cm, increased. Inferior right axillary 2.1 x 1.9 cm mass (series 2/ image 25), previously 1.0 x 1.0 cm, increased. A conglomerate right axillary soft tissue mass with internal coarse calcifications measures 3.4 cm in short axis (series 2/image 17), previously 3.5 cm, unchanged. No left axillary adenopathy. No pathologically enlarged mediastinal or hilar lymph nodes. Lungs/Pleura: No pneumothorax. No pleural effusion. No acute consolidative airspace disease, lung masses or significant pulmonary nodules. Mosaic attenuation throughout both lungs is new and probably due to mosaic perfusion from the acute pulmonary embolism. Musculoskeletal: No aggressive appearing focal osseous lesions. Mild-to-moderate thoracic spondylosis. CT ABDOMEN PELVIS FINDINGS Hepatobiliary: Suggestion of diffuse hepatic steatosis. No  liver mass. Normal gallbladder with no radiopaque cholelithiasis. No biliary ductal dilatation. Pancreas: Normal, with no mass or duct dilation. Spleen: Normal size. No mass. Adrenals/Urinary Tract: Left adrenal 5.4 x 4.1 cm mass (series 2/ image 62), increased from 4.1 x 2.6 cm. No right adrenal mass. No hydronephrosis. Parapelvic renal cysts in both renal sinuses. No renal mass. Collapsed and grossly normal bladder. Stomach/Bowel: Grossly normal stomach. Normal caliber small bowel with no small bowel wall thickening. Normal appendix. Marked sigmoid diverticulosis with no large bowel wall thickening or pericolonic fat stranding. Vascular/Lymphatic: Atherosclerotic nonaneurysmal abdominal aorta. Patent portal, splenic, hepatic and renal veins. New enlarged 2.0 cm left inguinal node (series 2/ image 114). Reproductive: Grossly normal uterus.  No adnexal mass. Other: No pneumoperitoneum, ascites or focal fluid collection. Musculoskeletal: No aggressive appearing focal osseous lesions. Moderate to severe lumbar spondylosis, most prominent L5-S1. IMPRESSION: 1. Incidental acute bilateral pulmonary embolism involving lobar and segmental pulmonary arteries. Stable chronic main pulmonary artery dilatation. 2. Interval progression of right shoulder girdle/right axillary soft tissue metastases. 3. 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 4. New left inguinal lymphadenopathy, suspicious for nodal metastasis. 5. Additional findings include stable trace pericardial effusion/thickening, aortic atherosclerosis, three-vessel coronary atherosclerosis, diffuse hepatic steatosis and marked sigmoid diverticulosis. Critical Value/emergent results were called by telephone at the time of interpretation on 02/08/2016 at 3:10 pm to Dr. Charlaine Dalton , who verbally acknowledged these results. Electronically Signed   By: Ilona Sorrel M.D.   On: 02/08/2016 15:15    EKG:   Orders placed or performed during the hospital  encounter of 02/08/16  . ED EKG within 10 minutes  . ED EKG within 10 minutes    IMPRESSION AND PLAN:   60 year old Caucasian  female history of metastatic breast cancer presenting with abnormal CT findings  1. Acute pulmonary emboli: Multiple, fortunately she is not very symptomatic however she is somewhat comes K Mariann Laster comes to long-term anticoagulation start therapeutic Lovenox for now we'll consult heme SHE follows with them already in hopes to determine the best agent to discharge her tomorrow. In the meantime can check cardiac enzymes and assess for cardiac damage  2. Essential hypertension: Lisinopril, metoprolol 3. Hyperlipidemia unspecified: Statin therapy 4. Venous thromboembolism prophylactic: Therapeutic Lovenox    All the records are reviewed and case discussed with ED provider. Management plans discussed with the patient, family and they are in agreement.  CODE STATUS: Full  TOTAL TIME TAKING CARE OF THIS PATIENT: 33 minutes.    Hower,  Karenann Cai.D on 02/08/2016 at 5:50 PM  Between 7am to 6pm - Pager - [phone]  After 6pm: House Pager: - [facility]  Office  [phone]  CC: Primary care physician; Kirk Ruths., MD

## 2016-02-08 NOTE — ED Provider Notes (Signed)
[facility] Emergency Department Provider Note  ____________________________________________  Time seen: Approximately 5:27 PM  I have reviewed the triage vital signs and the nursing notes.   HISTORY  Chief Complaint Chest Pain   HPI [name] is a 60 y.o. female history of metastatic breast cancer on chemotherapy, CAD on Plavix, obstructive sleep apnea who presents for a new PE found a CT scan earlier today. Patient's oncologist sent her for CT scan as an outpatient to evaluate responsiveness of the cancer to chemotherapy.CT scan found acute bilateral pulmonary embolisms involving lobar and segmental pulmonary arteries. Patient received a phone call and was instructed to come to the emergency department. Patient denies chest pain, shortness of breath, leg pain or swelling, hemoptysis, syncope. Patient reports that she was on Xarelto about 6 months ago for a clot found at her port however had severe hematuria and was taken off of it. She denies hemoptysis, hematemesis, melena.   Past Medical History:  Diagnosis Date  . Anemia   . Anginal pain ([place])   . Breast cancer ([place])    2014   . Breast cancer metastasized to bone ([place]) 11/21/2014  . Hypertension   . Last menstrual period (LMP) > 10 days ago 2007  . Myocardial infarction ([place])   . Post-menopausal 2006  . Sleep apnea   . Wound drainage     Patient Active Problem List   Diagnosis Date Noted  . Pulmonary emboli ([place]) 02/08/2016  . Malignant neoplasm of overlapping sites of right breast ([place]) 01/20/2016  . Metastasis to bone ([place]) 12/09/2015  . 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 Lymphedema of upper extremity 12/09/2015  . Last menstrual period (LMP) > 10 days ago   . Post-menopausal   . CAD in native artery 01/28/2015  . Diabetes mellitus, type 2 ([place]) 01/28/2015  . Obstructive apnea 01/28/2015  . Type 2 diabetes mellitus ([place]) 01/28/2015  . Breast cancer metastasized to  bone ([place]) 11/21/2014    Past Surgical History:  Procedure Laterality Date  . CORONARY ANGIOPLASTY WITH STENT PLACEMENT N/A   . HERNIA REPAIR     umbilical hernia repair  . PORTACATH PLACEMENT Right 02/03/2015   Procedure: INSERTION PORT-A-CATH;  Surgeon: [name], MD;  Location: [facility] ORS;  Service: General;  Laterality: Right;    Prior to Admission medications   Medication Sig Start Date End Date Taking? Authorizing Provider  acetaminophen (TYLENOL) 500 MG tablet Take 1,000 mg by mouth every 6 (six) hours as needed.   Yes Historical Provider, MD  aspirin EC 81 MG tablet Take 81 mg by mouth daily.   Yes Historical Provider, MD  clopidogrel (PLAVIX) 75 MG tablet Take 75 mg by mouth daily.   Yes Historical Provider, MD  [employer] CALCIUM 600+D 600-800 MG-UNIT TABS Take 1 tablet by mouth daily. At noon 10/03/14  Yes Historical Provider, MD  fexofenadine (ALLEGRA) 180 MG tablet Take 180 mg by mouth daily.    Yes Historical Provider, MD  furosemide (LASIX) 20 MG tablet Take 20 mg by mouth daily. 10/17/14  Yes Historical Provider, MD  gabapentin (NEURONTIN) 300 MG capsule Take 1 capsule (300 mg total) by mouth 2 (two) times daily. 01/20/16  Yes Cammie Sickle, MD  lidocaine-prilocaine (EMLA) cream APPLY TOPICALLY AS NEEDED 09/07/15  Yes Forest Gleason, MD  lisinopril (PRINIVIL,ZESTRIL) 20 MG tablet Take 20 mg by mouth daily.   Yes Historical Provider, MD  metoprolol succinate (TOPROL-XL) 50 MG 24 hr tablet Take 50 mg by  mouth 2 (two) times daily. Take with or immediately following a meal.   Yes Historical Provider, MD  metroNIDAZOLE (FLAGYL) 500 MG tablet Take 500 mg by mouth 3 (three) times daily.   Yes Historical Provider, MD  omega-3 acid ethyl esters (LOVAZA) 1 G capsule Take 1 g by mouth at bedtime.   Yes Historical Provider, MD  ondansetron (ZOFRAN) 8 MG tablet TAKE 1 TABLET (8MG ) BY MOUTH TWICE DAILY START THE DAY AFTER CHEMOTHERAPY FOR 2 DAYS THEN TAKE AS NEEDED FOR NAUSEA OR VOMITING  12/21/15  Yes Cammie Sickle, MD  PHOSPHA 250 NEUTRAL [id number] MG tablet TAKE 1 TABLET BY MOUTH TWICE DAILY 08/13/15  Yes Forest Gleason, MD  potassium chloride (K-DUR) 10 MEQ tablet Take 10 mEq by mouth daily.  10/17/14  Yes Historical Provider, MD  simvastatin (ZOCOR) 80 MG tablet Take 80 mg by mouth at bedtime.   Yes Historical Provider, MD    Allergies No known allergies  Family History  Problem Relation Age of Onset  . Atrial fibrillation Sister   . Hypertension Sister     Social History Social History  Substance Use Topics  . Smoking status: Former Smoker    Packs/day: 1.00    Types: Cigarettes    Quit date: 10/25/2004  . Smokeless tobacco: Never Used     Comment: stopped smoking greater than 7 years ago  . Alcohol use No    Review of Systems  Constitutional: Negative for fever. Eyes: Negative for visual changes. ENT: Negative for sore throat. Cardiovascular: Negative for chest pain. Respiratory: Negative for shortness of breath. Gastrointestinal: Negative for abdominal pain, vomiting or diarrhea. Genitourinary: Negative for dysuria. Musculoskeletal: Negative for back pain. Skin: Negative for rash. Neurological: Negative for headaches, weakness or numbness.  ____________________________________________   PHYSICAL EXAM:  VITAL SIGNS: ED Triage Vitals  Enc Vitals Group     BP 02/08/16 1646 (!) 145/82     Pulse Rate 02/08/16 1646 95     Resp 02/08/16 1646 20     Temp 02/08/16 1646 98.4 F (36.9 C)     Temp Source 02/08/16 1646 Oral     SpO2 02/08/16 1646 96 %     Weight 02/08/16 1646 298 lb (135.2 kg)     Height 02/08/16 1646 5\' 5"  (1.651 m)     Head Circumference --      Peak Flow --      Pain Score 02/08/16 1714 0     Pain Loc --      Pain Edu? --      Excl. in [place]? --     Constitutional: Alert and oriented. Well appearing and in no apparent distress. HEENT:      Head: Normocephalic and atraumatic.         Eyes: Conjunctivae are normal. Sclera  is non-icteric. EOMI. PERRL      Mouth/Throat: Mucous membranes are moist.       Neck: Supple with no signs of meningismus. Cardiovascular: Regular rate and rhythm. No murmurs, gallops, or rubs. 2+ symmetrical distal pulses are present in all extremities. No JVD. Respiratory: Normal respiratory effort. Lungs are clear to auscultation bilaterally. No wheezes, crackles, or rhonchi.  Gastrointestinal: Soft, non tender, and non distended with positive bowel sounds. No rebound or guarding. Genitourinary: No CVA tenderness. Musculoskeletal: 1+ pitting edema on the right lower extremity, no edema on the left.  Neurologic: Normal speech and language. Face is symmetric. Moving all extremities. No gross focal neurologic deficits are appreciated. Skin: Skin  is warm, dry and intact. No rash noted. Psychiatric: Mood and affect are normal. Speech and behavior are normal.  ____________________________________________   LABS (all labs ordered are listed, but only abnormal results are displayed)  Labs Reviewed  BASIC METABOLIC PANEL - Abnormal; Notable for the following:       Result Value   Glucose, Bld 163 (*)    All other components within normal limits  CBC - Abnormal; Notable for the following:    MCH 25.9 (*)    RDW 23.4 (*)    All other components within normal limits  TROPONIN I  BRAIN NATRIURETIC PEPTIDE  TROPONIN I  TROPONIN I  TROPONIN I   ____________________________________________  EKG  ED ECG REPORT I, Rudene Re, the attending physician, personally viewed and interpreted this ECG.  Normal sinus rhythm, rate of 92, prolonged QTC, normal PR QRS intervals, normal axis, no ST elevations or depressions, T-wave inversions on 3 aVF, T-wave flattening her lateral leads. Unchanged from prior ____________________________________________  RADIOLOGY  None  ____________________________________________   PROCEDURES  Procedure(s) performed: None Procedures Critical Care  performed:  Yes  CRITICAL CARE Performed by: Rudene Re  ?  Total critical care time: 35 min  Critical care time was exclusive of separately billable procedures and treating other patients.  Critical care was necessary to treat or prevent imminent or life-threatening deterioration.  Critical care was time spent personally by me on the following activities: development of treatment plan with patient and/or surrogate as well as nursing, discussions with consultants, evaluation of patient's response to treatment, examination of patient, obtaining history from patient or surrogate, ordering and performing treatments and interventions, ordering and review of laboratory studies, ordering and review of radiographic studies, pulse oximetry and re-evaluation of patient's condition.  ____________________________________________   INITIAL IMPRESSION / ASSESSMENT AND PLAN / ED COURSE  60 y.o. female history of metastatic breast cancer on chemotherapy, CAD on Plavix, obstructive sleep apnea who presents for a new bilateral PE found a CT scan earlier today. Patient is asymptomatic with stable vital signs. Troponin is negative. BNP is pending. Patient has a h/o bleeding on Xarelto in the past. Currently on plavix for CAD and stents. Will discuss with Hospitalist for admission. Will give lonenox 1mg /kg subq.  Clinical Course    Pertinent labs & imaging results that were available during my care of the patient were reviewed by me and considered in my medical decision making (see chart for details).    ____________________________________________   FINAL CLINICAL IMPRESSION(S) / ED DIAGNOSES  Final diagnoses:  Other acute pulmonary embolism without acute cor pulmonale (HCC)      NEW MEDICATIONS STARTED DURING THIS VISIT:  Current Discharge Medication List       Note:  This document was prepared using Dragon voice recognition software and may include unintentional dictation  errors.    Rudene Re, MD 02/08/16 2006

## 2016-02-08 NOTE — ED Notes (Signed)
Per floor RN can not transport pt until 1920

## 2016-02-08 NOTE — Telephone Encounter (Signed)
Got a call from radiology re: PE bilateral- moderate cloyt burden; recommend go to ER. Also noted to have progression on disease- on CT. Dr.B

## 2016-02-08 NOTE — ED Triage Notes (Signed)
Pt sent from CT with a possible PE, pt is currently being treated for breast cancer, pt denies chest pain or shortness of breath

## 2016-02-09 ENCOUNTER — Other Ambulatory Visit: Payer: Self-pay | Admitting: *Deleted

## 2016-02-09 DIAGNOSIS — Z853 Personal history of malignant neoplasm of breast: Secondary | ICD-10-CM

## 2016-02-09 LAB — TROPONIN I: Troponin I: <0.03 ng/mL (ref <0.03)

## 2016-02-09 MED ORDER — APIXABAN 5 MG PO TABS: 10.0000 mg | ORAL_TABLET | Freq: Two times a day (BID) | ORAL | Status: DC

## 2016-02-09 MED ORDER — SODIUM CHLORIDE 0.9% FLUSH: 10.0000 mL | INTRAVENOUS | Status: DC | PRN

## 2016-02-09 MED ORDER — HEPARIN SOD (PORK) LOCK FLUSH 100 UNIT/ML IV SOLN
500.0000 [IU] | INTRAVENOUS | Status: DC | PRN
  Filled 2016-02-09: qty 5

## 2016-02-09 MED ORDER — APIXABAN 5 MG PO TABS: 5.0000 mg | ORAL_TABLET | Freq: Two times a day (BID) | ORAL | Status: DC

## 2016-02-09 MED ORDER — APIXABAN 5 MG PO TABS: 5.0000 mg | ORAL_TABLET | Freq: Two times a day (BID) | ORAL | 0 refills | Status: DC

## 2016-02-09 MED ORDER — APIXABAN 5 MG PO TABS: 10.0000 mg | ORAL_TABLET | Freq: Two times a day (BID) | ORAL | 0 refills | Status: DC

## 2016-02-09 NOTE — Discharge Instructions (Addendum)
°  DIET:  °Cardiac diet ° °DISCHARGE CONDITION:  °Good ° °ACTIVITY:  °Activity as tolerated ° °OXYGEN:  °Home Oxygen: No. °  °Oxygen Delivery: room air ° °DISCHARGE LOCATION:  °home  ° ° °ADDITIONAL DISCHARGE INSTRUCTION: ° ° °If you experience worsening of your admission symptoms, develop shortness of breath, life threatening emergency, suicidal or homicidal thoughts you must seek medical attention immediately by calling 911 or calling your MD immediately  if symptoms less severe. ° °You Must read complete instructions/literature along with all the possible adverse reactions/side effects for all the Medicines you take and that have been prescribed to you. Take any new Medicines after you have completely understood and accpet all the possible adverse reactions/side effects.  ° °Please note ° °You were cared for by a hospitalist during your hospital stay. If you have any questions about your discharge medications or the care you received while you were in the hospital after you are discharged, you can call the unit and asked to speak with the hospitalist on call if the hospitalist that took care of you is not available. Once you are discharged, your primary care physician will handle any further medical issues. Please note that NO REFILLS for any discharge medications will be authorized once you are discharged, as it is imperative that you return to your primary care physician (or establish a relationship with a primary care physician if you do not have one) for your aftercare needs so that they can reassess your need for medications and monitor your lab values. ° ° °

## 2016-02-09 NOTE — Progress Notes (Signed)
Patient is discharged home.  Admitted b/c of bilateral pulmonary embolism.  She has not been symptomatic.  Was already on aspirin and plavix.  Will now be on elequis.  Patient will follow up with Dr. Yevette Edwards tomorrow.  Will educate patient on warning signs for PE symptoms.  Patient is good to go home.  Will deaccess port after flushing w/heparin.  Will leave with husband who's at bedside.

## 2016-02-09 NOTE — Discharge Summary (Signed)
[name], 60 y.o., DOB August 13, 1955, MRN [id number]. Admission date: 02/08/2016 Discharge Date 02/09/2016 Primary MD Kirk Ruths., MD Admitting Physician Lytle Butte, MD  Admission Diagnosis  Other acute pulmonary embolism without acute cor pulmonale [facility]) [I26.99]  Discharge Diagnosis   Active Problems:   Pulmonary emboli [facility]) Breast cancer with metastases Sleep apnea  Coronary artery disease Central hypertension Anemia       Hospital Course patient is a 60 year old white female with metastatic breast cancer who had a routine CT scan to follow-up on her breast cancer was noted to have pulmonary embolism. Patient does have history of having pulmonary embolism in the past and was treated with Xarelto however due to hematuria after 3 weeks or Xarelto was discontinued. Patient's subsequent CT scan showed no pulmonary embolism but however the one done recently show pulmonary embolism therefore she was admitted to the hospital. Patient has no chest pain or shortness of breath. I have discussed the case with oncology who recommended starting patient on Eliquis. She has a follow-up appointment for tomorrow. She has not had any bleeding with Lovenox.            Consults  None  Significant Tests:  See full reports for all details    Ct Chest W Contrast  Result Date: 02/08/2016 CLINICAL DATA:  Restaging metastatic right breast cancer with ongoing chemotherapy. Elevated CEA. EXAM: CT CHEST, ABDOMEN, AND PELVIS WITH CONTRAST TECHNIQUE: Multidetector CT imaging of the chest, abdomen and pelvis was performed following the standard protocol during bolus administration of intravenous contrast. CONTRAST:  163mL ISOVUE-300 IOPAMIDOL (ISOVUE-300) INJECTION 61% COMPARISON:  10/15/2015 CT chest, abdomen and pelvis. FINDINGS: CT CHEST FINDINGS Mediastinum/Nodes: Normal heart size. Stable trace pericardial effusion/thickening. Left internal jugular central venous catheter terminates in  the upper third the superior vena cava, unchanged. Left anterior descending, left circumflex and right coronary atherosclerosis. Atherosclerotic nonaneurysmal thoracic aorta. Stable dilated main pulmonary artery (3.4 cm diameter). There is an acute lobar/segmental pulmonary embolus in the left lower lobe. There are probable acute segmental pulmonary emboli in the right upper and right lower lobes. No discrete thyroid nodules. Unremarkable esophagus. There are several (greater than 8) infiltrative soft tissue masses within the right axilla and surrounding the right shoulder girdle involving subcutaneous fat and intramuscular soft tissues, several of which have increased in size. For example a 3.3 x 2.9 cm heterogeneously enhancing anterior right shoulder girdle mass with intramuscular involvement (series 2/image 80), previously 2.3 x 2.0 cm, increased. Inferior right axillary 2.1 x 1.9 cm mass (series 2/ image 25), previously 1.0 x 1.0 cm, increased. A conglomerate right axillary soft tissue mass with internal coarse calcifications measures 3.4 cm in short axis (series 2/image 17), previously 3.5 cm, unchanged. No left axillary adenopathy. No pathologically enlarged mediastinal or hilar lymph nodes. Lungs/Pleura: No pneumothorax. No pleural effusion. No acute consolidative airspace disease, lung masses or significant pulmonary nodules. Mosaic attenuation throughout both lungs is new and probably due to mosaic perfusion from the acute pulmonary embolism. Musculoskeletal: No aggressive appearing focal osseous lesions. Mild-to-moderate thoracic spondylosis. CT ABDOMEN PELVIS FINDINGS Hepatobiliary: Suggestion of diffuse hepatic steatosis. No liver mass. Normal gallbladder with no radiopaque cholelithiasis. No biliary ductal dilatation. Pancreas: Normal, with no mass or duct dilation. Spleen: Normal size. No mass. Adrenals/Urinary Tract: Left adrenal 5.4 x 4.1 cm mass (series 2/ image 62), increased from 4.1 x 2.6 cm. No  right adrenal mass. No hydronephrosis. Parapelvic renal cysts in both renal sinuses. No renal mass. Collapsed and grossly  normal bladder. Stomach/Bowel: Grossly normal stomach. Normal caliber small bowel with no small bowel wall thickening. Normal appendix. Marked sigmoid diverticulosis with no large bowel wall thickening or pericolonic fat stranding. Vascular/Lymphatic: Atherosclerotic nonaneurysmal abdominal aorta. Patent portal, splenic, hepatic and renal veins. New enlarged 2.0 cm left inguinal node (series 2/ image 114). Reproductive: Grossly normal uterus.  No adnexal mass. Other: No pneumoperitoneum, ascites or focal fluid collection. Musculoskeletal: No aggressive appearing focal osseous lesions. Moderate to severe lumbar spondylosis, most prominent L5-S1. IMPRESSION: 1. Incidental acute bilateral pulmonary embolism involving lobar and segmental pulmonary arteries. Stable chronic main pulmonary artery dilatation. 2. Interval progression of right shoulder girdle/right axillary soft tissue metastases. 3. 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 4. New left inguinal lymphadenopathy, suspicious for nodal metastasis. 5. Additional findings include stable trace pericardial effusion/thickening, aortic atherosclerosis, three-vessel coronary atherosclerosis, diffuse hepatic steatosis and marked sigmoid diverticulosis. Critical Value/emergent results were called by telephone at the time of interpretation on 02/08/2016 at 3:10 pm to Dr. Charlaine Dalton , who verbally acknowledged these results. Electronically Signed   By: Ilona Sorrel M.D.   On: 02/08/2016 15:15   Ct Abdomen Pelvis W Contrast  Result Date: 02/08/2016 CLINICAL DATA:  Restaging metastatic right breast cancer with ongoing chemotherapy. Elevated CEA. EXAM: CT CHEST, ABDOMEN, AND PELVIS WITH CONTRAST TECHNIQUE: Multidetector CT imaging of the chest, abdomen and pelvis was performed following the standard protocol during bolus administration  of intravenous contrast. CONTRAST:  126mL ISOVUE-300 IOPAMIDOL (ISOVUE-300) INJECTION 61% COMPARISON:  10/15/2015 CT chest, abdomen and pelvis. FINDINGS: CT CHEST FINDINGS Mediastinum/Nodes: Normal heart size. Stable trace pericardial effusion/thickening. Left internal jugular central venous catheter terminates in the upper third the superior vena cava, unchanged. Left anterior descending, left circumflex and right coronary atherosclerosis. Atherosclerotic nonaneurysmal thoracic aorta. Stable dilated main pulmonary artery (3.4 cm diameter). There is an acute lobar/segmental pulmonary embolus in the left lower lobe. There are probable acute segmental pulmonary emboli in the right upper and right lower lobes. No discrete thyroid nodules. Unremarkable esophagus. There are several (greater than 8) infiltrative soft tissue masses within the right axilla and surrounding the right shoulder girdle involving subcutaneous fat and intramuscular soft tissues, several of which have increased in size. For example a 3.3 x 2.9 cm heterogeneously enhancing anterior right shoulder girdle mass with intramuscular involvement (series 2/image 80), previously 2.3 x 2.0 cm, increased. Inferior right axillary 2.1 x 1.9 cm mass (series 2/ image 25), previously 1.0 x 1.0 cm, increased. A conglomerate right axillary soft tissue mass with internal coarse calcifications measures 3.4 cm in short axis (series 2/image 17), previously 3.5 cm, unchanged. No left axillary adenopathy. No pathologically enlarged mediastinal or hilar lymph nodes. Lungs/Pleura: No pneumothorax. No pleural effusion. No acute consolidative airspace disease, lung masses or significant pulmonary nodules. Mosaic attenuation throughout both lungs is new and probably due to mosaic perfusion from the acute pulmonary embolism. Musculoskeletal: No aggressive appearing focal osseous lesions. Mild-to-moderate thoracic spondylosis. CT ABDOMEN PELVIS FINDINGS Hepatobiliary: Suggestion  of diffuse hepatic steatosis. No liver mass. Normal gallbladder with no radiopaque cholelithiasis. No biliary ductal dilatation. Pancreas: Normal, with no mass or duct dilation. Spleen: Normal size. No mass. Adrenals/Urinary Tract: Left adrenal 5.4 x 4.1 cm mass (series 2/ image 62), increased from 4.1 x 2.6 cm. No right adrenal mass. No hydronephrosis. Parapelvic renal cysts in both renal sinuses. No renal mass. Collapsed and grossly normal bladder. Stomach/Bowel: Grossly normal stomach. Normal caliber small bowel with no small bowel wall thickening. Normal appendix. Marked sigmoid  diverticulosis with no large bowel wall thickening or pericolonic fat stranding. Vascular/Lymphatic: Atherosclerotic nonaneurysmal abdominal aorta. Patent portal, splenic, hepatic and renal veins. New enlarged 2.0 cm left inguinal node (series 2/ image 114). Reproductive: Grossly normal uterus.  No adnexal mass. Other: No pneumoperitoneum, ascites or focal fluid collection. Musculoskeletal: No aggressive appearing focal osseous lesions. Moderate to severe lumbar spondylosis, most prominent L5-S1. IMPRESSION: 1. Incidental acute bilateral pulmonary embolism involving lobar and segmental pulmonary arteries. Stable chronic main pulmonary artery dilatation. 2. Interval progression of right shoulder girdle/right axillary soft tissue metastases. 3. 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 4. New left inguinal lymphadenopathy, suspicious for nodal metastasis. 5. Additional findings include stable trace pericardial effusion/thickening, aortic atherosclerosis, three-vessel coronary atherosclerosis, diffuse hepatic steatosis and marked sigmoid diverticulosis. Critical Value/emergent results were called by telephone at the time of interpretation on 02/08/2016 at 3:10 pm to Dr. Charlaine Dalton , who verbally acknowledged these results. Electronically Signed   By: Ilona Sorrel M.D.   On: 02/08/2016 15:15       Today   Subjective:    [name]  feels well not short of breath or chest pain was to go home  Objective:   Blood pressure 128/68, pulse 80, temperature 98.6 F (37 C), temperature source Oral, resp. rate 18, height 5\' 5"  (1.651 m), weight 135.2 kg (298 lb), SpO2 99 %.  .  Intake/Output Summary (Last 24 hours) at 02/09/16 1322 Last data filed at 02/09/16 0900  Gross per 24 hour  Intake              240 ml  Output                0 ml  Net              240 ml    Exam VITAL SIGNS: Blood pressure 128/68, pulse 80, temperature 98.6 F (37 C), temperature source Oral, resp. rate 18, height 5\' 5"  (1.651 m), weight 135.2 kg (298 lb), SpO2 99 %.  GENERAL:  60 y.o.-year-old patient lying in the bed with no acute distress.  EYES: Pupils equal, round, reactive to light and accommodation. No scleral icterus. Extraocular muscles intact.  HEENT: Head atraumatic, normocephalic. Oropharynx and nasopharynx clear.  NECK:  Supple, no jugular venous distention. No thyroid enlargement, no tenderness.  LUNGS: Normal breath sounds bilaterally, no wheezing, rales,rhonchi or crepitation. No use of accessory muscles of respiration.  CARDIOVASCULAR: S1, S2 normal. No murmurs, rubs, or gallops.  ABDOMEN: Soft, nontender, nondistended. Bowel sounds present. No organomegaly or mass.  EXTREMITIES: No pedal edema, cyanosis, or clubbing.  NEUROLOGIC: Cranial nerves II through XII are intact. Muscle strength 5/5 in all extremities. Sensation intact. Gait not checked.  PSYCHIATRIC: The patient is alert and oriented x 3.  SKIN: No obvious rash, lesion, or ulcer.   Data Review     CBC w Diff:  Lab Results  Component Value Date   WBC 6.4 02/08/2016   HGB 12.1 02/08/2016   HGB 13.7 10/02/2014   HCT 37.5 02/08/2016   HCT 42.8 10/02/2014   PLT 310 02/08/2016   PLT 257 10/02/2014   LYMPHOPCT 22 01/27/2016   LYMPHOPCT 19.4 10/02/2014   MONOPCT 5 01/27/2016   MONOPCT 6.1 10/02/2014   EOSPCT 0 01/27/2016   EOSPCT 3.3  10/02/2014   BASOPCT 1 01/27/2016   BASOPCT 1.5 10/02/2014   CMP:  Lab Results  Component Value Date   NA 139 02/08/2016   NA 142 08/01/2014   K  3.9 02/08/2016   K 3.5 08/01/2014   CL 104 02/08/2016   CL 103 08/01/2014   CO2 27 02/08/2016   CO2 27 08/01/2014   BUN 10 02/08/2016   BUN 13 08/01/2014   CREATININE 0.84 02/08/2016   CREATININE 0.71 10/02/2014   PROT 7.2 01/20/2016   PROT 7.6 10/02/2014   ALBUMIN 3.5 01/20/2016   ALBUMIN 3.7 10/02/2014   BILITOT 0.6 01/20/2016   BILITOT 0.5 10/02/2014   ALKPHOS 50 01/20/2016   ALKPHOS 41 10/02/2014   AST 21 01/20/2016   AST 21 10/02/2014   ALT 14 01/20/2016   ALT 23 10/02/2014  .  Micro Results No results found for this or any previous visit (from the past 240 hour(s)).      Code Status Orders        Start     Ordered   02/08/16 1736  Full code  Continuous     02/08/16 1736    Code Status History    Date Active Date Inactive Code Status Order ID Comments User Context   This patient has a current code status but no historical code status.          Follow-up Information    Cammie Sickle, MD In 1 day.   Specialties:  Internal Medicine, Oncology Why:  has appt tomorrow Contact information: [place] [id number] [phone]           Discharge Medications     Medication List    STOP taking these medications   aspirin EC 81 MG tablet     TAKE these medications   acetaminophen 500 MG tablet Commonly known as:  TYLENOL Take 1,000 mg by mouth every 6 (six) hours as needed.   apixaban 5 MG Tabs tablet Commonly known as:  ELIQUIS Take 2 tablets (10 mg total) by mouth 2 (two) times daily.   apixaban 5 MG Tabs tablet Commonly known as:  ELIQUIS Take 1 tablet (5 mg total) by mouth 2 (two) times daily. Start taking on:  02/17/2016   clopidogrel 75 MG tablet Commonly known as:  PLAVIX Take 75 mg by mouth daily.   [employer] CALCIUM 600+D 600-800 MG-UNIT Tabs Generic  drug:  Calcium Carb-Cholecalciferol Take 1 tablet by mouth daily. At noon   fexofenadine 180 MG tablet Commonly known as:  ALLEGRA Take 180 mg by mouth daily.   furosemide 20 MG tablet Commonly known as:  LASIX Take 20 mg by mouth daily.   gabapentin 300 MG capsule Commonly known as:  NEURONTIN Take 1 capsule (300 mg total) by mouth 2 (two) times daily.   lidocaine-prilocaine cream Commonly known as:  EMLA APPLY TOPICALLY AS NEEDED   lisinopril 20 MG tablet Commonly known as:  PRINIVIL,ZESTRIL Take 20 mg by mouth daily.   metoprolol succinate 50 MG 24 hr tablet Commonly known as:  TOPROL-XL Take 50 mg by mouth 2 (two) times daily. Take with or immediately following a meal.   metroNIDAZOLE 500 MG tablet Commonly known as:  FLAGYL Take 500 mg by mouth 3 (three) times daily.   omega-3 acid ethyl esters 1 g capsule Commonly known as:  LOVAZA Take 1 g by mouth at bedtime.   ondansetron 8 MG tablet Commonly known as:  ZOFRAN TAKE 1 TABLET (8MG ) BY MOUTH TWICE DAILY START THE DAY AFTER CHEMOTHERAPY FOR 2 DAYS THEN TAKE AS NEEDED FOR NAUSEA OR VOMITING   PHOSPHA 250 NEUTRAL [id number] MG tablet Generic drug:  phosphorus TAKE 1 TABLET  BY MOUTH TWICE DAILY   potassium chloride 10 MEQ tablet Commonly known as:  K-DUR Take 10 mEq by mouth daily.   simvastatin 80 MG tablet Commonly known as:  ZOCOR Take 80 mg by mouth at bedtime.          Total Time in preparing paper work, data evaluation and todays exam - 35 minutes  Dustin Flock M.D on 02/09/2016 at 1:22 PM  [facility]   Office  [phone]

## 2016-02-10 ENCOUNTER — Inpatient Hospital Stay: Payer: BC Managed Care – PPO

## 2016-02-10 ENCOUNTER — Inpatient Hospital Stay (HOSPITAL_BASED_OUTPATIENT_CLINIC_OR_DEPARTMENT_OTHER): Payer: BC Managed Care – PPO | Admitting: Internal Medicine

## 2016-02-10 VITALS — BP 133/84 | HR 101 | Temp 99.9°F | Resp 18 | Wt 304.1 lb

## 2016-02-10 DIAGNOSIS — C7970 Secondary malignant neoplasm of unspecified adrenal gland: Secondary | ICD-10-CM

## 2016-02-10 DIAGNOSIS — E669 Obesity, unspecified: Secondary | ICD-10-CM

## 2016-02-10 DIAGNOSIS — Z170 Estrogen receptor positive status [ER+]: Secondary | ICD-10-CM

## 2016-02-10 DIAGNOSIS — Z87891 Personal history of nicotine dependence: Secondary | ICD-10-CM

## 2016-02-10 DIAGNOSIS — Z853 Personal history of malignant neoplasm of breast: Secondary | ICD-10-CM

## 2016-02-10 DIAGNOSIS — C7951 Secondary malignant neoplasm of bone: Secondary | ICD-10-CM

## 2016-02-10 DIAGNOSIS — C50811 Malignant neoplasm of overlapping sites of right female breast: Secondary | ICD-10-CM

## 2016-02-10 DIAGNOSIS — I252 Old myocardial infarction: Secondary | ICD-10-CM

## 2016-02-10 DIAGNOSIS — R970 Elevated carcinoembryonic antigen [CEA]: Secondary | ICD-10-CM

## 2016-02-10 DIAGNOSIS — D649 Anemia, unspecified: Secondary | ICD-10-CM

## 2016-02-10 DIAGNOSIS — G4730 Sleep apnea, unspecified: Secondary | ICD-10-CM

## 2016-02-10 DIAGNOSIS — I10 Essential (primary) hypertension: Secondary | ICD-10-CM

## 2016-02-10 DIAGNOSIS — I2699 Other pulmonary embolism without acute cor pulmonale: Secondary | ICD-10-CM

## 2016-02-10 DIAGNOSIS — Z79899 Other long term (current) drug therapy: Secondary | ICD-10-CM

## 2016-02-10 LAB — COMPREHENSIVE METABOLIC PANEL
ALBUMIN: 3.4 g/dL — AB (ref 3.5–5.0)
ALT: 14 U/L (ref 14–54)
AST: 22 U/L (ref 15–41)
Alkaline Phosphatase: 51 U/L (ref 38–126)
Anion gap: 10 (ref 5–15)
BILIRUBIN TOTAL: 0.5 mg/dL (ref 0.3–1.2)
BUN: 12 mg/dL (ref 6–20)
CHLORIDE: 102 mmol/L (ref 101–111)
CO2: 24 mmol/L (ref 22–32)
CREATININE: 0.71 mg/dL (ref 0.44–1.00)
Calcium: 8.6 mg/dL — ABNORMAL LOW (ref 8.9–10.3)
GFR calc Af Amer: >60 mL/min (ref >60)
GLUCOSE: 147 mg/dL — AB (ref 65–99)
Potassium: 3.9 mmol/L (ref 3.5–5.1)
Sodium: 136 mmol/L (ref 135–145)
TOTAL PROTEIN: 7.3 g/dL (ref 6.5–8.1)

## 2016-02-10 LAB — CBC WITH DIFFERENTIAL/PLATELET
BASOS ABS: 0.1 10*3/uL (ref 0–0.1)
Basophils Relative: 1 %
Eosinophils Absolute: 0 10*3/uL (ref 0–0.7)
Eosinophils Relative: 0 %
HEMATOCRIT: 35.1 % (ref 35.0–47.0)
Hemoglobin: 11.4 g/dL — ABNORMAL LOW (ref 12.0–16.0)
LYMPHS PCT: 16 %
Lymphs Abs: 1.5 10*3/uL (ref 1.0–3.6)
MCH: 25.8 pg — ABNORMAL LOW (ref 26.0–34.0)
MCHC: 32.5 g/dL (ref 32.0–36.0)
MCV: 79.2 fL — AB (ref 80.0–100.0)
Monocytes Absolute: 0.7 10*3/uL (ref 0.2–0.9)
Monocytes Relative: 8 %
NEUTROS ABS: 7 10*3/uL — AB (ref 1.4–6.5)
NEUTROS PCT: 75 %
Platelets: 313 10*3/uL (ref 150–440)
RBC: 4.43 MIL/uL (ref 3.80–5.20)
RDW: 22.6 % — ABNORMAL HIGH (ref 11.5–14.5)
WBC: 9.3 10*3/uL (ref 3.6–11.0)

## 2016-02-10 MED ORDER — SODIUM CHLORIDE 0.9% FLUSH
10.0000 mL | INTRAVENOUS | Status: DC | PRN
  Administered 2016-02-10: 10 mL via INTRAVENOUS
  Filled 2016-02-10: qty 10

## 2016-02-10 MED ORDER — HEPARIN SOD (PORK) LOCK FLUSH 100 UNIT/ML IV SOLN
500.0000 [IU] | Freq: Once | INTRAVENOUS | Status: AC
  Administered 2016-02-10: 500 [IU] via INTRAVENOUS
  Filled 2016-02-10: qty 5

## 2016-02-10 NOTE — Assessment & Plan Note (Addendum)
Patient currently on therapy with Eribulin status post cycle #4 day-8 . Restaging CAT scan shows progressive disease in the right shoulder/underarm; and also the adrenal lesion.  # I recommend switching to further line of treatment- gemcitabine day 1 and 8:15 every 28 days discussed the potential side effects.   # Also get a MUGA scan in anticipation of anthracycline chemotherapy if patient does not respond to gemcitabine.    # PE- Moderate- based on CT imaging incidental. Patient is asymptomatic. Patient is on Eliquis. Tolerating well.   # Lymphedema RIght UE- Secondary to malignancy currently stable. Monitor for now.   # PN- G-2; recommend Neurontin 300 BID.   # Follow-up in approximately 3 weeks/day #15 of cycle #1 gemcitabine; patient started gemcitabine as next week.  # 25 minutes face-to-face with the patient discussing the above plan of care; more than 50% of time spent on prognosis/ natural history; counseling and coordination.   I reviewed the images myself and with the patient and family in detail. A copy of this report was given.

## 2016-02-10 NOTE — Progress Notes (Signed)
Patient continues to feel weak.  D/c from hospital yesterday for blood clot in her lung that was found on CT scan Monday.  Offers no complaints.

## 2016-02-10 NOTE — Progress Notes (Signed)
[name]  Telephone:(336) [phone]  Fax:(336) [phone]     [name] DOB: [birth year]  MR#: [id number]  IWL#:[id number]  Patient Care Team: Kirk Ruths, MD as PCP - General (Internal Medicine)  CHIEF COMPLAINT:  Chief Complaint  Patient presents with  . Breast [place] OFFICE PROGRESS NOTE  Patient Care Team: Kirk Ruths, MD as PCP - General (Internal Medicine)  Breast cancer metastasized to bone [facility])   Staging form: Breast, AJCC 7th Edition     Clinical stage from 06/27/2013: Stage IV (T4, N2, M1) - Signed by [name], NP on 12/25/2014    Oncology History   1. RIGHT Breast cancer [Neglected] T4 N2 M1 ER positive HER-2 negative, stage IV disease of the right breast with metastases to thoracic spine, right chest wall, adrenal gland. Jan 2015- Letrozole + [name] 2016- CEA-223]  2. July 2016- [facility] study-random Taxol; Progression- CEA ~200s/Imaging  3. April 2017- ERIBULIN; AUG 14th 2017- PROGRESSION right shouler girdle/ Left adernal mass  # Feb 17 2016- GEM  # LYMPHEDEMA of RIGHT UE /Breast ulcerated lesion; AUG 2017 - Indidental PE- Eliquis     Breast cancer metastasized to bone ([place])   06/27/2013 Initial Diagnosis    Breast cancer metastasized to bone      Malignant neoplasm of overlapping sites of right breast ([place])   01/20/2016 Initial Diagnosis    Malignant neoplasm of overlapping sites of right breast [facility])     INTERVAL HISTORY:  A pleasant 60 year old morbidly obese Caucasian female patient with above history of metastatic breast cancer is currently on Eribulin is here to review the results of her restaging CAT scan. Incidentally patient was found to have a PE on her restaging CAT scan sent to the emergency room/Hospital admission- start on Lovenox and then changed to Eliquis.  Patient denies any unusual shortness of breath or cough. Denies any pain.  Patient complains of worsening  tingling and numbness in her bilateral lower extremities- not worse. Patient denies any nausea vomiting.  Her appetite is good. No fever no chills.   REVIEW OF SYSTEMS:  A complete 10 point review of system is done which is negative except mentioned above/history of present illness.   PAST MEDICAL HISTORY :  Past Medical History:  Diagnosis Date  . Anemia   . Anginal pain ([place])   . Breast cancer ([place])    2014   . Breast cancer metastasized to bone ([place]) 11/21/2014  . Hypertension   . Last menstrual period (LMP) > 10 days ago 2007  . Myocardial infarction ([place])   . Post-menopausal 2006  . Sleep apnea   . Wound drainage     PAST SURGICAL HISTORY :   Past Surgical History:  Procedure Laterality Date  . CORONARY ANGIOPLASTY WITH STENT PLACEMENT N/A   . HERNIA REPAIR     umbilical hernia repair  . PORTACATH PLACEMENT Right 02/03/2015   Procedure: INSERTION PORT-A-CATH;  Surgeon: [name], MD;  Location: [facility] ORS;  Service: General;  Laterality: Right;    FAMILY HISTORY :   Family History  Problem Relation Age of Onset  . Atrial fibrillation Sister   . Hypertension Sister     SOCIAL HISTORY:   Social History  Substance Use Topics  . Smoking status: Former Smoker    Packs/day: 1.00    Types: Cigarettes    Quit date: 10/25/2004  . Smokeless tobacco: Never Used  Comment: stopped smoking greater than 7 years ago  . Alcohol use No    ALLERGIES:  is allergic to no known allergies.  MEDICATIONS:  Current Outpatient Prescriptions  Medication Sig Dispense Refill  . acetaminophen (TYLENOL) 500 MG tablet Take 1,000 mg by mouth every 6 (six) hours as needed.    Marland Kitchen apixaban (ELIQUIS) 5 MG TABS tablet Take 2 tablets (10 mg total) by mouth 2 (two) times daily. 28 tablet 0  . [START ON 02/17/2016] apixaban (ELIQUIS) 5 MG TABS tablet Take 1 tablet (5 mg total) by mouth 2 (two) times daily. 60 tablet 0  . clopidogrel (PLAVIX) 75 MG tablet Take 75 mg by mouth daily.    . [facility]  CALCIUM 600+D 600-800 MG-UNIT TABS Take 1 tablet by mouth daily. At noon  3  . fexofenadine (ALLEGRA) 180 MG tablet Take 180 mg by mouth daily.     . furosemide (LASIX) 20 MG tablet Take 20 mg by mouth daily.  1  . gabapentin (NEURONTIN) 300 MG capsule Take 1 capsule (300 mg total) by mouth 2 (two) times daily. 60 capsule 3  . lidocaine-prilocaine (EMLA) cream APPLY TOPICALLY AS NEEDED 30 g 2  . lisinopril (PRINIVIL,ZESTRIL) 20 MG tablet Take 20 mg by mouth daily.    . metoprolol succinate (TOPROL-XL) 50 MG 24 hr tablet Take 50 mg by mouth 2 (two) times daily. Take with or immediately following a meal.    . omega-3 acid ethyl esters (LOVAZA) 1 G capsule Take 1 g by mouth at bedtime.    . ondansetron (ZOFRAN) 8 MG tablet TAKE 1 TABLET (8MG) BY MOUTH TWICE DAILY START THE DAY AFTER CHEMOTHERAPY FOR 2 DAYS THEN TAKE AS NEEDED FOR NAUSEA OR VOMITING 30 tablet 0  . PHOSPHA 250 NEUTRAL 155-852-130 MG tablet TAKE 1 TABLET BY MOUTH TWICE DAILY 60 tablet 5  . potassium chloride (K-DUR) 10 MEQ tablet Take 10 mEq by mouth daily.   1  . simvastatin (ZOCOR) 80 MG tablet Take 80 mg by mouth at bedtime.     No current facility-administered medications for this visit.    Facility-Administered Medications Ordered in Other Visits  Medication Dose Route Frequency Provider Last Rate Last Dose  . sodium chloride 0.9 % injection 10 mL  10 mL Intracatheter PRN Forest Gleason, MD   10 mL at 07/07/15 0911  . sodium chloride flush (NS) 0.9 % injection 10 mL  10 mL Intravenous PRN Forest Gleason, MD      . sodium chloride flush (NS) 0.9 % injection 10 mL  10 mL Intravenous PRN Cammie Sickle, MD   10 mL at 01/20/16 1320    PHYSICAL EXAMINATION: ECOG PERFORMANCE STATUS: 1 - Symptomatic but completely ambulatory  BP 133/84 (BP Location: Left Arm, Patient Position: Sitting)   Pulse (!) 101   Temp 99.9 F (37.7 C) (Tympanic)   Resp 18   Wt (!) 304 lb 2 oz (138 kg)   BMI 50.61 kg/m   Filed Weights   02/10/16  1342  Weight: (!) 304 lb 2 oz (138 kg)    GENERAL: Well-nourished well-developed; Alert, no distress and comfortable.   With her husband. Morbidly obese. EYES: no pallor or icterus OROPHARYNX: no thrush or ulceration;  NECK: supple, no masses felt LYMPH:  no palpable lymphadenopathy in the cervical, axillary or inguinal regions LUNGS: clear to auscultation and  No wheeze or crackles HEART/CVS: regular rate & rhythm and no murmurs; chronic bilateral lower extremity edema. ABDOMEN:abdomen soft, non-tender and normal  bowel sounds Musculoskeletal:no cyanosis of digits and no clubbing; right upper extremity lymphedema noted. PSYCH: alert & oriented x 3 with fluent speech NEURO: no focal motor/sensory deficits SKIN:   Right breast ulcerated/ scabbing lesion noted; no drainage. Right anterior shoulder posterior shoulder lesions noted.  LABORATORY DATA:  I have reviewed the data as listed    Component Value Date/Time   NA 136 02/10/2016 1307   NA 142 08/01/2014 1435   K 3.9 02/10/2016 1307   K 3.5 08/01/2014 1435   CL 102 02/10/2016 1307   CL 103 08/01/2014 1435   CO2 24 02/10/2016 1307   CO2 27 08/01/2014 1435   GLUCOSE 147 (H) 02/10/2016 1307   GLUCOSE 148 (H) 08/01/2014 1435   BUN 12 02/10/2016 1307   BUN 13 08/01/2014 1435   CREATININE 0.71 02/10/2016 1307   CREATININE 0.71 10/02/2014 0930   CALCIUM 8.6 (L) 02/10/2016 1307   CALCIUM 8.6 (L) 10/02/2014 0930   PROT 7.3 02/10/2016 1307   PROT 7.6 10/02/2014 0930   ALBUMIN 3.4 (L) 02/10/2016 1307   ALBUMIN 3.7 10/02/2014 0930   AST 22 02/10/2016 1307   AST 21 10/02/2014 0930   ALT 14 02/10/2016 1307   ALT 23 10/02/2014 0930   ALKPHOS 51 02/10/2016 1307   ALKPHOS 41 10/02/2014 0930   BILITOT 0.5 02/10/2016 1307   BILITOT 0.5 10/02/2014 0930   GFRNONAA >60 02/10/2016 1307   GFRNONAA >60 10/02/2014 0930   GFRAA >60 02/10/2016 1307   GFRAA >60 10/02/2014 0930    No results found for: SPEP, UPEP  Lab Results  Component  Value Date   WBC 9.3 02/10/2016   NEUTROABS 7.0 (H) 02/10/2016   HGB 11.4 (L) 02/10/2016   HCT 35.1 02/10/2016   MCV 79.2 (L) 02/10/2016   PLT 313 02/10/2016      Chemistry      Component Value Date/Time   NA 136 02/10/2016 1307   NA 142 08/01/2014 1435   K 3.9 02/10/2016 1307   K 3.5 08/01/2014 1435   CL 102 02/10/2016 1307   CL 103 08/01/2014 1435   CO2 24 02/10/2016 1307   CO2 27 08/01/2014 1435   BUN 12 02/10/2016 1307   BUN 13 08/01/2014 1435   CREATININE 0.71 02/10/2016 1307   CREATININE 0.71 10/02/2014 0930      Component Value Date/Time   CALCIUM 8.6 (L) 02/10/2016 1307   CALCIUM 8.6 (L) 10/02/2014 0930   ALKPHOS 51 02/10/2016 1307   ALKPHOS 41 10/02/2014 0930   AST 22 02/10/2016 1307   AST 21 10/02/2014 0930   ALT 14 02/10/2016 1307   ALT 23 10/02/2014 0930   BILITOT 0.5 02/10/2016 1307   BILITOT 0.5 10/02/2014 0930       RADIOGRAPHIC STUDIES: I have personally reviewed the radiological images as listed and agreed with the findings in the report. No results found.   IMPRESSION: 1. Incidental acute bilateral pulmonary embolism involving lobar and segmental pulmonary arteries. Stable chronic main pulmonary artery dilatation. 2. Interval progression of right shoulder girdle/right axillary soft tissue metastases. 3. 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 4. New left inguinal lymphadenopathy, suspicious for nodal metastasis. 5. Additional findings include stable trace pericardial effusion/thickening, aortic atherosclerosis, three-vessel coronary atherosclerosis, diffuse hepatic steatosis and marked sigmoid diverticulosis.  Critical Value/emergent results were called by telephone at the time of interpretation on 02/08/2016 at 3:10 pm to Dr. Charlaine Dalton , who verbally acknowledged these results.   Electronically Signed   By: Rinaldo Ratel  Poff M.D.   On: 02/08/2016 15:15  ASSESSMENT & PLAN:  Malignant neoplasm of overlapping sites of  right breast [facility]) Patient currently on therapy with Eribulin status post cycle #4 day-8 . Restaging CAT scan shows progressive disease in the right shoulder/underarm; and also the adrenal lesion.  # I recommend switching to further line of treatment- gemcitabine day 1 and 8:15 every 28 days discussed the potential side effects.   # Also get a MUGA scan in anticipation of anthracycline chemotherapy if patient does not respond to gemcitabine.    # PE- Moderate- based on CT imaging incidental. Patient is asymptomatic. Patient is on Eliquis. Tolerating well.   # Lymphedema RIght UE- Secondary to malignancy currently stable. Monitor for now.   # PN- G-2; recommend Neurontin 300 BID.   # Follow-up in approximately 3 weeks/day #15 of cycle #1 gemcitabine; patient started gemcitabine as next week.  # 25 minutes face-to-face with the patient discussing the above plan of care; more than 50% of time spent on prognosis/ natural history; counseling and coordination.   I reviewed the images myself and with the patient and family in detail. A copy of this report was given.   Orders Placed This Encounter  Procedures  . NM Cardiac M[place] Rest    Standing Status:   Future    Standing Expiration Date:   02/09/2017    Order Specific Question:   Reason for Exam (SYMPTOM  OR DIAGNOSIS REQUIRED)    Answer:   breast cancer- cardiotoxic chemo    Order Specific Question:   Preferred imaging location?    Answer:   [facility]    Order Specific Question:   If indicated for the ordered procedure, I authorize the administration of a radiopharmaceutical per Radiology protocol    Answer:   Yes    Order Specific Question:   Is the patient pregnant?    Answer:   No   All questions were answered. The patient knows to call the clinic with any problems, questions or concerns.      Cammie Sickle, MD 02/10/2016 7:40 PM        ROS         02/10/2016

## 2016-02-12 ENCOUNTER — Other Ambulatory Visit: Payer: Self-pay

## 2016-02-12 DIAGNOSIS — C50911 Malignant neoplasm of unspecified site of right female breast: Secondary | ICD-10-CM

## 2016-02-12 DIAGNOSIS — C7951 Secondary malignant neoplasm of bone: Principal | ICD-10-CM

## 2016-02-16 ENCOUNTER — Other Ambulatory Visit: Payer: Self-pay | Admitting: Internal Medicine

## 2016-02-17 ENCOUNTER — Other Ambulatory Visit: Payer: BC Managed Care – PPO

## 2016-02-17 ENCOUNTER — Inpatient Hospital Stay: Payer: BC Managed Care – PPO

## 2016-02-17 VITALS — BP 151/85 | HR 94 | Temp 98.1°F | Resp 18

## 2016-02-17 DIAGNOSIS — C50811 Malignant neoplasm of overlapping sites of right female breast: Secondary | ICD-10-CM

## 2016-02-17 MED ORDER — HEPARIN SOD (PORK) LOCK FLUSH 100 UNIT/ML IV SOLN
500.0000 [IU] | Freq: Once | INTRAVENOUS | Status: AC | PRN
  Administered 2016-02-17: 500 [IU]
  Filled 2016-02-17: qty 5

## 2016-02-17 MED ORDER — SODIUM CHLORIDE 0.9 % IV SOLN
2000.0000 mg | Freq: Once | INTRAVENOUS | Status: AC
  Administered 2016-02-17: 2000 mg via INTRAVENOUS
  Filled 2016-02-17: qty 53

## 2016-02-17 MED ORDER — SODIUM CHLORIDE 0.9% FLUSH
10.0000 mL | INTRAVENOUS | Status: DC | PRN
  Administered 2016-02-17: 10 mL
  Filled 2016-02-17: qty 10

## 2016-02-17 MED ORDER — SODIUM CHLORIDE 0.9 % IV SOLN
Freq: Once | INTRAVENOUS | Status: AC
  Administered 2016-02-17: 14:00:00 via INTRAVENOUS
  Filled 2016-02-17: qty 1000

## 2016-02-17 MED ORDER — PROCHLORPERAZINE MALEATE 10 MG PO TABS
10.0000 mg | ORAL_TABLET | Freq: Once | ORAL | Status: AC
  Administered 2016-02-17: 10 mg via ORAL
  Filled 2016-02-17: qty 1

## 2016-02-23 ENCOUNTER — Other Ambulatory Visit: Payer: Self-pay | Admitting: Internal Medicine

## 2016-02-24 ENCOUNTER — Inpatient Hospital Stay: Payer: BC Managed Care – PPO

## 2016-02-24 VITALS — BP 127/67 | HR 83 | Temp 99.1°F | Resp 18

## 2016-02-24 DIAGNOSIS — C50811 Malignant neoplasm of overlapping sites of right female breast: Secondary | ICD-10-CM

## 2016-02-24 DIAGNOSIS — C50911 Malignant neoplasm of unspecified site of right female breast: Secondary | ICD-10-CM

## 2016-02-24 DIAGNOSIS — C7951 Secondary malignant neoplasm of bone: Principal | ICD-10-CM

## 2016-02-24 LAB — CBC WITH DIFFERENTIAL/PLATELET
Basophils Absolute: 0 10*3/uL (ref 0–0.1)
Basophils Relative: 1 %
EOS ABS: 0.1 10*3/uL (ref 0–0.7)
EOS PCT: 2 %
HCT: 32.9 % — ABNORMAL LOW (ref 35.0–47.0)
Hemoglobin: 11 g/dL — ABNORMAL LOW (ref 12.0–16.0)
LYMPHS ABS: 1.4 10*3/uL (ref 1.0–3.6)
Lymphocytes Relative: 36 %
MCH: 26.6 pg (ref 26.0–34.0)
MCHC: 33.3 g/dL (ref 32.0–36.0)
MCV: 79.9 fL — ABNORMAL LOW (ref 80.0–100.0)
MONO ABS: 0.3 10*3/uL (ref 0.2–0.9)
MONOS PCT: 8 %
Neutro Abs: 2.1 10*3/uL (ref 1.4–6.5)
Neutrophils Relative %: 53 %
PLATELETS: 221 10*3/uL (ref 150–440)
RBC: 4.12 MIL/uL (ref 3.80–5.20)
RDW: 22.4 % — ABNORMAL HIGH (ref 11.5–14.5)
WBC: 3.9 10*3/uL (ref 3.6–11.0)

## 2016-02-24 LAB — COMPREHENSIVE METABOLIC PANEL
ALK PHOS: 54 U/L (ref 38–126)
ALT: 18 U/L (ref 14–54)
ANION GAP: 6 (ref 5–15)
AST: 22 U/L (ref 15–41)
Albumin: 3.3 g/dL — ABNORMAL LOW (ref 3.5–5.0)
BUN: 18 mg/dL (ref 6–20)
CALCIUM: 8.9 mg/dL (ref 8.9–10.3)
CHLORIDE: 106 mmol/L (ref 101–111)
CO2: 23 mmol/L (ref 22–32)
Creatinine, Ser: 0.68 mg/dL (ref 0.44–1.00)
Glucose, Bld: 119 mg/dL — ABNORMAL HIGH (ref 65–99)
Potassium: 4.4 mmol/L (ref 3.5–5.1)
SODIUM: 135 mmol/L (ref 135–145)
Total Bilirubin: 0.4 mg/dL (ref 0.3–1.2)
Total Protein: 7.5 g/dL (ref 6.5–8.1)

## 2016-02-24 MED ORDER — HEPARIN SOD (PORK) LOCK FLUSH 100 UNIT/ML IV SOLN
500.0000 [IU] | Freq: Once | INTRAVENOUS | Status: AC | PRN
  Administered 2016-02-24: 500 [IU]
  Filled 2016-02-24: qty 5

## 2016-02-24 MED ORDER — SODIUM CHLORIDE 0.9 % IV SOLN
Freq: Once | INTRAVENOUS | Status: AC
  Administered 2016-02-24: 14:00:00 via INTRAVENOUS
  Filled 2016-02-24: qty 1000

## 2016-02-24 MED ORDER — PROCHLORPERAZINE MALEATE 10 MG PO TABS
10.0000 mg | ORAL_TABLET | Freq: Once | ORAL | Status: AC
  Administered 2016-02-24: 10 mg via ORAL
  Filled 2016-02-24: qty 1

## 2016-02-24 MED ORDER — SODIUM CHLORIDE 0.9 % IV SOLN
2000.0000 mg | Freq: Once | INTRAVENOUS | Status: AC
  Administered 2016-02-24: 2000 mg via INTRAVENOUS
  Filled 2016-02-24: qty 52.6

## 2016-02-24 MED ORDER — SODIUM CHLORIDE 0.9% FLUSH
10.0000 mL | INTRAVENOUS | Status: DC | PRN
  Administered 2016-02-24: 10 mL
  Filled 2016-02-24: qty 10

## 2016-03-02 ENCOUNTER — Inpatient Hospital Stay: Payer: BC Managed Care – PPO

## 2016-03-02 ENCOUNTER — Encounter: Payer: Self-pay | Admitting: Internal Medicine

## 2016-03-02 ENCOUNTER — Inpatient Hospital Stay (HOSPITAL_BASED_OUTPATIENT_CLINIC_OR_DEPARTMENT_OTHER): Payer: BC Managed Care – PPO | Admitting: Internal Medicine

## 2016-03-02 ENCOUNTER — Inpatient Hospital Stay: Payer: BC Managed Care – PPO | Attending: Internal Medicine

## 2016-03-02 VITALS — BP 139/84 | HR 96 | Temp 96.8°F | Resp 16 | Ht 65.0 in | Wt 295.2 lb

## 2016-03-02 DIAGNOSIS — D649 Anemia, unspecified: Secondary | ICD-10-CM | POA: Insufficient documentation

## 2016-03-02 DIAGNOSIS — I10 Essential (primary) hypertension: Secondary | ICD-10-CM | POA: Insufficient documentation

## 2016-03-02 DIAGNOSIS — C50811 Malignant neoplasm of overlapping sites of right female breast: Secondary | ICD-10-CM | POA: Diagnosis present

## 2016-03-02 DIAGNOSIS — Z79899 Other long term (current) drug therapy: Secondary | ICD-10-CM

## 2016-03-02 DIAGNOSIS — I2699 Other pulmonary embolism without acute cor pulmonale: Secondary | ICD-10-CM | POA: Insufficient documentation

## 2016-03-02 DIAGNOSIS — R591 Generalized enlarged lymph nodes: Secondary | ICD-10-CM | POA: Insufficient documentation

## 2016-03-02 DIAGNOSIS — I252 Old myocardial infarction: Secondary | ICD-10-CM | POA: Diagnosis not present

## 2016-03-02 DIAGNOSIS — C7951 Secondary malignant neoplasm of bone: Secondary | ICD-10-CM | POA: Diagnosis not present

## 2016-03-02 DIAGNOSIS — Z5111 Encounter for antineoplastic chemotherapy: Secondary | ICD-10-CM | POA: Diagnosis not present

## 2016-03-02 DIAGNOSIS — R5383 Other fatigue: Secondary | ICD-10-CM | POA: Diagnosis not present

## 2016-03-02 DIAGNOSIS — E279 Disorder of adrenal gland, unspecified: Secondary | ICD-10-CM | POA: Insufficient documentation

## 2016-03-02 DIAGNOSIS — I209 Angina pectoris, unspecified: Secondary | ICD-10-CM | POA: Diagnosis not present

## 2016-03-02 DIAGNOSIS — G4730 Sleep apnea, unspecified: Secondary | ICD-10-CM | POA: Diagnosis not present

## 2016-03-02 DIAGNOSIS — Z171 Estrogen receptor negative status [ER-]: Secondary | ICD-10-CM | POA: Insufficient documentation

## 2016-03-02 DIAGNOSIS — I890 Lymphedema, not elsewhere classified: Secondary | ICD-10-CM | POA: Insufficient documentation

## 2016-03-02 DIAGNOSIS — E669 Obesity, unspecified: Secondary | ICD-10-CM | POA: Insufficient documentation

## 2016-03-02 DIAGNOSIS — G629 Polyneuropathy, unspecified: Secondary | ICD-10-CM | POA: Insufficient documentation

## 2016-03-02 DIAGNOSIS — Z87891 Personal history of nicotine dependence: Secondary | ICD-10-CM | POA: Diagnosis not present

## 2016-03-02 DIAGNOSIS — C50911 Malignant neoplasm of unspecified site of right female breast: Secondary | ICD-10-CM

## 2016-03-02 LAB — CBC WITH DIFFERENTIAL/PLATELET
BASOS PCT: 0 %
Basophils Absolute: 0 10*3/uL (ref 0–0.1)
EOS ABS: 0 10*3/uL (ref 0–0.7)
EOS PCT: 1 %
HCT: 30.9 % — ABNORMAL LOW (ref 35.0–47.0)
Hemoglobin: 10.1 g/dL — ABNORMAL LOW (ref 12.0–16.0)
LYMPHS ABS: 1.1 10*3/uL (ref 1.0–3.6)
Lymphocytes Relative: 30 %
MCH: 26.4 pg (ref 26.0–34.0)
MCHC: 32.7 g/dL (ref 32.0–36.0)
MCV: 80.8 fL (ref 80.0–100.0)
Monocytes Absolute: 0.2 10*3/uL (ref 0.2–0.9)
Monocytes Relative: 6 %
Neutro Abs: 2.4 10*3/uL (ref 1.4–6.5)
Neutrophils Relative %: 63 %
PLATELETS: 118 10*3/uL — AB (ref 150–440)
RBC: 3.82 MIL/uL (ref 3.80–5.20)
RDW: 23.4 % — ABNORMAL HIGH (ref 11.5–14.5)
WBC: 3.8 10*3/uL (ref 3.6–11.0)

## 2016-03-02 LAB — BASIC METABOLIC PANEL
Anion gap: 9 (ref 5–15)
BUN: 14 mg/dL (ref 6–20)
CO2: 23 mmol/L (ref 22–32)
CREATININE: 0.71 mg/dL (ref 0.44–1.00)
Calcium: 8.6 mg/dL — ABNORMAL LOW (ref 8.9–10.3)
Chloride: 106 mmol/L (ref 101–111)
GFR calc Af Amer: >60 mL/min (ref >60)
Glucose, Bld: 119 mg/dL — ABNORMAL HIGH (ref 65–99)
POTASSIUM: 3.9 mmol/L (ref 3.5–5.1)
SODIUM: 138 mmol/L (ref 135–145)

## 2016-03-02 MED ORDER — SODIUM CHLORIDE 0.9% FLUSH
10.0000 mL | INTRAVENOUS | Status: DC | PRN
  Administered 2016-03-02: 10 mL
  Filled 2016-03-02: qty 10

## 2016-03-02 MED ORDER — HEPARIN SOD (PORK) LOCK FLUSH 100 UNIT/ML IV SOLN
500.0000 [IU] | Freq: Once | INTRAVENOUS | Status: AC | PRN
  Administered 2016-03-02: 500 [IU]
  Filled 2016-03-02: qty 5

## 2016-03-02 MED ORDER — PROCHLORPERAZINE MALEATE 10 MG PO TABS
10.0000 mg | ORAL_TABLET | Freq: Once | ORAL | Status: AC
  Administered 2016-03-02: 10 mg via ORAL
  Filled 2016-03-02: qty 1

## 2016-03-02 MED ORDER — SODIUM CHLORIDE 0.9 % IV SOLN
2000.0000 mg | Freq: Once | INTRAVENOUS | Status: AC
  Administered 2016-03-02: 2000 mg via INTRAVENOUS
  Filled 2016-03-02: qty 53

## 2016-03-02 MED ORDER — SODIUM CHLORIDE 0.9 % IV SOLN
Freq: Once | INTRAVENOUS | Status: AC
  Administered 2016-03-02: 15:00:00 via INTRAVENOUS
  Filled 2016-03-02: qty 1000

## 2016-03-02 NOTE — Assessment & Plan Note (Addendum)
Patient currently on therapy with gemcitabine after restaging Aug 2017- CAT scan shows progressive disease in the right shoulder/underarm; and also the adrenal lesion.  # No significant progression of the disease noted/neither regression of the disease noted at this time. Continue gemcitabine.   # Labs reviewed tolerating chemotherapy well except for mildly low platelets at 120s. Mildly lower hemoglobin at 11. Otherwise tolerating well   # Also get a MUGA scan in anticipation of anthracycline chemotherapy if patient does not respond to gemcitabine.   # PE- Moderate- based on CT imaging incidental. Patient is asymptomatic. Patient is on Eliquis. Tolerating well.  # Lymphedema RIght UE- Secondary to malignancy currently stable. Monitor for now.   # PN- G-2; stable on Neurontin 300 BID.   # Follow with me in approximately 2 weeks with cycle #2 day 1.

## 2016-03-02 NOTE — Progress Notes (Signed)
No changes or concerns this visit.

## 2016-03-02 NOTE — Progress Notes (Signed)
[name]  Telephone:(336) [phone]  Fax:(336) [phone]     [name] DOB: 1955-09-26  MR#: [id number]  XNA#:[id number]  Patient Care Team: Kirk Ruths, MD as PCP - General (Internal Medicine)  CHIEF COMPLAINT:  Chief Complaint  Patient presents with  . Follow-up    Breast [place] OFFICE PROGRESS NOTE  Patient Care Team: Kirk Ruths, MD as PCP - General (Internal Medicine)  Breast cancer metastasized to bone [facility])   Staging form: Breast, AJCC 7th Edition     Clinical stage from 06/27/2013: Stage IV (T4, N2, M1) - Signed by [name], NP on 12/25/2014    Oncology History   1. RIGHT Breast cancer [Neglected] T4 N2 M1 ER positive HER-2 negative, stage IV disease of the right breast with metastases to thoracic spine, right chest wall, adrenal gland. Jan 2015- Letrozole + [name] 2016- CEA-223]  2. July 2016- [facility] study-random Taxol; Progression- CEA ~200s/Imaging  3. April 2017- ERIBULIN; AUG 14th 2017- PROGRESSION right shouler girdle/ Left adernal mass  # Feb 17 2016- GEM  # LYMPHEDEMA of RIGHT UE /Breast ulcerated lesion; AUG 2017 - Indidental PE- Eliquis     Breast cancer metastasized to bone ([place])   06/27/2013 Initial Diagnosis    Breast cancer metastasized to bone       Malignant neoplasm of overlapping sites of right breast ([place])   01/20/2016 Initial Diagnosis    Malignant neoplasm of overlapping sites of right breast [facility])      INTERVAL HISTORY:  A pleasant 60 year old morbidly obese Caucasian female patient with above history of metastatic breast cancer is currently on Gemcitabine- since her August 2017 CT scan showed progressive disease.  Patient denies any unusual shortness of breath or cough. Denies any pain.  Patient complains of worsening tingling and numbness in her bilateral lower extremities- not worse. Patient denies any nausea vomiting.  Her appetite is good. No fever no  chills. Denies any skin rash. Denies any fevers. No bleeding.   REVIEW OF SYSTEMS:  A complete 10 point review of system is done which is negative except mentioned above/history of present illness.   PAST MEDICAL HISTORY :  Past Medical History:  Diagnosis Date  . Anemia   . Anginal pain ([place])   . Breast cancer ([place])    2014   . Breast cancer metastasized to bone ([place]) 11/21/2014  . Hypertension   . Last menstrual period (LMP) > 10 days ago 2007  . Myocardial infarction ([place])   . Post-menopausal 2006  . Sleep apnea   . Wound drainage     PAST SURGICAL HISTORY :   Past Surgical History:  Procedure Laterality Date  . CORONARY ANGIOPLASTY WITH STENT PLACEMENT N/A   . HERNIA REPAIR     umbilical hernia repair  . PORTACATH PLACEMENT Right 02/03/2015   Procedure: INSERTION PORT-A-CATH;  Surgeon: [name], MD;  Location: [facility] ORS;  Service: General;  Laterality: Right;    FAMILY HISTORY :   Family History  Problem Relation Age of Onset  . Atrial fibrillation Sister   . Hypertension Sister     SOCIAL HISTORY:   Social History  Substance Use Topics  . Smoking status: Former Smoker    Packs/day: 1.00    Types: Cigarettes    Quit date: 10/25/2004  . Smokeless tobacco: Never Used     Comment: stopped smoking greater than 7 years ago  . Alcohol use No  ALLERGIES:  is allergic to no known allergies.  MEDICATIONS:  Current Outpatient Prescriptions  Medication Sig Dispense Refill  . acetaminophen (TYLENOL) 500 MG tablet Take 1,000 mg by mouth every 6 (six) hours as needed.    Marland Kitchen apixaban (ELIQUIS) 5 MG TABS tablet Take 1 tablet (5 mg total) by mouth 2 (two) times daily. 60 tablet 0  . clopidogrel (PLAVIX) 75 MG tablet Take 75 mg by mouth daily.    . [facility] CALCIUM 600+D 600-800 MG-UNIT TABS Take 1 tablet by mouth daily. At noon  3  . fexofenadine (ALLEGRA) 180 MG tablet Take 180 mg by mouth daily.     . furosemide (LASIX) 20 MG tablet Take 20 mg by mouth daily.  1  .  gabapentin (NEURONTIN) 300 MG capsule Take 1 capsule (300 mg total) by mouth 2 (two) times daily. 60 capsule 3  . lisinopril (PRINIVIL,ZESTRIL) 20 MG tablet Take 20 mg by mouth daily.    . metoprolol succinate (TOPROL-XL) 50 MG 24 hr tablet Take 50 mg by mouth 2 (two) times daily. Take with or immediately following a meal.    . omega-3 acid ethyl esters (LOVAZA) 1 G capsule Take 1 g by mouth at bedtime.    . ondansetron (ZOFRAN) 8 MG tablet TAKE 1 TABLET (8MG) BY MOUTH TWICE DAILY START THE DAY AFTER CHEMOTHERAPY FOR 2 DAYS THEN TAKE AS NEEDED FOR NAUSEA OR VOMITING 30 tablet 0  . PHOSPHA 250 NEUTRAL 155-852-130 MG tablet TAKE 1 TABLET BY MOUTH TWICE DAILY 60 tablet 5  . potassium chloride (K-DUR) 10 MEQ tablet Take 10 mEq by mouth daily.   1  . simvastatin (ZOCOR) 80 MG tablet Take 80 mg by mouth at bedtime.    Marland Kitchen apixaban (ELIQUIS) 5 MG TABS tablet Take 2 tablets (10 mg total) by mouth 2 (two) times daily. 28 tablet 0  . lidocaine-prilocaine (EMLA) cream APPLY TOPICALLY AS NEEDED (Patient not taking: Reported on 03/02/2016) 30 g 2   No current facility-administered medications for this visit.    Facility-Administered Medications Ordered in Other Visits  Medication Dose Route Frequency Provider Last Rate Last Dose  . sodium chloride 0.9 % injection 10 mL  10 mL Intracatheter PRN Forest Gleason, MD   10 mL at 07/07/15 0911  . sodium chloride flush (NS) 0.9 % injection 10 mL  10 mL Intravenous PRN Forest Gleason, MD      . sodium chloride flush (NS) 0.9 % injection 10 mL  10 mL Intravenous PRN Cammie Sickle, MD   10 mL at 01/20/16 1320  . sodium chloride flush (NS) 0.9 % injection 10 mL  10 mL Intracatheter PRN Cammie Sickle, MD   10 mL at 03/02/16 1515    PHYSICAL EXAMINATION: ECOG PERFORMANCE STATUS: 1 - Symptomatic but completely ambulatory  BP 139/84 (BP Location: Left Arm, Patient Position: Sitting)   Pulse 96   Temp (!) 96.8 F (36 C) (Tympanic)   Resp 16   Ht _0  (1.651 m)    Wt 295 lb 3.2 oz (133.9 kg)   BMI 49.12 kg/m   Filed Weights   03/02/16 1400  Weight: 295 lb 3.2 oz (133.9 kg)    GENERAL: Well-nourished well-developed; Alert, no distress and comfortable.  She is alone. . Morbidly obese. EYES: no pallor or icterus OROPHARYNX: no thrush or ulceration;  NECK: supple, no masses felt LYMPH:  no palpable lymphadenopathy in the cervical, axillary or inguinal regions LUNGS: clear to auscultation and  No wheeze or  crackles HEART/CVS: regular rate & rhythm and no murmurs; chronic bilateral lower extremity edema. ABDOMEN:abdomen soft, non-tender and normal bowel sounds Musculoskeletal:no cyanosis of digits and no clubbing; right upper extremity lymphedema noted. PSYCH: alert & oriented x 3 with fluent speech NEURO: no focal motor/sensory deficits SKIN:   Right breast ulcerated/ scabbing lesion noted; no drainage. Right anterior shoulder posterior shoulder lesions [~3 x3 inches in size] noted.  LABORATORY DATA:  I have reviewed the data as listed    Component Value Date/Time   NA 138 03/02/2016 1343   NA 142 08/01/2014 1435   K 3.9 03/02/2016 1343   K 3.5 08/01/2014 1435   CL 106 03/02/2016 1343   CL 103 08/01/2014 1435   CO2 23 03/02/2016 1343   CO2 27 08/01/2014 1435   GLUCOSE 119 (H) 03/02/2016 1343   GLUCOSE 148 (H) 08/01/2014 1435   BUN 14 03/02/2016 1343   BUN 13 08/01/2014 1435   CREATININE 0.71 03/02/2016 1343   CREATININE 0.71 10/02/2014 0930   CALCIUM 8.6 (L) 03/02/2016 1343   CALCIUM 8.6 (L) 10/02/2014 0930   PROT 7.5 02/24/2016 1333   PROT 7.6 10/02/2014 0930   ALBUMIN 3.3 (L) 02/24/2016 1333   ALBUMIN 3.7 10/02/2014 0930   AST 22 02/24/2016 1333   AST 21 10/02/2014 0930   ALT 18 02/24/2016 1333   ALT 23 10/02/2014 0930   ALKPHOS 54 02/24/2016 1333   ALKPHOS 41 10/02/2014 0930   BILITOT 0.4 02/24/2016 1333   BILITOT 0.5 10/02/2014 0930   GFRNONAA >60 03/02/2016 1343   GFRNONAA >60 10/02/2014 0930   GFRAA >60 03/02/2016  1343   GFRAA >60 10/02/2014 0930    No results found for: SPEP, UPEP  Lab Results  Component Value Date   WBC 3.8 03/02/2016   NEUTROABS 2.4 03/02/2016   HGB 10.1 (L) 03/02/2016   HCT 30.9 (L) 03/02/2016   MCV 80.8 03/02/2016   PLT 118 (L) 03/02/2016      Chemistry      Component Value Date/Time   NA 138 03/02/2016 1343   NA 142 08/01/2014 1435   K 3.9 03/02/2016 1343   K 3.5 08/01/2014 1435   CL 106 03/02/2016 1343   CL 103 08/01/2014 1435   CO2 23 03/02/2016 1343   CO2 27 08/01/2014 1435   BUN 14 03/02/2016 1343   BUN 13 08/01/2014 1435   CREATININE 0.71 03/02/2016 1343   CREATININE 0.71 10/02/2014 0930      Component Value Date/Time   CALCIUM 8.6 (L) 03/02/2016 1343   CALCIUM 8.6 (L) 10/02/2014 0930   ALKPHOS 54 02/24/2016 1333   ALKPHOS 41 10/02/2014 0930   AST 22 02/24/2016 1333   AST 21 10/02/2014 0930   ALT 18 02/24/2016 1333   ALT 23 10/02/2014 0930   BILITOT 0.4 02/24/2016 1333   BILITOT 0.5 10/02/2014 0930       RADIOGRAPHIC STUDIES: I have personally reviewed the radiological images as listed and agreed with the findings in the report. No results found.   IMPRESSION: 1. Incidental acute bilateral pulmonary embolism involving lobar and segmental pulmonary arteries. Stable chronic main pulmonary artery dilatation. 2. Interval progression of right shoulder girdle/right axillary soft tissue metastases. 3. 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 4. New left inguinal lymphadenopathy, suspicious for nodal metastasis. 5. Additional findings include stable trace pericardial effusion/thickening, aortic atherosclerosis, three-vessel coronary atherosclerosis, diffuse hepatic steatosis and marked sigmoid diverticulosis.  Critical Value/emergent results were called by telephone at the time of interpretation  on 02/08/2016 at 3:10 pm to Dr. Charlaine Dalton , who verbally acknowledged these results.   Electronically Signed   By: Ilona Sorrel M.D.   On: 02/08/2016 15:15  ASSESSMENT & PLAN:  Malignant neoplasm of overlapping sites of right breast [facility]) Patient currently on therapy with gemcitabine after restaging Aug 2017- CAT scan shows progressive disease in the right shoulder/underarm; and also the adrenal lesion.  # No significant progression of the disease noted/neither regression of the disease noted at this time. Continue gemcitabine.   # Labs reviewed tolerating chemotherapy well except for mildly low platelets at 120s. Mildly lower hemoglobin at 11. Otherwise tolerating well   # Also get a MUGA scan in anticipation of anthracycline chemotherapy if patient does not respond to gemcitabine.   # PE- Moderate- based on CT imaging incidental. Patient is asymptomatic. Patient is on Eliquis. Tolerating well.  # Lymphedema RIght UE- Secondary to malignancy currently stable. Monitor for now.   # PN- G-2; stable on Neurontin 300 BID.   # Follow with me in approximately 2 weeks with cycle #2 day 1.   No orders of the defined types were placed in this encounter.  All questions were answered. The patient knows to call the clinic with any problems, questions or concerns.      Cammie Sickle, MD 03/02/2016 6:09 PM        Review of Systems  All other systems reviewed and are negative.          03/02/2016

## 2016-03-03 ENCOUNTER — Other Ambulatory Visit: Payer: Self-pay

## 2016-03-03 DIAGNOSIS — C50911 Malignant neoplasm of unspecified site of right female breast: Secondary | ICD-10-CM

## 2016-03-03 DIAGNOSIS — C7951 Secondary malignant neoplasm of bone: Principal | ICD-10-CM

## 2016-03-07 ENCOUNTER — Other Ambulatory Visit: Payer: Self-pay | Admitting: Oncology

## 2016-03-08 ENCOUNTER — Other Ambulatory Visit: Payer: Self-pay | Admitting: *Deleted

## 2016-03-08 MED ORDER — APIXABAN 5 MG PO TABS: 5.0000 mg | ORAL_TABLET | Freq: Two times a day (BID) | ORAL | 0 refills | Status: DC

## 2016-03-14 ENCOUNTER — Encounter: Admission: RE | Admit: 2016-03-14 | Payer: BC Managed Care – PPO | Source: Ambulatory Visit

## 2016-03-15 ENCOUNTER — Encounter
Admission: RE | Admit: 2016-03-15 | Discharge: 2016-03-15 | Disposition: A | Discharge status: Home / Self Care | Payer: BC Managed Care – PPO | Source: Ambulatory Visit | Attending: Internal Medicine | Admitting: Internal Medicine

## 2016-03-15 DIAGNOSIS — C50811 Malignant neoplasm of overlapping sites of right female breast: Secondary | ICD-10-CM | POA: Diagnosis present

## 2016-03-15 MED ORDER — TECHNETIUM TC 99M-LABELED RED BLOOD CELLS IV KIT
22.0000 | PACK | Freq: Once | INTRAVENOUS | Status: AC | PRN
  Administered 2016-03-15: 20.98 via INTRAVENOUS

## 2016-03-16 ENCOUNTER — Inpatient Hospital Stay: Payer: BC Managed Care – PPO

## 2016-03-16 ENCOUNTER — Encounter: Payer: Self-pay | Admitting: Internal Medicine

## 2016-03-16 ENCOUNTER — Inpatient Hospital Stay (HOSPITAL_BASED_OUTPATIENT_CLINIC_OR_DEPARTMENT_OTHER): Payer: BC Managed Care – PPO | Admitting: Internal Medicine

## 2016-03-16 VITALS — BP 164/77 | HR 101 | Temp 98.6°F | Resp 20 | Ht 65.0 in | Wt 295.4 lb

## 2016-03-16 DIAGNOSIS — C50911 Malignant neoplasm of unspecified site of right female breast: Secondary | ICD-10-CM

## 2016-03-16 DIAGNOSIS — R5383 Other fatigue: Secondary | ICD-10-CM

## 2016-03-16 DIAGNOSIS — C50811 Malignant neoplasm of overlapping sites of right female breast: Secondary | ICD-10-CM | POA: Diagnosis not present

## 2016-03-16 DIAGNOSIS — C7951 Secondary malignant neoplasm of bone: Secondary | ICD-10-CM | POA: Diagnosis not present

## 2016-03-16 DIAGNOSIS — R970 Elevated carcinoembryonic antigen [CEA]: Secondary | ICD-10-CM

## 2016-03-16 DIAGNOSIS — G4730 Sleep apnea, unspecified: Secondary | ICD-10-CM

## 2016-03-16 DIAGNOSIS — R591 Generalized enlarged lymph nodes: Secondary | ICD-10-CM | POA: Diagnosis not present

## 2016-03-16 DIAGNOSIS — I10 Essential (primary) hypertension: Secondary | ICD-10-CM

## 2016-03-16 DIAGNOSIS — Z79899 Other long term (current) drug therapy: Secondary | ICD-10-CM

## 2016-03-16 DIAGNOSIS — E669 Obesity, unspecified: Secondary | ICD-10-CM

## 2016-03-16 DIAGNOSIS — E279 Disorder of adrenal gland, unspecified: Secondary | ICD-10-CM

## 2016-03-16 DIAGNOSIS — I252 Old myocardial infarction: Secondary | ICD-10-CM

## 2016-03-16 DIAGNOSIS — I2699 Other pulmonary embolism without acute cor pulmonale: Secondary | ICD-10-CM

## 2016-03-16 DIAGNOSIS — Z87891 Personal history of nicotine dependence: Secondary | ICD-10-CM

## 2016-03-16 DIAGNOSIS — D649 Anemia, unspecified: Secondary | ICD-10-CM

## 2016-03-16 DIAGNOSIS — I209 Angina pectoris, unspecified: Secondary | ICD-10-CM

## 2016-03-16 DIAGNOSIS — I890 Lymphedema, not elsewhere classified: Secondary | ICD-10-CM

## 2016-03-16 DIAGNOSIS — G629 Polyneuropathy, unspecified: Secondary | ICD-10-CM

## 2016-03-16 LAB — COMPREHENSIVE METABOLIC PANEL
ALT: 43 U/L (ref 14–54)
ANION GAP: 7 (ref 5–15)
AST: 42 U/L — ABNORMAL HIGH (ref 15–41)
Albumin: 3.3 g/dL — ABNORMAL LOW (ref 3.5–5.0)
Alkaline Phosphatase: 44 U/L (ref 38–126)
BILIRUBIN TOTAL: 0.4 mg/dL (ref 0.3–1.2)
BUN: 13 mg/dL (ref 6–20)
CALCIUM: 8.8 mg/dL — AB (ref 8.9–10.3)
CO2: 25 mmol/L (ref 22–32)
Chloride: 107 mmol/L (ref 101–111)
Creatinine, Ser: 0.66 mg/dL (ref 0.44–1.00)
GFR calc Af Amer: >60 mL/min (ref >60)
Glucose, Bld: 136 mg/dL — ABNORMAL HIGH (ref 65–99)
POTASSIUM: 4.1 mmol/L (ref 3.5–5.1)
Sodium: 139 mmol/L (ref 135–145)
TOTAL PROTEIN: 7.1 g/dL (ref 6.5–8.1)

## 2016-03-16 LAB — CBC WITH DIFFERENTIAL/PLATELET
Basophils Absolute: 0 10*3/uL (ref 0–0.1)
Basophils Relative: 1 %
Eosinophils Absolute: 0.1 10*3/uL (ref 0–0.7)
Eosinophils Relative: 2 %
HEMATOCRIT: 33 % — AB (ref 35.0–47.0)
Hemoglobin: 10.7 g/dL — ABNORMAL LOW (ref 12.0–16.0)
LYMPHS ABS: 1.6 10*3/uL (ref 1.0–3.6)
LYMPHS PCT: 19 %
MCH: 27.4 pg (ref 26.0–34.0)
MCHC: 32.5 g/dL (ref 32.0–36.0)
MCV: 84.3 fL (ref 80.0–100.0)
MONO ABS: 0.9 10*3/uL (ref 0.2–0.9)
MONOS PCT: 10 %
NEUTROS ABS: 5.7 10*3/uL (ref 1.4–6.5)
Neutrophils Relative %: 68 %
Platelets: 534 10*3/uL — ABNORMAL HIGH (ref 150–440)
RBC: 3.92 MIL/uL (ref 3.80–5.20)
RDW: 26.5 % — AB (ref 11.5–14.5)
WBC: 8.3 10*3/uL (ref 3.6–11.0)

## 2016-03-16 MED ORDER — PROCHLORPERAZINE MALEATE 10 MG PO TABS
10.0000 mg | ORAL_TABLET | Freq: Once | ORAL | Status: AC
  Administered 2016-03-16: 10 mg via ORAL
  Filled 2016-03-16: qty 1

## 2016-03-16 MED ORDER — SODIUM CHLORIDE 0.9 % IV SOLN
Freq: Once | INTRAVENOUS | Status: AC
  Administered 2016-03-16: 15:00:00 via INTRAVENOUS
  Filled 2016-03-16: qty 1000

## 2016-03-16 MED ORDER — HEPARIN SOD (PORK) LOCK FLUSH 100 UNIT/ML IV SOLN
500.0000 [IU] | Freq: Once | INTRAVENOUS | Status: AC | PRN
  Administered 2016-03-16: 500 [IU]
  Filled 2016-03-16: qty 5

## 2016-03-16 MED ORDER — APIXABAN 5 MG PO TABS: 5.0000 mg | ORAL_TABLET | Freq: Two times a day (BID) | ORAL | 3 refills | Status: DC

## 2016-03-16 MED ORDER — GABAPENTIN 300 MG PO CAPS: ORAL_CAPSULE | ORAL | 3 refills | Status: DC

## 2016-03-16 MED ORDER — SODIUM CHLORIDE 0.9 % IV SOLN
2000.0000 mg | Freq: Once | INTRAVENOUS | Status: AC
  Administered 2016-03-16: 2000 mg via INTRAVENOUS
  Filled 2016-03-16: qty 53

## 2016-03-16 NOTE — Progress Notes (Signed)
[place]  Telephone:(336) [phone]  Fax:(336) [phone]     [name] DOB: 05/20/1956  MR#: [id number]  LKG#:[id number]  Patient Care Team: Kirk Ruths, MD as PCP - General (Internal Medicine)  CHIEF COMPLAINT:  Chief Complaint  Patient presents with  . Follow-up    Breast [place] OFFICE PROGRESS NOTE  Patient Care Team: Kirk Ruths, MD as PCP - General (Internal Medicine)  Breast cancer metastasized to bone [facility])   Staging form: Breast, AJCC 7th Edition     Clinical stage from 06/27/2013: Stage IV (T4, N2, M1) - Signed by [name], NP on 12/25/2014    Oncology History   1. RIGHT Breast cancer [Neglected] T4 N2 M1 ER positive HER-2 negative, stage IV disease of the right breast with metastases to thoracic spine, right chest wall, adrenal gland. Jan 2015- Letrozole + [name] [place] [facility] 2016- CEA-223]  2. July 2016- [facility] study-random Taxol; Progression- CEA ~200s/Imaging  3. April 2017- ERIBULIN; AUG 14th 2017- PROGRESSION right shouler girdle/ Left adernal mass  # Feb 17 2016- GEM  # LYMPHEDEMA of RIGHT UE /Breast ulcerated lesion; AUG 2017 - Indidental PE- Eliquis     Breast cancer metastasized to bone ([place])   06/27/2013 Initial Diagnosis    Breast cancer metastasized to bone       Malignant neoplasm of overlapping sites of right breast ([place])   01/20/2016 Initial Diagnosis    Malignant neoplasm of overlapping sites of right breast [facility])      INTERVAL HISTORY:  A pleasant 60 year old morbidly obese Caucasian female patient with above history of metastatic breast cancer is currently on Gemcitabine- since her August 2017 CT scan showed progressive disease. Patient is currently status post 1 cycle.  Patient complains of fatigue- which is being better in the last weeks chest pain not getting the gemcitabine. She has episode of low-grade temperature otherwise no chills. No skin rash. She possibly  thinks her breast in the shoulder lesions are mildly smaller.    Patient denies any unusual shortness of breath or cough. Denies any pain. Tingling and numbness in extremities is improved. On Neurontin. Patient denies any nausea vomiting.  Her appetite is good.   REVIEW OF SYSTEMS:  A complete 10 point review of system is done which is negative except mentioned above/history of present illness.   PAST MEDICAL HISTORY :  Past Medical History:  Diagnosis Date  . Anemia   . Anginal pain ([place])   . Breast cancer ([place])    2014   . Breast cancer metastasized to bone ([place]) 11/21/2014  . Hypertension   . Last menstrual period (LMP) > 10 days ago 2007  . Myocardial infarction ([place])   . Post-menopausal 2006  . Sleep apnea   . Wound drainage     PAST SURGICAL HISTORY :   Past Surgical History:  Procedure Laterality Date  . CORONARY ANGIOPLASTY WITH STENT PLACEMENT N/A   . HERNIA REPAIR     umbilical hernia repair  . PORTACATH PLACEMENT Right 02/03/2015   Procedure: INSERTION PORT-A-CATH;  Surgeon: [name], MD;  Location: [facility] ORS;  Service: General;  Laterality: Right;    FAMILY HISTORY :   Family History  Problem Relation Age of Onset  . Atrial fibrillation Sister   . Hypertension Sister     SOCIAL HISTORY:   Social History  Substance Use Topics  . Smoking status: Former Smoker    Packs/day: 1.00  Types: Cigarettes    Quit date: 10/25/2004  . Smokeless tobacco: Never Used     Comment: stopped smoking greater than 7 years ago  . Alcohol use No    ALLERGIES:  is allergic to no known allergies.  MEDICATIONS:  Current Outpatient Prescriptions  Medication Sig Dispense Refill  . acetaminophen (TYLENOL) 500 MG tablet Take 1,000 mg by mouth every 6 (six) hours as needed.    Marland Kitchen apixaban (ELIQUIS) 5 MG TABS tablet Take 1 tablet (5 mg total) by mouth 2 (two) times daily. 60 tablet 3  . clopidogrel (PLAVIX) 75 MG tablet Take 75 mg by mouth daily.    . [facility] CALCIUM 600+D  600-800 MG-UNIT TABS Take 1 tablet by mouth daily. At noon  3  . fexofenadine (ALLEGRA) 180 MG tablet Take 180 mg by mouth daily.     . furosemide (LASIX) 20 MG tablet Take 20 mg by mouth daily.  1  . gabapentin (NEURONTIN) 300 MG capsule Take 1 pill in AM; and 2 pills at PM. 90 capsule 3  . lidocaine-prilocaine (EMLA) cream APPLY TOPICALLY AS NEEDED 30 g 2  . lisinopril (PRINIVIL,ZESTRIL) 20 MG tablet Take 20 mg by mouth daily.    . metoprolol succinate (TOPROL-XL) 50 MG 24 hr tablet Take 50 mg by mouth 2 (two) times daily. Take with or immediately following a meal.    . omega-3 acid ethyl esters (LOVAZA) 1 G capsule Take 1 g by mouth at bedtime.    Marland Kitchen PHOSPHA 250 NEUTRAL 155-852-130 MG tablet TAKE 1 TABLET BY MOUTH TWICE DAILY 60 tablet 4  . potassium chloride (K-DUR) 10 MEQ tablet Take 10 mEq by mouth daily.   1  . simvastatin (ZOCOR) 80 MG tablet Take 80 mg by mouth at bedtime.    Marland Kitchen apixaban (ELIQUIS) 5 MG TABS tablet Take 2 tablets (10 mg total) by mouth 2 (two) times daily. 28 tablet 0  . ondansetron (ZOFRAN) 8 MG tablet TAKE 1 TABLET (8MG) BY MOUTH TWICE DAILY START THE DAY AFTER CHEMOTHERAPY FOR 2 DAYS THEN TAKE AS NEEDED FOR NAUSEA OR VOMITING (Patient not taking: Reported on 03/16/2016) 30 tablet 0   No current facility-administered medications for this visit.    Facility-Administered Medications Ordered in Other Visits  Medication Dose Route Frequency Provider Last Rate Last Dose  . sodium chloride 0.9 % injection 10 mL  10 mL Intracatheter PRN Forest Gleason, MD   10 mL at 07/07/15 0911  . sodium chloride flush (NS) 0.9 % injection 10 mL  10 mL Intravenous PRN Forest Gleason, MD      . sodium chloride flush (NS) 0.9 % injection 10 mL  10 mL Intravenous PRN Cammie Sickle, MD   10 mL at 01/20/16 1320    PHYSICAL EXAMINATION: ECOG PERFORMANCE STATUS: 1 - Symptomatic but completely ambulatory  BP (!) 164/77 (BP Location: Left Arm, Patient Position: Sitting)   Pulse (!) 101   Temp  98.6 F (37 C) (Tympanic)   Resp 20   Ht 5' 5" (1.651 m)   Wt 295 lb 6.4 oz (134 kg)   BMI 49.16 kg/m   Filed Weights   03/16/16 1349  Weight: 295 lb 6.4 oz (134 kg)    GENERAL: Well-nourished well-developed; Alert, no distress and comfortable.  She is alone. . Morbidly obese. EYES: no pallor or icterus OROPHARYNX: no thrush or ulceration;  NECK: supple, no masses felt LYMPH:  no palpable lymphadenopathy in the cervical, axillary or inguinal regions LUNGS: clear to  auscultation and  No wheeze or crackles HEART/CVS: regular rate & rhythm and no murmurs; chronic bilateral lower extremity edema. ABDOMEN:abdomen soft, non-tender and normal bowel sounds Musculoskeletal:no cyanosis of digits and no clubbing; right upper extremity lymphedema noted. PSYCH: alert & oriented x 3 with fluent speech NEURO: no focal motor/sensory deficits SKIN:   Right breast ulcerated/ scabbing lesion noted; no drainage. Right anterior shoulder posterior shoulder lesions [ 5x6 cm in size] noted.  LABORATORY DATA:  I have reviewed the data as listed    Component Value Date/Time   NA 139 03/16/2016 1337   NA 142 08/01/2014 1435   K 4.1 03/16/2016 1337   K 3.5 08/01/2014 1435   CL 107 03/16/2016 1337   CL 103 08/01/2014 1435   CO2 25 03/16/2016 1337   CO2 27 08/01/2014 1435   GLUCOSE 136 (H) 03/16/2016 1337   GLUCOSE 148 (H) 08/01/2014 1435   BUN 13 03/16/2016 1337   BUN 13 08/01/2014 1435   CREATININE 0.66 03/16/2016 1337   CREATININE 0.71 10/02/2014 0930   CALCIUM 8.8 (L) 03/16/2016 1337   CALCIUM 8.6 (L) 10/02/2014 0930   PROT 7.1 03/16/2016 1337   PROT 7.6 10/02/2014 0930   ALBUMIN 3.3 (L) 03/16/2016 1337   ALBUMIN 3.7 10/02/2014 0930   AST 42 (H) 03/16/2016 1337   AST 21 10/02/2014 0930   ALT 43 03/16/2016 1337   ALT 23 10/02/2014 0930   ALKPHOS 44 03/16/2016 1337   ALKPHOS 41 10/02/2014 0930   BILITOT 0.4 03/16/2016 1337   BILITOT 0.5 10/02/2014 0930   GFRNONAA >60 03/16/2016 1337    GFRNONAA >60 10/02/2014 0930   GFRAA >60 03/16/2016 1337   GFRAA >60 10/02/2014 0930    No results found for: SPEP, UPEP  Lab Results  Component Value Date   WBC 8.3 03/16/2016   NEUTROABS 5.7 03/16/2016   HGB 10.7 (L) 03/16/2016   HCT 33.0 (L) 03/16/2016   MCV 84.3 03/16/2016   PLT 534 (H) 03/16/2016      Chemistry      Component Value Date/Time   NA 139 03/16/2016 1337   NA 142 08/01/2014 1435   K 4.1 03/16/2016 1337   K 3.5 08/01/2014 1435   CL 107 03/16/2016 1337   CL 103 08/01/2014 1435   CO2 25 03/16/2016 1337   CO2 27 08/01/2014 1435   BUN 13 03/16/2016 1337   BUN 13 08/01/2014 1435   CREATININE 0.66 03/16/2016 1337   CREATININE 0.71 10/02/2014 0930      Component Value Date/Time   CALCIUM 8.8 (L) 03/16/2016 1337   CALCIUM 8.6 (L) 10/02/2014 0930   ALKPHOS 44 03/16/2016 1337   ALKPHOS 41 10/02/2014 0930   AST 42 (H) 03/16/2016 1337   AST 21 10/02/2014 0930   ALT 43 03/16/2016 1337   ALT 23 10/02/2014 0930   BILITOT 0.4 03/16/2016 1337   BILITOT 0.5 10/02/2014 0930       RADIOGRAPHIC STUDIES: I have personally reviewed the radiological images as listed and agreed with the findings in the report. No results found.    ASSESSMENT & PLAN:  Malignant neoplasm of overlapping sites of right breast [facility]) Patient currently on therapy with gemcitabine after restaging Aug 2017- CAT scan shows progressive disease in the right shoulder/underarm; and also the adrenal lesion.  # No significant progression of the disease noted/neither regression of the disease noted at this time. Continue gemcitabine- tolerating well except for low grade fever/chills/fatigue  # Labs reviewed- Hb10; otherwise stable; proceed  with chemo today.   # PE- Moderate- based on CT imaging incidental. Patient is asymptomatic. Patient is on Eliquis. New script give; discontinue plavix. NO recent cardiac/strokes.   # Lymphedema RIght UE- Secondary to malignancy currently stable. Monitor for  now.   # PN- G-1-2; improved. Recommend 1 in am; 2 in pm; new script given.   # Follow with me in approximately 2 weeks with cycle #2 day 15.    No orders of the defined types were placed in this encounter.  All questions were answered. The patient knows to call the clinic with any problems, questions or concerns.      Cammie Sickle, MD 03/16/2016 4:50 PM  Review of Systems  All other systems reviewed and are negative.          03/16/2016

## 2016-03-16 NOTE — Assessment & Plan Note (Addendum)
Patient currently on therapy with gemcitabine after restaging Aug 2017- CAT scan shows progressive disease in the right shoulder/underarm; and also the adrenal lesion.  # No significant progression of the disease noted/neither regression of the disease noted at this time. Continue gemcitabine- tolerating well except for low grade fever/chills/fatigue  # Labs reviewed- Hb10; otherwise stable; proceed with chemo today.   # PE- Moderate- based on CT imaging incidental. Patient is asymptomatic. Patient is on Eliquis. New script give; discontinue plavix. NO recent cardiac/strokes.   # Lymphedema RIght UE- Secondary to malignancy currently stable. Monitor for now.   # PN- G-1-2; improved. Recommend 1 in am; 2 in pm; new script given.   # Follow with me in approximately 2 weeks with cycle #2 day 15.

## 2016-03-16 NOTE — Progress Notes (Signed)
Had MUGA yesterday.  Had chills after last infusion and thought she maybe had a little fever

## 2016-03-17 ENCOUNTER — Other Ambulatory Visit: Payer: Self-pay

## 2016-03-17 DIAGNOSIS — C50911 Malignant neoplasm of unspecified site of right female breast: Secondary | ICD-10-CM

## 2016-03-17 DIAGNOSIS — C7951 Secondary malignant neoplasm of bone: Principal | ICD-10-CM

## 2016-03-17 LAB — CANCER ANTIGEN 27.29: CA 27.29: 17.3 U/mL (ref 0.0–38.6)

## 2016-03-23 ENCOUNTER — Inpatient Hospital Stay: Payer: BC Managed Care – PPO

## 2016-03-23 VITALS — BP 145/82 | HR 87 | Temp 97.1°F | Resp 20

## 2016-03-23 DIAGNOSIS — C50911 Malignant neoplasm of unspecified site of right female breast: Secondary | ICD-10-CM

## 2016-03-23 DIAGNOSIS — C50811 Malignant neoplasm of overlapping sites of right female breast: Secondary | ICD-10-CM | POA: Diagnosis not present

## 2016-03-23 DIAGNOSIS — C7951 Secondary malignant neoplasm of bone: Principal | ICD-10-CM

## 2016-03-23 LAB — CBC WITH DIFFERENTIAL/PLATELET
BASOS PCT: 2 %
Basophils Absolute: 0 10*3/uL (ref 0–0.1)
EOS PCT: 0 %
Eosinophils Absolute: 0 10*3/uL (ref 0–0.7)
HEMATOCRIT: 31.5 % — AB (ref 35.0–47.0)
Hemoglobin: 10.2 g/dL — ABNORMAL LOW (ref 12.0–16.0)
LYMPHS PCT: 40 %
Lymphs Abs: 1.3 10*3/uL (ref 1.0–3.6)
MCH: 27.3 pg (ref 26.0–34.0)
MCHC: 32.3 g/dL (ref 32.0–36.0)
MCV: 84.6 fL (ref 80.0–100.0)
MONOS PCT: 13 %
Monocytes Absolute: 0.4 10*3/uL (ref 0.2–0.9)
Neutro Abs: 1.5 10*3/uL (ref 1.4–6.5)
Neutrophils Relative %: 45 %
PLATELETS: 463 10*3/uL — AB (ref 150–440)
RBC: 3.72 MIL/uL — ABNORMAL LOW (ref 3.80–5.20)
RDW: 26.5 % — AB (ref 11.5–14.5)
WBC: 3.2 10*3/uL — ABNORMAL LOW (ref 3.6–11.0)

## 2016-03-23 MED ORDER — SODIUM CHLORIDE 0.9 % IV SOLN
Freq: Once | INTRAVENOUS | Status: AC
  Administered 2016-03-23: 14:00:00 via INTRAVENOUS
  Filled 2016-03-23: qty 1000

## 2016-03-23 MED ORDER — HEPARIN SOD (PORK) LOCK FLUSH 100 UNIT/ML IV SOLN
500.0000 [IU] | Freq: Once | INTRAVENOUS | Status: AC | PRN
  Administered 2016-03-23: 500 [IU]
  Filled 2016-03-23: qty 5

## 2016-03-23 MED ORDER — SODIUM CHLORIDE 0.9% FLUSH
10.0000 mL | INTRAVENOUS | Status: DC | PRN
  Administered 2016-03-23: 10 mL
  Filled 2016-03-23: qty 10

## 2016-03-23 MED ORDER — PROCHLORPERAZINE MALEATE 10 MG PO TABS
10.0000 mg | ORAL_TABLET | Freq: Once | ORAL | Status: AC
  Administered 2016-03-23: 10 mg via ORAL
  Filled 2016-03-23: qty 1

## 2016-03-23 MED ORDER — SODIUM CHLORIDE 0.9 % IV SOLN
2000.0000 mg | Freq: Once | INTRAVENOUS | Status: AC
  Administered 2016-03-23: 2000 mg via INTRAVENOUS
  Filled 2016-03-23: qty 52.6

## 2016-03-30 ENCOUNTER — Inpatient Hospital Stay: Payer: BC Managed Care – PPO | Attending: Internal Medicine

## 2016-03-30 ENCOUNTER — Inpatient Hospital Stay (HOSPITAL_BASED_OUTPATIENT_CLINIC_OR_DEPARTMENT_OTHER): Payer: BC Managed Care – PPO | Admitting: Internal Medicine

## 2016-03-30 ENCOUNTER — Encounter: Payer: Self-pay | Admitting: Internal Medicine

## 2016-03-30 ENCOUNTER — Inpatient Hospital Stay: Payer: BC Managed Care – PPO

## 2016-03-30 VITALS — BP 156/87 | HR 103 | Temp 98.0°F | Resp 17 | Ht 65.0 in | Wt 299.6 lb

## 2016-03-30 DIAGNOSIS — Z170 Estrogen receptor positive status [ER+]: Secondary | ICD-10-CM | POA: Diagnosis not present

## 2016-03-30 DIAGNOSIS — I10 Essential (primary) hypertension: Secondary | ICD-10-CM | POA: Diagnosis not present

## 2016-03-30 DIAGNOSIS — Z5111 Encounter for antineoplastic chemotherapy: Secondary | ICD-10-CM | POA: Insufficient documentation

## 2016-03-30 DIAGNOSIS — Z7901 Long term (current) use of anticoagulants: Secondary | ICD-10-CM | POA: Insufficient documentation

## 2016-03-30 DIAGNOSIS — R7989 Other specified abnormal findings of blood chemistry: Secondary | ICD-10-CM

## 2016-03-30 DIAGNOSIS — C7972 Secondary malignant neoplasm of left adrenal gland: Secondary | ICD-10-CM | POA: Diagnosis not present

## 2016-03-30 DIAGNOSIS — G4730 Sleep apnea, unspecified: Secondary | ICD-10-CM | POA: Insufficient documentation

## 2016-03-30 DIAGNOSIS — Z86711 Personal history of pulmonary embolism: Secondary | ICD-10-CM | POA: Diagnosis not present

## 2016-03-30 DIAGNOSIS — I252 Old myocardial infarction: Secondary | ICD-10-CM | POA: Diagnosis not present

## 2016-03-30 DIAGNOSIS — Z79899 Other long term (current) drug therapy: Secondary | ICD-10-CM | POA: Insufficient documentation

## 2016-03-30 DIAGNOSIS — Z9221 Personal history of antineoplastic chemotherapy: Secondary | ICD-10-CM | POA: Diagnosis not present

## 2016-03-30 DIAGNOSIS — Z87891 Personal history of nicotine dependence: Secondary | ICD-10-CM | POA: Insufficient documentation

## 2016-03-30 DIAGNOSIS — C50911 Malignant neoplasm of unspecified site of right female breast: Secondary | ICD-10-CM

## 2016-03-30 DIAGNOSIS — E669 Obesity, unspecified: Secondary | ICD-10-CM | POA: Diagnosis not present

## 2016-03-30 DIAGNOSIS — C50811 Malignant neoplasm of overlapping sites of right female breast: Secondary | ICD-10-CM | POA: Diagnosis present

## 2016-03-30 DIAGNOSIS — C7951 Secondary malignant neoplasm of bone: Secondary | ICD-10-CM | POA: Insufficient documentation

## 2016-03-30 DIAGNOSIS — I890 Lymphedema, not elsewhere classified: Secondary | ICD-10-CM | POA: Insufficient documentation

## 2016-03-30 DIAGNOSIS — C7989 Secondary malignant neoplasm of other specified sites: Secondary | ICD-10-CM | POA: Insufficient documentation

## 2016-03-30 DIAGNOSIS — C801 Malignant (primary) neoplasm, unspecified: Secondary | ICD-10-CM

## 2016-03-30 LAB — CBC WITH DIFFERENTIAL/PLATELET
Basophils Absolute: 0 10*3/uL (ref 0–0.1)
Basophils Relative: 0 %
Eosinophils Absolute: 0 10*3/uL (ref 0–0.7)
Eosinophils Relative: 0 %
HEMATOCRIT: 26.3 % — AB (ref 35.0–47.0)
Hemoglobin: 8.8 g/dL — ABNORMAL LOW (ref 12.0–16.0)
LYMPHS PCT: 10 %
Lymphs Abs: 0.7 10*3/uL — ABNORMAL LOW (ref 1.0–3.6)
MCH: 28.2 pg (ref 26.0–34.0)
MCHC: 33.3 g/dL (ref 32.0–36.0)
MCV: 84.7 fL (ref 80.0–100.0)
MONO ABS: 0.3 10*3/uL (ref 0.2–0.9)
MONOS PCT: 4 %
NEUTROS ABS: 5.9 10*3/uL (ref 1.4–6.5)
Neutrophils Relative %: 86 %
Platelets: 133 10*3/uL — ABNORMAL LOW (ref 150–440)
RBC: 3.1 MIL/uL — ABNORMAL LOW (ref 3.80–5.20)
RDW: 27 % — AB (ref 11.5–14.5)
WBC: 6.9 10*3/uL (ref 3.6–11.0)

## 2016-03-30 LAB — COMPREHENSIVE METABOLIC PANEL
ALBUMIN: 2.7 g/dL — AB (ref 3.5–5.0)
ALK PHOS: 48 U/L (ref 38–126)
ALT: 59 U/L — ABNORMAL HIGH (ref 14–54)
ANION GAP: 12 (ref 5–15)
AST: 100 U/L — AB (ref 15–41)
BILIRUBIN TOTAL: 0.6 mg/dL (ref 0.3–1.2)
BUN: 25 mg/dL — AB (ref 6–20)
CALCIUM: 8.8 mg/dL — AB (ref 8.9–10.3)
CO2: 21 mmol/L — ABNORMAL LOW (ref 22–32)
Chloride: 105 mmol/L (ref 101–111)
Creatinine, Ser: 0.88 mg/dL (ref 0.44–1.00)
GFR calc Af Amer: >60 mL/min (ref >60)
GFR calc non Af Amer: >60 mL/min (ref >60)
GLUCOSE: 128 mg/dL — AB (ref 65–99)
Potassium: 4.1 mmol/L (ref 3.5–5.1)
Sodium: 138 mmol/L (ref 135–145)
TOTAL PROTEIN: 7 g/dL (ref 6.5–8.1)

## 2016-03-30 MED ORDER — HEPARIN SOD (PORK) LOCK FLUSH 100 UNIT/ML IV SOLN
500.0000 [IU] | Freq: Once | INTRAVENOUS | Status: AC
  Administered 2016-03-30: 500 [IU] via INTRAVENOUS

## 2016-03-30 NOTE — Progress Notes (Signed)
[place]  Telephone:(336) [phone]  Fax:(336) [phone]     [name] DOB: 1955/09/07  MR#: [id number]  HOZ#:[id number]  Patient Care Team: Kirk Ruths, MD as PCP - General (Internal Medicine)  CHIEF COMPLAINT:  Chief Complaint  Patient presents with  . Follow-up    Breast [place] OFFICE PROGRESS NOTE  Patient Care Team: Kirk Ruths, MD as PCP - General (Internal Medicine)  Breast cancer metastasized to bone [facility])   Staging form: Breast, AJCC 7th Edition     Clinical stage from 06/27/2013: Stage IV (T4, N2, M1) - Signed by [name], NP on 12/25/2014    Oncology History   1. RIGHT Breast cancer [Neglected] T4 N2 M1 ER positive HER-2 negative, stage IV disease of the right breast with metastases to thoracic spine, right chest wall, adrenal gland. Jan 2015- Letrozole + [name] 2016- CEA-223]  2. July 2016- [facility] study-random Taxol; Progression- CEA ~200s/Imaging  3. April 2017- ERIBULIN; AUG 14th 2017- PROGRESSION right shouler girdle/ Left adernal mass  # Feb 17 2016- GEM  # LYMPHEDEMA of RIGHT UE /Breast ulcerated lesion; AUG 2017 - Indidental PE- Eliquis     Breast cancer metastasized to bone ([place])   06/27/2013 Initial Diagnosis    Breast cancer metastasized to bone       Malignant neoplasm of overlapping sites of right breast ([place])   01/20/2016 Initial Diagnosis    Malignant neoplasm of overlapping sites of right breast ([place])      INTERVAL HISTORY:  A pleasant 60 year old morbidly obese Caucasian female patient with above history of metastatic breast cancer is Gemcitabine currently status post cycle #2 day 8; is here for day 15 treatment.  Patient complains of feeling poorly overall. She complains of chills/extreme fatigue for the last few days especially after the chemotherapy. No fevers. No skin rash.  Patient denies any unusual shortness of breath or cough. Denies any pain.  Tingling and numbness in extremities- on Neurontin. Patient denies any nausea vomiting.  Her appetite is good.   REVIEW OF SYSTEMS:  A complete 10 point review of system is done which is negative except mentioned above/history of present illness.   PAST MEDICAL HISTORY :  Past Medical History:  Diagnosis Date  . Anemia   . Anginal pain ([place])   . Breast cancer ([place])    2014   . Breast cancer metastasized to bone ([place]) 11/21/2014  . Hypertension   . Last menstrual period (LMP) > 10 days ago 2007  . Myocardial infarction   . Post-menopausal 2006  . Sleep apnea   . Wound drainage     PAST SURGICAL HISTORY :   Past Surgical History:  Procedure Laterality Date  . CORONARY ANGIOPLASTY WITH STENT PLACEMENT N/A   . HERNIA REPAIR     umbilical hernia repair  . PORTACATH PLACEMENT Right 02/03/2015   Procedure: INSERTION PORT-A-CATH;  Surgeon: [name], MD;  Location: [facility] ORS;  Service: General;  Laterality: Right;    FAMILY HISTORY :   Family History  Problem Relation Age of Onset  . Atrial fibrillation Sister   . Hypertension Sister     SOCIAL HISTORY:   Social History  Substance Use Topics  . Smoking status: Former Smoker    Packs/day: 1.00    Types: Cigarettes    Quit date: 10/25/2004  . Smokeless tobacco: Never Used     Comment: stopped smoking greater than 7 years  ago  . Alcohol use No    ALLERGIES:  is allergic to no known allergies.  MEDICATIONS:  Current Outpatient Prescriptions  Medication Sig Dispense Refill  . acetaminophen (TYLENOL) 500 MG tablet Take 1,000 mg by mouth every 6 (six) hours as needed.    Marland Kitchen apixaban (ELIQUIS) 5 MG TABS tablet Take 1 tablet (5 mg total) by mouth 2 (two) times daily. 60 tablet 3  . clopidogrel (PLAVIX) 75 MG tablet Take 75 mg by mouth daily.    . [facility] CALCIUM 600+D 600-800 MG-UNIT TABS Take 1 tablet by mouth daily. At noon  3  . fexofenadine (ALLEGRA) 180 MG tablet Take 180 mg by mouth daily.     . furosemide (LASIX) 20 MG  tablet Take 20 mg by mouth daily.  1  . gabapentin (NEURONTIN) 300 MG capsule Take 1 pill in AM; and 2 pills at PM. 90 capsule 3  . lidocaine-prilocaine (EMLA) cream APPLY TOPICALLY AS NEEDED 30 g 2  . lisinopril (PRINIVIL,ZESTRIL) 20 MG tablet Take 20 mg by mouth daily.    . metoprolol succinate (TOPROL-XL) 50 MG 24 hr tablet Take 50 mg by mouth 2 (two) times daily. Take with or immediately following a meal.    . omega-3 acid ethyl esters (LOVAZA) 1 G capsule Take 1 g by mouth at bedtime.    Marland Kitchen PHOSPHA 250 NEUTRAL 155-852-130 MG tablet TAKE 1 TABLET BY MOUTH TWICE DAILY 60 tablet 4  . potassium chloride (K-DUR) 10 MEQ tablet Take 10 mEq by mouth daily.   1  . simvastatin (ZOCOR) 80 MG tablet Take 80 mg by mouth at bedtime.    Marland Kitchen apixaban (ELIQUIS) 5 MG TABS tablet Take 2 tablets (10 mg total) by mouth 2 (two) times daily. 28 tablet 0  . ondansetron (ZOFRAN) 8 MG tablet TAKE 1 TABLET (8MG) BY MOUTH TWICE DAILY START THE DAY AFTER CHEMOTHERAPY FOR 2 DAYS THEN TAKE AS NEEDED FOR NAUSEA OR VOMITING (Patient not taking: Reported on 03/30/2016) 30 tablet 0   No current facility-administered medications for this visit.    Facility-Administered Medications Ordered in Other Visits  Medication Dose Route Frequency Provider Last Rate Last Dose  . sodium chloride 0.9 % injection 10 mL  10 mL Intracatheter PRN Forest Gleason, MD   10 mL at 07/07/15 0911  . sodium chloride flush (NS) 0.9 % injection 10 mL  10 mL Intravenous PRN Forest Gleason, MD      . sodium chloride flush (NS) 0.9 % injection 10 mL  10 mL Intravenous PRN Cammie Sickle, MD   10 mL at 01/20/16 1320    PHYSICAL EXAMINATION: ECOG PERFORMANCE STATUS: 1 - Symptomatic but completely ambulatory  BP (!) 156/87 (BP Location: Left Arm, Patient Position: Sitting)   Pulse (!) 103   Temp 98 F (36.7 C) (Tympanic)   Resp 17   Ht 5' 5"  (1.651 m)   Wt 299 lb 9.6 oz (135.9 kg)   BMI 49.86 kg/m   Filed Weights   03/30/16 1334  Weight: 299 lb  9.6 oz (135.9 kg)    GENERAL: Well-nourished well-developed; Alert, no distress and comfortable.  She is Accompanied by her husband. She is a wheelchair.. Morbidly obese. EYES: no pallor or icterus OROPHARYNX: no thrush or ulceration;  NECK: supple, no masses felt LYMPH:  no palpable lymphadenopathy in the cervical, axillary or inguinal regions LUNGS: clear to auscultation and  No wheeze or crackles HEART/CVS: regular rate & rhythm and no murmurs; chronic bilateral lower extremity  edema. ABDOMEN:abdomen soft, non-tender and normal bowel sounds Musculoskeletal:no cyanosis of digits and no clubbing; right upper extremity lymphedema noted. PSYCH: alert & oriented x 3 with fluent speech NEURO: no focal motor/sensory deficits SKIN:   Right breast ulcerated/ scabbing lesion noted; no drainage. Right anterior [4cm] shoulder;la shoulder lesions [ 5x6 cm in size] noted.  LABORATORY DATA:  I have reviewed the data as listed    Component Value Date/Time   NA 138 03/30/2016 1315   NA 142 08/01/2014 1435   K 4.1 03/30/2016 1315   K 3.5 08/01/2014 1435   CL 105 03/30/2016 1315   CL 103 08/01/2014 1435   CO2 21 (L) 03/30/2016 1315   CO2 27 08/01/2014 1435   GLUCOSE 128 (H) 03/30/2016 1315   GLUCOSE 148 (H) 08/01/2014 1435   BUN 25 (H) 03/30/2016 1315   BUN 13 08/01/2014 1435   CREATININE 0.88 03/30/2016 1315   CREATININE 0.71 10/02/2014 0930   CALCIUM 8.8 (L) 03/30/2016 1315   CALCIUM 8.6 (L) 10/02/2014 0930   PROT 7.0 03/30/2016 1315   PROT 7.6 10/02/2014 0930   ALBUMIN 2.7 (L) 03/30/2016 1315   ALBUMIN 3.7 10/02/2014 0930   AST 100 (H) 03/30/2016 1315   AST 21 10/02/2014 0930   ALT 59 (H) 03/30/2016 1315   ALT 23 10/02/2014 0930   ALKPHOS 48 03/30/2016 1315   ALKPHOS 41 10/02/2014 0930   BILITOT 0.6 03/30/2016 1315   BILITOT 0.5 10/02/2014 0930   GFRNONAA >60 03/30/2016 1315   GFRNONAA >60 10/02/2014 0930   GFRAA >60 03/30/2016 1315   GFRAA >60 10/02/2014 0930    No results  found for: SPEP, UPEP  Lab Results  Component Value Date   WBC 6.9 03/30/2016   NEUTROABS 5.9 03/30/2016   HGB 8.8 (L) 03/30/2016   HCT 26.3 (L) 03/30/2016   MCV 84.7 03/30/2016   PLT 133 (L) 03/30/2016      Chemistry      Component Value Date/Time   NA 138 03/30/2016 1315   NA 142 08/01/2014 1435   K 4.1 03/30/2016 1315   K 3.5 08/01/2014 1435   CL 105 03/30/2016 1315   CL 103 08/01/2014 1435   CO2 21 (L) 03/30/2016 1315   CO2 27 08/01/2014 1435   BUN 25 (H) 03/30/2016 1315   BUN 13 08/01/2014 1435   CREATININE 0.88 03/30/2016 1315   CREATININE 0.71 10/02/2014 0930      Component Value Date/Time   CALCIUM 8.8 (L) 03/30/2016 1315   CALCIUM 8.6 (L) 10/02/2014 0930   ALKPHOS 48 03/30/2016 1315   ALKPHOS 41 10/02/2014 0930   AST 100 (H) 03/30/2016 1315   AST 21 10/02/2014 0930   ALT 59 (H) 03/30/2016 1315   ALT 23 10/02/2014 0930   BILITOT 0.6 03/30/2016 1315   BILITOT 0.5 10/02/2014 0930       RADIOGRAPHIC STUDIES: I have personally reviewed the radiological images as listed and agreed with the findings in the report. No results found.    ASSESSMENT & PLAN:  Malignant neoplasm of overlapping sites of right breast [facility]) Metastatic breast cancer- Aug 2017- CAT scan shows progressive disease in the right shoulder/underarm; and also the adrenal lesion. Currently on gemcitabine s/p 2 cycles day 8. Concerns for clinical progression of disease at this time  # HOLD chemotherapy today as patient is feeling poorly. Get CT of the chest ASAP.   # LFTs- AST ALT mildly elevated unclear etiology. Question gemcitabine.  # Hemoglobin 8.8; from chemotherapy. Monitor  for now.  # PE- Moderate- based on CT imaging incidental. Patient is asymptomatic. Patient is on Eliquis.   # Lymphedema RIght UE- Secondary to malignancy currently stable. Monitor for now.   # Follow-up with me few days after the CT scan/labs.  Orders Placed This Encounter  Procedures  . CT CHEST W CONTRAST      Standing Status:   Future    Standing Expiration Date:   05/30/2017    Order Specific Question:   Reason for Exam (SYMPTOM  OR DIAGNOSIS REQUIRED)    Answer:   breast cancer    Order Specific Question:   Is the patient pregnant?    Answer:   No    Order Specific Question:   Preferred imaging location?    Answer:   [facility]  . CT ABDOMEN PELVIS W CONTRAST    Standing Status:   Future    Standing Expiration Date:   06/29/2017    Order Specific Question:   Reason for Exam (SYMPTOM  OR DIAGNOSIS REQUIRED)    Answer:   breast cancer    Order Specific Question:   Is the patient pregnant?    Answer:   No    Order Specific Question:   Preferred imaging location?    Answer[facility]  . CBC with Differential    Standing Status:   Future    Standing Expiration Date:   03/30/2017  . Comprehensive metabolic panel    Standing Status:   Future    Standing Expiration Date:   03/30/2017   All questions were answered. The patient knows to call the clinic with any problems, questions or concerns.      Cammie Sickle, MD 03/30/2016 5:25 PM  Review of Systems  All other systems reviewed and are negative.          03/30/2016

## 2016-03-30 NOTE — Progress Notes (Signed)
More fatigue and decreased appetite.

## 2016-03-30 NOTE — Assessment & Plan Note (Addendum)
Metastatic breast cancer- Aug 2017- CAT scan shows progressive disease in the right shoulder/underarm; and also the adrenal lesion. Currently on gemcitabine s/p 2 cycles day 8. Concerns for clinical progression of disease at this time  # HOLD chemotherapy today as patient is feeling poorly. Get CT of the chest ASAP.   # LFTs- AST ALT mildly elevated unclear etiology. Question gemcitabine.  # Hemoglobin 8.8; from chemotherapy. Monitor for now.  # PE- Moderate- based on CT imaging incidental. Patient is asymptomatic. Patient is on Eliquis.   # Lymphedema RIght UE- Secondary to malignancy currently stable. Monitor for now.   # Follow-up with me few days after the CT scan/labs.

## 2016-04-06 DIAGNOSIS — C50911 Malignant neoplasm of unspecified site of right female breast: Secondary | ICD-10-CM

## 2016-04-06 DIAGNOSIS — C7951 Secondary malignant neoplasm of bone: Principal | ICD-10-CM

## 2016-04-06 NOTE — Progress Notes (Signed)
Telephone call to patient today for follow up off [place] [id number] I protocol. The patient states that she is doing much better than last week at which time her treatment was withheld due to symptoms of chills, generalized weakness, unable to eat or drink, increased LFT's and just feeling very bad. She stated that she needed a wheelchair when she came for her visit. The patient reports all of her symptoms have resolved and that she feels much better. She states she is eating 3 meals per day, drinking fluids and does not feel as weak as she did last week. She will return on Monday April 11, 2016 to re-evaluate with Dr. Rogue Bussing. The patient agreed to allow this nurse to report her status to Dr. Rogue Bussing today, email was sent. All follow up completed. Next follow up will be June 29, 2016. Mirian Mo, RN, BSN 04/06/2016 12:37 PM

## 2016-04-08 ENCOUNTER — Ambulatory Visit
Admission: RE | Admit: 2016-04-08 | Discharge: 2016-04-08 | Disposition: A | Discharge status: Home / Self Care | Payer: BC Managed Care – PPO | Source: Ambulatory Visit | Attending: Internal Medicine | Admitting: Internal Medicine

## 2016-04-08 DIAGNOSIS — C50811 Malignant neoplasm of overlapping sites of right female breast: Secondary | ICD-10-CM | POA: Diagnosis not present

## 2016-04-08 DIAGNOSIS — M899 Disorder of bone, unspecified: Secondary | ICD-10-CM | POA: Insufficient documentation

## 2016-04-08 DIAGNOSIS — I2510 Atherosclerotic heart disease of native coronary artery without angina pectoris: Secondary | ICD-10-CM | POA: Insufficient documentation

## 2016-04-08 DIAGNOSIS — Z86711 Personal history of pulmonary embolism: Secondary | ICD-10-CM | POA: Diagnosis not present

## 2016-04-08 DIAGNOSIS — I517 Cardiomegaly: Secondary | ICD-10-CM | POA: Diagnosis not present

## 2016-04-08 DIAGNOSIS — C7972 Secondary malignant neoplasm of left adrenal gland: Secondary | ICD-10-CM | POA: Diagnosis not present

## 2016-04-08 DIAGNOSIS — I2699 Other pulmonary embolism without acute cor pulmonale: Secondary | ICD-10-CM | POA: Diagnosis not present

## 2016-04-08 MED ORDER — IOPAMIDOL (ISOVUE-300) INJECTION 61%
100.0000 mL | Freq: Once | INTRAVENOUS | Status: AC | PRN
  Administered 2016-04-08: 100 mL via INTRAVENOUS

## 2016-04-11 ENCOUNTER — Other Ambulatory Visit: Payer: Self-pay

## 2016-04-11 ENCOUNTER — Inpatient Hospital Stay: Payer: BC Managed Care – PPO

## 2016-04-11 ENCOUNTER — Encounter: Payer: Self-pay | Admitting: Internal Medicine

## 2016-04-11 ENCOUNTER — Inpatient Hospital Stay (HOSPITAL_BASED_OUTPATIENT_CLINIC_OR_DEPARTMENT_OTHER): Payer: BC Managed Care – PPO | Admitting: Internal Medicine

## 2016-04-11 VITALS — BP 157/85 | HR 105 | Temp 97.6°F | Resp 18 | Ht 65.0 in | Wt 304.8 lb

## 2016-04-11 DIAGNOSIS — G4730 Sleep apnea, unspecified: Secondary | ICD-10-CM

## 2016-04-11 DIAGNOSIS — Z7901 Long term (current) use of anticoagulants: Secondary | ICD-10-CM

## 2016-04-11 DIAGNOSIS — Z79899 Other long term (current) drug therapy: Secondary | ICD-10-CM

## 2016-04-11 DIAGNOSIS — C50811 Malignant neoplasm of overlapping sites of right female breast: Secondary | ICD-10-CM | POA: Diagnosis not present

## 2016-04-11 DIAGNOSIS — C7972 Secondary malignant neoplasm of left adrenal gland: Secondary | ICD-10-CM | POA: Diagnosis not present

## 2016-04-11 DIAGNOSIS — Z170 Estrogen receptor positive status [ER+]: Secondary | ICD-10-CM | POA: Insufficient documentation

## 2016-04-11 DIAGNOSIS — I252 Old myocardial infarction: Secondary | ICD-10-CM

## 2016-04-11 DIAGNOSIS — Z9221 Personal history of antineoplastic chemotherapy: Secondary | ICD-10-CM

## 2016-04-11 DIAGNOSIS — I890 Lymphedema, not elsewhere classified: Secondary | ICD-10-CM

## 2016-04-11 DIAGNOSIS — C7989 Secondary malignant neoplasm of other specified sites: Secondary | ICD-10-CM

## 2016-04-11 DIAGNOSIS — R7989 Other specified abnormal findings of blood chemistry: Secondary | ICD-10-CM

## 2016-04-11 DIAGNOSIS — E669 Obesity, unspecified: Secondary | ICD-10-CM

## 2016-04-11 DIAGNOSIS — Z86711 Personal history of pulmonary embolism: Secondary | ICD-10-CM

## 2016-04-11 DIAGNOSIS — I10 Essential (primary) hypertension: Secondary | ICD-10-CM

## 2016-04-11 DIAGNOSIS — Z87891 Personal history of nicotine dependence: Secondary | ICD-10-CM

## 2016-04-11 LAB — COMPREHENSIVE METABOLIC PANEL
ALT: 21 U/L (ref 14–54)
AST: 32 U/L (ref 15–41)
Albumin: 3.2 g/dL — ABNORMAL LOW (ref 3.5–5.0)
Alkaline Phosphatase: 48 U/L (ref 38–126)
Anion gap: 6 (ref 5–15)
BUN: 13 mg/dL (ref 6–20)
CHLORIDE: 109 mmol/L (ref 101–111)
CO2: 26 mmol/L (ref 22–32)
Calcium: 9 mg/dL (ref 8.9–10.3)
Creatinine, Ser: 1 mg/dL (ref 0.44–1.00)
Glucose, Bld: 141 mg/dL — ABNORMAL HIGH (ref 65–99)
POTASSIUM: 4.4 mmol/L (ref 3.5–5.1)
Sodium: 141 mmol/L (ref 135–145)
Total Bilirubin: 0.3 mg/dL (ref 0.3–1.2)
Total Protein: 7 g/dL (ref 6.5–8.1)

## 2016-04-11 LAB — CBC WITH DIFFERENTIAL/PLATELET
BASOS ABS: 0.1 10*3/uL (ref 0–0.1)
Basophils Relative: 1 %
EOS PCT: 3 %
Eosinophils Absolute: 0.2 10*3/uL (ref 0–0.7)
HCT: 35 % (ref 35.0–47.0)
Hemoglobin: 11.2 g/dL — ABNORMAL LOW (ref 12.0–16.0)
LYMPHS ABS: 1 10*3/uL (ref 1.0–3.6)
LYMPHS PCT: 14 %
MCH: 29.3 pg (ref 26.0–34.0)
MCHC: 32 g/dL (ref 32.0–36.0)
MCV: 91.3 fL (ref 80.0–100.0)
MONO ABS: 0.8 10*3/uL (ref 0.2–0.9)
Monocytes Relative: 11 %
Neutro Abs: 5.2 10*3/uL (ref 1.4–6.5)
Neutrophils Relative %: 71 %
PLATELETS: 385 10*3/uL (ref 150–440)
RBC: 3.83 MIL/uL (ref 3.80–5.20)
RDW: 28.6 % — AB (ref 11.5–14.5)
WBC: 7.2 10*3/uL (ref 3.6–11.0)

## 2016-04-11 NOTE — Progress Notes (Signed)
[place]  [phone]  [phone]     [name] DOB: 01-01-1956  MR#: [id number]  YKZ#:[id number]  Patient Care Team: Kirk Ruths, MD as PCP - General (Internal Medicine)  CHIEF COMPLAINT:  Chief Complaint  Patient presents with  . Follow-up    [facility] OFFICE PROGRESS NOTE  Patient Care Team: Kirk Ruths, MD as PCP - General (Internal Medicine)  Breast cancer metastasized to bone [facility])   Staging form: Breast, AJCC 7th Edition     Clinical stage from 06/27/2013: Stage IV (T4, N2, M1) - Signed by [name], NP on 12/25/2014    Oncology History   1. RIGHT Breast cancer [Neglected] T4 N2 M1 ER positive HER-2 negative, stage IV disease of the right breast with metastases to thoracic spine, right chest wall, adrenal gland. Jan 2015- Letrozole + [name] 2016- CEA-223]  2. July 2016- [facility] study-random Taxol; Progression- CEA ~200s/Imaging  3. April 2017- ERIBULIN; AUG 14th 2017- PROGRESSION right shouler girdle/ Left adernal mass  # Feb 17 2016- GEM x2 cycles [poor tol-fevers/ Increased LFTs]; OCT 2017- CT- stable/ improved left adrenal mass  # OCT 25th 2017- DOXIL q 4w  # LYMPHEDEMA of RIGHT UE /Breast ulcerated lesion; AUG 2017 - [facility] PE- Eliquis     Breast cancer metastasized to bone ([place])   06/27/2013 Initial Diagnosis    Breast cancer metastasized to bone       Malignant neoplasm of overlapping sites of right breast ([place])   01/20/2016 Initial Diagnosis    Malignant neoplasm of overlapping sites of right breast ([place])      INTERVAL HISTORY:  A pleasant 60 year old morbidly obese Caucasian female patient with above history of metastatic breast cancer is Gemcitabine currently status post cycle #2 day 8- Day 15 was held because of poor tolerance./Patient is here to review the results of the CT scan.  Since holding the chemotherapy approximately  2 weeks-  patient feels much better. Fevers and chills improved. Appetite is improving.   Patient denies any unusual shortness of breath or cough. Denies any pain. Tingling and numbness in extremities- on Neurontin. Patient denies any nausea vomiting.   REVIEW OF SYSTEMS:  A complete 10 point review of system is done which is negative except mentioned above/history of present illness.   PAST MEDICAL HISTORY :  Past Medical History:  Diagnosis Date  . Anemia   . Anginal pain ([place])   . Breast cancer ([place])    2014   . Breast cancer metastasized to bone ([place]) 11/21/2014  . Hypertension   . Last menstrual period (LMP) > 10 days ago 2007  . Myocardial infarction   . Post-menopausal 2006  . Sleep apnea   . Wound drainage     PAST SURGICAL HISTORY :   Past Surgical History:  Procedure Laterality Date  . CORONARY ANGIOPLASTY WITH STENT PLACEMENT N/A   . HERNIA REPAIR     umbilical hernia repair  . PORTACATH PLACEMENT Right 02/03/2015   Procedure: INSERTION PORT-A-CATH;  Surgeon: [name], MD;  Location: [facility] ORS;  Service: General;  Laterality: Right;    FAMILY HISTORY :   Family History  Problem Relation Age of Onset  . Atrial fibrillation Sister   . Hypertension Sister     SOCIAL HISTORY:   Social History  Substance Use Topics  . Smoking status: Former Smoker    Packs/day: 1.00    Types: Cigarettes  Quit date: 10/25/2004  . Smokeless tobacco: Never Used     Comment: stopped smoking greater than 7 years ago  . Alcohol use No    ALLERGIES:  is allergic to no known allergies.  MEDICATIONS:  Current Outpatient Prescriptions  Medication Sig Dispense Refill  . acetaminophen (TYLENOL) 500 MG tablet Take 1,000 mg by mouth every 6 (six) hours as needed.    Marland Kitchen apixaban (ELIQUIS) 5 MG TABS tablet Take 1 tablet (5 mg total) by mouth 2 (two) times daily. 60 tablet 3  . clopidogrel (PLAVIX) 75 MG tablet Take 75 mg by mouth daily.    . [employer] CALCIUM 600+D 600-800 MG-UNIT TABS Take 1  tablet by mouth daily. At noon  3  . fexofenadine (ALLEGRA) 180 MG tablet Take 180 mg by mouth daily.     . furosemide (LASIX) 20 MG tablet Take 20 mg by mouth daily.  1  . gabapentin (NEURONTIN) 300 MG capsule Take 1 pill in AM; and 2 pills at PM. 90 capsule 3  . lidocaine-prilocaine (EMLA) cream APPLY TOPICALLY AS NEEDED 30 g 2  . lisinopril (PRINIVIL,ZESTRIL) 20 MG tablet Take 20 mg by mouth daily.    . metoprolol succinate (TOPROL-XL) 50 MG 24 hr tablet Take 50 mg by mouth 2 (two) times daily. Take with or immediately following a meal.    . omega-3 acid ethyl esters (LOVAZA) 1 G capsule Take 1 g by mouth at bedtime.    . ondansetron (ZOFRAN) 8 MG tablet TAKE 1 TABLET (8MG) BY MOUTH TWICE DAILY START THE DAY AFTER CHEMOTHERAPY FOR 2 DAYS THEN TAKE AS NEEDED FOR NAUSEA OR VOMITING 30 tablet 0  . PHOSPHA 250 NEUTRAL 155-852-130 MG tablet TAKE 1 TABLET BY MOUTH TWICE DAILY 60 tablet 4  . potassium chloride (K-DUR) 10 MEQ tablet Take 10 mEq by mouth daily.   1  . simvastatin (ZOCOR) 80 MG tablet Take 80 mg by mouth at bedtime.    Marland Kitchen apixaban (ELIQUIS) 5 MG TABS tablet Take 2 tablets (10 mg total) by mouth 2 (two) times daily. 28 tablet 0   No current facility-administered medications for this visit.    Facility-Administered Medications Ordered in Other Visits  Medication Dose Route Frequency Provider Last Rate Last Dose  . sodium chloride 0.9 % injection 10 mL  10 mL Intracatheter PRN Forest Gleason, MD   10 mL at 07/07/15 0911  . sodium chloride flush (NS) 0.9 % injection 10 mL  10 mL Intravenous PRN Forest Gleason, MD      . sodium chloride flush (NS) 0.9 % injection 10 mL  10 mL Intravenous PRN Cammie Sickle, MD   10 mL at 01/20/16 1320    PHYSICAL EXAMINATION: ECOG PERFORMANCE STATUS: 1 - Symptomatic but completely ambulatory  BP (!) 157/85 (BP Location: Left Arm, Patient Position: Sitting)   Pulse (!) 105   Temp 97.6 F (36.4 C) (Tympanic)   Resp 18   Ht _0  (1.651 m)   Wt (!)  304 lb 12.8 oz (138.3 kg)   BMI 50.72 kg/m   Filed Weights   04/11/16 1424  Weight: (!) 304 lb 12.8 oz (138.3 kg)    GENERAL: Well-nourished well-developed; Alert, no distress and comfortable.  She is Accompanied by her husband. She is a wheelchair.. Morbidly obese. EYES: no pallor or icterus OROPHARYNX: no thrush or ulceration;  NECK: supple, no masses felt LYMPH:  no palpable lymphadenopathy in the cervical, axillary or inguinal regions LUNGS: clear to auscultation and  No wheeze or crackles HEART/CVS: regular rate & rhythm and no murmurs; chronic bilateral lower extremity edema. ABDOMEN:abdomen soft, non-tender and normal bowel sounds Musculoskeletal:no cyanosis of digits and no clubbing; right upper extremity lymphedema noted. PSYCH: alert & oriented x 3 with fluent speech NEURO: no focal motor/sensory deficits SKIN:   Right breast ulcerated/ scabbing lesion noted; no drainage. Right anterior [4cm] shoulder;la shoulder lesions [ 5x6 cm in size] noted.  LABORATORY DATA:  I have reviewed the data as listed    Component Value Date/Time   NA 141 04/11/2016 1403   NA 142 08/01/2014 1435   K 4.4 04/11/2016 1403   K 3.5 08/01/2014 1435   CL 109 04/11/2016 1403   CL 103 08/01/2014 1435   CO2 26 04/11/2016 1403   CO2 27 08/01/2014 1435   GLUCOSE 141 (H) 04/11/2016 1403   GLUCOSE 148 (H) 08/01/2014 1435   BUN 13 04/11/2016 1403   BUN 13 08/01/2014 1435   CREATININE 1.00 04/11/2016 1403   CREATININE 0.71 10/02/2014 0930   CALCIUM 9.0 04/11/2016 1403   CALCIUM 8.6 (L) 10/02/2014 0930   PROT 7.0 04/11/2016 1403   PROT 7.6 10/02/2014 0930   ALBUMIN 3.2 (L) 04/11/2016 1403   ALBUMIN 3.7 10/02/2014 0930   AST 32 04/11/2016 1403   AST 21 10/02/2014 0930   ALT 21 04/11/2016 1403   ALT 23 10/02/2014 0930   ALKPHOS 48 04/11/2016 1403   ALKPHOS 41 10/02/2014 0930   BILITOT 0.3 04/11/2016 1403   BILITOT 0.5 10/02/2014 0930   GFRNONAA >60 04/11/2016 1403   GFRNONAA >60 10/02/2014  0930   GFRAA >60 04/11/2016 1403   GFRAA >60 10/02/2014 0930    No results found for: SPEP, UPEP  Lab Results  Component Value Date   WBC 7.2 04/11/2016   NEUTROABS 5.2 04/11/2016   HGB 11.2 (L) 04/11/2016   HCT 35.0 04/11/2016   MCV 91.3 04/11/2016   PLT 385 04/11/2016      Chemistry      Component Value Date/Time   NA 141 04/11/2016 1403   NA 142 08/01/2014 1435   K 4.4 04/11/2016 1403   K 3.5 08/01/2014 1435   CL 109 04/11/2016 1403   CL 103 08/01/2014 1435   CO2 26 04/11/2016 1403   CO2 27 08/01/2014 1435   BUN 13 04/11/2016 1403   BUN 13 08/01/2014 1435   CREATININE 1.00 04/11/2016 1403   CREATININE 0.71 10/02/2014 0930      Component Value Date/Time   CALCIUM 9.0 04/11/2016 1403   CALCIUM 8.6 (L) 10/02/2014 0930   ALKPHOS 48 04/11/2016 1403   ALKPHOS 41 10/02/2014 0930   AST 32 04/11/2016 1403   AST 21 10/02/2014 0930   ALT 21 04/11/2016 1403   ALT 23 10/02/2014 0930   BILITOT 0.3 04/11/2016 1403   BILITOT 0.5 10/02/2014 0930      IMPRESSION: 1. In general, the metastatic lesions shown on the prior exam are stable with the exception of the left adrenal metastatic lesion which is reduced in size. 2. 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 3. Extensive atherosclerosis and mild cardiomegaly. 4. There is some vague indistinctness of the anterior cortical margin of the left iliac bone. There could be some tumor eroding this bone all, along the left iliacus muscle, but I am not certain. There is enough artifact that this is questionable. There is also questionable lesions in the left upper  sacrum, along with a small amount of presacral edema.  RADIOGRAPHIC STUDIES: I have personally reviewed the radiological images as listed and agreed with the findings in the report. No results found.    ASSESSMENT & PLAN:  Malignant neoplasm of overlapping sites of right breast  [facility]) Metastatic breast cancer- Aug 2017- CAT scan shows progressive disease in the right shoulder/underarm; and also the adrenal lesion. Currently on gemcitabine s/p 2 cycles day 8.  CT shows sable/improved left adrenal mass. Poor tolerance to gemcitabine; recommend- Doxil.  # Start Doxil chemo next week. [MUGA scan EF -60%]  # LFTs- AST ALT mildly elevated unclear etiology.likely from [facility]; improved/Normal,  # PE- Moderate- based on CT imaging incidental. Patient is asymptomatic. Patient is on Eliquis.   # Lymphedema RIght UE- Secondary to malignancy currently stable. Monitor for now.   # follow up with me in 5 weeks/with second cycle of chemo.   Orders Placed This Encounter  Procedures  . Comprehensive metabolic panel    Standing Status:   Future    Standing Expiration Date:   03/27/2017  . CBC with Differential    Standing Status:   Future    Standing Expiration Date:   04/22/2017   All questions were answered. The patient knows to call the clinic with any problems, questions or concerns.      Cammie Sickle, MD 04/11/2016 4:23 PM  Review of Systems  All other systems reviewed and are negative.          04/11/2016

## 2016-04-11 NOTE — Progress Notes (Signed)
CT scan last Friday  

## 2016-04-11 NOTE — Progress Notes (Signed)
DISCONTINUE ON PATHWAY REGIMEN - Breast  [id number]: Gemcitabine 1,000 mg/m2 d1, d8, d15 q28 Days Until Progression or Unacceptable Toxicity   A cycle is every 28 days (3 weeks on and 1 week off):     Gemcitabine (Gemzar(R)) 1,000 mg/m2 in 250 mL NS IV over 30 minutes days 1, 8 and 15 Dose Mod: None  **Always confirm dose/schedule in your pharmacy ordering system**    REASON: Toxicities / Adverse Event PRIOR TREATMENT: [id number]: Gemcitabine 1,000 mg/m2 d1, d8, d15 q28 Days Until Progression or Unacceptable Toxicity TREATMENT RESPONSE: Stable Disease (SD)  START OFF PATHWAY REGIMEN - Breast  Off Pathway: Liposomal Doxorubicin (Doxil)  40 mg/m2 q28 Days  [id number]:Liposomal Doxorubicin (Doxil)  40 mg/m2 q28 Days:   A cycle is every 28 days:     Liposomal Doxorubicin (Doxil(R)) 40 mg/m2 in 250 mL D5W IV over 60 minutes, initial infusion rate should not exceed 1 mg/min Dose Mod: None  **Always confirm dose/schedule in your pharmacy ordering system**    Patient Characteristics: Metastatic Chemotherapy, HER2/neu Negative/Unknown/Equivocal, ER +, Fourth Line and Beyond, Prior Eribulin AJCC Stage Grouping: IV Current Disease Status: Distant Metastases AJCC M Stage: X ER Status: Positive (+) AJCC N Stage: X AJCC T Stage: X HER2/neu: Negative (-) PR Status: Positive (+) Line of therapy: Fourth Line and Beyond Would you be surprised if this patient died  in the next year? I would be surprised if this patient died in the next year  Intent of Therapy: Non-Curative / Palliative Intent, Discussed with Patient

## 2016-04-11 NOTE — Assessment & Plan Note (Addendum)
Metastatic breast cancer- Aug 2017- CAT scan shows progressive disease in the right shoulder/underarm; and also the adrenal lesion. Currently on gemcitabine s/p 2 cycles day 8.  CT shows sable/improved left adrenal mass. Poor tolerance to gemcitabine; recommend- Doxil.  # Start Doxil chemo next week. [MUGA scan EF -60%]  # LFTs- AST ALT mildly elevated unclear etiology.likely from [facility]; improved/Normal,  # PE- Moderate- based on CT imaging incidental. Patient is asymptomatic. Patient is on Eliquis.   # Lymphedema RIght UE- Secondary to malignancy currently stable. Monitor for now.   # follow up with me in 5 weeks/with second cycle of chemo.

## 2016-04-20 ENCOUNTER — Inpatient Hospital Stay: Payer: BC Managed Care – PPO

## 2016-04-20 VITALS — BP 120/78 | HR 76 | Temp 98.2°F | Resp 18

## 2016-04-20 DIAGNOSIS — C50811 Malignant neoplasm of overlapping sites of right female breast: Secondary | ICD-10-CM | POA: Diagnosis not present

## 2016-04-20 DIAGNOSIS — C7951 Secondary malignant neoplasm of bone: Secondary | ICD-10-CM

## 2016-04-20 DIAGNOSIS — C50911 Malignant neoplasm of unspecified site of right female breast: Secondary | ICD-10-CM

## 2016-04-20 MED ORDER — DEXTROSE 5 % IV SOLN
Freq: Once | INTRAVENOUS | Status: AC
  Administered 2016-04-20: 10:00:00 via INTRAVENOUS
  Filled 2016-04-20: qty 1000

## 2016-04-20 MED ORDER — DEXAMETHASONE SODIUM PHOSPHATE 10 MG/ML IJ SOLN
10.0000 mg | Freq: Once | INTRAMUSCULAR | Status: AC
  Administered 2016-04-20: 10 mg via INTRAVENOUS
  Filled 2016-04-20: qty 1

## 2016-04-20 MED ORDER — SODIUM CHLORIDE 0.9% FLUSH
10.0000 mL | INTRAVENOUS | Status: DC | PRN
  Administered 2016-04-20: 10 mL
  Filled 2016-04-20: qty 10

## 2016-04-20 MED ORDER — SODIUM CHLORIDE 0.9 % IV SOLN: 10.0000 mg | Freq: Once | INTRAVENOUS | Status: DC

## 2016-04-20 MED ORDER — DEXTROSE 5 % IV SOLN
45.0000 mg/m2 | Freq: Once | INTRAVENOUS | Status: AC
  Administered 2016-04-20: 114 mg via INTRAVENOUS
  Filled 2016-04-20: qty 7

## 2016-04-20 MED ORDER — HEPARIN SOD (PORK) LOCK FLUSH 100 UNIT/ML IV SOLN
500.0000 [IU] | Freq: Once | INTRAVENOUS | Status: AC | PRN
  Administered 2016-04-20: 500 [IU]
  Filled 2016-04-20: qty 5

## 2016-04-20 NOTE — Progress Notes (Signed)
Lab work from 04/11/16 reviewed by MD, Dr. Grayland Ormond. Per MD order: Proceed with treatment today, patient does not need any lab work drawn today.

## 2016-05-16 ENCOUNTER — Inpatient Hospital Stay: Payer: BC Managed Care – PPO

## 2016-05-16 ENCOUNTER — Inpatient Hospital Stay: Payer: BC Managed Care – PPO | Attending: Internal Medicine

## 2016-05-16 ENCOUNTER — Inpatient Hospital Stay (HOSPITAL_BASED_OUTPATIENT_CLINIC_OR_DEPARTMENT_OTHER): Payer: BC Managed Care – PPO | Admitting: Internal Medicine

## 2016-05-16 ENCOUNTER — Encounter: Payer: Self-pay | Admitting: *Deleted

## 2016-05-16 VITALS — BP 138/81 | HR 87 | Temp 97.6°F | Resp 22 | Ht 65.0 in | Wt 304.5 lb

## 2016-05-16 DIAGNOSIS — Z170 Estrogen receptor positive status [ER+]: Secondary | ICD-10-CM

## 2016-05-16 DIAGNOSIS — C50911 Malignant neoplasm of unspecified site of right female breast: Secondary | ICD-10-CM

## 2016-05-16 DIAGNOSIS — C50811 Malignant neoplasm of overlapping sites of right female breast: Secondary | ICD-10-CM

## 2016-05-16 DIAGNOSIS — Z23 Encounter for immunization: Secondary | ICD-10-CM | POA: Insufficient documentation

## 2016-05-16 DIAGNOSIS — M7989 Other specified soft tissue disorders: Secondary | ICD-10-CM

## 2016-05-16 DIAGNOSIS — C7970 Secondary malignant neoplasm of unspecified adrenal gland: Secondary | ICD-10-CM | POA: Insufficient documentation

## 2016-05-16 DIAGNOSIS — Z5111 Encounter for antineoplastic chemotherapy: Secondary | ICD-10-CM | POA: Insufficient documentation

## 2016-05-16 DIAGNOSIS — E669 Obesity, unspecified: Secondary | ICD-10-CM | POA: Insufficient documentation

## 2016-05-16 DIAGNOSIS — C7951 Secondary malignant neoplasm of bone: Secondary | ICD-10-CM | POA: Diagnosis not present

## 2016-05-16 DIAGNOSIS — Z7901 Long term (current) use of anticoagulants: Secondary | ICD-10-CM | POA: Diagnosis not present

## 2016-05-16 DIAGNOSIS — I252 Old myocardial infarction: Secondary | ICD-10-CM | POA: Diagnosis not present

## 2016-05-16 DIAGNOSIS — D649 Anemia, unspecified: Secondary | ICD-10-CM

## 2016-05-16 DIAGNOSIS — I10 Essential (primary) hypertension: Secondary | ICD-10-CM

## 2016-05-16 DIAGNOSIS — Z87891 Personal history of nicotine dependence: Secondary | ICD-10-CM | POA: Diagnosis not present

## 2016-05-16 DIAGNOSIS — I2699 Other pulmonary embolism without acute cor pulmonale: Secondary | ICD-10-CM

## 2016-05-16 DIAGNOSIS — G4730 Sleep apnea, unspecified: Secondary | ICD-10-CM | POA: Insufficient documentation

## 2016-05-16 DIAGNOSIS — R948 Abnormal results of function studies of other organs and systems: Secondary | ICD-10-CM

## 2016-05-16 DIAGNOSIS — Z79899 Other long term (current) drug therapy: Secondary | ICD-10-CM

## 2016-05-16 DIAGNOSIS — R222 Localized swelling, mass and lump, trunk: Secondary | ICD-10-CM

## 2016-05-16 LAB — CBC WITH DIFFERENTIAL/PLATELET
BASOS ABS: 0.1 10*3/uL (ref 0–0.1)
Basophils Relative: 2 %
Eosinophils Absolute: 0.1 10*3/uL (ref 0–0.7)
Eosinophils Relative: 2 %
HEMATOCRIT: 37.8 % (ref 35.0–47.0)
HEMOGLOBIN: 12.5 g/dL (ref 12.0–16.0)
LYMPHS PCT: 21 %
Lymphs Abs: 1.2 10*3/uL (ref 1.0–3.6)
MCH: 28.7 pg (ref 26.0–34.0)
MCHC: 33 g/dL (ref 32.0–36.0)
MCV: 87.1 fL (ref 80.0–100.0)
MONO ABS: 0.7 10*3/uL (ref 0.2–0.9)
MONOS PCT: 13 %
NEUTROS ABS: 3.5 10*3/uL (ref 1.4–6.5)
Neutrophils Relative %: 62 %
Platelets: 341 10*3/uL (ref 150–440)
RBC: 4.34 MIL/uL (ref 3.80–5.20)
RDW: 21.6 % — AB (ref 11.5–14.5)
WBC: 5.6 10*3/uL (ref 3.6–11.0)

## 2016-05-16 LAB — COMPREHENSIVE METABOLIC PANEL
ALBUMIN: 3.5 g/dL (ref 3.5–5.0)
ALT: 12 U/L — ABNORMAL LOW (ref 14–54)
ANION GAP: 6 (ref 5–15)
AST: 22 U/L (ref 15–41)
Alkaline Phosphatase: 55 U/L (ref 38–126)
BUN: 17 mg/dL (ref 6–20)
CO2: 23 mmol/L (ref 22–32)
Calcium: 9.2 mg/dL (ref 8.9–10.3)
Chloride: 111 mmol/L (ref 101–111)
Creatinine, Ser: 0.72 mg/dL (ref 0.44–1.00)
GFR calc Af Amer: >60 mL/min (ref >60)
GFR calc non Af Amer: >60 mL/min (ref >60)
GLUCOSE: 138 mg/dL — AB (ref 65–99)
POTASSIUM: 3.9 mmol/L (ref 3.5–5.1)
SODIUM: 140 mmol/L (ref 135–145)
TOTAL PROTEIN: 7.6 g/dL (ref 6.5–8.1)
Total Bilirubin: 0.5 mg/dL (ref 0.3–1.2)

## 2016-05-16 MED ORDER — DEXTROSE 5 % IV SOLN
Freq: Once | INTRAVENOUS | Status: AC
  Administered 2016-05-16: 10:00:00 via INTRAVENOUS
  Filled 2016-05-16: qty 1000

## 2016-05-16 MED ORDER — DOXORUBICIN HCL LIPOSOMAL CHEMO INJECTION 2 MG/ML
45.0000 mg/m2 | Freq: Once | INTRAVENOUS | Status: AC
  Administered 2016-05-16: 114 mg via INTRAVENOUS
  Filled 2016-05-16: qty 50

## 2016-05-16 MED ORDER — SODIUM CHLORIDE 0.9% FLUSH
10.0000 mL | INTRAVENOUS | Status: DC | PRN
  Administered 2016-05-16: 10 mL via INTRAVENOUS
  Filled 2016-05-16: qty 10

## 2016-05-16 MED ORDER — HEPARIN SOD (PORK) LOCK FLUSH 100 UNIT/ML IV SOLN
500.0000 [IU] | Freq: Once | INTRAVENOUS | Status: AC
  Administered 2016-05-16: 500 [IU] via INTRAVENOUS
  Filled 2016-05-16: qty 5

## 2016-05-16 MED ORDER — DEXAMETHASONE SODIUM PHOSPHATE 10 MG/ML IJ SOLN
10.0000 mg | Freq: Once | INTRAMUSCULAR | Status: AC
  Administered 2016-05-16: 10 mg via INTRAVENOUS
  Filled 2016-05-16: qty 1

## 2016-05-16 MED ORDER — INFLUENZA VAC SPLIT QUAD 0.5 ML IM SUSY
0.5000 mL | PREFILLED_SYRINGE | INTRAMUSCULAR | Status: AC
  Administered 2016-05-16: 0.5 mL via INTRAMUSCULAR
  Filled 2016-05-16: qty 0.5

## 2016-05-16 NOTE — Progress Notes (Signed)
Patient requesting referral to lymphedema clinic for right arm. Requesting that next tx be sch. On 06/16/16 because of her scheduled vacation to the beach in the early part of that week.  Pt requesting influenza vaccine today if md agrees. Also requesting application for handicap sticker.

## 2016-05-16 NOTE — Progress Notes (Signed)
[place]  Telephone:(336) [phone]  Fax:(336) [phone]     [name] DOB: 03-14-1956  MR#: [id number]  BBC#:[id number]  Patient Care Team: Kirk Ruths, MD as PCP - General (Internal Medicine)  CHIEF COMPLAINT:  Chief Complaint  Patient presents with  . Malignant neoplasm of overlapping sites of right breast    [place] OFFICE PROGRESS NOTE  Patient Care Team: Kirk Ruths, MD as PCP - General (Internal Medicine)  Breast cancer metastasized to bone [facility])   Staging form: Breast, AJCC 7th Edition     Clinical stage from 06/27/2013: Stage IV (T4, N2, M1) - Signed by [name], NP on 12/25/2014    Oncology History   1. RIGHT Breast cancer [Neglected] T4 N2 M1 ER positive HER-2 negative, stage IV disease of the right breast with metastases to thoracic spine, right chest wall, adrenal gland. Jan 2015- Letrozole + [name] 2016- CEA-223]  2. July 2016- [facility] study-random Taxol; Progression- CEA ~200s/Imaging  3. April 2017- ERIBULIN; AUG 14th 2017- PROGRESSION right shouler girdle/ Left adernal mass  # Feb 17 2016- GEM x2 cycles [poor tol-fevers/ Increased LFTs]; OCT 2017- CT- stable/ improved left adrenal mass  # OCT 25th 2017- DOXIL q 4w  # LYMPHEDEMA of RIGHT UE /Breast ulcerated lesion; AUG 2017 - [facility] PE- Eliquis     Breast cancer metastasized to bone ([place])   06/27/2013 Initial Diagnosis    Breast cancer metastasized to bone       Malignant neoplasm of overlapping sites of right breast ([name])   01/20/2016 Initial Diagnosis    Malignant neoplasm of overlapping sites of right breast [facility])      INTERVAL HISTORY:  A pleasant 60 year old morbidly obese Caucasian female patient with above history of metastatic breast cancer On Doxil is here for follow-up.  Patient's appetite is good. She notes Improvement of the right shoulder mass. Denies any bleeding from the breast mass. No nausea no vomiting.  Appetite improved.  Patient denies any unusual shortness of breath or cough. Denies any pain. Tingling and numbness in extremities- on Neurontin. Patient denies any nausea vomiting.  Patient notes to have mild increasing swelling of the right upper extremity. Interesting lymphedema referral.  REVIEW OF SYSTEMS:  A complete 10 point review of system is done which is negative except mentioned above/history of present illness.   PAST MEDICAL HISTORY :  Past Medical History:  Diagnosis Date  . Anemia   . Anginal pain ([place])   . Breast cancer ([place])    2014   . Breast cancer metastasized to bone ([place]) 11/21/2014  . Hypertension   . Last menstrual period (LMP) > 10 days ago 2007  . Lymphedema of right upper extremity   . Myocardial infarction   . Post-menopausal 2006  . Sleep apnea   . Wound drainage     PAST SURGICAL HISTORY :   Past Surgical History:  Procedure Laterality Date  . CORONARY ANGIOPLASTY WITH STENT PLACEMENT N/A   . HERNIA REPAIR     umbilical hernia repair  . PORTACATH PLACEMENT Right 02/03/2015   Procedure: INSERTION PORT-A-CATH;  Surgeon: [name], MD;  Location: [facility] ORS;  Service: General;  Laterality: Right;    FAMILY HISTORY :   Family History  Problem Relation Age of Onset  . Atrial fibrillation Sister   . Hypertension Sister     SOCIAL HISTORY:   Social History  Substance Use Topics  . Smoking status: Former Smoker  Packs/day: 1.00    Types: Cigarettes    Quit date: 10/25/2004  . Smokeless tobacco: Never Used     Comment: stopped smoking greater than 7 years ago  . Alcohol use No    ALLERGIES:  is allergic to no known allergies.  MEDICATIONS:  Current Outpatient Prescriptions  Medication Sig Dispense Refill  . acetaminophen (TYLENOL) 500 MG tablet Take 1,000 mg by mouth every 6 (six) hours as needed.    Marland Kitchen apixaban (ELIQUIS) 5 MG TABS tablet Take 2 tablets (10 mg total) by mouth 2 (two) times daily. 28 tablet 0  . apixaban (ELIQUIS) 5  MG TABS tablet Take 1 tablet (5 mg total) by mouth 2 (two) times daily. 60 tablet 3  . clopidogrel (PLAVIX) 75 MG tablet Take 75 mg by mouth daily.    . [employer] CALCIUM 600+D 600-800 MG-UNIT TABS Take 1 tablet by mouth daily. At noon  3  . fexofenadine (ALLEGRA) 180 MG tablet Take 180 mg by mouth daily.     . furosemide (LASIX) 20 MG tablet Take 20 mg by mouth daily.  1  . gabapentin (NEURONTIN) 300 MG capsule Take 1 pill in AM; and 2 pills at PM. 90 capsule 3  . lidocaine-prilocaine (EMLA) cream APPLY TOPICALLY AS NEEDED 30 g 2  . lisinopril (PRINIVIL,ZESTRIL) 20 MG tablet Take 20 mg by mouth daily.    . metoprolol succinate (TOPROL-XL) 50 MG 24 hr tablet Take 50 mg by mouth 2 (two) times daily. Take with or immediately following a meal.    . omega-3 acid ethyl esters (LOVAZA) 1 G capsule Take 1 g by mouth at bedtime.    . ondansetron (ZOFRAN) 8 MG tablet TAKE 1 TABLET (8MG) BY MOUTH TWICE DAILY START THE DAY AFTER CHEMOTHERAPY FOR 2 DAYS THEN TAKE AS NEEDED FOR NAUSEA OR VOMITING 30 tablet 0  . PHOSPHA 250 NEUTRAL 155-852-130 MG tablet TAKE 1 TABLET BY MOUTH TWICE DAILY 60 tablet 4  . potassium chloride (K-DUR) 10 MEQ tablet Take 10 mEq by mouth daily.   1  . simvastatin (ZOCOR) 80 MG tablet Take 80 mg by mouth at bedtime.     No current facility-administered medications for this visit.    Facility-Administered Medications Ordered in Other Visits  Medication Dose Route Frequency Provider Last Rate Last Dose  . sodium chloride 0.9 % injection 10 mL  10 mL Intracatheter PRN Forest Gleason, MD   10 mL at 07/07/15 0911  . sodium chloride flush (NS) 0.9 % injection 10 mL  10 mL Intravenous PRN Forest Gleason, MD      . sodium chloride flush (NS) 0.9 % injection 10 mL  10 mL Intravenous PRN Cammie Sickle, MD   10 mL at 01/20/16 1320  . sodium chloride flush (NS) 0.9 % injection 10 mL  10 mL Intravenous PRN Cammie Sickle, MD   10 mL at 05/16/16 0902    PHYSICAL EXAMINATION: ECOG  PERFORMANCE STATUS: 1 - Symptomatic but completely ambulatory  BP 138/81 (Patient Position: Sitting) Comment: left arm  Pulse 87   Temp 97.6 F (36.4 C) (Tympanic)   Resp (!) 22   Ht 5' 5"  (1.651 m)   Wt (!) 304 lb 8 oz (138.1 kg)   BMI 50.67 kg/m   Filed Weights   05/16/16 0935  Weight: (!) 304 lb 8 oz (138.1 kg)    GENERAL: Well-nourished well-developed; Alert, no distress and comfortable.  She is Accompanied by her husband. She is a wheelchair.. Morbidly obese.  EYES: no pallor or icterus OROPHARYNX: no thrush or ulceration;  NECK: supple, no masses felt LYMPH:  no palpable lymphadenopathy in the cervical, axillary or inguinal regions LUNGS: clear to auscultation and  No wheeze or crackles HEART/CVS: regular rate & rhythm and no murmurs; chronic bilateral lower extremity edema. ABDOMEN:abdomen soft, non-tender and normal bowel sounds Musculoskeletal:no cyanosis of digits and no clubbing; right upper extremity lymphedema noted. PSYCH: alert & oriented x 3 with fluent speech NEURO: no focal motor/sensory deficits SKIN:   Right breast ulcerated/ scabbing lesion noted; no drainage. Right anterior [4cm] shoulder; shoulder lesions [ 4x4 cm in size] noted.  LABORATORY DATA:  I have reviewed the data as listed    Component Value Date/Time   NA 140 05/16/2016 0902   NA 142 08/01/2014 1435   K 3.9 05/16/2016 0902   K 3.5 08/01/2014 1435   CL 111 05/16/2016 0902   CL 103 08/01/2014 1435   CO2 23 05/16/2016 0902   CO2 27 08/01/2014 1435   GLUCOSE 138 (H) 05/16/2016 0902   GLUCOSE 148 (H) 08/01/2014 1435   BUN 17 05/16/2016 0902   BUN 13 08/01/2014 1435   CREATININE 0.72 05/16/2016 0902   CREATININE 0.71 10/02/2014 0930   CALCIUM 9.2 05/16/2016 0902   CALCIUM 8.6 (L) 10/02/2014 0930   PROT 7.6 05/16/2016 0902   PROT 7.6 10/02/2014 0930   ALBUMIN 3.5 05/16/2016 0902   ALBUMIN 3.7 10/02/2014 0930   AST 22 05/16/2016 0902   AST 21 10/02/2014 0930   ALT 12 (L) 05/16/2016 0902    ALT 23 10/02/2014 0930   ALKPHOS 55 05/16/2016 0902   ALKPHOS 41 10/02/2014 0930   BILITOT 0.5 05/16/2016 0902   BILITOT 0.5 10/02/2014 0930   GFRNONAA >60 05/16/2016 0902   GFRNONAA >60 10/02/2014 0930   GFRAA >60 05/16/2016 0902   GFRAA >60 10/02/2014 0930    No results found for: SPEP, UPEP  Lab Results  Component Value Date   WBC 5.6 05/16/2016   NEUTROABS 3.5 05/16/2016   HGB 12.5 05/16/2016   HCT 37.8 05/16/2016   MCV 87.1 05/16/2016   PLT 341 05/16/2016      Chemistry      Component Value Date/Time   NA 140 05/16/2016 0902   NA 142 08/01/2014 1435   K 3.9 05/16/2016 0902   K 3.5 08/01/2014 1435   CL 111 05/16/2016 0902   CL 103 08/01/2014 1435   CO2 23 05/16/2016 0902   CO2 27 08/01/2014 1435   BUN 17 05/16/2016 0902   BUN 13 08/01/2014 1435   CREATININE 0.72 05/16/2016 0902   CREATININE 0.71 10/02/2014 0930      Component Value Date/Time   CALCIUM 9.2 05/16/2016 0902   CALCIUM 8.6 (L) 10/02/2014 0930   ALKPHOS 55 05/16/2016 0902   ALKPHOS 41 10/02/2014 0930   AST 22 05/16/2016 0902   AST 21 10/02/2014 0930   ALT 12 (L) 05/16/2016 0902   ALT 23 10/02/2014 0930   BILITOT 0.5 05/16/2016 0902   BILITOT 0.5 10/02/2014 0930      IMPRESSION: 1. In general, the metastatic lesions shown on the prior exam are stable with the exception of the left adrenal metastatic lesion which is reduced in size. 2. 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 3. Extensive atherosclerosis and mild cardiomegaly. 4. There is some vague indistinctness of the anterior cortical margin of the left iliac bone. There could  be some tumor eroding this bone all, along the left iliacus muscle, but I am not certain. There is enough artifact that this is questionable. There is also questionable lesions in the left upper sacrum, along with a small amount of presacral edema.  RADIOGRAPHIC STUDIES: I  have personally reviewed the radiological images as listed and agreed with the findings in the report. No results found.    ASSESSMENT & PLAN:  Carcinoma of overlapping sites of right breast in female, estrogen receptor positive ([place]) Metastatic breast cancer- Aug 2017- CAT scan shows progressive disease in the right shoulder/underarm; and also the adrenal lesion. Currently in Doxil every 28 days; s/p cycle #1. Clinically response noted.   # proceed with cycle # 2 of Doxil today.  [MUGA scan EF -60%]  # LFTs- AST ALT I improved.   # PE- Moderate- based on CT imaging incidental. Patient is asymptomatic. Patient is on Eliquis.   # Restless legs ? Chemo.   # [place] ot have flu shot.   # Lymphedema RIght UE- Secondary to malignancy; slightly worse; recommend lymphedema referral.   # follow up with me in 4 weeks/with third cycle of chemo/ covering provider. On dec 21st. wil need CT chest/ab/pelvis with contrast ordered at that visit.   Orders Placed This Encounter  Procedures  . Comprehensive metabolic panel    Standing Status:   Future    Standing Expiration Date:   05/16/2017  . CBC with Differential    Standing Status:   Future    Standing Expiration Date:   05/16/2017  . Cancer antigen 27.29    Standing Status:   Future    Standing Expiration Date:   05/16/2017   All questions were answered. The patient knows to call the clinic with any problems, questions or concerns.      Cammie Sickle, MD 05/16/2016 1:47 PMp  Review of Systems  All other systems reviewed and are negative.          05/16/2016

## 2016-05-16 NOTE — Assessment & Plan Note (Addendum)
Metastatic breast cancer- Aug 2017- CAT scan shows progressive disease in the right shoulder/underarm; and also the adrenal lesion. Currently in Doxil every 28 days; s/p cycle #1. Clinically response noted.   # proceed with cycle # 2 of Doxil today.  [MUGA scan EF -60%]  # LFTs- AST ALT I improved.   # PE- Moderate- based on CT imaging incidental. Patient is asymptomatic. Patient is on Eliquis.   # Restless legs ? Chemo.   # [place] ot have flu shot.   # Lymphedema RIght UE- Secondary to malignancy; slightly worse; recommend lymphedema referral.   # follow up with me in 4 weeks/with third cycle of chemo/ covering provider. On dec 21st. wil need CT chest/ab/pelvis with contrast ordered at that visit.

## 2016-06-16 ENCOUNTER — Inpatient Hospital Stay: Payer: BC Managed Care – PPO

## 2016-06-16 ENCOUNTER — Inpatient Hospital Stay: Payer: BC Managed Care – PPO | Attending: Oncology

## 2016-06-16 ENCOUNTER — Inpatient Hospital Stay (HOSPITAL_BASED_OUTPATIENT_CLINIC_OR_DEPARTMENT_OTHER): Payer: BC Managed Care – PPO | Admitting: Oncology

## 2016-06-16 VITALS — BP 147/84 | HR 93 | Temp 98.0°F | Resp 18 | Wt 306.7 lb

## 2016-06-16 DIAGNOSIS — R531 Weakness: Secondary | ICD-10-CM | POA: Insufficient documentation

## 2016-06-16 DIAGNOSIS — Z87891 Personal history of nicotine dependence: Secondary | ICD-10-CM | POA: Insufficient documentation

## 2016-06-16 DIAGNOSIS — Z170 Estrogen receptor positive status [ER+]: Secondary | ICD-10-CM | POA: Diagnosis not present

## 2016-06-16 DIAGNOSIS — I890 Lymphedema, not elsewhere classified: Secondary | ICD-10-CM | POA: Insufficient documentation

## 2016-06-16 DIAGNOSIS — C50911 Malignant neoplasm of unspecified site of right female breast: Secondary | ICD-10-CM

## 2016-06-16 DIAGNOSIS — Z5111 Encounter for antineoplastic chemotherapy: Secondary | ICD-10-CM | POA: Insufficient documentation

## 2016-06-16 DIAGNOSIS — C50811 Malignant neoplasm of overlapping sites of right female breast: Secondary | ICD-10-CM | POA: Diagnosis not present

## 2016-06-16 DIAGNOSIS — C7951 Secondary malignant neoplasm of bone: Secondary | ICD-10-CM | POA: Diagnosis not present

## 2016-06-16 DIAGNOSIS — I252 Old myocardial infarction: Secondary | ICD-10-CM

## 2016-06-16 DIAGNOSIS — Z7901 Long term (current) use of anticoagulants: Secondary | ICD-10-CM | POA: Insufficient documentation

## 2016-06-16 DIAGNOSIS — I10 Essential (primary) hypertension: Secondary | ICD-10-CM

## 2016-06-16 DIAGNOSIS — R5383 Other fatigue: Secondary | ICD-10-CM

## 2016-06-16 DIAGNOSIS — G4730 Sleep apnea, unspecified: Secondary | ICD-10-CM | POA: Insufficient documentation

## 2016-06-16 LAB — CBC WITH DIFFERENTIAL/PLATELET
Basophils Absolute: 0.1 10*3/uL (ref 0–0.1)
Basophils Relative: 1 %
EOS ABS: 0.2 10*3/uL (ref 0–0.7)
EOS PCT: 2 %
HCT: 36.3 % (ref 35.0–47.0)
Hemoglobin: 12 g/dL (ref 12.0–16.0)
LYMPHS ABS: 1.3 10*3/uL (ref 1.0–3.6)
Lymphocytes Relative: 16 %
MCH: 28.6 pg (ref 26.0–34.0)
MCHC: 33 g/dL (ref 32.0–36.0)
MCV: 86.6 fL (ref 80.0–100.0)
MONO ABS: 0.9 10*3/uL (ref 0.2–0.9)
MONOS PCT: 12 %
Neutro Abs: 5.5 10*3/uL (ref 1.4–6.5)
Neutrophils Relative %: 69 %
PLATELETS: 314 10*3/uL (ref 150–440)
RBC: 4.2 MIL/uL (ref 3.80–5.20)
RDW: 20.7 % — AB (ref 11.5–14.5)
WBC: 7.9 10*3/uL (ref 3.6–11.0)

## 2016-06-16 LAB — COMPREHENSIVE METABOLIC PANEL
ALT: 12 U/L — AB (ref 14–54)
AST: 21 U/L (ref 15–41)
Albumin: 3.2 g/dL — ABNORMAL LOW (ref 3.5–5.0)
Alkaline Phosphatase: 59 U/L (ref 38–126)
Anion gap: 8 (ref 5–15)
BUN: 15 mg/dL (ref 6–20)
CHLORIDE: 107 mmol/L (ref 101–111)
CO2: 23 mmol/L (ref 22–32)
CREATININE: 0.69 mg/dL (ref 0.44–1.00)
Calcium: 8.9 mg/dL (ref 8.9–10.3)
GFR calc Af Amer: >60 mL/min (ref >60)
GLUCOSE: 143 mg/dL — AB (ref 65–99)
Potassium: 3.9 mmol/L (ref 3.5–5.1)
Sodium: 138 mmol/L (ref 135–145)
Total Bilirubin: 0.4 mg/dL (ref 0.3–1.2)
Total Protein: 7.2 g/dL (ref 6.5–8.1)

## 2016-06-16 MED ORDER — DOXORUBICIN HCL LIPOSOMAL CHEMO INJECTION 2 MG/ML
45.0000 mg/m2 | Freq: Once | INTRAVENOUS | Status: AC
  Administered 2016-06-16: 114 mg via INTRAVENOUS
  Filled 2016-06-16: qty 50

## 2016-06-16 MED ORDER — APIXABAN 5 MG PO TABS: 5.0000 mg | ORAL_TABLET | Freq: Two times a day (BID) | ORAL | 0 refills | Status: DC

## 2016-06-16 MED ORDER — DEXTROSE 5 % IV SOLN
Freq: Once | INTRAVENOUS | Status: AC
  Administered 2016-06-16: 11:00:00 via INTRAVENOUS
  Filled 2016-06-16: qty 1000

## 2016-06-16 MED ORDER — HEPARIN SOD (PORK) LOCK FLUSH 100 UNIT/ML IV SOLN
500.0000 [IU] | Freq: Once | INTRAVENOUS | Status: AC | PRN
  Administered 2016-06-16: 500 [IU]
  Filled 2016-06-16: qty 5

## 2016-06-16 MED ORDER — DEXAMETHASONE SODIUM PHOSPHATE 10 MG/ML IJ SOLN
10.0000 mg | Freq: Once | INTRAMUSCULAR | Status: AC
  Administered 2016-06-16: 10 mg via INTRAVENOUS
  Filled 2016-06-16: qty 1

## 2016-06-16 NOTE — Progress Notes (Signed)
Patient will need prescription on Eliquis.

## 2016-06-17 LAB — CANCER ANTIGEN 27.29: CA 27.29: 36.5 U/mL (ref 0.0–38.6)

## 2016-06-19 NOTE — Progress Notes (Signed)
[name]  Telephone:(336) [phone] Fax:(336) [phone]  ID: NAVAE FRANCZAK OB: 1955-08-13  MR#: [id number]  [id number]  Patient Care Team: Kirk Ruths, MD as PCP - General (Internal Medicine)  CHIEF COMPLAINT: Breast cancer metastasized to bone.  INTERVAL HISTORY: Patient returns to clinic today for further evaluation and her third infusion of single agent Doxil. She is tolerating her treatments well. She has no neurologic complaints. She denies any recent fevers. She does not complain of pain today. She has no chest pain or shortness of breath. She denies any nausea, vomiting, constipation, or diarrhea. She has no urinary complaints. Patient offers no specific complaints today.  REVIEW OF SYSTEMS:   Review of Systems  Constitutional: Positive for malaise/fatigue. Negative for fever and weight loss.  Respiratory: Negative.  Negative for cough and shortness of breath.   Cardiovascular: Negative.  Negative for chest pain and leg swelling.  Gastrointestinal: Negative.  Negative for abdominal pain.  Musculoskeletal: Negative.   Neurological: Positive for weakness.  Psychiatric/Behavioral: Negative.  The patient is not nervous/anxious.     As per HPI. Otherwise, a complete review of systems is negative.  PAST MEDICAL HISTORY: Past Medical History:  Diagnosis Date  . Anemia   . Anginal pain ([place])   . Breast cancer ([place])    2014   . Breast cancer metastasized to bone ([place]) 11/21/2014  . Hypertension   . Last menstrual period (LMP) > 10 days ago 2007  . Lymphedema of right upper extremity   . Myocardial infarction   . Post-menopausal 2006  . Sleep apnea   . Wound drainage     PAST SURGICAL HISTORY: Past Surgical History:  Procedure Laterality Date  . CORONARY ANGIOPLASTY WITH STENT PLACEMENT N/A   . HERNIA REPAIR     umbilical hernia repair  . PORTACATH PLACEMENT Right 02/03/2015   Procedure: INSERTION PORT-A-CATH;  Surgeon: [name], MD;  Location: [facility] ORS;  Service: General;  Laterality: Right;    FAMILY HISTORY: Family History  Problem Relation Age of Onset  . Atrial fibrillation Sister   . Hypertension Sister     ADVANCED DIRECTIVES (Y/N):  N  HEALTH MAINTENANCE: Social History  Substance Use Topics  . Smoking status: Former Smoker    Packs/day: 1.00    Types: Cigarettes    Quit date: 10/25/2004  . Smokeless tobacco: Never Used     Comment: stopped smoking greater than 7 years ago  . Alcohol use No     Colonoscopy:  PAP:  Bone density:  Lipid panel:  Allergies  Allergen Reactions  . No Known Allergies     Current Outpatient Prescriptions  Medication Sig Dispense Refill  . acetaminophen (TYLENOL) 500 MG tablet Take 1,000 mg by mouth every 6 (six) hours as needed.    Marland Kitchen apixaban (ELIQUIS) 5 MG TABS tablet Take 2 tablets (10 mg total) by mouth 2 (two) times daily. 28 tablet 0  . apixaban (ELIQUIS) 5 MG TABS tablet Take 1 tablet (5 mg total) by mouth 2 (two) times daily. 60 tablet 0  . clopidogrel (PLAVIX) 75 MG tablet Take 75 mg by mouth daily.    . [facility] CALCIUM 600+D 600-800 MG-UNIT TABS Take 1 tablet by mouth daily. At noon  3  . fexofenadine (ALLEGRA) 180 MG tablet Take 180 mg by mouth daily.     . furosemide (LASIX) 20 MG tablet Take 20 mg by mouth daily.  1  . gabapentin (NEURONTIN) 300 MG capsule Take 1 pill  in AM; and 2 pills at PM. 90 capsule 3  . lidocaine-prilocaine (EMLA) cream APPLY TOPICALLY AS NEEDED 30 g 2  . lisinopril (PRINIVIL,ZESTRIL) 20 MG tablet Take 20 mg by mouth daily.    . metoprolol succinate (TOPROL-XL) 50 MG 24 hr tablet Take 50 mg by mouth 2 (two) times daily. Take with or immediately following a meal.    . omega-3 acid ethyl esters (LOVAZA) 1 G capsule Take 1 g by mouth at bedtime.    . ondansetron (ZOFRAN) 8 MG tablet TAKE 1 TABLET (8MG ) BY MOUTH TWICE DAILY START THE DAY AFTER CHEMOTHERAPY FOR 2 DAYS THEN TAKE AS NEEDED FOR NAUSEA OR VOMITING 30 tablet 0  .  PHOSPHA 250 NEUTRAL 155-852-130 MG tablet TAKE 1 TABLET BY MOUTH TWICE DAILY 60 tablet 4  . potassium chloride (K-DUR) 10 MEQ tablet Take 10 mEq by mouth daily.   1  . simvastatin (ZOCOR) 80 MG tablet Take 80 mg by mouth at bedtime.     No current facility-administered medications for this visit.    Facility-Administered Medications Ordered in Other Visits  Medication Dose Route Frequency Provider Last Rate Last Dose  . sodium chloride 0.9 % injection 10 mL  10 mL Intracatheter PRN Forest Gleason, MD   10 mL at 07/07/15 0911  . sodium chloride flush (NS) 0.9 % injection 10 mL  10 mL Intravenous PRN Forest Gleason, MD      . sodium chloride flush (NS) 0.9 % injection 10 mL  10 mL Intravenous PRN Cammie Sickle, MD   10 mL at 01/20/16 1320    OBJECTIVE: Vitals:   06/16/16 0927  BP: (!) 147/84  Pulse: 93  Resp: 18  Temp: 98 F (36.7 C)     Body mass index is 51.03 kg/m.    ECOG FS:1 - Symptomatic but completely ambulatory  General: Well-developed, well-nourished, no acute distress.Sitting in a wheelchair. Eyes: Pink conjunctiva, anicteric sclera. HEENT: Normocephalic, moist mucous membranes, clear oropharnyx. Lungs: Clear to auscultation bilaterally. Heart: Regular rate and rhythm. No rubs, murmurs, or gallops. Abdomen: Soft, nontender, nondistended. No organomegaly noted, normoactive bowel sounds. Musculoskeletal: No edema, cyanosis, or clubbing. Neuro: Alert, answering all questions appropriately. Cranial nerves grossly intact. Skin: No rashes or petechiae noted. Psych: Normal affect. Lymphatics: No cervical, calvicular, axillary or inguinal LAD.   LAB RESULTS:  Lab Results  Component Value Date   NA 138 06/16/2016   K 3.9 06/16/2016   CL 107 06/16/2016   CO2 23 06/16/2016   GLUCOSE 143 (H) 06/16/2016   BUN 15 06/16/2016   CREATININE 0.69 06/16/2016   CALCIUM 8.9 06/16/2016   PROT 7.2 06/16/2016   ALBUMIN 3.2 (L) 06/16/2016   AST 21 06/16/2016   ALT 12 (L)  06/16/2016   ALKPHOS 59 06/16/2016   BILITOT 0.4 06/16/2016   GFRNONAA >60 06/16/2016   GFRAA >60 06/16/2016    Lab Results  Component Value Date   WBC 7.9 06/16/2016   NEUTROABS 5.5 06/16/2016   HGB 12.0 06/16/2016   HCT 36.3 06/16/2016   MCV 86.6 06/16/2016   PLT 314 06/16/2016     STUDIES: No results found.  ASSESSMENT: Breast cancer metastasized to bone.  PLAN:    Metastatic breast cancer- Aug 2017- CAT scan shows progressive disease in the right shoulder/underarm; and also the adrenal lesion. Currently in Doxil every 28 days; s/p cycle #1. Clinically response noted.   # proceed with cycle # 3 of Doxil today.  [MUGA scan EF -60%]  #  LFTs- within normal limits today.   # PE- Moderate- based on CT imaging of September, incidental. Patient is asymptomatic. Patient was given a refill of her Eliquis.   # Restless legs- patient does not complain of this today.   # Lymphedema RIght UE- Secondary to malignancy; slightly worse; lymphedema referral.   # follow up with me in 4 weeks for cycle 4. Will get CT of chest abdomen and pelvis 1-2 days prior to clinic.  Patient expressed understanding and was in agreement with this plan. She also understands that She can call clinic at any time with any questions, concerns, or complaints.   Breast cancer metastasized to bone [facility])   Staging form: Breast, AJCC 7th Edition   - Clinical stage from 06/27/2013: Stage IV (T4, N2, M1) - Signed by [name], NP on 12/25/2014  Malignant neoplasm of overlapping sites of right breast [facility])   Staging form: Breast, AJCC 7th Edition   - Clinical: Stage IV (T4, N2, M1) - Signed by Cammie Sickle, MD on 02/10/2016  Lloyd Huger, MD   06/19/2016 8:27 AM

## 2016-06-23 ENCOUNTER — Ambulatory Visit: Payer: BC Managed Care – PPO | Admitting: Occupational Therapy

## 2016-06-30 ENCOUNTER — Telehealth: Payer: Self-pay | Admitting: *Deleted

## 2016-06-30 NOTE — Telephone Encounter (Signed)
T/C made to Edwena Felty for follow up due on [employer] [id number] [employer] Research study. Patient states she is doing well, but still having some problems with neuropathy. She is scheduled for a follow up CT scan next week and states Dr. Rogue Bussing is treating her with Doxil now. Will follow up CT scan results and plan to see patient when in clinic next. Yolande Jolly, BSN, MHA, OCN 06/30/2016 12:05 PM

## 2016-07-08 ENCOUNTER — Ambulatory Visit
Admission: RE | Admit: 2016-07-08 | Discharge: 2016-07-08 | Disposition: A | Discharge status: Home / Self Care | Payer: BC Managed Care – PPO | Source: Ambulatory Visit | Attending: Oncology | Admitting: Oncology

## 2016-07-08 DIAGNOSIS — C50911 Malignant neoplasm of unspecified site of right female breast: Secondary | ICD-10-CM | POA: Insufficient documentation

## 2016-07-08 DIAGNOSIS — I2510 Atherosclerotic heart disease of native coronary artery without angina pectoris: Secondary | ICD-10-CM | POA: Insufficient documentation

## 2016-07-08 DIAGNOSIS — K5730 Diverticulosis of large intestine without perforation or abscess without bleeding: Secondary | ICD-10-CM | POA: Insufficient documentation

## 2016-07-08 DIAGNOSIS — C7951 Secondary malignant neoplasm of bone: Secondary | ICD-10-CM | POA: Diagnosis present

## 2016-07-08 DIAGNOSIS — I700 Atherosclerosis of aorta: Secondary | ICD-10-CM | POA: Insufficient documentation

## 2016-07-08 MED ORDER — IOPAMIDOL (ISOVUE-370) INJECTION 76%
125.0000 mL | Freq: Once | INTRAVENOUS | Status: AC | PRN
  Administered 2016-07-08: 125 mL via INTRAVENOUS

## 2016-07-14 ENCOUNTER — Inpatient Hospital Stay: Payer: BC Managed Care – PPO

## 2016-07-14 ENCOUNTER — Inpatient Hospital Stay: Payer: BC Managed Care – PPO | Admitting: Internal Medicine

## 2016-07-15 ENCOUNTER — Other Ambulatory Visit: Payer: Self-pay | Admitting: *Deleted

## 2016-07-15 DIAGNOSIS — C7951 Secondary malignant neoplasm of bone: Secondary | ICD-10-CM

## 2016-07-18 ENCOUNTER — Inpatient Hospital Stay: Payer: BC Managed Care – PPO | Attending: Internal Medicine

## 2016-07-18 ENCOUNTER — Inpatient Hospital Stay: Payer: BC Managed Care – PPO

## 2016-07-18 ENCOUNTER — Inpatient Hospital Stay (HOSPITAL_BASED_OUTPATIENT_CLINIC_OR_DEPARTMENT_OTHER): Payer: BC Managed Care – PPO | Admitting: Internal Medicine

## 2016-07-18 VITALS — BP 141/89 | HR 108 | Temp 97.5°F | Wt 305.0 lb

## 2016-07-18 DIAGNOSIS — E669 Obesity, unspecified: Secondary | ICD-10-CM | POA: Diagnosis not present

## 2016-07-18 DIAGNOSIS — Z79899 Other long term (current) drug therapy: Secondary | ICD-10-CM

## 2016-07-18 DIAGNOSIS — L988 Other specified disorders of the skin and subcutaneous tissue: Secondary | ICD-10-CM

## 2016-07-18 DIAGNOSIS — Z86711 Personal history of pulmonary embolism: Secondary | ICD-10-CM

## 2016-07-18 DIAGNOSIS — Z87891 Personal history of nicotine dependence: Secondary | ICD-10-CM

## 2016-07-18 DIAGNOSIS — I10 Essential (primary) hypertension: Secondary | ICD-10-CM

## 2016-07-18 DIAGNOSIS — I890 Lymphedema, not elsewhere classified: Secondary | ICD-10-CM

## 2016-07-18 DIAGNOSIS — C7951 Secondary malignant neoplasm of bone: Secondary | ICD-10-CM | POA: Diagnosis not present

## 2016-07-18 DIAGNOSIS — R948 Abnormal results of function studies of other organs and systems: Secondary | ICD-10-CM | POA: Insufficient documentation

## 2016-07-18 DIAGNOSIS — C50811 Malignant neoplasm of overlapping sites of right female breast: Secondary | ICD-10-CM

## 2016-07-18 DIAGNOSIS — Z170 Estrogen receptor positive status [ER+]: Secondary | ICD-10-CM | POA: Insufficient documentation

## 2016-07-18 DIAGNOSIS — C7972 Secondary malignant neoplasm of left adrenal gland: Secondary | ICD-10-CM

## 2016-07-18 DIAGNOSIS — C50911 Malignant neoplasm of unspecified site of right female breast: Secondary | ICD-10-CM

## 2016-07-18 DIAGNOSIS — I252 Old myocardial infarction: Secondary | ICD-10-CM

## 2016-07-18 DIAGNOSIS — Z5111 Encounter for antineoplastic chemotherapy: Secondary | ICD-10-CM | POA: Insufficient documentation

## 2016-07-18 LAB — COMPREHENSIVE METABOLIC PANEL
ALK PHOS: 65 U/L (ref 38–126)
ALT: 12 U/L — AB (ref 14–54)
AST: 22 U/L (ref 15–41)
Albumin: 3.3 g/dL — ABNORMAL LOW (ref 3.5–5.0)
Anion gap: 7 (ref 5–15)
BUN: 16 mg/dL (ref 6–20)
CALCIUM: 9 mg/dL (ref 8.9–10.3)
CHLORIDE: 107 mmol/L (ref 101–111)
CO2: 24 mmol/L (ref 22–32)
CREATININE: 0.73 mg/dL (ref 0.44–1.00)
Glucose, Bld: 163 mg/dL — ABNORMAL HIGH (ref 65–99)
Potassium: 3.9 mmol/L (ref 3.5–5.1)
Sodium: 138 mmol/L (ref 135–145)
Total Bilirubin: 0.5 mg/dL (ref 0.3–1.2)
Total Protein: 7.6 g/dL (ref 6.5–8.1)

## 2016-07-18 LAB — CBC WITH DIFFERENTIAL/PLATELET
BASOS ABS: 0.1 10*3/uL (ref 0–0.1)
Basophils Relative: 1 %
Eosinophils Absolute: 0.1 10*3/uL (ref 0–0.7)
Eosinophils Relative: 1 %
HEMATOCRIT: 37.3 % (ref 35.0–47.0)
HEMOGLOBIN: 12.3 g/dL (ref 12.0–16.0)
LYMPHS ABS: 1.1 10*3/uL (ref 1.0–3.6)
LYMPHS PCT: 13 %
MCH: 28.5 pg (ref 26.0–34.0)
MCHC: 33.1 g/dL (ref 32.0–36.0)
MCV: 86.1 fL (ref 80.0–100.0)
Monocytes Absolute: 0.8 10*3/uL (ref 0.2–0.9)
Monocytes Relative: 9 %
NEUTROS ABS: 6.7 10*3/uL — AB (ref 1.4–6.5)
NEUTROS PCT: 76 %
PLATELETS: 302 10*3/uL (ref 150–440)
RBC: 4.33 MIL/uL (ref 3.80–5.20)
RDW: 20.8 % — ABNORMAL HIGH (ref 11.5–14.5)
WBC: 8.8 10*3/uL (ref 3.6–11.0)

## 2016-07-18 MED ORDER — AMOXICILLIN-POT CLAVULANATE 875-125 MG PO TABS: 1.0000 | ORAL_TABLET | Freq: Two times a day (BID) | ORAL | 0 refills | Status: DC

## 2016-07-18 MED ORDER — DOXORUBICIN HCL LIPOSOMAL CHEMO INJECTION 2 MG/ML
45.0000 mg/m2 | Freq: Once | INTRAVENOUS | Status: AC
  Administered 2016-07-18: 114 mg via INTRAVENOUS
  Filled 2016-07-18: qty 50

## 2016-07-18 MED ORDER — HEPARIN SOD (PORK) LOCK FLUSH 100 UNIT/ML IV SOLN
500.0000 [IU] | Freq: Once | INTRAVENOUS | Status: AC
  Administered 2016-07-18: 500 [IU] via INTRAVENOUS
  Filled 2016-07-18: qty 5

## 2016-07-18 MED ORDER — SODIUM CHLORIDE 0.9% FLUSH
10.0000 mL | INTRAVENOUS | Status: DC | PRN
  Administered 2016-07-18: 10 mL via INTRAVENOUS
  Filled 2016-07-18: qty 10

## 2016-07-18 MED ORDER — APIXABAN 5 MG PO TABS: 5.0000 mg | ORAL_TABLET | Freq: Two times a day (BID) | ORAL | 1 refills | Status: DC

## 2016-07-18 MED ORDER — DEXTROSE 5 % IV SOLN
Freq: Once | INTRAVENOUS | Status: AC
  Administered 2016-07-18: 11:00:00 via INTRAVENOUS
  Filled 2016-07-18: qty 1000

## 2016-07-18 MED ORDER — DEXAMETHASONE SODIUM PHOSPHATE 10 MG/ML IJ SOLN
10.0000 mg | Freq: Once | INTRAMUSCULAR | Status: AC
  Administered 2016-07-18: 10 mg via INTRAVENOUS
  Filled 2016-07-18: qty 1

## 2016-07-18 NOTE — Progress Notes (Signed)
[name]  [phone]  [phone]     [name] DOB: 31-May-1956  MR#: [id number]  PYK#:[id number]  Patient Care Team: Kirk Ruths, MD as PCP - General (Internal Medicine)  CHIEF COMPLAINT:  Chief Complaint  Patient presents with  . Follow-up    Malignant neoplasm of overlapping sites of right breast     [facility] OFFICE PROGRESS NOTE  Patient Care Team: Kirk Ruths, MD as PCP - General (Internal Medicine)  Breast cancer metastasized to bone [facility])   Staging form: Breast, AJCC 7th Edition     Clinical stage from 06/27/2013: Stage IV (T4, N2, M1) - Signed by [name], NP on 12/25/2014    Oncology History   1. RIGHT Breast cancer [Neglected] T4 N2 M1 ER positive HER-2 negative, stage IV disease of the right breast with metastases to thoracic spine, right chest wall, adrenal gland. Jan 2015- Letrozole + [name] 2016- [id number]]  2. July 2016- [facility] study-random Taxol; Progression- CEA ~200s/Imaging  3. April 2017- ERIBULIN; AUG 14th 2017- PROGRESSION right shouler girdle/ Left adernal mass  # Feb 17 2016- GEM x2 cycles [poor tol-fevers/ Increased LFTs]; OCT 2017- CT- stable/ improved left adrenal mass  # OCT 25th 2017- DOXIL q 4w; JAN 12th CT C/A/P- STABLE/mixed response; cont Doxil   # LYMPHEDEMA of RIGHT UE /Breast ulcerated lesion; AUG 2017 - [facility] PE- Eliquis     Breast cancer metastasized to bone ([place])   06/27/2013 Initial Diagnosis    Breast cancer metastasized to bone       Malignant neoplasm of overlapping sites of right breast ([name])   01/20/2016 Initial Diagnosis    Malignant neoplasm of overlapping sites of right breast [facility])       Carcinoma of overlapping sites of right breast in female, estrogen receptor positive ([name])   04/11/2016 Initial Diagnosis    Carcinoma of overlapping sites of right breast in female, estrogen receptor positive ([name])     INTERVAL  HISTORY:  A pleasant 61 year old morbidly obese Caucasian female patient with above history of metastatic breast cancer On Doxil is here for follow-up/to review the results of the CT scan.  Noted to have foul-smelling discharge/ from her right shoulder malignancy. Denies any bleeding from the breast mass. No nausea no vomiting. Appetite improved. No fever no chills.  Patient denies any unusual shortness of breath or cough. Denies any pain. Tingling and numbness in extremities- on Neurontin. Patient denies any nausea vomiting.   REVIEW OF SYSTEMS:  A complete 10 point review of system is done which is negative except mentioned above/history of present illness.   PAST MEDICAL HISTORY :  Past Medical History:  Diagnosis Date  . Anemia   . Anginal pain ([place])   . Breast cancer ([place])    2014   . Breast cancer metastasized to bone ([place]) 11/21/2014  . Hypertension   . Last menstrual period (LMP) > 10 days ago 2007  . Lymphedema of right upper extremity   . Myocardial infarction   . Post-menopausal 2006  . Sleep apnea   . Wound drainage     PAST SURGICAL HISTORY :   Past Surgical History:  Procedure Laterality Date  . CORONARY ANGIOPLASTY WITH STENT PLACEMENT N/A   . HERNIA REPAIR     umbilical hernia repair  . PORTACATH PLACEMENT Right 02/03/2015   Procedure: INSERTION PORT-A-CATH;  Surgeon: [name], MD;  Location: [facility] ORS;  Service: General;  Laterality: Right;  FAMILY HISTORY :   Family History  Problem Relation Age of Onset  . Atrial fibrillation Sister   . Hypertension Sister     SOCIAL HISTORY:   Social History  Substance Use Topics  . Smoking status: Former Smoker    Packs/day: 1.00    Types: Cigarettes    Quit date: 10/25/2004  . Smokeless tobacco: Never Used     Comment: stopped smoking greater than 7 years ago  . Alcohol use No    ALLERGIES:  is allergic to no known allergies.  MEDICATIONS:  Current Outpatient Prescriptions  Medication Sig  Dispense Refill  . acetaminophen (TYLENOL) 500 MG tablet Take 1,000 mg by mouth every 6 (six) hours as needed.    Marland Kitchen apixaban (ELIQUIS) 5 MG TABS tablet Take 1 tablet (5 mg total) by mouth 2 (two) times daily. 180 tablet 1  . [facility] CALCIUM 600+D 600-800 MG-UNIT TABS Take 1 tablet by mouth daily. At noon  3  . fexofenadine (ALLEGRA) 180 MG tablet Take 180 mg by mouth daily.     . furosemide (LASIX) 20 MG tablet Take 20 mg by mouth daily.  1  . gabapentin (NEURONTIN) 300 MG capsule Take 1 pill in AM; and 2 pills at PM. 90 capsule 3  . lisinopril (PRINIVIL,ZESTRIL) 20 MG tablet Take 20 mg by mouth daily.    . metoprolol succinate (TOPROL-XL) 50 MG 24 hr tablet Take 50 mg by mouth 2 (two) times daily. Take with or immediately following a meal.    . omega-3 acid ethyl esters (LOVAZA) 1 G capsule Take 1 g by mouth at bedtime.    . ondansetron (ZOFRAN) 8 MG tablet TAKE 1 TABLET (8MG) BY MOUTH TWICE DAILY START THE DAY AFTER CHEMOTHERAPY FOR 2 DAYS THEN TAKE AS NEEDED FOR NAUSEA OR VOMITING 30 tablet 0  . PHOSPHA 250 NEUTRAL 155-852-130 MG tablet TAKE 1 TABLET BY MOUTH TWICE DAILY 60 tablet 4  . potassium chloride (K-DUR) 10 MEQ tablet Take 10 mEq by mouth daily.   1  . simvastatin (ZOCOR) 80 MG tablet Take 80 mg by mouth at bedtime.    Marland Kitchen amoxicillin-clavulanate (AUGMENTIN) 875-125 MG tablet Take 1 tablet by mouth 2 (two) times daily. 30 tablet 0   No current facility-administered medications for this visit.    Facility-Administered Medications Ordered in Other Visits  Medication Dose Route Frequency Provider Last Rate Last Dose  . sodium chloride 0.9 % injection 10 mL  10 mL Intracatheter PRN Forest Gleason, MD   10 mL at 07/07/15 0911  . sodium chloride flush (NS) 0.9 % injection 10 mL  10 mL Intravenous PRN Forest Gleason, MD      . sodium chloride flush (NS) 0.9 % injection 10 mL  10 mL Intravenous PRN Cammie Sickle, MD   10 mL at 01/20/16 1320  . sodium chloride flush (NS) 0.9 % injection 10 mL   10 mL Intravenous PRN Cammie Sickle, MD   10 mL at 07/18/16 0935    PHYSICAL EXAMINATION: ECOG PERFORMANCE STATUS: 1 - Symptomatic but completely ambulatory  BP (!) 141/89 (BP Location: Left Arm, Patient Position: Sitting)   Pulse (!) 108   Temp 97.5 F (36.4 C) (Tympanic)   Wt (!) 305 lb (138.3 kg)   BMI 50.75 kg/m   Filed Weights   07/18/16 0956  Weight: (!) 305 lb (138.3 kg)    GENERAL: Well-nourished well-developed; Alert, no distress and comfortable.  She is Accompanied by her husband. She is a  wheelchair.. Morbidly obese. EYES: no pallor or icterus OROPHARYNX: no thrush or ulceration;  NECK: supple, no masses felt LYMPH:  no palpable lymphadenopathy in the cervical, axillary or inguinal regions LUNGS: clear to auscultation and  No wheeze or crackles HEART/CVS: regular rate & rhythm and no murmurs; chronic bilateral lower extremity edema. ABDOMEN:abdomen soft, non-tender and normal bowel sounds Musculoskeletal:no cyanosis of digits and no clubbing; right upper extremity lymphedema noted. PSYCH: alert & oriented x 3 with fluent speech NEURO: no focal motor/sensory deficits SKIN:   Right breast ulcerated/ scabbing lesion noted; no drainage. Right anterior [4cm] shoulder; shoulder lesions [ 4x4 cm in size] noted.Fall spent in discharge noted from the right shoulder posterior ulcerated mass.  LABORATORY DATA:  I have reviewed the data as listed    Component Value Date/Time   NA 138 07/18/2016 0933   NA 142 08/01/2014 1435   K 3.9 07/18/2016 0933   K 3.5 08/01/2014 1435   CL 107 07/18/2016 0933   CL 103 08/01/2014 1435   CO2 24 07/18/2016 0933   CO2 27 08/01/2014 1435   GLUCOSE 163 (H) 07/18/2016 0933   GLUCOSE 148 (H) 08/01/2014 1435   BUN 16 07/18/2016 0933   BUN 13 08/01/2014 1435   CREATININE 0.73 07/18/2016 0933   CREATININE 0.71 10/02/2014 0930   CALCIUM 9.0 07/18/2016 0933   CALCIUM 8.6 (L) 10/02/2014 0930   PROT 7.6 07/18/2016 0933   PROT 7.6  10/02/2014 0930   ALBUMIN 3.3 (L) 07/18/2016 0933   ALBUMIN 3.7 10/02/2014 0930   AST 22 07/18/2016 0933   AST 21 10/02/2014 0930   ALT 12 (L) 07/18/2016 0933   ALT 23 10/02/2014 0930   ALKPHOS 65 07/18/2016 0933   ALKPHOS 41 10/02/2014 0930   BILITOT 0.5 07/18/2016 0933   BILITOT 0.5 10/02/2014 0930   GFRNONAA >60 07/18/2016 0933   GFRNONAA >60 10/02/2014 0930   GFRAA >60 07/18/2016 0933   GFRAA >60 10/02/2014 0930    No results found for: SPEP, UPEP  Lab Results  Component Value Date   WBC 8.8 07/18/2016   NEUTROABS 6.7 (H) 07/18/2016   HGB 12.3 07/18/2016   HCT 37.3 07/18/2016   MCV 86.1 07/18/2016   PLT 302 07/18/2016      Chemistry      Component Value Date/Time   NA 138 07/18/2016 0933   NA 142 08/01/2014 1435   K 3.9 07/18/2016 0933   K 3.5 08/01/2014 1435   CL 107 07/18/2016 0933   CL 103 08/01/2014 1435   CO2 24 07/18/2016 0933   CO2 27 08/01/2014 1435   BUN 16 07/18/2016 0933   BUN 13 08/01/2014 1435   CREATININE 0.73 07/18/2016 0933   CREATININE 0.71 10/02/2014 0930      Component Value Date/Time   CALCIUM 9.0 07/18/2016 0933   CALCIUM 8.6 (L) 10/02/2014 0930   ALKPHOS 65 07/18/2016 0933   ALKPHOS 41 10/02/2014 0930   AST 22 07/18/2016 0933   AST 21 10/02/2014 0930   ALT 12 (L) 07/18/2016 0933   ALT 23 10/02/2014 0930   BILITOT 0.5 07/18/2016 0933   BILITOT 0.5 10/02/2014 0930      IMPRESSION: 1. In general, the metastatic lesions shown on the prior exam are stable with the exception of the left adrenal metastatic lesion which is reduced in size. 2. 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 3. Extensive atherosclerosis and mild cardiomegaly. 4. There  is some vague indistinctness of the anterior cortical margin of the left iliac bone. There could be some tumor eroding this bone all, along the left iliacus muscle, but I am not certain. There is enough  artifact that this is questionable. There is also questionable lesions in the left upper sacrum, along with a small amount of presacral edema.  RADIOGRAPHIC STUDIES: I have personally reviewed the radiological images as listed and agreed with the findings in the report. No results found.    ASSESSMENT & PLAN:  Carcinoma of overlapping sites of right breast in female, estrogen receptor positive ([place]) Metastatic breast cancer- Aug 2017- CAT scan shows progressive disease in the right shoulder/underarm; and also the adrenal lesion. Currently in Doxil every 28 days; s/p cycle #2.  CT C/A/P- Mixed response;  Continue Doxil.   # proceed with cycle # 4 of Doxil today.;labs okay.   # LFTs- AST ALT I improved.   # PE- Moderate- based on CT imaging incidental. Patient is asymptomatic. Patient is on Eliquis.   # Lymphedema RIght UE- Secondary to malignancy; slightly worse; hold off lymphedema referral given possible infection.   # Right shoulder wound/ necrotic ulcer- recommend Augumentin bid x15days.   # Follow up in 4 weeks/labs/Ca 27-29; CEA.   # # I reviewed the blood work- with the patient in detail; also reviewed the imaging independently [as summarized above]; and with the patient in detail.  I discussed with the patient and husband that I'm disappointed with the results that he does not show any significant improvement of the shoulder lesions.  Orders Placed This Encounter  Procedures  . CEA    Standing Status:   Future    Standing Expiration Date:   07/18/2017  . Cancer antigen 27.29    Standing Status:   Future    Standing Expiration Date:   07/18/2017  . CBC with Differential    Standing Status:   Standing    Number of Occurrences:   20    Standing Expiration Date:   07/18/2017  . Comprehensive metabolic panel    Standing Status:   Standing    Number of Occurrences:   20    Standing Expiration Date:   07/18/2017  . AMB referral to wound care center    Referral Priority:    Routine    Referral Type:   Consultation    Number of Visits Requested:   1   All questions were answered. The patient knows to call the clinic with any problems, questions or concerns.      Cammie Sickle, MD 07/18/2016 1:43 PMp  Review of Systems  All other systems reviewed and are negative.          07/18/2016

## 2016-07-18 NOTE — Assessment & Plan Note (Addendum)
Metastatic breast cancer- Aug 2017- CAT scan shows progressive disease in the right shoulder/underarm; and also the adrenal lesion. Currently in Doxil every 28 days; s/p cycle #2.  CT C/A/P- Mixed response;  Continue Doxil.   # proceed with cycle # 4 of Doxil today.;labs okay.   # LFTs- AST ALT I improved.   # PE- Moderate- based on CT imaging incidental. Patient is asymptomatic. Patient is on Eliquis.   # Lymphedema RIght UE- Secondary to malignancy; slightly worse; hold off lymphedema referral given possible infection.   # Right shoulder wound/ necrotic ulcer- recommend Augumentin bid x15days.   # Follow up in 4 weeks/labs/Ca 27-29; CEA.   # # I reviewed the blood work- with the patient in detail; also reviewed the imaging independently [as summarized above]; and with the patient in detail.  I discussed with the patient and husband that I'm disappointed with the results that he does not show any significant improvement of the shoulder lesions.

## 2016-07-18 NOTE — Progress Notes (Signed)
Patient here today for follow up.  Patient states no new concerns today  

## 2016-08-11 ENCOUNTER — Telehealth: Payer: Self-pay | Admitting: *Deleted

## 2016-08-11 MED ORDER — OSELTAMIVIR PHOSPHATE 75 MG PO CAPS: 75.0000 mg | ORAL_CAPSULE | Freq: Every day | ORAL | 0 refills | Status: DC

## 2016-08-11 NOTE — Telephone Encounter (Signed)
Per VO Dr Rogue Bussing, Tamiflu 75 mg daily times 7 days. Patient informed and asked it be [facility] [place]

## 2016-08-11 NOTE — Telephone Encounter (Signed)
Called to report that she has been exposed to the flu. She does not have any symptoms, but is asking if she should be treated with Tamiflu. Please advise

## 2016-08-15 ENCOUNTER — Inpatient Hospital Stay: Payer: BC Managed Care – PPO

## 2016-08-15 ENCOUNTER — Inpatient Hospital Stay: Payer: BC Managed Care – PPO | Attending: Internal Medicine | Admitting: Internal Medicine

## 2016-08-15 VITALS — BP 141/83 | HR 102 | Temp 98.2°F | Wt 309.2 lb

## 2016-08-15 DIAGNOSIS — Z86711 Personal history of pulmonary embolism: Secondary | ICD-10-CM

## 2016-08-15 DIAGNOSIS — C7951 Secondary malignant neoplasm of bone: Secondary | ICD-10-CM

## 2016-08-15 DIAGNOSIS — E669 Obesity, unspecified: Secondary | ICD-10-CM | POA: Diagnosis not present

## 2016-08-15 DIAGNOSIS — Z7901 Long term (current) use of anticoagulants: Secondary | ICD-10-CM | POA: Diagnosis not present

## 2016-08-15 DIAGNOSIS — I252 Old myocardial infarction: Secondary | ICD-10-CM | POA: Diagnosis not present

## 2016-08-15 DIAGNOSIS — I890 Lymphedema, not elsewhere classified: Secondary | ICD-10-CM | POA: Diagnosis not present

## 2016-08-15 DIAGNOSIS — C50811 Malignant neoplasm of overlapping sites of right female breast: Secondary | ICD-10-CM | POA: Diagnosis not present

## 2016-08-15 DIAGNOSIS — Z170 Estrogen receptor positive status [ER+]: Principal | ICD-10-CM

## 2016-08-15 DIAGNOSIS — I10 Essential (primary) hypertension: Secondary | ICD-10-CM

## 2016-08-15 DIAGNOSIS — Z79899 Other long term (current) drug therapy: Secondary | ICD-10-CM | POA: Diagnosis not present

## 2016-08-15 DIAGNOSIS — F419 Anxiety disorder, unspecified: Secondary | ICD-10-CM

## 2016-08-15 DIAGNOSIS — C7972 Secondary malignant neoplasm of left adrenal gland: Secondary | ICD-10-CM

## 2016-08-15 DIAGNOSIS — L98499 Non-pressure chronic ulcer of skin of other sites with unspecified severity: Secondary | ICD-10-CM

## 2016-08-15 DIAGNOSIS — D649 Anemia, unspecified: Secondary | ICD-10-CM

## 2016-08-15 DIAGNOSIS — I2510 Atherosclerotic heart disease of native coronary artery without angina pectoris: Secondary | ICD-10-CM

## 2016-08-15 DIAGNOSIS — I517 Cardiomegaly: Secondary | ICD-10-CM | POA: Diagnosis not present

## 2016-08-15 DIAGNOSIS — G4700 Insomnia, unspecified: Secondary | ICD-10-CM

## 2016-08-15 DIAGNOSIS — Z87891 Personal history of nicotine dependence: Secondary | ICD-10-CM | POA: Diagnosis not present

## 2016-08-15 DIAGNOSIS — R079 Chest pain, unspecified: Secondary | ICD-10-CM

## 2016-08-15 DIAGNOSIS — Z5111 Encounter for antineoplastic chemotherapy: Secondary | ICD-10-CM | POA: Diagnosis not present

## 2016-08-15 DIAGNOSIS — G4730 Sleep apnea, unspecified: Secondary | ICD-10-CM

## 2016-08-15 LAB — CBC WITH DIFFERENTIAL/PLATELET
Basophils Absolute: 0.1 10*3/uL (ref 0–0.1)
Basophils Relative: 1 %
Eosinophils Absolute: 0.1 10*3/uL (ref 0–0.7)
Eosinophils Relative: 2 %
HCT: 35.2 % (ref 35.0–47.0)
HEMOGLOBIN: 11.7 g/dL — AB (ref 12.0–16.0)
LYMPHS ABS: 1.1 10*3/uL (ref 1.0–3.6)
LYMPHS PCT: 15 %
MCH: 28.7 pg (ref 26.0–34.0)
MCHC: 33.3 g/dL (ref 32.0–36.0)
MCV: 86.2 fL (ref 80.0–100.0)
MONO ABS: 0.7 10*3/uL (ref 0.2–0.9)
MONOS PCT: 11 %
NEUTROS ABS: 5 10*3/uL (ref 1.4–6.5)
NEUTROS PCT: 71 %
Platelets: 349 10*3/uL (ref 150–440)
RBC: 4.08 MIL/uL (ref 3.80–5.20)
RDW: 20.7 % — ABNORMAL HIGH (ref 11.5–14.5)
WBC: 7 10*3/uL (ref 3.6–11.0)

## 2016-08-15 LAB — COMPREHENSIVE METABOLIC PANEL
ALBUMIN: 3.1 g/dL — AB (ref 3.5–5.0)
ALK PHOS: 64 U/L (ref 38–126)
ALT: 12 U/L — ABNORMAL LOW (ref 14–54)
ANION GAP: 9 (ref 5–15)
AST: 21 U/L (ref 15–41)
BUN: 15 mg/dL (ref 6–20)
CALCIUM: 8.9 mg/dL (ref 8.9–10.3)
CO2: 21 mmol/L — ABNORMAL LOW (ref 22–32)
Chloride: 109 mmol/L (ref 101–111)
Creatinine, Ser: 0.67 mg/dL (ref 0.44–1.00)
GFR calc Af Amer: >60 mL/min (ref >60)
GLUCOSE: 168 mg/dL — AB (ref 65–99)
Potassium: 3.8 mmol/L (ref 3.5–5.1)
Sodium: 139 mmol/L (ref 135–145)
TOTAL PROTEIN: 7.5 g/dL (ref 6.5–8.1)
Total Bilirubin: 0.5 mg/dL (ref 0.3–1.2)

## 2016-08-15 MED ORDER — SODIUM CHLORIDE 0.9% FLUSH
10.0000 mL | INTRAVENOUS | Status: DC | PRN
  Administered 2016-08-15: 10 mL
  Filled 2016-08-15: qty 10

## 2016-08-15 MED ORDER — HEPARIN SOD (PORK) LOCK FLUSH 100 UNIT/ML IV SOLN
500.0000 [IU] | Freq: Once | INTRAVENOUS | Status: AC | PRN
  Administered 2016-08-15: 500 [IU]
  Filled 2016-08-15: qty 5

## 2016-08-15 MED ORDER — AMOXICILLIN-POT CLAVULANATE 875-125 MG PO TABS: 1.0000 | ORAL_TABLET | Freq: Two times a day (BID) | ORAL | 0 refills | Status: DC

## 2016-08-15 MED ORDER — DOXORUBICIN HCL LIPOSOMAL CHEMO INJECTION 2 MG/ML
45.0000 mg/m2 | Freq: Once | INTRAVENOUS | Status: AC
  Administered 2016-08-15: 114 mg via INTRAVENOUS
  Filled 2016-08-15: qty 10

## 2016-08-15 MED ORDER — SODIUM CHLORIDE 0.9 % IV SOLN: 10.0000 mg | Freq: Once | INTRAVENOUS | Status: DC

## 2016-08-15 MED ORDER — DEXTROSE 5 % IV SOLN
Freq: Once | INTRAVENOUS | Status: AC
  Administered 2016-08-15: 11:00:00 via INTRAVENOUS
  Filled 2016-08-15: qty 1000

## 2016-08-15 MED ORDER — K PHOS MONO-SOD PHOS DI & MONO 155-852-130 MG PO TABS: 250.0000 mg | ORAL_TABLET | Freq: Two times a day (BID) | ORAL | 1 refills | Status: AC

## 2016-08-15 MED ORDER — DEXAMETHASONE SODIUM PHOSPHATE 10 MG/ML IJ SOLN
10.0000 mg | Freq: Once | INTRAMUSCULAR | Status: AC
  Administered 2016-08-15: 10 mg via INTRAVENOUS
  Filled 2016-08-15: qty 1

## 2016-08-15 NOTE — Progress Notes (Signed)
[name]  [phone]  [phone]) [phone]     [name] DOB: Oct 29, 1955  MR#: [id number]  SPQ#:[id number]  Patient Care Team: Kirk Ruths, MD as PCP - General (Internal Medicine)  CHIEF COMPLAINT:  Chief Complaint  Patient presents with  . Follow-up    Carcinoma of overlapping sites of right breast in female, estrogen receptor positive    [place] OFFICE PROGRESS NOTE  Patient Care Team: Kirk Ruths, MD as PCP - General (Internal Medicine)  Breast cancer metastasized to bone [facility])   Staging form: Breast, AJCC 7th Edition     Clinical stage from 06/27/2013: Stage IV (T4, N2, M1) - Signed by [name], NP on 12/25/2014    Oncology History   1. RIGHT Breast cancer [Neglected] T4 N2 M1 ER positive HER-2 negative, stage IV disease of the right breast with metastases to thoracic spine, right chest wall, adrenal gland. Jan 2015- Letrozole + [name] 2016- CEA-223]  2. July 2016- [facility] study-random Taxol; Progression- CEA ~200s/Imaging  3. April 2017- ERIBULIN; AUG 14th 2017- PROGRESSION right shouler girdle/ Left adernal mass  # Feb 17 2016- GEM x2 cycles [poor tol-fevers/ Increased LFTs]; OCT 2017- CT- stable/ improved left adrenal mass  # OCT 25th 2017- DOXIL q 4w; JAN 12th CT C/A/P- STABLE/mixed response; cont Doxil   # LYMPHEDEMA of RIGHT UE /Breast ulcerated lesion; AUG 2017 - [facility] PE- Eliquis     Breast cancer metastasized to bone ([place])   06/27/2013 Initial Diagnosis    Breast cancer metastasized to bone       Carcinoma of overlapping sites of right breast in female, estrogen receptor positive ([place])   04/11/2016 Initial Diagnosis    Carcinoma of overlapping sites of right breast in female, estrogen receptor positive ([place])     INTERVAL HISTORY:  A pleasant 61 year old morbidly obese Caucasian female patient with above history of metastatic breast cancer On Doxil is here for  follow-up.  Patient was recently exposed patient's with flu [daughter and grandson]. Patient was started on prophylactic Tamiflu.  Patient noted to have improvement of the necrotic discharge from her right shoulder ulcerated mass after using antibiotic. However more recently she noted to have increasing distress. No nausea no vomiting. Appetite improved. No fever no chills. She complains of difficulty sleeping at night. Denies any significant anxiety or pain.  Patient denies any unusual shortness of breath or cough. Denies any pain. Tingling and numbness in extremities- on Neurontin.  REVIEW OF SYSTEMS:  A complete 10 point review of system is done which is negative except mentioned above/history of present illness.   PAST MEDICAL HISTORY :  Past Medical History:  Diagnosis Date  . Anemia   . Anginal pain ([place])   . Breast cancer ([place])    2014   . Breast cancer metastasized to bone ([place]) 11/21/2014  . Hypertension   . Last menstrual period (LMP) > 10 days ago 2007  . Lymphedema of right upper extremity   . Myocardial infarction   . Post-menopausal 2006  . Sleep apnea   . Wound drainage     PAST SURGICAL HISTORY :   Past Surgical History:  Procedure Laterality Date  . CORONARY ANGIOPLASTY WITH STENT PLACEMENT N/A   . HERNIA REPAIR     umbilical hernia repair  . PORTACATH PLACEMENT Right 02/03/2015   Procedure: INSERTION PORT-A-CATH;  Surgeon: [name], MD;  Location: [facility] ORS;  Service: General;  Laterality: Right;  FAMILY HISTORY :   Family History  Problem Relation Age of Onset  . Atrial fibrillation Sister   . Hypertension Sister     SOCIAL HISTORY:   Social History  Substance Use Topics  . Smoking status: Former Smoker    Packs/day: 1.00    Types: Cigarettes    Quit date: 10/25/2004  . Smokeless tobacco: Never Used     Comment: stopped smoking greater than 7 years ago  . Alcohol use No    ALLERGIES:  is allergic to no known allergies.  MEDICATIONS:    Current Outpatient Prescriptions  Medication Sig Dispense Refill  . acetaminophen (TYLENOL) 500 MG tablet Take 1,000 mg by mouth every 6 (six) hours as needed.    Marland Kitchen apixaban (ELIQUIS) 5 MG TABS tablet Take 1 tablet (5 mg total) by mouth 2 (two) times daily. 180 tablet 1  . [employer] CALCIUM 600+D 600-800 MG-UNIT TABS Take 1 tablet by mouth daily. At noon  3  . fexofenadine (ALLEGRA) 180 MG tablet Take 180 mg by mouth daily.     . furosemide (LASIX) 20 MG tablet Take 20 mg by mouth daily.  1  . gabapentin (NEURONTIN) 300 MG capsule Take 1 pill in AM; and 2 pills at PM. 90 capsule 3  . lisinopril (PRINIVIL,ZESTRIL) 20 MG tablet Take 20 mg by mouth daily.    . metoprolol succinate (TOPROL-XL) 50 MG 24 hr tablet Take 50 mg by mouth 2 (two) times daily. Take with or immediately following a meal.    . omega-3 acid ethyl esters (LOVAZA) 1 G capsule Take 1 g by mouth at bedtime.    . ondansetron (ZOFRAN) 8 MG tablet TAKE 1 TABLET (8MG) BY MOUTH TWICE DAILY START THE DAY AFTER CHEMOTHERAPY FOR 2 DAYS THEN TAKE AS NEEDED FOR NAUSEA OR VOMITING 30 tablet 0  . oseltamivir (TAMIFLU) 75 MG capsule Take 1 capsule (75 mg total) by mouth daily. 7 capsule 0  . phosphorus (PHOSPHA 250 NEUTRAL) 155-852-130 MG tablet Take 1 tablet (250 mg total) by mouth 2 (two) times daily. 180 tablet 1  . potassium chloride (K-DUR) 10 MEQ tablet Take 10 mEq by mouth daily.   1  . simvastatin (ZOCOR) 80 MG tablet Take 80 mg by mouth at bedtime.    Marland Kitchen amoxicillin-clavulanate (AUGMENTIN) 875-125 MG tablet Take 1 tablet by mouth 2 (two) times daily. 30 tablet 0   No current facility-administered medications for this visit.    Facility-Administered Medications Ordered in Other Visits  Medication Dose Route Frequency Provider Last Rate Last Dose  . sodium chloride 0.9 % injection 10 mL  10 mL Intracatheter PRN Forest Gleason, MD   10 mL at 07/07/15 0911  . sodium chloride flush (NS) 0.9 % injection 10 mL  10 mL Intravenous PRN Forest Gleason,  MD      . sodium chloride flush (NS) 0.9 % injection 10 mL  10 mL Intravenous PRN Cammie Sickle, MD   10 mL at 01/20/16 1320  . sodium chloride flush (NS) 0.9 % injection 10 mL  10 mL Intracatheter PRN Cammie Sickle, MD   10 mL at 08/15/16 0930    PHYSICAL EXAMINATION: ECOG PERFORMANCE STATUS: 1 - Symptomatic but completely ambulatory  BP (!) 141/83 (BP Location: Left Arm, Patient Position: Sitting)   Pulse (!) 102   Temp 98.2 F (36.8 C) (Tympanic)   Wt (!) 309 lb 4 oz (140.3 kg)   BMI 51.46 kg/m   Filed Weights   08/15/16 5465  Weight: (!) 309 lb 4 oz (140.3 kg)    GENERAL: Well-nourished well-developed; Alert, no distress and comfortable.  She is Accompanied by her husband. She is a wheelchair.. Morbidly obese. EYES: no pallor or icterus OROPHARYNX: no thrush or ulceration;  NECK: supple, no masses felt LYMPH:  no palpable lymphadenopathy in the cervical, axillary or inguinal regions LUNGS: clear to auscultation and  No wheeze or crackles HEART/CVS: regular rate & rhythm and no murmurs; chronic bilateral lower extremity edema. ABDOMEN:abdomen soft, non-tender and normal bowel sounds Musculoskeletal:no cyanosis of digits and no clubbing; right upper extremity lymphedema noted. PSYCH: alert & oriented x 3 with fluent speech NEURO: no focal motor/sensory deficits SKIN:   Right breast ulcerated/ scabbing lesion noted; no drainage. Right anterior [4cm] shoulder; shoulder lesions [ 4x4 cm in size] noted.Foul smelling in discharge noted from the right shoulder posterior ulcerated mass.  LABORATORY DATA:  I have reviewed the data as listed    Component Value Date/Time   NA 139 08/15/2016 0912   NA 142 08/01/2014 1435   K 3.8 08/15/2016 0912   K 3.5 08/01/2014 1435   CL 109 08/15/2016 0912   CL 103 08/01/2014 1435   CO2 21 (L) 08/15/2016 0912   CO2 27 08/01/2014 1435   GLUCOSE 168 (H) 08/15/2016 0912   GLUCOSE 148 (H) 08/01/2014 1435   BUN 15 08/15/2016 0912    BUN 13 08/01/2014 1435   CREATININE 0.67 08/15/2016 0912   CREATININE 0.71 10/02/2014 0930   CALCIUM 8.9 08/15/2016 0912   CALCIUM 8.6 (L) 10/02/2014 0930   PROT 7.5 08/15/2016 0912   PROT 7.6 10/02/2014 0930   ALBUMIN 3.1 (L) 08/15/2016 0912   ALBUMIN 3.7 10/02/2014 0930   AST 21 08/15/2016 0912   AST 21 10/02/2014 0930   ALT 12 (L) 08/15/2016 0912   ALT 23 10/02/2014 0930   ALKPHOS 64 08/15/2016 0912   ALKPHOS 41 10/02/2014 0930   BILITOT 0.5 08/15/2016 0912   BILITOT 0.5 10/02/2014 0930   GFRNONAA >60 08/15/2016 0912   GFRNONAA >60 10/02/2014 0930   GFRAA >60 08/15/2016 0912   GFRAA >60 10/02/2014 0930    No results found for: SPEP, UPEP  Lab Results  Component Value Date   WBC 7.0 08/15/2016   NEUTROABS 5.0 08/15/2016   HGB 11.7 (L) 08/15/2016   HCT 35.2 08/15/2016   MCV 86.2 08/15/2016   PLT 349 08/15/2016      Chemistry      Component Value Date/Time   NA 139 08/15/2016 0912   NA 142 08/01/2014 1435   K 3.8 08/15/2016 0912   K 3.5 08/01/2014 1435   CL 109 08/15/2016 0912   CL 103 08/01/2014 1435   CO2 21 (L) 08/15/2016 0912   CO2 27 08/01/2014 1435   BUN 15 08/15/2016 0912   BUN 13 08/01/2014 1435   CREATININE 0.67 08/15/2016 0912   CREATININE 0.71 10/02/2014 0930      Component Value Date/Time   CALCIUM 8.9 08/15/2016 0912   CALCIUM 8.6 (L) 10/02/2014 0930   ALKPHOS 64 08/15/2016 0912   ALKPHOS 41 10/02/2014 0930   AST 21 08/15/2016 0912   AST 21 10/02/2014 0930   ALT 12 (L) 08/15/2016 0912   ALT 23 10/02/2014 0930   BILITOT 0.5 08/15/2016 0912   BILITOT 0.5 10/02/2014 0930      IMPRESSION: 1. In general, the metastatic lesions shown on the prior exam are stable with the exception of the left adrenal metastatic lesion which is  reduced in size. 2. 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 3. Extensive atherosclerosis and mild cardiomegaly. 4. There is some  vague indistinctness of the anterior cortical margin of the left iliac bone. There could be some tumor eroding this bone all, along the left iliacus muscle, but I am not certain. There is enough artifact that this is questionable. There is also questionable lesions in the left upper sacrum, along with a small amount of presacral edema.  RADIOGRAPHIC STUDIES: I have personally reviewed the radiological images as listed and agreed with the findings in the report. No results found.    ASSESSMENT & PLAN:  Carcinoma of overlapping sites of right breast in female, estrogen receptor positive ([place]) Metastatic breast cancer- Aug 2017- CAT scan shows progressive disease in the right shoulder/underarm; and also the adrenal lesion. Currently in Doxil every 28 days; s/p cycle #4- Jan 12th 2018- CT C/A/P- Mixed response;  Continue Doxil.   # proceed with cycle # 5 of Doxil today. Labs today reviewed;  acceptable for treatment today.   # PE- Moderate- based on CT imaging incidental. Patient is asymptomatic. Patient is on Eliquis.   # Lymphedema RIght UE- Secondary to malignancy; slightly worse; hold off lymphedema referral given possible infection.   # Right shoulder wound/ necrotic ulcer- improved; not resolved; proceed with 2 more week  Augumentin bid x15days. If not improved would recommend evaluation with Dr. Donella Stade for possible radiation future.  # Insomnia/ anxiety- try melatonin qhs; if not benadryl.  # Follow up in 4 weeks/labs/Ca 27-29; CEA.   Orders Placed This Encounter  Procedures  . Cancer antigen 27.29    Standing Status:   Standing    Number of Occurrences:   20    Standing Expiration Date:   08/15/2017  . CEA    Standing Status:   Standing    Number of Occurrences:   20    Standing Expiration Date:   08/15/2017   All questions were answered. The patient knows to call the clinic with any problems, questions or concerns.      Cammie Sickle, MD 08/15/2016 4:57  PMp  Review of Systems  All other systems reviewed and are negative.          08/15/2016

## 2016-08-15 NOTE — Progress Notes (Signed)
Patient here today for follow up.  Patient c/o neuropathy in right hand that is waking her at night

## 2016-08-15 NOTE — Assessment & Plan Note (Addendum)
Metastatic breast cancer- Aug 2017- CAT scan shows progressive disease in the right shoulder/underarm; and also the adrenal lesion. Currently in Doxil every 28 days; s/p cycle #4- Jan 12th 2018- CT C/A/P- Mixed response;  Continue Doxil.   # proceed with cycle # 5 of Doxil today. Labs today reviewed;  acceptable for treatment today.   # PE- Moderate- based on CT imaging incidental. Patient is asymptomatic. Patient is on Eliquis.   # Lymphedema RIght UE- Secondary to malignancy; slightly worse; hold off lymphedema referral given possible infection.   # Right shoulder wound/ necrotic ulcer- improved; not resolved; proceed with 2 more week  Augumentin bid x15days. If not improved would recommend evaluation with Dr. Donella Stade for possible radiation future.  # Insomnia/ anxiety- try melatonin qhs; if not benadryl.  # Follow up in 4 weeks/labs/Ca 27-29; CEA.

## 2016-08-16 LAB — CANCER ANTIGEN 27.29: CA 27.29: 42.6 U/mL — AB (ref 0.0–38.6)

## 2016-08-16 LAB — CEA: CEA: 151.8 ng/mL — AB (ref 0.0–4.7)

## 2016-09-06 ENCOUNTER — Other Ambulatory Visit: Payer: Self-pay | Admitting: Internal Medicine

## 2016-09-06 DIAGNOSIS — C50811 Malignant neoplasm of overlapping sites of right female breast: Secondary | ICD-10-CM

## 2016-09-06 DIAGNOSIS — R970 Elevated carcinoembryonic antigen [CEA]: Secondary | ICD-10-CM

## 2016-09-12 ENCOUNTER — Inpatient Hospital Stay: Payer: BC Managed Care – PPO | Attending: Internal Medicine

## 2016-09-12 ENCOUNTER — Inpatient Hospital Stay (HOSPITAL_BASED_OUTPATIENT_CLINIC_OR_DEPARTMENT_OTHER): Payer: BC Managed Care – PPO | Admitting: Internal Medicine

## 2016-09-12 ENCOUNTER — Telehealth: Payer: Self-pay | Admitting: Internal Medicine

## 2016-09-12 ENCOUNTER — Inpatient Hospital Stay: Payer: BC Managed Care – PPO

## 2016-09-12 ENCOUNTER — Ambulatory Visit
Admission: RE | Admit: 2016-09-12 | Discharge: 2016-09-12 | Disposition: A | Discharge status: Home / Self Care | Payer: BC Managed Care – PPO | Source: Ambulatory Visit | Attending: Radiation Oncology | Admitting: Radiation Oncology

## 2016-09-12 VITALS — BP 118/77 | HR 98 | Temp 96.9°F | Resp 18 | Wt 310.0 lb

## 2016-09-12 DIAGNOSIS — C50411 Malignant neoplasm of upper-outer quadrant of right female breast: Secondary | ICD-10-CM | POA: Insufficient documentation

## 2016-09-12 DIAGNOSIS — Z510 Encounter for antineoplastic radiation therapy: Secondary | ICD-10-CM | POA: Insufficient documentation

## 2016-09-12 DIAGNOSIS — L98498 Non-pressure chronic ulcer of skin of other sites with other specified severity: Secondary | ICD-10-CM

## 2016-09-12 DIAGNOSIS — G4730 Sleep apnea, unspecified: Secondary | ICD-10-CM | POA: Diagnosis not present

## 2016-09-12 DIAGNOSIS — Z95828 Presence of other vascular implants and grafts: Secondary | ICD-10-CM | POA: Insufficient documentation

## 2016-09-12 DIAGNOSIS — I890 Lymphedema, not elsewhere classified: Secondary | ICD-10-CM | POA: Insufficient documentation

## 2016-09-12 DIAGNOSIS — Z170 Estrogen receptor positive status [ER+]: Secondary | ICD-10-CM

## 2016-09-12 DIAGNOSIS — C7951 Secondary malignant neoplasm of bone: Secondary | ICD-10-CM | POA: Insufficient documentation

## 2016-09-12 DIAGNOSIS — Z79899 Other long term (current) drug therapy: Secondary | ICD-10-CM | POA: Diagnosis not present

## 2016-09-12 DIAGNOSIS — C50811 Malignant neoplasm of overlapping sites of right female breast: Secondary | ICD-10-CM | POA: Insufficient documentation

## 2016-09-12 DIAGNOSIS — C7970 Secondary malignant neoplasm of unspecified adrenal gland: Secondary | ICD-10-CM | POA: Insufficient documentation

## 2016-09-12 DIAGNOSIS — Z87891 Personal history of nicotine dependence: Secondary | ICD-10-CM | POA: Insufficient documentation

## 2016-09-12 DIAGNOSIS — Z7901 Long term (current) use of anticoagulants: Secondary | ICD-10-CM | POA: Insufficient documentation

## 2016-09-12 DIAGNOSIS — E669 Obesity, unspecified: Secondary | ICD-10-CM | POA: Diagnosis not present

## 2016-09-12 DIAGNOSIS — I4891 Unspecified atrial fibrillation: Secondary | ICD-10-CM

## 2016-09-12 DIAGNOSIS — I252 Old myocardial infarction: Secondary | ICD-10-CM | POA: Insufficient documentation

## 2016-09-12 DIAGNOSIS — D649 Anemia, unspecified: Secondary | ICD-10-CM | POA: Insufficient documentation

## 2016-09-12 DIAGNOSIS — I10 Essential (primary) hypertension: Secondary | ICD-10-CM | POA: Insufficient documentation

## 2016-09-12 DIAGNOSIS — I209 Angina pectoris, unspecified: Secondary | ICD-10-CM | POA: Insufficient documentation

## 2016-09-12 DIAGNOSIS — Z79811 Long term (current) use of aromatase inhibitors: Secondary | ICD-10-CM | POA: Insufficient documentation

## 2016-09-12 DIAGNOSIS — C50911 Malignant neoplasm of unspecified site of right female breast: Secondary | ICD-10-CM

## 2016-09-12 LAB — COMPREHENSIVE METABOLIC PANEL
ALK PHOS: 61 U/L (ref 38–126)
ALT: 10 U/L — AB (ref 14–54)
AST: 20 U/L (ref 15–41)
Albumin: 3 g/dL — ABNORMAL LOW (ref 3.5–5.0)
Anion gap: 8 (ref 5–15)
BUN: 13 mg/dL (ref 6–20)
CALCIUM: 8.8 mg/dL — AB (ref 8.9–10.3)
CHLORIDE: 107 mmol/L (ref 101–111)
CO2: 23 mmol/L (ref 22–32)
Creatinine, Ser: 0.73 mg/dL (ref 0.44–1.00)
GFR calc Af Amer: >60 mL/min (ref >60)
Glucose, Bld: 182 mg/dL — ABNORMAL HIGH (ref 65–99)
Potassium: 4 mmol/L (ref 3.5–5.1)
Sodium: 138 mmol/L (ref 135–145)
Total Bilirubin: 0.4 mg/dL (ref 0.3–1.2)
Total Protein: 7.2 g/dL (ref 6.5–8.1)

## 2016-09-12 LAB — CBC WITH DIFFERENTIAL/PLATELET
Basophils Absolute: 0.1 10*3/uL (ref 0–0.1)
Basophils Relative: 1 %
EOS PCT: 1 %
Eosinophils Absolute: 0.1 10*3/uL (ref 0–0.7)
HCT: 33.7 % — ABNORMAL LOW (ref 35.0–47.0)
HEMOGLOBIN: 11.4 g/dL — AB (ref 12.0–16.0)
LYMPHS ABS: 1.1 10*3/uL (ref 1.0–3.6)
LYMPHS PCT: 11 %
MCH: 29.3 pg (ref 26.0–34.0)
MCHC: 33.9 g/dL (ref 32.0–36.0)
MCV: 86.5 fL (ref 80.0–100.0)
Monocytes Absolute: 1.1 10*3/uL — ABNORMAL HIGH (ref 0.2–0.9)
Monocytes Relative: 11 %
NEUTROS ABS: 7.3 10*3/uL — AB (ref 1.4–6.5)
Neutrophils Relative %: 76 %
PLATELETS: 342 10*3/uL (ref 150–440)
RBC: 3.9 MIL/uL (ref 3.80–5.20)
RDW: 20.9 % — ABNORMAL HIGH (ref 11.5–14.5)
WBC: 9.6 10*3/uL (ref 3.6–11.0)

## 2016-09-12 MED ORDER — SODIUM CHLORIDE 0.9% FLUSH
10.0000 mL | INTRAVENOUS | Status: DC | PRN
  Administered 2016-09-12: 10 mL via INTRAVENOUS
  Filled 2016-09-12: qty 10

## 2016-09-12 MED ORDER — ABEMACICLIB 150 MG PO TABS: 150.0000 mg | ORAL_TABLET | Freq: Two times a day (BID) | ORAL | 6 refills | Status: DC

## 2016-09-12 MED ORDER — HEPARIN SOD (PORK) LOCK FLUSH 100 UNIT/ML IV SOLN
500.0000 [IU] | Freq: Once | INTRAVENOUS | Status: AC | PRN
  Administered 2016-09-12: 500 [IU] via INTRAVENOUS
  Filled 2016-09-12: qty 5

## 2016-09-12 NOTE — Assessment & Plan Note (Addendum)
Metastatic breast cancer- Aug 2017- CAT scan shows progressive disease in the right shoulder/underarm; and also the adrenal lesion. Currently in Doxil every 28 days; s/p cycle #4- Jan 12th 2018- CT C/A/P- Mixed response; however today- concerns for progression right arm chest wall [C discussion below]  # Currently s/p cycle # 6 of Doxil.  Progressive disease noted in Arm/chest wall- recommend pallative radiation. Discussed with Dr.Crystal; plan palliative radiation for 10 fractions.   # Regards to systemic therapy I would recommend that Fasoldex + Abemaclib 150 BID. Start faslodex at next visit.   # PE- Moderate- based on CT imaging incidental. Patient is asymptomatic. Patient is on Eliquis.   # Lymphedema RIght UE- Secondary to malignancy; slightly worse.  # Right shoulder wound/ necrotic ulcer- worsened;see discussion above.   # Follow up in 3 weeks/labs/Ca 27-29; CEA. Start faslodex at that visit.

## 2016-09-12 NOTE — Progress Notes (Signed)
Patient here today for follow up.  Patient states no new concerns today  

## 2016-09-12 NOTE — Progress Notes (Signed)
[name]  [phone]  [phone]     [name] DOB: 1955/07/26  MR#: [id number]  AOZ#:[id number]  Patient Care Team: Kirk Ruths, MD as PCP - General (Internal Medicine)  CHIEF COMPLAINT:  Chief Complaint  Patient presents with  . Follow-up    Carcinoma of overlapping sites of right breast in female, estrogen receptor positive     [place] OFFICE PROGRESS NOTE  Patient Care Team: Kirk Ruths, MD as PCP - General (Internal Medicine)  Breast cancer metastasized to bone [name])   Staging form: Breast, AJCC 7th Edition     Clinical stage from 06/27/2013: Stage IV (T4, N2, M1) - Signed by [name], NP on 12/25/2014    Oncology History   1. RIGHT Breast cancer [Neglected] T4 N2 M1 ER positive HER-2 negative, stage IV disease of the right breast with metastases to thoracic spine, right chest wall, adrenal gland. Jan 2015- Letrozole + [name] 2016- CEA-223]  2. July 2016- Alliance study-random Taxol; Progression- CEA ~200s/Imaging  3. April 2017- ERIBULIN; AUG 14th 2017- PROGRESSION right shouler girdle/ Left adernal mass  # Feb 17 2016- GEM x2 cycles [poor tol-fevers/ Increased LFTs]; OCT 2017- CT- stable/ improved left adrenal mass  # OCT 25th 2017- DOXIL q 4w; JAN 12th CT C/A/P- STABLE/mixed response; cont Doxil;   # MARCH 19th 2018- clinical progression; Pal RT to Right UE.  # April 2018- START Faslodex + Abemaciclib.   # LYMPHEDEMA of RIGHT UE /Breast ulcerated lesion; AUG 2017 - [facility] PE- Eliquis     Breast cancer metastasized to bone ([place])   06/27/2013 Initial Diagnosis    Breast cancer metastasized to bone       Carcinoma of overlapping sites of right breast in female, estrogen receptor positive ([place])   04/11/2016 Initial Diagnosis    Carcinoma of overlapping sites of right breast in female, estrogen receptor positive ([place])     INTERVAL HISTORY:  A pleasant 61 year old  morbidly obese Caucasian female patient with above history of metastatic breast cancer On Doxil is here for follow-up.  Patient was recently treated with Augmentin for her necrotic ulcer of her right upper extremity. The foul-smelling discharge improved and then started again.   No nausea no vomiting. Appetite improved. No fever no chills. She complains of difficulty sleeping at night. Denies any significant anxiety or pain.  Patient denies any unusual shortness of breath or cough. Denies any pain. Tingling and numbness in extremities- on Neurontin.  REVIEW OF SYSTEMS:  A complete 10 point review of system is done which is negative except mentioned above/history of present illness.   PAST MEDICAL HISTORY :  Past Medical History:  Diagnosis Date  . Anemia   . Anginal pain ([place])   . Breast cancer ([place])    2014   . Breast cancer metastasized to bone ([place]) 11/21/2014  . Hypertension   . Last menstrual period (LMP) > 10 days ago 2007  . Lymphedema of right upper extremity   . Myocardial infarction   . Post-menopausal 2006  . Sleep apnea   . Wound drainage     PAST SURGICAL HISTORY :   Past Surgical History:  Procedure Laterality Date  . CORONARY ANGIOPLASTY WITH STENT PLACEMENT N/A   . HERNIA REPAIR     umbilical hernia repair  . PORTACATH PLACEMENT Right 02/03/2015   Procedure: INSERTION PORT-A-CATH;  Surgeon: [name], MD;  Location: [facility] ORS;  Service: General;  Laterality:  Right;    FAMILY HISTORY :   Family History  Problem Relation Age of Onset  . Atrial fibrillation Sister   . Hypertension Sister     SOCIAL HISTORY:   Social History  Substance Use Topics  . Smoking status: Former Smoker    Packs/day: 1.00    Types: Cigarettes    Quit date: 10/25/2004  . Smokeless tobacco: Never Used     Comment: stopped smoking greater than 7 years ago  . Alcohol use No    ALLERGIES:  is allergic to no known allergies.  MEDICATIONS:  Current Outpatient Prescriptions    Medication Sig Dispense Refill  . acetaminophen (TYLENOL) 500 MG tablet Take 1,000 mg by mouth every 6 (six) hours as needed.    Marland Kitchen apixaban (ELIQUIS) 5 MG TABS tablet Take 1 tablet (5 mg total) by mouth 2 (two) times daily. 180 tablet 1  . [facility] CALCIUM 600+D 600-800 MG-UNIT TABS Take 1 tablet by mouth daily. At noon  3  . fexofenadine (ALLEGRA) 180 MG tablet Take 180 mg by mouth daily.     . furosemide (LASIX) 20 MG tablet Take 20 mg by mouth daily.  1  . gabapentin (NEURONTIN) 300 MG capsule TAKE 1 CAPSULE BY MOUTH ONCE DAILY IN THE MORNING AND TAKE 2 CAPSULES BY MOUTH IN THE EVENING 90 capsule PRN  . lisinopril (PRINIVIL,ZESTRIL) 20 MG tablet Take 20 mg by mouth daily.    . metoprolol succinate (TOPROL-XL) 50 MG 24 hr tablet Take 50 mg by mouth 2 (two) times daily. Take with or immediately following a meal.    . omega-3 acid ethyl esters (LOVAZA) 1 G capsule Take 1 g by mouth at bedtime.    . phosphorus (PHOSPHA 250 NEUTRAL) 155-852-130 MG tablet Take 1 tablet (250 mg total) by mouth 2 (two) times daily. 180 tablet 1  . potassium chloride (K-DUR) 10 MEQ tablet Take 10 mEq by mouth daily.   1  . simvastatin (ZOCOR) 80 MG tablet Take 80 mg by mouth at bedtime.    . Abemaciclib 150 MG TABS Take 150 mg by mouth every 12 (twelve) hours. 60 tablet 6  . ondansetron (ZOFRAN) 8 MG tablet TAKE 1 TABLET (8MG) BY MOUTH TWICE DAILY START THE DAY AFTER CHEMOTHERAPY FOR 2 DAYS THEN TAKE AS NEEDED FOR NAUSEA OR VOMITING (Patient not taking: Reported on 09/12/2016) 30 tablet 0   No current facility-administered medications for this visit.    Facility-Administered Medications Ordered in Other Visits  Medication Dose Route Frequency Provider Last Rate Last Dose  . sodium chloride 0.9 % injection 10 mL  10 mL Intracatheter PRN Forest Gleason, MD   10 mL at 07/07/15 0911  . sodium chloride flush (NS) 0.9 % injection 10 mL  10 mL Intravenous PRN Forest Gleason, MD      . sodium chloride flush (NS) 0.9 % injection 10  mL  10 mL Intravenous PRN Cammie Sickle, MD   10 mL at 01/20/16 1320  . sodium chloride flush (NS) 0.9 % injection 10 mL  10 mL Intravenous PRN Cammie Sickle, MD   10 mL at 09/12/16 1000    PHYSICAL EXAMINATION: ECOG PERFORMANCE STATUS: 1 - Symptomatic but completely ambulatory  BP 118/77 (BP Location: Left Arm, Patient Position: Sitting)   Pulse 98   Temp (!) 96.9 F (36.1 C) (Tympanic)   Resp 18   Wt (!) 310 lb (140.6 kg)   SpO2 96%   BMI 51.59 kg/m   [employer]  09/12/16 0934  Weight: (!) 310 lb (140.6 kg)    GENERAL: Well-nourished well-developed; Alert, no distress and comfortable.  She is Accompanied by her husband. She is a wheelchair.. Morbidly obese. EYES: no pallor or icterus OROPHARYNX: no thrush or ulceration;  NECK: supple, no masses felt LYMPH:  no palpable lymphadenopathy in the cervical, axillary or inguinal regions LUNGS: clear to auscultation and  No wheeze or crackles HEART/CVS: regular rate & rhythm and no murmurs; chronic bilateral lower extremity edema. ABDOMEN:abdomen soft, non-tender and normal bowel sounds Musculoskeletal:no cyanosis of digits and no clubbing; right upper extremity lymphedema noted. PSYCH: alert & oriented x 3 with fluent speech NEURO: no focal motor/sensory deficits SKIN:   Right breast ulcerated/ scabbing lesion noted; no drainage. Right anterior [4cm] shoulder; shoulder lesions [ 4x4 cm in size] noted.Foul smelling in discharge noted from the right shoulder posterior ulcerated mass.  LABORATORY DATA:  I have reviewed the data as listed    Component Value Date/Time   NA 138 09/12/2016 0901   NA 142 08/01/2014 1435   K 4.0 09/12/2016 0901   K 3.5 08/01/2014 1435   CL 107 09/12/2016 0901   CL 103 08/01/2014 1435   CO2 23 09/12/2016 0901   CO2 27 08/01/2014 1435   GLUCOSE 182 (H) 09/12/2016 0901   GLUCOSE 148 (H) 08/01/2014 1435   BUN 13 09/12/2016 0901   BUN 13 08/01/2014 1435   CREATININE 0.73 09/12/2016  0901   CREATININE 0.71 10/02/2014 0930   CALCIUM 8.8 (L) 09/12/2016 0901   CALCIUM 8.6 (L) 10/02/2014 0930   PROT 7.2 09/12/2016 0901   PROT 7.6 10/02/2014 0930   ALBUMIN 3.0 (L) 09/12/2016 0901   ALBUMIN 3.7 10/02/2014 0930   AST 20 09/12/2016 0901   AST 21 10/02/2014 0930   ALT 10 (L) 09/12/2016 0901   ALT 23 10/02/2014 0930   ALKPHOS 61 09/12/2016 0901   ALKPHOS 41 10/02/2014 0930   BILITOT 0.4 09/12/2016 0901   BILITOT 0.5 10/02/2014 0930   GFRNONAA >60 09/12/2016 0901   GFRNONAA >60 10/02/2014 0930   GFRAA >60 09/12/2016 0901   GFRAA >60 10/02/2014 0930    No results found for: SPEP, UPEP  Lab Results  Component Value Date   WBC 9.6 09/12/2016   NEUTROABS 7.3 (H) 09/12/2016   HGB 11.4 (L) 09/12/2016   HCT 33.7 (L) 09/12/2016   MCV 86.5 09/12/2016   PLT 342 09/12/2016      Chemistry      Component Value Date/Time   NA 138 09/12/2016 0901   NA 142 08/01/2014 1435   K 4.0 09/12/2016 0901   K 3.5 08/01/2014 1435   CL 107 09/12/2016 0901   CL 103 08/01/2014 1435   CO2 23 09/12/2016 0901   CO2 27 08/01/2014 1435   BUN 13 09/12/2016 0901   BUN 13 08/01/2014 1435   CREATININE 0.73 09/12/2016 0901   CREATININE 0.71 10/02/2014 0930      Component Value Date/Time   CALCIUM 8.8 (L) 09/12/2016 0901   CALCIUM 8.6 (L) 10/02/2014 0930   ALKPHOS 61 09/12/2016 0901   ALKPHOS 41 10/02/2014 0930   AST 20 09/12/2016 0901   AST 21 10/02/2014 0930   ALT 10 (L) 09/12/2016 0901   ALT 23 10/02/2014 0930   BILITOT 0.4 09/12/2016 0901   BILITOT 0.5 10/02/2014 0930      IMPRESSION: 1. In general, the metastatic lesions shown on the prior exam are stable with the exception of the left adrenal metastatic  lesion which is reduced in size. 2. 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 3. Extensive atherosclerosis and mild cardiomegaly. 4. There is some vague indistinctness of the anterior  cortical margin of the left iliac bone. There could be some tumor eroding this bone all, along the left iliacus muscle, but I am not certain. There is enough artifact that this is questionable. There is also questionable lesions in the left upper sacrum, along with a small amount of presacral edema.  RADIOGRAPHIC STUDIES: I have personally reviewed the radiological images as listed and agreed with the findings in the report. No results found.    ASSESSMENT & PLAN:  Carcinoma of overlapping sites of right breast in female, estrogen receptor positive ([place]) Metastatic breast cancer- Aug 2017- CAT scan shows progressive disease in the right shoulder/underarm; and also the adrenal lesion. Currently in Doxil every 28 days; s/p cycle #4- Jan 12th 2018- CT C/A/P- Mixed response; however today- concerns for progression right arm chest wall [C discussion below]  # Currently s/p cycle # 6 of Doxil.  Progressive disease noted in Arm/chest wall- recommend pallative radiation. Discussed with Dr.Crystal; plan palliative radiation for 10 fractions.   # Regards to systemic therapy I would recommend that Fasoldex + Abemaclib 150 BID. Start faslodex at next visit.   # PE- Moderate- based on CT imaging incidental. Patient is asymptomatic. Patient is on Eliquis.   # Lymphedema RIght UE- Secondary to malignancy; slightly worse.  # Right shoulder wound/ necrotic ulcer- worsened;see discussion above.   # Follow up in 3 weeks/labs/Ca 27-29; CEA. Start faslodex at that visit.   Orders Placed This Encounter  Procedures  . CBC with Differential    Standing Status:   Future    Standing Expiration Date:   09/12/2017  . Comprehensive metabolic panel    Standing Status:   Future    Standing Expiration Date:   09/12/2017  . CEA    Standing Status:   Future    Standing Expiration Date:   09/12/2017  . Cancer antigen 27.29    Standing Status:   Future    Standing Expiration Date:   09/12/2017   All questions  were answered. The patient knows to call the clinic with any problems, questions or concerns.      Cammie Sickle, MD 09/12/2016 1:17 PMp  Review of Systems  All other systems reviewed and are negative.          09/12/2016

## 2016-09-12 NOTE — Telephone Encounter (Signed)
I also started pt on faslodex injections; and also verzinio [oral pills]- printed prescription. Needs to be approved; I did not talk to pt about this. Please inform her of above; however- we will get started on these new treatments when I see her at next visit.   Schedule her for faslodex at next visit. Thx

## 2016-09-12 NOTE — Telephone Encounter (Signed)
Spoke with patient. Pt is agreeable. Explained the process of obtaining the oral Verzinio from speciality pharmacy. Discussed with patient that md will submit the rx to the speciality. At this time, I do not know her insurance coverage cost for this drug. Patient states that she has 2 insurances-both with [employer]. She asked that I submit both cards to the specialty pharmacy. rx faxed 09/12/16 to [facility].

## 2016-09-12 NOTE — Consult Note (Signed)
NEW PATIENT EVALUATION  Name: [name]  MRN: [id number]  Date:   09/12/2016     DOB: [birth year]   This 61 y.o. female patient presents to the clinic for initial evaluation of necrotic breast cancer to her right shoulder for consideration of palliative radiation therapy.  REFERRING PHYSICIAN: Kirk Ruths, MD  CHIEF COMPLAINT: No chief complaint on file.   DIAGNOSIS: There were no encounter diagnoses.   PREVIOUS INVESTIGATIONS:  CT scans reviewed Pathology report reviewed Clinical notes reviewed  HPI: Patient is an obese 61 year old female originally diagnosed back in January 2015 with stage IV right breast cancer with metastasis to thoracic spine right chest wall and adrenal gland. Initially treated with letrozole and I Brandts. She was randomized on the Alliance study to Taxol with progression of disease. She's also been treated with ERIBULIN. She's recently been on Doxil. She has developed significant progression of disease in her right shoulder with CT scan demonstrating significant disease in her right axilla and right shoulder musculature. She also has now probable liver involvement. Her right axillary involvement abuts the brachial plexus and axillary neurovascular structures. She does have ulceration the skin causing significant odorous discharge from her right shoulder. Doxil has been discontinued I been asked to evaluate the patient for possible palliative radiation therapy.  PLANNED TREATMENT REGIMEN: Palliative radiation to her right shoulder  PAST MEDICAL HISTORY:  has a past medical history of Anemia; Anginal pain ([place]); Breast cancer ([place]); Breast cancer metastasized to bone ([place]) (11/21/2014); Hypertension; Last menstrual period (LMP) > 10 days ago (2007); Lymphedema of right upper extremity; Myocardial infarction; Post-menopausal (2006); Sleep apnea; and Wound drainage.    PAST SURGICAL HISTORY:  Past Surgical History:  Procedure Laterality Date  .  CORONARY ANGIOPLASTY WITH STENT PLACEMENT N/A   . HERNIA REPAIR     umbilical hernia repair  . PORTACATH PLACEMENT Right 02/03/2015   Procedure: INSERTION PORT-A-CATH;  Surgeon: [name], MD;  Location: [facility] ORS;  Service: General;  Laterality: Right;    FAMILY HISTORY: family history includes Atrial fibrillation in her sister; Hypertension in her sister.  SOCIAL HISTORY:  reports that she quit smoking about 11 years ago. Her smoking use included Cigarettes. She smoked 1.00 pack per day. She has never used smokeless tobacco. She reports that she does not drink alcohol or use drugs.  ALLERGIES: No known allergies  MEDICATIONS:  Current Outpatient Prescriptions  Medication Sig Dispense Refill  . acetaminophen (TYLENOL) 500 MG tablet Take 1,000 mg by mouth every 6 (six) hours as needed.    Marland Kitchen apixaban (ELIQUIS) 5 MG TABS tablet Take 1 tablet (5 mg total) by mouth 2 (two) times daily. 180 tablet 1  . [facility] CALCIUM 600+D 600-800 MG-UNIT TABS Take 1 tablet by mouth daily. At noon  3  . fexofenadine (ALLEGRA) 180 MG tablet Take 180 mg by mouth daily.     . furosemide (LASIX) 20 MG tablet Take 20 mg by mouth daily.  1  . gabapentin (NEURONTIN) 300 MG capsule TAKE 1 CAPSULE BY MOUTH ONCE DAILY IN THE MORNING AND TAKE 2 CAPSULES BY MOUTH IN THE EVENING 90 capsule PRN  . lisinopril (PRINIVIL,ZESTRIL) 20 MG tablet Take 20 mg by mouth daily.    . metoprolol succinate (TOPROL-XL) 50 MG 24 hr tablet Take 50 mg by mouth 2 (two) times daily. Take with or immediately following a meal.    . omega-3 acid ethyl esters (LOVAZA) 1 G capsule Take 1 g by mouth at bedtime.    Marland Kitchen  ondansetron (ZOFRAN) 8 MG tablet TAKE 1 TABLET (8MG ) BY MOUTH TWICE DAILY START THE DAY AFTER CHEMOTHERAPY FOR 2 DAYS THEN TAKE AS NEEDED FOR NAUSEA OR VOMITING (Patient not taking: Reported on 09/12/2016) 30 tablet 0  . phosphorus (PHOSPHA 250 NEUTRAL) 155-852-130 MG tablet Take 1 tablet (250 mg total) by mouth 2 (two) times daily. 180  tablet 1  . potassium chloride (K-DUR) 10 MEQ tablet Take 10 mEq by mouth daily.   1  . simvastatin (ZOCOR) 80 MG tablet Take 80 mg by mouth at bedtime.     No current facility-administered medications for this encounter.    Facility-Administered Medications Ordered in Other Encounters  Medication Dose Route Frequency Provider Last Rate Last Dose  . sodium chloride 0.9 % injection 10 mL  10 mL Intracatheter PRN Forest Gleason, MD   10 mL at 07/07/15 0911  . sodium chloride flush (NS) 0.9 % injection 10 mL  10 mL Intravenous PRN Forest Gleason, MD      . sodium chloride flush (NS) 0.9 % injection 10 mL  10 mL Intravenous PRN Cammie Sickle, MD   10 mL at 01/20/16 1320  . sodium chloride flush (NS) 0.9 % injection 10 mL  10 mL Intravenous PRN Cammie Sickle, MD   10 mL at 09/12/16 1000    ECOG PERFORMANCE STATUS:  1 - Symptomatic but completely ambulatory  REVIEW OF SYSTEMS: Except for the lymphedema in her right upper extremity and the significant ulceration of the skin  Patient denies any weight loss, fatigue, weakness, fever, chills or night sweats. Patient denies any loss of vision, blurred vision. Patient denies any ringing  of the ears or hearing loss. No irregular heartbeat. Patient denies heart murmur or history of fainting. Patient denies any chest pain or pain radiating to her upper extremities. Patient denies any shortness of breath, difficulty breathing at night, cough or hemoptysis. Patient denies any swelling in the lower legs. Patient denies any nausea vomiting, vomiting of blood, or coffee ground material in the vomitus. Patient denies any stomach pain. Patient states has had normal bowel movements no significant constipation or diarrhea. Patient denies any dysuria, hematuria or significant nocturia. Patient denies any problems walking, swelling in the joints or loss of balance. Patient denies any skin changes, loss of hair or loss of weight. Patient denies any excessive  worrying or anxiety or significant depression. Patient denies any problems with insomnia. Patient denies excessive thirst, polyuria, polydipsia. Patient denies any swollen glands, patient denies easy bruising or easy bleeding. Patient denies any recent infections, allergies or URI. Patient "s visual fields have not changed significantly in recent time.   PHYSICAL EXAM: There were no vitals taken for this visit. Morbidly obese female in NAD she has a lymphedema wrap on her right upper extremity. Significant ulceration in the right axilla and right shoulder region. She also has an area of ulceration her right breast consistent with known primary breast cancer. Well-developed well-nourished patient in NAD. HEENT reveals PERLA, EOMI, discs not visualized.  Oral cavity is clear. No oral mucosal lesions are identified. Neck is clear without evidence of cervical or supraclavicular adenopathy. Lungs are clear to A&P. Cardiac examination is essentially unremarkable with regular rate and rhythm without murmur rub or thrill. Abdomen is benign with no organomegaly or masses noted. Motor sensory and DTR levels are equal and symmetric in the upper and lower extremities. Cranial nerves II through XII are grossly intact. Proprioception is intact. No peripheral adenopathy or edema is  identified. No motor or sensory levels are noted. Crude visual fields are within normal range.  LABORATORY DATA: Pathology reports reviewed    RADIOLOGY RESULTS: CT scans reviewed   IMPRESSION: Stage IV breast cancer with significant ulcerative disease and involvement of the right shoulder musculature in morbidly obese 61 year old female  PLAN: At this time like to go ahead with a course of palliative radiation therapy to her right shoulder. I will try to incorporate all areas of tumor involvement and treat 3000 cGy in 10 fractions and evaluate for response. Hopefully can prevent some of the ulceration and discharge and alleviate some of  her pain. Some of her shooting pain in her right upper extremity may be related to brachial plexus involvement. I personally set up and ordered CT simulation for later this week. Patient may have a significant more prominent response based on her recent Doxil treatment.There will be extra effort by both professional staff as well as technical staff to coordinate and manage concurrent chemoradiation and ensuing side effects during her treatments. Risks and side effects including possible worsening of her lymphedema skin reaction fatigue alteration of blood counts all were discussed in detail with the patient. I personally discussed this case with her medical oncologist.  I would like to take this opportunity to thank you for allowing me to participate in the care of your patient.Armstead Peaks., MD

## 2016-09-13 LAB — CANCER ANTIGEN 27.29: CA 27.29: 61.3 U/mL — ABNORMAL HIGH (ref 0.0–38.6)

## 2016-09-13 LAB — CEA: CEA: 169.4 ng/mL — AB (ref 0.0–4.7)

## 2016-09-14 ENCOUNTER — Ambulatory Visit
Admission: RE | Admit: 2016-09-14 | Discharge: 2016-09-14 | Disposition: A | Discharge status: Home / Self Care | Payer: BC Managed Care – PPO | Source: Ambulatory Visit | Attending: Radiation Oncology | Admitting: Radiation Oncology

## 2016-09-14 DIAGNOSIS — I252 Old myocardial infarction: Secondary | ICD-10-CM | POA: Diagnosis not present

## 2016-09-14 DIAGNOSIS — I10 Essential (primary) hypertension: Secondary | ICD-10-CM | POA: Diagnosis not present

## 2016-09-14 DIAGNOSIS — C7970 Secondary malignant neoplasm of unspecified adrenal gland: Secondary | ICD-10-CM | POA: Diagnosis not present

## 2016-09-14 DIAGNOSIS — C7951 Secondary malignant neoplasm of bone: Secondary | ICD-10-CM | POA: Diagnosis present

## 2016-09-14 DIAGNOSIS — D649 Anemia, unspecified: Secondary | ICD-10-CM | POA: Diagnosis not present

## 2016-09-14 DIAGNOSIS — C50411 Malignant neoplasm of upper-outer quadrant of right female breast: Secondary | ICD-10-CM | POA: Diagnosis not present

## 2016-09-14 DIAGNOSIS — Z170 Estrogen receptor positive status [ER+]: Secondary | ICD-10-CM | POA: Diagnosis not present

## 2016-09-14 DIAGNOSIS — Z79811 Long term (current) use of aromatase inhibitors: Secondary | ICD-10-CM | POA: Diagnosis not present

## 2016-09-14 DIAGNOSIS — Z510 Encounter for antineoplastic radiation therapy: Secondary | ICD-10-CM | POA: Diagnosis not present

## 2016-09-14 DIAGNOSIS — I209 Angina pectoris, unspecified: Secondary | ICD-10-CM | POA: Diagnosis not present

## 2016-09-14 DIAGNOSIS — Z87891 Personal history of nicotine dependence: Secondary | ICD-10-CM | POA: Diagnosis not present

## 2016-09-14 DIAGNOSIS — Z7901 Long term (current) use of anticoagulants: Secondary | ICD-10-CM | POA: Diagnosis not present

## 2016-09-14 DIAGNOSIS — I890 Lymphedema, not elsewhere classified: Secondary | ICD-10-CM | POA: Diagnosis not present

## 2016-09-14 DIAGNOSIS — E669 Obesity, unspecified: Secondary | ICD-10-CM | POA: Diagnosis not present

## 2016-09-14 DIAGNOSIS — Z79899 Other long term (current) drug therapy: Secondary | ICD-10-CM | POA: Diagnosis not present

## 2016-09-14 DIAGNOSIS — G4730 Sleep apnea, unspecified: Secondary | ICD-10-CM | POA: Diagnosis not present

## 2016-09-16 ENCOUNTER — Other Ambulatory Visit: Payer: Self-pay | Admitting: *Deleted

## 2016-09-16 DIAGNOSIS — C7951 Secondary malignant neoplasm of bone: Secondary | ICD-10-CM

## 2016-09-19 DIAGNOSIS — C7951 Secondary malignant neoplasm of bone: Secondary | ICD-10-CM | POA: Diagnosis not present

## 2016-09-21 ENCOUNTER — Ambulatory Visit
Admission: RE | Admit: 2016-09-21 | Discharge: 2016-09-21 | Disposition: A | Discharge status: Home / Self Care | Payer: BC Managed Care – PPO | Source: Ambulatory Visit | Attending: Radiation Oncology | Admitting: Radiation Oncology

## 2016-09-21 DIAGNOSIS — C7951 Secondary malignant neoplasm of bone: Secondary | ICD-10-CM | POA: Diagnosis not present

## 2016-09-22 ENCOUNTER — Telehealth: Payer: Self-pay | Admitting: *Deleted

## 2016-09-22 ENCOUNTER — Ambulatory Visit
Admission: RE | Admit: 2016-09-22 | Discharge: 2016-09-22 | Disposition: A | Discharge status: Home / Self Care | Payer: BC Managed Care – PPO | Source: Ambulatory Visit | Attending: Radiation Oncology | Admitting: Radiation Oncology

## 2016-09-22 DIAGNOSIS — C7951 Secondary malignant neoplasm of bone: Secondary | ICD-10-CM | POA: Diagnosis not present

## 2016-09-22 NOTE — Telephone Encounter (Signed)
[facility] pharmacist (x [phone]) has been attempting to reach pt w/o success. The number they have been attempting to reach patient at is [phone]. The vm is not set up and unable to leave vm for patient to arrange for shipment. I contacted [facility] back and gave her the pt's phone # of [phone].    I also personally contacted pt and left a vm asking pt to call [facility] to arrange for shipment of her Abemaciclib.

## 2016-09-22 NOTE — Telephone Encounter (Signed)
Pt states that she will be able to receive her Abemaciclib free without any copay. Should receive shipment tomorrow. Pt states that the [facility] pharmacist told her not to take until her fasoldex injection. Will clarify start date with md.

## 2016-09-22 NOTE — Telephone Encounter (Signed)
Pt requesting Cutimed Siltec Sorbact bandages 5x5 inches.Needs RX for supplies faxed to [facility] Phone number is [phone].  Fax [phone].  She currently receives 30 days supply- continuous. Performed daily dressing Changing.  Patient informed MD office will submit new prescription for pt's dressings for her shoulder wound.

## 2016-09-23 ENCOUNTER — Ambulatory Visit
Admission: RE | Admit: 2016-09-23 | Discharge: 2016-09-23 | Disposition: A | Discharge status: Home / Self Care | Payer: BC Managed Care – PPO | Source: Ambulatory Visit | Attending: Radiation Oncology | Admitting: Radiation Oncology

## 2016-09-23 DIAGNOSIS — C7951 Secondary malignant neoplasm of bone: Secondary | ICD-10-CM | POA: Diagnosis not present

## 2016-09-26 ENCOUNTER — Telehealth: Payer: Self-pay | Admitting: *Deleted

## 2016-09-26 ENCOUNTER — Ambulatory Visit
Admission: RE | Admit: 2016-09-26 | Discharge: 2016-09-26 | Disposition: A | Discharge status: Home / Self Care | Payer: BC Managed Care – PPO | Source: Ambulatory Visit | Attending: Radiation Oncology | Admitting: Radiation Oncology

## 2016-09-26 ENCOUNTER — Other Ambulatory Visit: Payer: Self-pay | Admitting: *Deleted

## 2016-09-26 DIAGNOSIS — C7951 Secondary malignant neoplasm of bone: Secondary | ICD-10-CM | POA: Diagnosis not present

## 2016-09-26 MED ORDER — HYDROCODONE-ACETAMINOPHEN 5-325 MG PO TABS: 1.0000 | ORAL_TABLET | Freq: Four times a day (QID) | ORAL | 0 refills | Status: DC | PRN

## 2016-09-26 NOTE — Telephone Encounter (Signed)
-----   Message from Shawnee Knapp, RN sent at 09/26/2016  3:55 PM EDT ----- Regarding: Lymphedema Pain Patient called c/o increased pain in right arm, unrelieved by tylenol.  She just started radiation and it has increased a great deal over the weekend-please advise-

## 2016-09-26 NOTE — Telephone Encounter (Signed)
Spoke with Dr. Rogue Bussing- RX written for Norco 5/325 1 tablet every 6 to 8 hrs as needed for moderate to severe pain. Patient notified to that rx is ready to be picked up. RN discussed side effects of Norco with patient-including dizziness, constipation and gi distress.  Pt understands that she will need to bring her id to sign for/pick up rx. She has a radiation apt in am and will pick up rx at that visit at cancer center registration desk.

## 2016-09-27 ENCOUNTER — Ambulatory Visit
Admission: RE | Admit: 2016-09-27 | Discharge: 2016-09-27 | Disposition: A | Discharge status: Home / Self Care | Payer: BC Managed Care – PPO | Source: Ambulatory Visit | Attending: Radiation Oncology | Admitting: Radiation Oncology

## 2016-09-27 DIAGNOSIS — C7951 Secondary malignant neoplasm of bone: Secondary | ICD-10-CM | POA: Diagnosis not present

## 2016-09-28 ENCOUNTER — Ambulatory Visit
Admission: RE | Admit: 2016-09-28 | Discharge: 2016-09-28 | Disposition: A | Discharge status: Home / Self Care | Payer: BC Managed Care – PPO | Source: Ambulatory Visit | Attending: Radiation Oncology | Admitting: Radiation Oncology

## 2016-09-28 DIAGNOSIS — C7951 Secondary malignant neoplasm of bone: Secondary | ICD-10-CM | POA: Diagnosis not present

## 2016-09-29 ENCOUNTER — Inpatient Hospital Stay: Payer: BC Managed Care – PPO | Attending: Internal Medicine

## 2016-09-29 ENCOUNTER — Ambulatory Visit
Admission: RE | Admit: 2016-09-29 | Discharge: 2016-09-29 | Disposition: A | Discharge status: Home / Self Care | Payer: BC Managed Care – PPO | Source: Ambulatory Visit | Attending: Radiation Oncology | Admitting: Radiation Oncology

## 2016-09-29 DIAGNOSIS — Z87891 Personal history of nicotine dependence: Secondary | ICD-10-CM | POA: Diagnosis not present

## 2016-09-29 DIAGNOSIS — I252 Old myocardial infarction: Secondary | ICD-10-CM | POA: Diagnosis not present

## 2016-09-29 DIAGNOSIS — Z79818 Long term (current) use of other agents affecting estrogen receptors and estrogen levels: Secondary | ICD-10-CM | POA: Insufficient documentation

## 2016-09-29 DIAGNOSIS — C7951 Secondary malignant neoplasm of bone: Secondary | ICD-10-CM | POA: Insufficient documentation

## 2016-09-29 DIAGNOSIS — Z170 Estrogen receptor positive status [ER+]: Secondary | ICD-10-CM | POA: Diagnosis not present

## 2016-09-29 DIAGNOSIS — I2699 Other pulmonary embolism without acute cor pulmonale: Secondary | ICD-10-CM | POA: Insufficient documentation

## 2016-09-29 DIAGNOSIS — E669 Obesity, unspecified: Secondary | ICD-10-CM | POA: Diagnosis not present

## 2016-09-29 DIAGNOSIS — C50811 Malignant neoplasm of overlapping sites of right female breast: Secondary | ICD-10-CM | POA: Diagnosis present

## 2016-09-29 DIAGNOSIS — G4730 Sleep apnea, unspecified: Secondary | ICD-10-CM | POA: Diagnosis not present

## 2016-09-29 DIAGNOSIS — I10 Essential (primary) hypertension: Secondary | ICD-10-CM | POA: Insufficient documentation

## 2016-09-29 DIAGNOSIS — Z86711 Personal history of pulmonary embolism: Secondary | ICD-10-CM | POA: Insufficient documentation

## 2016-09-29 DIAGNOSIS — Z923 Personal history of irradiation: Secondary | ICD-10-CM | POA: Insufficient documentation

## 2016-09-29 DIAGNOSIS — L98499 Non-pressure chronic ulcer of skin of other sites with unspecified severity: Secondary | ICD-10-CM | POA: Diagnosis not present

## 2016-09-29 DIAGNOSIS — C7972 Secondary malignant neoplasm of left adrenal gland: Secondary | ICD-10-CM | POA: Diagnosis not present

## 2016-09-29 LAB — CBC
HEMATOCRIT: 30.9 % — AB (ref 35.0–47.0)
Hemoglobin: 10.3 g/dL — ABNORMAL LOW (ref 12.0–16.0)
MCH: 28.6 pg (ref 26.0–34.0)
MCHC: 33.2 g/dL (ref 32.0–36.0)
MCV: 86 fL (ref 80.0–100.0)
PLATELETS: 272 10*3/uL (ref 150–440)
RBC: 3.6 MIL/uL — AB (ref 3.80–5.20)
RDW: 19.9 % — ABNORMAL HIGH (ref 11.5–14.5)
WBC: 9.2 10*3/uL (ref 3.6–11.0)

## 2016-09-30 ENCOUNTER — Ambulatory Visit
Admission: RE | Admit: 2016-09-30 | Discharge: 2016-09-30 | Disposition: A | Discharge status: Home / Self Care | Payer: BC Managed Care – PPO | Source: Ambulatory Visit | Attending: Radiation Oncology | Admitting: Radiation Oncology

## 2016-09-30 ENCOUNTER — Telehealth: Payer: Self-pay | Admitting: Pharmacist

## 2016-09-30 DIAGNOSIS — C7951 Secondary malignant neoplasm of bone: Secondary | ICD-10-CM | POA: Diagnosis not present

## 2016-09-30 NOTE — Telephone Encounter (Signed)
Initial Oral Chemotherapy Pharmacist Education - Abemaciclib  [name] is a 61 year old female diagnosed with ER+ breast cancer. Patient was called on 09/30/2016 to discuss new medication, abemiciclib.  - Medication/dose: Abemaciclib 150mg  PO BID (with fulvestrant)  - Start date/med received date: Received medication this week and will start to take when instructed by oncologist - Indication and goal of therapy: Prevent breast cancer progression - Administration: Take with or without food about the same time each day - Adverse Effects: Discussed including but not limited to diarrhea (take loperamide), neutropenia, anemia, nausea, liver problems, and increased venous thromboembolism risk - Drug Interactions: No drug interactions noted with abemaciclib and patients current Epic med list.  - Handling precautions: handle with caution, keep away from other family members and pets.  Patient understands what we discussed today about her new medication, and understands to call with and additional questions or concerns that may arise.   Demetrius Charity, PharmD Acute Care Pharmacy Resident  Pager: [phone] 09/30/2016

## 2016-10-03 ENCOUNTER — Inpatient Hospital Stay (HOSPITAL_BASED_OUTPATIENT_CLINIC_OR_DEPARTMENT_OTHER): Payer: BC Managed Care – PPO | Admitting: Internal Medicine

## 2016-10-03 ENCOUNTER — Inpatient Hospital Stay: Payer: BC Managed Care – PPO

## 2016-10-03 ENCOUNTER — Ambulatory Visit
Admission: RE | Admit: 2016-10-03 | Discharge: 2016-10-03 | Disposition: A | Discharge status: Home / Self Care | Payer: BC Managed Care – PPO | Source: Ambulatory Visit | Attending: Radiation Oncology | Admitting: Radiation Oncology

## 2016-10-03 VITALS — BP 112/76 | HR 123 | Temp 98.8°F | Resp 18 | Ht 65.0 in

## 2016-10-03 DIAGNOSIS — Z79818 Long term (current) use of other agents affecting estrogen receptors and estrogen levels: Secondary | ICD-10-CM

## 2016-10-03 DIAGNOSIS — I2699 Other pulmonary embolism without acute cor pulmonale: Secondary | ICD-10-CM | POA: Diagnosis not present

## 2016-10-03 DIAGNOSIS — Z923 Personal history of irradiation: Secondary | ICD-10-CM

## 2016-10-03 DIAGNOSIS — Z170 Estrogen receptor positive status [ER+]: Secondary | ICD-10-CM | POA: Diagnosis not present

## 2016-10-03 DIAGNOSIS — C7951 Secondary malignant neoplasm of bone: Secondary | ICD-10-CM | POA: Diagnosis not present

## 2016-10-03 DIAGNOSIS — C50811 Malignant neoplasm of overlapping sites of right female breast: Secondary | ICD-10-CM | POA: Diagnosis not present

## 2016-10-03 DIAGNOSIS — Z95828 Presence of other vascular implants and grafts: Secondary | ICD-10-CM

## 2016-10-03 DIAGNOSIS — L98499 Non-pressure chronic ulcer of skin of other sites with unspecified severity: Secondary | ICD-10-CM

## 2016-10-03 DIAGNOSIS — Z7189 Other specified counseling: Secondary | ICD-10-CM

## 2016-10-03 LAB — COMPREHENSIVE METABOLIC PANEL
ALT: 7 U/L — AB (ref 14–54)
AST: 21 U/L (ref 15–41)
Albumin: 2.8 g/dL — ABNORMAL LOW (ref 3.5–5.0)
Alkaline Phosphatase: 51 U/L (ref 38–126)
Anion gap: 11 (ref 5–15)
BUN: 9 mg/dL (ref 6–20)
CALCIUM: 8.8 mg/dL — AB (ref 8.9–10.3)
CHLORIDE: 101 mmol/L (ref 101–111)
CO2: 24 mmol/L (ref 22–32)
CREATININE: 0.68 mg/dL (ref 0.44–1.00)
Glucose, Bld: 142 mg/dL — ABNORMAL HIGH (ref 65–99)
Potassium: 3.3 mmol/L — ABNORMAL LOW (ref 3.5–5.1)
Sodium: 136 mmol/L (ref 135–145)
Total Bilirubin: 0.5 mg/dL (ref 0.3–1.2)
Total Protein: 7.7 g/dL (ref 6.5–8.1)

## 2016-10-03 LAB — CBC WITH DIFFERENTIAL/PLATELET
Basophils Absolute: 0.1 10*3/uL (ref 0–0.1)
Basophils Relative: 1 %
EOS PCT: 3 %
Eosinophils Absolute: 0.3 10*3/uL (ref 0–0.7)
HCT: 31.7 % — ABNORMAL LOW (ref 35.0–47.0)
Hemoglobin: 10.3 g/dL — ABNORMAL LOW (ref 12.0–16.0)
LYMPHS ABS: 0.9 10*3/uL — AB (ref 1.0–3.6)
LYMPHS PCT: 11 %
MCH: 27.8 pg (ref 26.0–34.0)
MCHC: 32.5 g/dL (ref 32.0–36.0)
MCV: 85.5 fL (ref 80.0–100.0)
MONO ABS: 0.6 10*3/uL (ref 0.2–0.9)
MONOS PCT: 7 %
NEUTROS PCT: 78 %
Neutro Abs: 6.9 10*3/uL — ABNORMAL HIGH (ref 1.4–6.5)
PLATELETS: 281 10*3/uL (ref 150–440)
RBC: 3.7 MIL/uL — AB (ref 3.80–5.20)
RDW: 20.1 % — ABNORMAL HIGH (ref 11.5–14.5)
WBC: 8.8 10*3/uL (ref 3.6–11.0)

## 2016-10-03 MED ORDER — FULVESTRANT 250 MG/5ML IM SOLN
500.0000 mg | Freq: Once | INTRAMUSCULAR | Status: AC
  Administered 2016-10-03: 500 mg via INTRAMUSCULAR
  Filled 2016-10-03: qty 10

## 2016-10-03 NOTE — Progress Notes (Signed)
[facility]  [phone]  Fax:([phone]) [phone]     [name] DOB: 06-25-1956  MR#: [id number]  XVQ#:[id number]  Patient Care Team: Kirk Ruths, MD as PCP - General (Internal Medicine)  CHIEF COMPLAINT:  No chief complaint on file.   [facility] OFFICE PROGRESS NOTE  Patient Care Team: Kirk Ruths, MD as PCP - General (Internal Medicine)  Breast cancer metastasized to bone [facility])   Staging form: Breast, AJCC 7th Edition     Clinical stage from 06/27/2013: Stage IV (T4, N2, M1) - Signed by [name], NP on 12/25/2014    Oncology History   1. RIGHT Breast cancer [Neglected] T4 N2 M1 ER positive HER-2 negative, stage IV disease of the right breast with metastases to thoracic spine, right chest wall, adrenal gland. Jan 2015- Letrozole + [name] 2016- CEA-223]  2. July 2016- [facility] study-random Taxol; Progression- CEA ~200s/Imaging  3. April 2017- ERIBULIN; AUG 14th 2017- PROGRESSION right shouler girdle/ Left adernal mass  # Feb 17 2016- GEM x2 cycles [poor tol-fevers/ Increased LFTs]; OCT 2017- CT- stable/ improved left adrenal mass  # OCT 25th 2017- DOXIL q 4w; JAN 12th CT C/A/P- STABLE/mixed response; cont Doxil;   # MARCH 19th 2018- clinical progression; Pal RT to Right UE.  # April 2018- START Faslodex + Abemaciclib.   # LYMPHEDEMA of RIGHT UE /Breast ulcerated lesion; AUG 2017 - [facility] PE- Eliquis     Breast cancer metastasized to bone ([place])   06/27/2013 Initial Diagnosis    Breast cancer metastasized to bone       Carcinoma of overlapping sites of right breast in female, estrogen receptor positive ([place])   INTERVAL HISTORY:  A pleasant 61 year old morbidly obese Caucasian female patient with above history of metastatic breast cancer Most recently on Doxil is here for follow-up.   Patient is currently getting palliative radiation to the necrotic right shoulder mass. She has noted  improvement in the pain in the last few days. She continues to take hydrocodone 1 every 8 hours as needed.  No nausea no vomiting. Appetite improved. No fever no chills.Denies any significant anxiety or pain. Patient denies any unusual shortness of breath or cough. Tingling and numbness in extremities- on Neurontin. Patient received her amebaciclib.  REVIEW OF SYSTEMS:  A complete 10 point review of system is done which is negative except mentioned above/history of present illness.   PAST MEDICAL HISTORY :  Past Medical History:  Diagnosis Date  . Anemia   . Anginal pain ([place])   . Breast cancer ([place])    2014   . Breast cancer metastasized to bone ([place]) 11/21/2014  . Hypertension   . Last menstrual period (LMP) > 10 days ago 2007  . Lymphedema of right upper extremity   . Myocardial infarction   . Post-menopausal 2006  . Sleep apnea   . Wound drainage     PAST SURGICAL HISTORY :   Past Surgical History:  Procedure Laterality Date  . CORONARY ANGIOPLASTY WITH STENT PLACEMENT N/A   . HERNIA REPAIR     umbilical hernia repair  . PORTACATH PLACEMENT Right 02/03/2015   Procedure: INSERTION PORT-A-CATH;  Surgeon: [name], MD;  Location: [facility] ORS;  Service: General;  Laterality: Right;    FAMILY HISTORY :   Family History  Problem Relation Age of Onset  . Atrial fibrillation Sister   . Hypertension Sister     SOCIAL HISTORY:   Social History  Substance Use Topics  . Smoking status: Former Smoker    Packs/day: 1.00    Types: Cigarettes    Quit date: 10/25/2004  . Smokeless tobacco: Never Used     Comment: stopped smoking greater than 7 years ago  . Alcohol use No    ALLERGIES:  is allergic to no known allergies.  MEDICATIONS:  Current Outpatient Prescriptions  Medication Sig Dispense Refill  . apixaban (ELIQUIS) 5 MG TABS tablet Take 1 tablet (5 mg total) by mouth 2 (two) times daily. 180 tablet 1  . [facility] CALCIUM 600+D 600-800 MG-UNIT TABS Take 1 tablet by mouth  daily. At noon  3  . fexofenadine (ALLEGRA) 180 MG tablet Take 180 mg by mouth daily.     . furosemide (LASIX) 20 MG tablet Take 20 mg by mouth daily.  1  . gabapentin (NEURONTIN) 300 MG capsule TAKE 1 CAPSULE BY MOUTH ONCE DAILY IN THE MORNING AND TAKE 2 CAPSULES BY MOUTH IN THE EVENING 90 capsule PRN  . HYDROcodone-acetaminophen (NORCO) 5-325 MG tablet Take 1 tablet by mouth every 6 (six) hours as needed for moderate pain (every 6 to 8 hrs prn pain). 40 tablet 0  . lisinopril (PRINIVIL,ZESTRIL) 20 MG tablet Take 20 mg by mouth daily.    . metoprolol succinate (TOPROL-XL) 50 MG 24 hr tablet Take 50 mg by mouth 2 (two) times daily. Take with or immediately following a meal.    . omega-3 acid ethyl esters (LOVAZA) 1 G capsule Take 1 g by mouth at bedtime.    . phosphorus (PHOSPHA 250 NEUTRAL) 155-852-130 MG tablet Take 1 tablet (250 mg total) by mouth 2 (two) times daily. 180 tablet 1  . potassium chloride (K-DUR) 10 MEQ tablet Take 10 mEq by mouth daily.   1  . simvastatin (ZOCOR) 80 MG tablet Take 80 mg by mouth at bedtime.    . Abemaciclib 150 MG TABS Take 150 mg by mouth every 12 (twelve) hours. (Patient not taking: Reported on 10/03/2016) 60 tablet 6  . acetaminophen (TYLENOL) 500 MG tablet Take 1,000 mg by mouth every 6 (six) hours as needed.    . ondansetron (ZOFRAN) 8 MG tablet TAKE 1 TABLET (8MG) BY MOUTH TWICE DAILY START THE DAY AFTER CHEMOTHERAPY FOR 2 DAYS THEN TAKE AS NEEDED FOR NAUSEA OR VOMITING (Patient not taking: Reported on 09/12/2016) 30 tablet 0   No current facility-administered medications for this visit.    Facility-Administered Medications Ordered in Other Visits  Medication Dose Route Frequency Provider Last Rate Last Dose  . fulvestrant (FASLODEX) injection 500 mg  500 mg Intramuscular Once Cammie Sickle, MD      . sodium chloride 0.9 % injection 10 mL  10 mL Intracatheter PRN Forest Gleason, MD   10 mL at 07/07/15 0911  . sodium chloride flush (NS) 0.9 % injection  10 mL  10 mL Intravenous PRN Forest Gleason, MD      . sodium chloride flush (NS) 0.9 % injection 10 mL  10 mL Intravenous PRN Cammie Sickle, MD   10 mL at 01/20/16 1320    PHYSICAL EXAMINATION: ECOG PERFORMANCE STATUS: 1 - Symptomatic but completely ambulatory  BP 112/76   Pulse (!) 123   Temp 98.8 F (37.1 C) (Tympanic)   Resp 18   Ht 5' 5"  (1.651 m)   SpO2 96%   There were no vitals filed for this visit.  GENERAL: Well-nourished well-developed; Alert, no distress and comfortable.  She is Accompanied by her husband.  She is a walking by herself.  Morbidly obese. EYES: no pallor or icterus OROPHARYNX: no thrush or ulceration;  NECK: supple, no masses felt LYMPH:  no palpable lymphadenopathy in the cervical, axillary or inguinal regions LUNGS: clear to auscultation and  No wheeze or crackles HEART/CVS: regular rate & rhythm and no murmurs; chronic bilateral lower extremity edema. ABDOMEN:abdomen soft, non-tender and normal bowel sounds Musculoskeletal:no cyanosis of digits and no clubbing; right upper extremity lymphedema noted. PSYCH: alert & oriented x 3 with fluent speech NEURO: no focal motor/sensory deficits SKIN:   Right breast ulcerated/ scabbing lesion noted; no drainage. Right anterior [4cm] shoulder; shoulder lesions [ 4x4 cm in size] noted.Foul smelling in discharge noted from the right shoulder posterior ulcerated mass.  LABORATORY DATA:  I have reviewed the data as listed    Component Value Date/Time   NA 136 10/03/2016 1510   NA 142 08/01/2014 1435   K 3.3 (L) 10/03/2016 1510   K 3.5 08/01/2014 1435   CL 101 10/03/2016 1510   CL 103 08/01/2014 1435   CO2 24 10/03/2016 1510   CO2 27 08/01/2014 1435   GLUCOSE 142 (H) 10/03/2016 1510   GLUCOSE 148 (H) 08/01/2014 1435   BUN 9 10/03/2016 1510   BUN 13 08/01/2014 1435   CREATININE 0.68 10/03/2016 1510   CREATININE 0.71 10/02/2014 0930   CALCIUM 8.8 (L) 10/03/2016 1510   CALCIUM 8.6 (L) 10/02/2014 0930    PROT 7.7 10/03/2016 1510   PROT 7.6 10/02/2014 0930   ALBUMIN 2.8 (L) 10/03/2016 1510   ALBUMIN 3.7 10/02/2014 0930   AST 21 10/03/2016 1510   AST 21 10/02/2014 0930   ALT 7 (L) 10/03/2016 1510   ALT 23 10/02/2014 0930   ALKPHOS 51 10/03/2016 1510   ALKPHOS 41 10/02/2014 0930   BILITOT 0.5 10/03/2016 1510   BILITOT 0.5 10/02/2014 0930   GFRNONAA >60 10/03/2016 1510   GFRNONAA >60 10/02/2014 0930   GFRAA >60 10/03/2016 1510   GFRAA >60 10/02/2014 0930    No results found for: SPEP, UPEP  Lab Results  Component Value Date   WBC 8.8 10/03/2016   NEUTROABS 6.9 (H) 10/03/2016   HGB 10.3 (L) 10/03/2016   HCT 31.7 (L) 10/03/2016   MCV 85.5 10/03/2016   PLT 281 10/03/2016      Chemistry      Component Value Date/Time   NA 136 10/03/2016 1510   NA 142 08/01/2014 1435   K 3.3 (L) 10/03/2016 1510   K 3.5 08/01/2014 1435   CL 101 10/03/2016 1510   CL 103 08/01/2014 1435   CO2 24 10/03/2016 1510   CO2 27 08/01/2014 1435   BUN 9 10/03/2016 1510   BUN 13 08/01/2014 1435   CREATININE 0.68 10/03/2016 1510   CREATININE 0.71 10/02/2014 0930      Component Value Date/Time   CALCIUM 8.8 (L) 10/03/2016 1510   CALCIUM 8.6 (L) 10/02/2014 0930   ALKPHOS 51 10/03/2016 1510   ALKPHOS 41 10/02/2014 0930   AST 21 10/03/2016 1510   AST 21 10/02/2014 0930   ALT 7 (L) 10/03/2016 1510   ALT 23 10/02/2014 0930   BILITOT 0.5 10/03/2016 1510   BILITOT 0.5 10/02/2014 0930      IMPRESSION: 1. In general, the metastatic lesions shown on the prior exam are stable with the exception of the left adrenal metastatic lesion which is reduced in size. 2. 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 3. Extensive atherosclerosis and mild cardiomegaly. 4. There is some vague indistinctness of the anterior cortical margin of the left iliac bone. There could be some tumor eroding this bone all, along the left iliacus  muscle, but I am not certain. There is enough artifact that this is questionable. There is also questionable lesions in the left upper sacrum, along with a small amount of presacral edema.  RADIOGRAPHIC STUDIES: I have personally reviewed the radiological images as listed and agreed with the findings in the report. No results found.    ASSESSMENT & PLAN:  Carcinoma of overlapping sites of right breast in female, estrogen receptor positive ([place]) Metastatic breast cancer-ER/PR positive; Her 2 Neu Negative- s/p Doxil status post 6 cycles;  Jan 12th 2018- CT C/A/P- Mixed response; with progression right arm chest wall on palliative RT.   # Currently on palliative radiation to right shoulder- symptomatic improvement noted. Chest radiation to finish on April 11. Continue hydrocodone as here for pain.   # Regards to systemic therapy I would recommend that Fasoldex + Abemaclib 150 BID; start Faslodex today.  Start CDK inhibitor - day after finishing radiation. Discussed the potential side effects including but not limited to diarrhea and nausea vomiting. Discussed regarding the use of antidiarrheals.   # PE- Moderate- based on CT imaging incidental. Patient is asymptomatic. Patient is on Eliquis.   # Right shoulder wound/ necrotic ulcer- worsened;see discussion above. Pain control.  # # Reviewed/counselled regarding the goals of care- being palliative/treatment are usually indefinite-until progression or side effects. Goal is to maintain quality of life as the disease is incurable.  # Follow up in 4 weeks/labs/Ca 27-29; CEA/Faslodex M.D.; 2 weeks/Faslodex/no labs M.D.  No orders of the defined types were placed in this encounter.  All questions were answered. The patient knows to call the clinic with any problems, questions or concerns.      Cammie Sickle, MD 10/03/2016 4:48 PMp  Review of Systems  All other systems reviewed and are negative.          10/03/2016

## 2016-10-03 NOTE — Progress Notes (Signed)
Patient here for follow-up for breast cancer. Patient received her shipment of Abemaciclib. She also received imodium ad with the medication. She has not started this drug and was instructed not to by biologics until pt receive her fasoldex injection. Pt states that her pain mgmt is much improved / under control with the use of norco 5/325. Pt denies any pain at the present time.

## 2016-10-03 NOTE — Assessment & Plan Note (Addendum)
Metastatic breast cancer-ER/PR positive; Her 2 Neu Negative- s/p Doxil status post 6 cycles;  Jan 12th 2018- CT C/A/P- Mixed response; with progression right arm chest wall on palliative RT.   # Currently on palliative radiation to right shoulder- symptomatic improvement noted. Chest radiation to finish on April 11. Continue hydrocodone as here for pain.   # Regards to systemic therapy I would recommend that Fasoldex + Abemaclib 150 BID; start Faslodex today.  Start CDK inhibitor - day after finishing radiation. Discussed the potential side effects including but not limited to diarrhea and nausea vomiting. Discussed regarding the use of antidiarrheals.   # PE- Moderate- based on CT imaging incidental. Patient is asymptomatic. Patient is on Eliquis.   # Right shoulder wound/ necrotic ulcer- worsened;see discussion above. Pain control.  # # Reviewed/counselled regarding the goals of care- being palliative/treatment are usually indefinite-until progression or side effects. Goal is to maintain quality of life as the disease is incurable.  # Follow up in 4 weeks/labs/Ca 27-29; CEA/Faslodex M.D.; 2 weeks/Faslodex/no labs M.D.

## 2016-10-04 ENCOUNTER — Ambulatory Visit: Payer: BC Managed Care – PPO

## 2016-10-04 LAB — CEA: CEA: 167 ng/mL — ABNORMAL HIGH (ref 0.0–4.7)

## 2016-10-04 LAB — CANCER ANTIGEN 27.29: CA 27.29: 53.7 U/mL — ABNORMAL HIGH (ref 0.0–38.6)

## 2016-10-05 ENCOUNTER — Ambulatory Visit
Admission: RE | Admit: 2016-10-05 | Discharge: 2016-10-05 | Disposition: A | Discharge status: Home / Self Care | Payer: BC Managed Care – PPO | Source: Ambulatory Visit | Attending: Radiation Oncology | Admitting: Radiation Oncology

## 2016-10-05 ENCOUNTER — Ambulatory Visit: Payer: BC Managed Care – PPO

## 2016-10-05 DIAGNOSIS — C7951 Secondary malignant neoplasm of bone: Secondary | ICD-10-CM | POA: Diagnosis not present

## 2016-10-06 ENCOUNTER — Other Ambulatory Visit: Payer: Self-pay | Admitting: *Deleted

## 2016-10-06 ENCOUNTER — Ambulatory Visit
Admission: RE | Admit: 2016-10-06 | Discharge: 2016-10-06 | Disposition: A | Discharge status: Home / Self Care | Payer: BC Managed Care – PPO | Source: Ambulatory Visit | Attending: Radiation Oncology | Admitting: Radiation Oncology

## 2016-10-06 DIAGNOSIS — C7951 Secondary malignant neoplasm of bone: Secondary | ICD-10-CM | POA: Diagnosis not present

## 2016-10-06 MED ORDER — HYDROCODONE-ACETAMINOPHEN 5-325 MG PO TABS: 1.0000 | ORAL_TABLET | Freq: Four times a day (QID) | ORAL | 0 refills | Status: DC | PRN

## 2016-10-10 ENCOUNTER — Inpatient Hospital Stay: Payer: BC Managed Care – PPO

## 2016-10-10 ENCOUNTER — Inpatient Hospital Stay: Payer: BC Managed Care – PPO | Admitting: Internal Medicine

## 2016-10-13 ENCOUNTER — Other Ambulatory Visit: Payer: Self-pay | Admitting: Internal Medicine

## 2016-10-13 DIAGNOSIS — C50811 Malignant neoplasm of overlapping sites of right female breast: Secondary | ICD-10-CM

## 2016-10-13 DIAGNOSIS — Z170 Estrogen receptor positive status [ER+]: Principal | ICD-10-CM

## 2016-10-19 ENCOUNTER — Inpatient Hospital Stay: Payer: BC Managed Care – PPO

## 2016-10-19 ENCOUNTER — Other Ambulatory Visit: Payer: Self-pay | Admitting: *Deleted

## 2016-10-19 ENCOUNTER — Inpatient Hospital Stay (HOSPITAL_BASED_OUTPATIENT_CLINIC_OR_DEPARTMENT_OTHER): Payer: BC Managed Care – PPO | Admitting: Internal Medicine

## 2016-10-19 VITALS — BP 140/84 | HR 111 | Temp 99.9°F | Wt 303.1 lb

## 2016-10-19 DIAGNOSIS — Z95828 Presence of other vascular implants and grafts: Secondary | ICD-10-CM

## 2016-10-19 DIAGNOSIS — G4730 Sleep apnea, unspecified: Secondary | ICD-10-CM

## 2016-10-19 DIAGNOSIS — C50811 Malignant neoplasm of overlapping sites of right female breast: Secondary | ICD-10-CM

## 2016-10-19 DIAGNOSIS — I2699 Other pulmonary embolism without acute cor pulmonale: Secondary | ICD-10-CM

## 2016-10-19 DIAGNOSIS — Z170 Estrogen receptor positive status [ER+]: Principal | ICD-10-CM

## 2016-10-19 DIAGNOSIS — C7951 Secondary malignant neoplasm of bone: Secondary | ICD-10-CM | POA: Diagnosis not present

## 2016-10-19 DIAGNOSIS — Z923 Personal history of irradiation: Secondary | ICD-10-CM

## 2016-10-19 DIAGNOSIS — Z87891 Personal history of nicotine dependence: Secondary | ICD-10-CM

## 2016-10-19 DIAGNOSIS — E669 Obesity, unspecified: Secondary | ICD-10-CM | POA: Diagnosis not present

## 2016-10-19 DIAGNOSIS — L98499 Non-pressure chronic ulcer of skin of other sites with unspecified severity: Secondary | ICD-10-CM

## 2016-10-19 DIAGNOSIS — I10 Essential (primary) hypertension: Secondary | ICD-10-CM | POA: Diagnosis not present

## 2016-10-19 DIAGNOSIS — C7972 Secondary malignant neoplasm of left adrenal gland: Secondary | ICD-10-CM

## 2016-10-19 DIAGNOSIS — Z86711 Personal history of pulmonary embolism: Secondary | ICD-10-CM

## 2016-10-19 DIAGNOSIS — Z79818 Long term (current) use of other agents affecting estrogen receptors and estrogen levels: Secondary | ICD-10-CM

## 2016-10-19 DIAGNOSIS — I252 Old myocardial infarction: Secondary | ICD-10-CM

## 2016-10-19 MED ORDER — HYDROCODONE-ACETAMINOPHEN 5-325 MG PO TABS: 1.0000 | ORAL_TABLET | Freq: Four times a day (QID) | ORAL | 0 refills | Status: DC | PRN

## 2016-10-19 MED ORDER — DIAZEPAM 5 MG PO TABS: 5.0000 mg | ORAL_TABLET | Freq: Once | ORAL | 0 refills | Status: AC

## 2016-10-19 MED ORDER — FULVESTRANT 250 MG/5ML IM SOLN
500.0000 mg | Freq: Once | INTRAMUSCULAR | Status: AC
  Administered 2016-10-19: 500 mg via INTRAMUSCULAR
  Filled 2016-10-19: qty 10

## 2016-10-19 NOTE — Progress Notes (Signed)
[place]  [phone]  [phone]) [phone]     KATANYA SCHLIE DOB: 1955-09-25  MR#: [id number]  FMB#:[id number]  Patient Care Team: Kirk Ruths, MD as PCP - General (Internal Medicine)  CHIEF COMPLAINT:  Chief Complaint  Patient presents with  . Follow-up    Carcinoma of overlapping sites of right breast in female, estrogen receptor positive    [place] OFFICE PROGRESS NOTE  Patient Care Team: Kirk Ruths, MD as PCP - General (Internal Medicine)  Breast cancer metastasized to bone [facility])   Staging form: Breast, AJCC 7th Edition     Clinical stage from 06/27/2013: Stage IV (T4, N2, M1) - Signed by [name], NP on 12/25/2014    Oncology History   1. RIGHT Breast cancer [Neglected] T4 N2 M1 ER positive HER-2 negative, stage IV disease of the right breast with metastases to thoracic spine, right chest wall, adrenal gland. Jan 2015- Letrozole + [name] 2016- CEA-223]  2. July 2016- [facility] study-random Taxol; Progression- CEA ~200s/Imaging  3. April 2017- ERIBULIN; AUG 14th 2017- PROGRESSION right shouler girdle/ Left adernal mass  # Feb 17 2016- GEM x2 cycles [poor tol-fevers/ Increased LFTs]; OCT 2017- CT- stable/ improved left adrenal mass  # OCT 25th 2017- DOXIL q 4w; JAN 12th CT C/A/P- STABLE/mixed response; cont Doxil;   # MARCH 19th 2018- clinical progression; Pal RT to Right UE.  # April 2018- START Faslodex + Abemaciclib.   # LYMPHEDEMA of RIGHT UE /Breast ulcerated lesion; AUG 2017 - [facility] PE- Eliquis     Breast cancer metastasized to bone ([place])   06/27/2013 Initial Diagnosis    Breast cancer metastasized to bone       Carcinoma of overlapping sites of right breast in female, estrogen receptor positive ([place])   INTERVAL HISTORY:  A pleasant 61 year old morbidly obese Caucasian female patient with above history of metastatic breast cancer- Status post multiple lines of therapy most  recently started on Faslodex + Abemaciclib approximately 2 weeks ago is here for follow-up. Patient finished palliative radiation to the right shoulder area approximately 2 weeks ago.  Patient noted to have "rash" which is itchy all over her chest/ Back the last 2 weeks or so. It is itchy. She denies any bites.  Patient has been noting to have pain in her right shoulder for which she is taking hydrocodone 1 every 8 hours as needed. Patient had a few episodes of diarrhea; has been taking Imodium as needed. Patient denies any unusual shortness of breath or cough. Chronic mild tingling and numbness in her extremities.  REVIEW OF SYSTEMS:  A complete 10 point review of system is done which is negative except mentioned above/history of present illness.   PAST MEDICAL HISTORY :  Past Medical History:  Diagnosis Date  . Anemia   . Anginal pain ([employer])   . Breast cancer ([place])    2014   . Breast cancer metastasized to bone ([place]) 11/21/2014  . Hypertension   . Last menstrual period (LMP) > 10 days ago 2007  . Lymphedema of right upper extremity   . Myocardial infarction   . Post-menopausal 2006  . Sleep apnea   . Wound drainage     PAST SURGICAL HISTORY :   Past Surgical History:  Procedure Laterality Date  . CORONARY ANGIOPLASTY WITH STENT PLACEMENT N/A   . HERNIA REPAIR     umbilical hernia repair  . PORTACATH PLACEMENT Right 02/03/2015   Procedure: INSERTION  PORT-A-CATH;  Surgeon: [name], MD;  Location: [facility] ORS;  Service: General;  Laterality: Right;    FAMILY HISTORY :   Family History  Problem Relation Age of Onset  . Atrial fibrillation Sister   . Hypertension Sister     SOCIAL HISTORY:   Social History  Substance Use Topics  . Smoking status: Former Smoker    Packs/day: 1.00    Types: Cigarettes    Quit date: 10/25/2004  . Smokeless tobacco: Never Used     Comment: stopped smoking greater than 7 years ago  . Alcohol use No    ALLERGIES:  is allergic to no  known allergies.  MEDICATIONS:  Current Outpatient Prescriptions  Medication Sig Dispense Refill  . Abemaciclib 150 MG TABS Take 150 mg by mouth every 12 (twelve) hours. 60 tablet 6  . acetaminophen (TYLENOL) 500 MG tablet Take 1,000 mg by mouth every 6 (six) hours as needed.    . [facility] CALCIUM 600+D 600-800 MG-UNIT TABS Take 1 tablet by mouth daily. At noon  3  . ELIQUIS 5 MG TABS tablet TAKE 1 TABLET BY MOUTH TWICE DAILY 180 tablet PRN  . fexofenadine (ALLEGRA) 180 MG tablet Take 180 mg by mouth daily.     . furosemide (LASIX) 20 MG tablet Take 20 mg by mouth daily.  1  . gabapentin (NEURONTIN) 300 MG capsule TAKE 1 CAPSULE BY MOUTH ONCE DAILY IN THE MORNING AND TAKE 2 CAPSULES BY MOUTH IN THE EVENING 90 capsule PRN  . HYDROcodone-acetaminophen (NORCO) 5-325 MG tablet Take 1 tablet by mouth every 6 (six) hours as needed for moderate pain (every 6 to 8 hrs prn pain). 60 tablet 0  . lisinopril (PRINIVIL,ZESTRIL) 20 MG tablet Take 20 mg by mouth daily.    . metoprolol succinate (TOPROL-XL) 50 MG 24 hr tablet Take 50 mg by mouth 2 (two) times daily. Take with or immediately following a meal.    . omega-3 acid ethyl esters (LOVAZA) 1 G capsule Take 1 g by mouth at bedtime.    . phosphorus (PHOSPHA 250 NEUTRAL) 155-852-130 MG tablet Take 1 tablet (250 mg total) by mouth 2 (two) times daily. 180 tablet 1  . potassium chloride (K-DUR) 10 MEQ tablet Take 10 mEq by mouth daily.   1  . simvastatin (ZOCOR) 80 MG tablet Take 80 mg by mouth at bedtime.    . ondansetron (ZOFRAN) 8 MG tablet TAKE 1 TABLET (8MG) BY MOUTH TWICE DAILY START THE DAY AFTER CHEMOTHERAPY FOR 2 DAYS THEN TAKE AS NEEDED FOR NAUSEA OR VOMITING (Patient not taking: Reported on 10/19/2016) 30 tablet 0   No current facility-administered medications for this visit.    Facility-Administered Medications Ordered in Other Visits  Medication Dose Route Frequency Provider Last Rate Last Dose  . sodium chloride 0.9 % injection 10 mL  10 mL  Intracatheter PRN Forest Gleason, MD   10 mL at 07/07/15 0911  . sodium chloride flush (NS) 0.9 % injection 10 mL  10 mL Intravenous PRN Forest Gleason, MD      . sodium chloride flush (NS) 0.9 % injection 10 mL  10 mL Intravenous PRN Cammie Sickle, MD   10 mL at 01/20/16 1320    PHYSICAL EXAMINATION: ECOG PERFORMANCE STATUS: 1 - Symptomatic but completely ambulatory  BP 140/84 (BP Location: Left Arm, Patient Position: Sitting)   Pulse (!) 111   Temp 99.9 F (37.7 C) (Tympanic)   Wt (!) 303 lb 2 oz (137.5 kg)  BMI 50.44 kg/m   Filed Weights   10/19/16 1145  Weight: (!) 303 lb 2 oz (137.5 kg)    GENERAL: Well-nourished well-developed; Alert, no distress and comfortable.  She is Accompanied by her husband. She is a walking by herself.  Morbidly obese. EYES: no pallor or icterus OROPHARYNX: no thrush or ulceration;  NECK: supple, no masses felt LYMPH:  no palpable lymphadenopathy in the cervical, axillary or inguinal regions LUNGS: clear to auscultation and  No wheeze or crackles HEART/CVS: regular rate & rhythm and no murmurs; chronic bilateral lower extremity edema. ABDOMEN:abdomen soft, non-tender and normal bowel sounds Musculoskeletal:no cyanosis of digits and no clubbing; right upper extremity lymphedema noted. PSYCH: alert & oriented x 3 with fluent speech NEURO: no focal motor/sensory deficits SKIN:   Right breast ulcerated/ scabbing lesion noted; no drainage. Right anterior [4cm] shoulder; shoulder lesions [ 4x4 cm in size] noted.Foul smelling in discharge noted from the right shoulder posterior ulcerated mass. Multiple raised erythematous lesions noted anterior posterior chest.  LABORATORY DATA:  I have reviewed the data as listed    Component Value Date/Time   NA 136 10/03/2016 1510   NA 142 08/01/2014 1435   K 3.3 (L) 10/03/2016 1510   K 3.5 08/01/2014 1435   CL 101 10/03/2016 1510   CL 103 08/01/2014 1435   CO2 24 10/03/2016 1510   CO2 27 08/01/2014 1435    GLUCOSE 142 (H) 10/03/2016 1510   GLUCOSE 148 (H) 08/01/2014 1435   BUN 9 10/03/2016 1510   BUN 13 08/01/2014 1435   CREATININE 0.68 10/03/2016 1510   CREATININE 0.71 10/02/2014 0930   CALCIUM 8.8 (L) 10/03/2016 1510   CALCIUM 8.6 (L) 10/02/2014 0930   PROT 7.7 10/03/2016 1510   PROT 7.6 10/02/2014 0930   ALBUMIN 2.8 (L) 10/03/2016 1510   ALBUMIN 3.7 10/02/2014 0930   AST 21 10/03/2016 1510   AST 21 10/02/2014 0930   ALT 7 (L) 10/03/2016 1510   ALT 23 10/02/2014 0930   ALKPHOS 51 10/03/2016 1510   ALKPHOS 41 10/02/2014 0930   BILITOT 0.5 10/03/2016 1510   BILITOT 0.5 10/02/2014 0930   GFRNONAA >60 10/03/2016 1510   GFRNONAA >60 10/02/2014 0930   GFRAA >60 10/03/2016 1510   GFRAA >60 10/02/2014 0930    No results found for: SPEP, UPEP  Lab Results  Component Value Date   WBC 8.8 10/03/2016   NEUTROABS 6.9 (H) 10/03/2016   HGB 10.3 (L) 10/03/2016   HCT 31.7 (L) 10/03/2016   MCV 85.5 10/03/2016   PLT 281 10/03/2016      Chemistry      Component Value Date/Time   NA 136 10/03/2016 1510   NA 142 08/01/2014 1435   K 3.3 (L) 10/03/2016 1510   K 3.5 08/01/2014 1435   CL 101 10/03/2016 1510   CL 103 08/01/2014 1435   CO2 24 10/03/2016 1510   CO2 27 08/01/2014 1435   BUN 9 10/03/2016 1510   BUN 13 08/01/2014 1435   CREATININE 0.68 10/03/2016 1510   CREATININE 0.71 10/02/2014 0930      Component Value Date/Time   CALCIUM 8.8 (L) 10/03/2016 1510   CALCIUM 8.6 (L) 10/02/2014 0930   ALKPHOS 51 10/03/2016 1510   ALKPHOS 41 10/02/2014 0930   AST 21 10/03/2016 1510   AST 21 10/02/2014 0930   ALT 7 (L) 10/03/2016 1510   ALT 23 10/02/2014 0930   BILITOT 0.5 10/03/2016 1510   BILITOT 0.5 10/02/2014 0930  IMPRESSION: 1. In general, the metastatic lesions shown on the prior exam are stable with the exception of the left adrenal metastatic lesion which is reduced in size. 2. 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 3. Extensive atherosclerosis and mild cardiomegaly. 4. There is some vague indistinctness of the anterior cortical margin of the left iliac bone. There could be some tumor eroding this bone all, along the left iliacus muscle, but I am not certain. There is enough artifact that this is questionable. There is also questionable lesions in the left upper sacrum, along with a small amount of presacral edema.  RADIOGRAPHIC STUDIES: I have personally reviewed the radiological images as listed and agreed with the findings in the report. No results found.    ASSESSMENT & PLAN:  Carcinoma of overlapping sites of right breast in female, estrogen receptor positive ([place]) Metastatic breast cancer-ER/PR positive; Her 2 Neu Negative- status post multiple lines of therapy most recently on Faslodex + Abemaclib- start approximately 2 weeks ago. Patient tolerating treatment well except for mild diarrhea [C discussion below].  # Given the "skin rash'- concerning for clinical progression. However patient is started chemotherapy approximately 2 weeks ago. I would also recommend evaluation with Dr. Charissa Bash; possible biopsy of one of the skin lesions- to confirm progression.   # I discussed my concerns with the patient has been detail. Unfortunately she is status post multiple lines of therapy; also discussed that response rates/duration of response diminishes with each subsequent line of therapy. For now continue current therapy.  # Pain control- right chest wall pain shoulder pain secondary to malignancy. Continue hydrocodone one every 8 hours as needed. New prescription given.  # Diarrhea grade 1 from- CDK inhibitor continue Imodium as needed  # PE- Moderate- based on CT imaging incidental. Patient is asymptomatic. Patient is on Eliquis.   # Follow up in 2 weeks/labs/Ca 27-29; CEA/Faslodex M.D; PET scan prior [insurance issues; then CT scan].   Orders Placed This  Encounter  Procedures  . NM PET Image Restag (PS) Skull Base To Thigh    Standing Status:   Future    Standing Expiration Date:   12/19/2017    Order Specific Question:   Reason for Exam (SYMPTOM  OR DIAGNOSIS REQUIRED)    Answer:   breast cancer    Order Specific Question:   Is the patient pregnant?    Answer:   No    Order Specific Question:   Preferred imaging location?    Answer:   [facility]  . Ambulatory referral to General Surgery    Referral Priority:   Routine    Referral Type:   Surgical    Referral Reason:   Specialty Services Required    Referred to Provider:   Robert Bellow, MD    Requested Specialty:   General Surgery    Number of Visits Requested:   1   All questions were answered. The patient knows to call the clinic with any problems, questions or concerns.      Cammie Sickle, MD 10/19/2016 1:21 PMp  Review of Systems  All other systems reviewed and are negative.          10/19/2016

## 2016-10-19 NOTE — Assessment & Plan Note (Addendum)
Metastatic breast cancer-ER/PR positive; Her 2 Neu Negative- status post multiple lines of therapy most recently on Faslodex + Abemaclib- start approximately 2 weeks ago. Patient tolerating treatment well except for mild diarrhea [C discussion below].  # Given the "skin rash'- concerning for clinical progression. However patient is started chemotherapy approximately 2 weeks ago. I would also recommend evaluation with Dr. Charissa Bash; possible biopsy of one of the skin lesions- to confirm progression.   # I discussed my concerns with the patient has been detail. Unfortunately she is status post multiple lines of therapy; also discussed that response rates/duration of response diminishes with each subsequent line of therapy. For now continue current therapy.  # Pain control- right chest wall pain shoulder pain secondary to malignancy. Continue hydrocodone one every 8 hours as needed. New prescription given.  # Diarrhea grade 1 from- CDK inhibitor continue Imodium as needed  # PE- Moderate- based on CT imaging incidental. Patient is asymptomatic. Patient is on Eliquis.   # Follow up in 2 weeks/labs/Ca 27-29; CEA/Faslodex M.D; PET scan prior [insurance issues; then CT scan].

## 2016-10-19 NOTE — Progress Notes (Signed)
Patient here today for follow up.  Patient states she has some decrease in appetite and diarrhea 2x since starting Abemaciclib 2 weeks ago   .

## 2016-10-26 ENCOUNTER — Telehealth: Payer: Self-pay | Admitting: Internal Medicine

## 2016-10-26 ENCOUNTER — Telehealth: Payer: Self-pay | Admitting: *Deleted

## 2016-10-26 ENCOUNTER — Other Ambulatory Visit: Payer: Self-pay | Admitting: Internal Medicine

## 2016-10-26 DIAGNOSIS — C7951 Secondary malignant neoplasm of bone: Secondary | ICD-10-CM

## 2016-10-26 DIAGNOSIS — C50811 Malignant neoplasm of overlapping sites of right female breast: Secondary | ICD-10-CM

## 2016-10-26 DIAGNOSIS — Z170 Estrogen receptor positive status [ER+]: Principal | ICD-10-CM

## 2016-10-26 NOTE — Telephone Encounter (Signed)
Patient needs to call [facility], [phone], to get rx filled.  Original order form was sent to them with 12 refills on 09/23/16

## 2016-10-26 NOTE — Telephone Encounter (Signed)
msg given to [facility] in scheduling team

## 2016-10-26 NOTE — Telephone Encounter (Signed)
Advised to call [facility] for refills. She asked for the number and I gave it to her

## 2016-10-26 NOTE — Telephone Encounter (Signed)
Encounter opened in error

## 2016-10-26 NOTE — Telephone Encounter (Signed)
Cancel the PET scan; schedule CT C/A/P-orders are in. Thx

## 2016-10-26 NOTE — Telephone Encounter (Signed)
Asking for [place] to send in for more bandages for her

## 2016-10-28 ENCOUNTER — Ambulatory Visit: Payer: BC Managed Care – PPO

## 2016-10-29 ENCOUNTER — Inpatient Hospital Stay
Admission: EM | Admit: 2016-10-29 | Discharge: 2016-11-03 | DRG: 871 | Disposition: A | Discharge status: Home Health | Payer: BC Managed Care – PPO | Attending: Internal Medicine | Admitting: Internal Medicine

## 2016-10-29 ENCOUNTER — Encounter: Payer: Self-pay | Admitting: Emergency Medicine

## 2016-10-29 ENCOUNTER — Other Ambulatory Visit: Payer: Self-pay

## 2016-10-29 DIAGNOSIS — A419 Sepsis, unspecified organism: Principal | ICD-10-CM | POA: Diagnosis present

## 2016-10-29 DIAGNOSIS — Z170 Estrogen receptor positive status [ER+]: Secondary | ICD-10-CM

## 2016-10-29 DIAGNOSIS — L03113 Cellulitis of right upper limb: Secondary | ICD-10-CM | POA: Diagnosis present

## 2016-10-29 DIAGNOSIS — Z955 Presence of coronary angioplasty implant and graft: Secondary | ICD-10-CM | POA: Diagnosis not present

## 2016-10-29 DIAGNOSIS — Z79899 Other long term (current) drug therapy: Secondary | ICD-10-CM | POA: Diagnosis not present

## 2016-10-29 DIAGNOSIS — G4733 Obstructive sleep apnea (adult) (pediatric): Secondary | ICD-10-CM | POA: Diagnosis present

## 2016-10-29 DIAGNOSIS — L598 Other specified disorders of the skin and subcutaneous tissue related to radiation: Secondary | ICD-10-CM | POA: Diagnosis present

## 2016-10-29 DIAGNOSIS — Z7901 Long term (current) use of anticoagulants: Secondary | ICD-10-CM | POA: Diagnosis not present

## 2016-10-29 DIAGNOSIS — N179 Acute kidney failure, unspecified: Secondary | ICD-10-CM | POA: Diagnosis not present

## 2016-10-29 DIAGNOSIS — N189 Chronic kidney disease, unspecified: Secondary | ICD-10-CM | POA: Diagnosis not present

## 2016-10-29 DIAGNOSIS — N170 Acute kidney failure with tubular necrosis: Secondary | ICD-10-CM | POA: Diagnosis present

## 2016-10-29 DIAGNOSIS — C50811 Malignant neoplasm of overlapping sites of right female breast: Secondary | ICD-10-CM | POA: Diagnosis present

## 2016-10-29 DIAGNOSIS — C7970 Secondary malignant neoplasm of unspecified adrenal gland: Secondary | ICD-10-CM | POA: Diagnosis present

## 2016-10-29 DIAGNOSIS — I890 Lymphedema, not elsewhere classified: Secondary | ICD-10-CM | POA: Diagnosis present

## 2016-10-29 DIAGNOSIS — I110 Hypertensive heart disease with heart failure: Secondary | ICD-10-CM | POA: Diagnosis present

## 2016-10-29 DIAGNOSIS — E860 Dehydration: Secondary | ICD-10-CM | POA: Diagnosis not present

## 2016-10-29 DIAGNOSIS — E119 Type 2 diabetes mellitus without complications: Secondary | ICD-10-CM | POA: Diagnosis present

## 2016-10-29 DIAGNOSIS — I2510 Atherosclerotic heart disease of native coronary artery without angina pectoris: Secondary | ICD-10-CM | POA: Diagnosis present

## 2016-10-29 DIAGNOSIS — D61818 Other pancytopenia: Secondary | ICD-10-CM | POA: Diagnosis present

## 2016-10-29 DIAGNOSIS — C792 Secondary malignant neoplasm of skin: Secondary | ICD-10-CM

## 2016-10-29 DIAGNOSIS — I509 Heart failure, unspecified: Secondary | ICD-10-CM | POA: Diagnosis present

## 2016-10-29 DIAGNOSIS — I129 Hypertensive chronic kidney disease with stage 1 through stage 4 chronic kidney disease, or unspecified chronic kidney disease: Secondary | ICD-10-CM | POA: Diagnosis not present

## 2016-10-29 DIAGNOSIS — Z86711 Personal history of pulmonary embolism: Secondary | ICD-10-CM | POA: Diagnosis not present

## 2016-10-29 DIAGNOSIS — L98491 Non-pressure chronic ulcer of skin of other sites limited to breakdown of skin: Secondary | ICD-10-CM | POA: Diagnosis present

## 2016-10-29 DIAGNOSIS — E876 Hypokalemia: Secondary | ICD-10-CM | POA: Diagnosis present

## 2016-10-29 DIAGNOSIS — C7951 Secondary malignant neoplasm of bone: Secondary | ICD-10-CM | POA: Diagnosis present

## 2016-10-29 DIAGNOSIS — Z515 Encounter for palliative care: Secondary | ICD-10-CM | POA: Diagnosis present

## 2016-10-29 DIAGNOSIS — I252 Old myocardial infarction: Secondary | ICD-10-CM | POA: Diagnosis not present

## 2016-10-29 DIAGNOSIS — Z87891 Personal history of nicotine dependence: Secondary | ICD-10-CM

## 2016-10-29 DIAGNOSIS — T148XXA Other injury of unspecified body region, initial encounter: Secondary | ICD-10-CM

## 2016-10-29 DIAGNOSIS — C50919 Malignant neoplasm of unspecified site of unspecified female breast: Secondary | ICD-10-CM | POA: Diagnosis not present

## 2016-10-29 DIAGNOSIS — E669 Obesity, unspecified: Secondary | ICD-10-CM | POA: Diagnosis not present

## 2016-10-29 DIAGNOSIS — Z6841 Body Mass Index (BMI) 40.0 and over, adult: Secondary | ICD-10-CM

## 2016-10-29 DIAGNOSIS — L98499 Non-pressure chronic ulcer of skin of other sites with unspecified severity: Secondary | ICD-10-CM

## 2016-10-29 DIAGNOSIS — L089 Local infection of the skin and subcutaneous tissue, unspecified: Secondary | ICD-10-CM

## 2016-10-29 DIAGNOSIS — Z7189 Other specified counseling: Secondary | ICD-10-CM | POA: Diagnosis not present

## 2016-10-29 DIAGNOSIS — Z9221 Personal history of antineoplastic chemotherapy: Secondary | ICD-10-CM | POA: Diagnosis not present

## 2016-10-29 DIAGNOSIS — D649 Anemia, unspecified: Secondary | ICD-10-CM | POA: Diagnosis not present

## 2016-10-29 DIAGNOSIS — R630 Anorexia: Secondary | ICD-10-CM | POA: Diagnosis not present

## 2016-10-29 LAB — COMPREHENSIVE METABOLIC PANEL
ALBUMIN: 2.5 g/dL — AB (ref 3.5–5.0)
ALT: 7 U/L — ABNORMAL LOW (ref 14–54)
ANION GAP: 11 (ref 5–15)
AST: 15 U/L (ref 15–41)
Alkaline Phosphatase: 42 U/L (ref 38–126)
BILIRUBIN TOTAL: 0.7 mg/dL (ref 0.3–1.2)
BUN: 15 mg/dL (ref 6–20)
CO2: 27 mmol/L (ref 22–32)
Calcium: 8.6 mg/dL — ABNORMAL LOW (ref 8.9–10.3)
Chloride: 99 mmol/L — ABNORMAL LOW (ref 101–111)
Creatinine, Ser: 1.23 mg/dL — ABNORMAL HIGH (ref 0.44–1.00)
GFR calc Af Amer: 55 mL/min — ABNORMAL LOW (ref >60)
GFR, EST NON AFRICAN AMERICAN: 47 mL/min — AB (ref >60)
Glucose, Bld: 144 mg/dL — ABNORMAL HIGH (ref 65–99)
POTASSIUM: 2.7 mmol/L — AB (ref 3.5–5.1)
Sodium: 137 mmol/L (ref 135–145)
TOTAL PROTEIN: 6.9 g/dL (ref 6.5–8.1)

## 2016-10-29 LAB — CBC WITH DIFFERENTIAL/PLATELET
BASOS PCT: 1 %
Basophils Absolute: 0 10*3/uL (ref 0–0.1)
Eosinophils Absolute: 0.1 10*3/uL (ref 0–0.7)
Eosinophils Relative: 2 %
HEMATOCRIT: 28.3 % — AB (ref 35.0–47.0)
Hemoglobin: 8.8 g/dL — ABNORMAL LOW (ref 12.0–16.0)
Lymphocytes Relative: 17 %
Lymphs Abs: 0.5 10*3/uL — ABNORMAL LOW (ref 1.0–3.6)
MCH: 26.5 pg (ref 26.0–34.0)
MCHC: 31.1 g/dL — AB (ref 32.0–36.0)
MCV: 85.3 fL (ref 80.0–100.0)
MONO ABS: 0.1 10*3/uL — AB (ref 0.2–0.9)
MONOS PCT: 4 %
NEUTROS ABS: 2.3 10*3/uL (ref 1.4–6.5)
Neutrophils Relative %: 76 %
Platelets: 110 10*3/uL — ABNORMAL LOW (ref 150–440)
RBC: 3.32 MIL/uL — ABNORMAL LOW (ref 3.80–5.20)
RDW: 21.5 % — AB (ref 11.5–14.5)
WBC: 3 10*3/uL — ABNORMAL LOW (ref 3.6–11.0)

## 2016-10-29 LAB — URINALYSIS, ROUTINE W REFLEX MICROSCOPIC
Bilirubin Urine: NEGATIVE
Glucose, UA: NEGATIVE mg/dL
Hgb urine dipstick: NEGATIVE
Ketones, ur: NEGATIVE mg/dL
Leukocytes, UA: NEGATIVE
Nitrite: NEGATIVE
Protein, ur: NEGATIVE mg/dL
Specific Gravity, Urine: 1.015 (ref 1.005–1.030)
pH: 5 (ref 5.0–8.0)

## 2016-10-29 LAB — LACTIC ACID, PLASMA
LACTIC ACID, VENOUS: 1.9 mmol/L (ref 0.5–1.9)
LACTIC ACID, VENOUS: 0.8 mmol/L (ref 0.5–1.9)

## 2016-10-29 LAB — TSH: TSH: 2.238 u[IU]/mL (ref 0.350–4.500)

## 2016-10-29 MED ORDER — MORPHINE SULFATE (PF) 2 MG/ML IV SOLN
2.0000 mg | INTRAVENOUS | Status: DC | PRN
  Administered 2016-10-29 – 2016-11-02 (×4): 2 mg via INTRAVENOUS
  Filled 2016-10-29 (×5): qty 1

## 2016-10-29 MED ORDER — FUROSEMIDE 20 MG PO TABS
20.0000 mg | ORAL_TABLET | Freq: Every day | ORAL | Status: DC
  Administered 2016-10-29 – 2016-10-31 (×3): 20 mg via ORAL
  Filled 2016-10-29 (×3): qty 1

## 2016-10-29 MED ORDER — POTASSIUM CHLORIDE CRYS ER 10 MEQ PO TBCR
10.0000 meq | EXTENDED_RELEASE_TABLET | Freq: Every day | ORAL | Status: DC
  Administered 2016-10-29 – 2016-10-31 (×3): 10 meq via ORAL
  Filled 2016-10-29 (×3): qty 1

## 2016-10-29 MED ORDER — CALCIUM CARB-CHOLECALCIFEROL 600-800 MG-UNIT PO TABS: 1.0000 | ORAL_TABLET | Freq: Every day | ORAL | Status: DC

## 2016-10-29 MED ORDER — ACETAMINOPHEN 500 MG PO TABS: 1000.0000 mg | ORAL_TABLET | Freq: Four times a day (QID) | ORAL | Status: DC | PRN

## 2016-10-29 MED ORDER — ABEMACICLIB 150 MG PO TABS
150.0000 mg | ORAL_TABLET | Freq: Two times a day (BID) | ORAL | Status: DC
  Administered 2016-10-30 – 2016-11-02 (×7): 150 mg via ORAL
  Filled 2016-10-29: qty 14
  Filled 2016-10-29: qty 1
  Filled 2016-10-29 (×6): qty 14

## 2016-10-29 MED ORDER — ONDANSETRON HCL 4 MG/2ML IJ SOLN: 4.0000 mg | Freq: Four times a day (QID) | INTRAMUSCULAR | Status: DC | PRN

## 2016-10-29 MED ORDER — ONDANSETRON HCL 4 MG PO TABS: 4.0000 mg | ORAL_TABLET | Freq: Four times a day (QID) | ORAL | Status: DC | PRN

## 2016-10-29 MED ORDER — LORATADINE 10 MG PO TABS
10.0000 mg | ORAL_TABLET | Freq: Every day | ORAL | Status: DC
  Administered 2016-10-29 – 2016-11-03 (×6): 10 mg via ORAL
  Filled 2016-10-29 (×6): qty 1

## 2016-10-29 MED ORDER — VANCOMYCIN HCL 10 G IV SOLR
1250.0000 mg | Freq: Two times a day (BID) | INTRAVENOUS | Status: DC
  Administered 2016-10-29 (×2): 1250 mg via INTRAVENOUS
  Filled 2016-10-29 (×4): qty 1250

## 2016-10-29 MED ORDER — DOCUSATE SODIUM 100 MG PO CAPS
100.0000 mg | ORAL_CAPSULE | Freq: Two times a day (BID) | ORAL | Status: DC
  Administered 2016-10-29 – 2016-10-31 (×6): 100 mg via ORAL
  Filled 2016-10-29 (×8): qty 1

## 2016-10-29 MED ORDER — HYDROCODONE-ACETAMINOPHEN 5-325 MG PO TABS
1.0000 | ORAL_TABLET | Freq: Four times a day (QID) | ORAL | Status: DC | PRN
  Administered 2016-10-29 – 2016-11-01 (×5): 1 via ORAL
  Filled 2016-10-29 (×6): qty 1

## 2016-10-29 MED ORDER — LISINOPRIL 20 MG PO TABS
20.0000 mg | ORAL_TABLET | Freq: Every day | ORAL | Status: DC
  Administered 2016-10-29 – 2016-10-30 (×2): 20 mg via ORAL
  Filled 2016-10-29 (×3): qty 1

## 2016-10-29 MED ORDER — POTASSIUM CHLORIDE IN NACL 40-0.9 MEQ/L-% IV SOLN
INTRAVENOUS | Status: DC
  Administered 2016-10-29: 07:00:00 125 mL/h via INTRAVENOUS
  Administered 2016-10-30: 09:00:00 75 mL/h via INTRAVENOUS
  Filled 2016-10-29 (×5): qty 1000

## 2016-10-29 MED ORDER — SODIUM CHLORIDE 0.9 % IV BOLUS (SEPSIS)
1000.0000 mL | Freq: Once | INTRAVENOUS | Status: AC
  Administered 2016-10-29: 1000 mL via INTRAVENOUS

## 2016-10-29 MED ORDER — PIPERACILLIN-TAZOBACTAM 3.375 G IVPB 30 MIN
3.3750 g | Freq: Once | INTRAVENOUS | Status: AC
  Administered 2016-10-29: 3.375 g via INTRAVENOUS
  Filled 2016-10-29: qty 50

## 2016-10-29 MED ORDER — METOPROLOL SUCCINATE ER 50 MG PO TB24
50.0000 mg | ORAL_TABLET | Freq: Two times a day (BID) | ORAL | Status: DC
  Administered 2016-10-29 – 2016-10-31 (×5): 50 mg via ORAL
  Filled 2016-10-29 (×5): qty 1

## 2016-10-29 MED ORDER — K PHOS MONO-SOD PHOS DI & MONO 155-852-130 MG PO TABS
250.0000 mg | ORAL_TABLET | Freq: Two times a day (BID) | ORAL | Status: DC
  Administered 2016-10-29 – 2016-11-02 (×9): 250 mg via ORAL
  Filled 2016-10-29 (×10): qty 1

## 2016-10-29 MED ORDER — CALCIUM CARBONATE-VITAMIN D 500-200 MG-UNIT PO TABS
1.0000 | ORAL_TABLET | Freq: Every day | ORAL | Status: DC
  Administered 2016-10-29 – 2016-11-03 (×6): 1 via ORAL
  Filled 2016-10-29 (×6): qty 1

## 2016-10-29 MED ORDER — ABEMACICLIB 150 MG PO TABS: 150.0000 mg | ORAL_TABLET | Freq: Two times a day (BID) | ORAL | Status: DC

## 2016-10-29 MED ORDER — OMEGA-3-ACID ETHYL ESTERS 1 G PO CAPS
1.0000 g | ORAL_CAPSULE | Freq: Every day | ORAL | Status: DC
  Administered 2016-10-29 – 2016-11-02 (×5): 1 g via ORAL
  Filled 2016-10-29 (×7): qty 1

## 2016-10-29 MED ORDER — APIXABAN 2.5 MG PO TABS
5.0000 mg | ORAL_TABLET | Freq: Two times a day (BID) | ORAL | Status: DC
  Administered 2016-10-29 – 2016-11-03 (×11): 5 mg via ORAL
  Filled 2016-10-29 (×11): qty 2

## 2016-10-29 MED ORDER — ENSURE ENLIVE PO LIQD
237.0000 mL | Freq: Two times a day (BID) | ORAL | Status: DC
  Administered 2016-10-29 – 2016-10-30 (×3): 237 mL via ORAL

## 2016-10-29 MED ORDER — POTASSIUM CHLORIDE 10 MEQ/100ML IV SOLN
10.0000 meq | INTRAVENOUS | Status: AC
  Administered 2016-10-29 (×4): 10 meq via INTRAVENOUS
  Filled 2016-10-29 (×4): qty 100

## 2016-10-29 MED ORDER — PIPERACILLIN-TAZOBACTAM 3.375 G IVPB
3.3750 g | Freq: Three times a day (TID) | INTRAVENOUS | Status: DC
  Administered 2016-10-29: 15:00:00 3.375 g via INTRAVENOUS
  Filled 2016-10-29 (×5): qty 50

## 2016-10-29 MED ORDER — VANCOMYCIN HCL IN DEXTROSE 1-5 GM/200ML-% IV SOLN
1000.0000 mg | Freq: Once | INTRAVENOUS | Status: AC
  Administered 2016-10-29: 1000 mg via INTRAVENOUS
  Filled 2016-10-29: qty 200

## 2016-10-29 MED ORDER — ATORVASTATIN CALCIUM 20 MG PO TABS
40.0000 mg | ORAL_TABLET | Freq: Every day | ORAL | Status: DC
  Administered 2016-10-29 – 2016-11-02 (×5): 40 mg via ORAL
  Filled 2016-10-29 (×5): qty 2

## 2016-10-29 NOTE — ED Notes (Signed)
[name], ed secretary notified of code sepsis and need to call code sepsis.

## 2016-10-29 NOTE — ED Triage Notes (Signed)
Pt states that she had something burst on her arm. Pt presents POV to ED with weeping abscess. Pt has multiple abscesses to right arm and breast. Pt is in NAD at this time.

## 2016-10-29 NOTE — Consult Note (Signed)
[facility]  Date of admission:  10/29/2016  Inpatient day:  10/29/2016  Consulting physician:  Dr. Rosilyn Mings   Reason for Consultation:  Patient of Dr. Rogue Bussing stage 4 breast cancer on palliative radiation therapy now with sepsis secondary to avascular arm  Chief Complaint: [name] is a 61 y.o. female with stage IV breast cancer who was admitted through the ER with sepsis.  HPI:  The patient has a history of breast cancer. She is status post multiple lines of therapy.  She received palliative radiation to the right shoulder (completed 10/06/2016).   Approximately 3 weeks ago, she began Faslodex and Verzenio (abemaciclib).  She was last seen in the medical oncology clinic on 10/19/2016.  At that time, her right breast lesion was ulcerated/scabbed without drainage. There was foul smelling in discharge from the right shoulder posterior ulcerated mass.    The patient presented to the emergency room with significant right upper back bleeding and purulent drainage.  She denied any history of fever but noted "a little chills".  She denies any diarrhea.  Appetite is poor.    She is on Eliquis for a pulmonary embolism.   Past Medical History:  Diagnosis Date  . Anemia   . Anginal pain ([place])   . Breast cancer ([place])    2014   . Breast cancer metastasized to bone ([place]) 11/21/2014  . Hypertension   . Last menstrual period (LMP) > 10 days ago 2007  . Lymphedema of right upper extremity   . Myocardial infarction ([place])   . Post-menopausal 2006  . Sleep apnea   . Wound drainage     Past Surgical History:  Procedure Laterality Date  . CORONARY ANGIOPLASTY WITH STENT PLACEMENT N/A   . HERNIA REPAIR     umbilical hernia repair  . PORTACATH PLACEMENT Right 02/03/2015   Procedure: INSERTION PORT-A-CATH;  Surgeon: [name], MD;  Location: [facility] ORS;  Service: General;  Laterality: Right;    Family History  Problem Relation Age of Onset  . Atrial  fibrillation Sister   . Hypertension Sister     Social History:  reports that she quit smoking about 12 years ago. Her smoking use included Cigarettes. She smoked 1.00 pack per day. She has never used smokeless tobacco. She reports that she does not drink alcohol or use drugs.  The patient is accompanied by her husband.  Allergies:  Allergies  Allergen Reactions  . No Known Allergies     Medications Prior to Admission  Medication Sig Dispense Refill  . Abemaciclib 150 MG TABS Take 150 mg by mouth every 12 (twelve) hours. 60 tablet 6  . acetaminophen (TYLENOL) 500 MG tablet Take 1,000 mg by mouth every 6 (six) hours as needed.    . [facility] CALCIUM 600+D 600-800 MG-UNIT TABS Take 1 tablet by mouth daily. At noon  3  . ELIQUIS 5 MG TABS tablet TAKE 1 TABLET BY MOUTH TWICE DAILY 180 tablet PRN  . fexofenadine (ALLEGRA) 180 MG tablet Take 180 mg by mouth daily.     . furosemide (LASIX) 20 MG tablet Take 20 mg by mouth daily.  1  . gabapentin (NEURONTIN) 300 MG capsule TAKE 1 CAPSULE BY MOUTH ONCE DAILY IN THE MORNING AND TAKE 2 CAPSULES BY MOUTH IN THE EVENING 90 capsule PRN  . HYDROcodone-acetaminophen (NORCO) 5-325 MG tablet Take 1 tablet by mouth every 6 (six) hours as needed for moderate pain (every 6 to 8 hrs prn pain). 60 tablet  0  . lisinopril (PRINIVIL,ZESTRIL) 20 MG tablet Take 20 mg by mouth daily.    . metoprolol succinate (TOPROL-XL) 50 MG 24 hr tablet Take 50 mg by mouth 2 (two) times daily. Take with or immediately following a meal.    . omega-3 acid ethyl esters (LOVAZA) 1 G capsule Take 1 g by mouth at bedtime.    . phosphorus (PHOSPHA 250 NEUTRAL) 155-852-130 MG tablet Take 1 tablet (250 mg total) by mouth 2 (two) times daily. 180 tablet 1  . potassium chloride (K-DUR) 10 MEQ tablet Take 10 mEq by mouth daily.   1  . simvastatin (ZOCOR) 80 MG tablet Take 80 mg by mouth at bedtime.    . ondansetron (ZOFRAN) 8 MG tablet TAKE 1 TABLET (8MG) BY MOUTH TWICE DAILY START THE DAY AFTER  CHEMOTHERAPY FOR 2 DAYS THEN TAKE AS NEEDED FOR NAUSEA OR VOMITING (Patient not taking: Reported on 10/19/2016) 30 tablet 0    Review of Systems: GENERAL:  Fatigue.  No fevers.  Chills.  No sweats. PERFORMANCE STATUS (ECOG):  3 HEENT:  No visual changes, runny nose, sore throat, mouth sores or tenderness. Lungs:  No shortness of breath or cough.  No hemoptysis. Cardiac:  No chest pain, palpitations, orthopnea, or PND. GI:  Poor appetite.  No nausea, vomiting, diarrhea, constipation, melena or hematochezia. GU:  No urgency, frequency, dysuria, or hematuria. Musculoskeletal:  No back pain.  No joint pain.  No muscle tenderness. Extremities: Unable to move right arm (chronic) with significant swelling. Skin:  No rashes or skin changes. Neuro:  Decreased movement right upper extremity (old).  No headache, balance or coordination issues. Endocrine:  No diabetes, thyroid issues, hot flashes or night sweats. Psych:  No mood changes, depression or anxiety. Pain:  Pain well controlled. Review of systems:  All other systems reviewed and found to be negative.  Physical Exam:  Blood pressure (!) 113/58, pulse 99, temperature 99.4 F (37.4 C), temperature source Oral, resp. rate 20, height 5' 5" (1.651 m), weight 296 lb 8 oz (134.5 kg), SpO2 100 %.  GENERAL:  Chronically ill appearing overweight woman lying in bed on the medical unit in no acute distress.  Her right shoulder and chest wall are bandaged.  She has a chuck behind her right shoulder with significant foul smelling serosanguinous discharge. MENTAL STATUS:  Alert and oriented to person, place and time. HEAD:  Wearing a gray/white cap.  Grayhair.  Normocephalic, atraumatic, face symmetric, no Cushingoid features. EYES:  Pupils equal round and reactive to light and accomodation.  No conjunctivitis or scleral icterus. ENT:  Oropharynx clear without lesion.  Tongue normal. Mucous membranes moist.  RESPIRATORY:  Clear to auscultation without  rales, wheezes or rhonchi. CARDIOVASCULAR:  Regular rate and rhythm without murmur, rub or gallop. ABDOMEN:  Soft, non-tender, with active bowel sounds, and no hepatosplenomegaly.  No masses. SKIN:  2-2.5 cm thick oval eschar right anterior shoulder.  Right axillary and breast ulcerated lesions.  Right upper back/scapula region with deep ulcerations and foul smelling purulent material. EXTREMITIES: Right upper extremity with massive lymphedema sleeve.  No lower extremity edema or palpable cords. LYMPH NODES: No palpable cervical, supraclavicular, axillary or inguinal adenopathy  NEUROLOGICAL: Unable to move right arm. PSYCH:  Appropriate.   Results for orders placed or performed during the hospital encounter of 10/29/16 (from the past 48 hour(s))  CBC with Differential     Status: Abnormal   Collection Time: 10/29/16  1:40 AM  Result Value Ref Range  WBC 3.0 (L) 3.6 - 11.0 K/uL   RBC 3.32 (L) 3.80 - 5.20 MIL/uL   Hemoglobin 8.8 (L) 12.0 - 16.0 g/dL   HCT 28.3 (L) 35.0 - 47.0 %   MCV 85.3 80.0 - 100.0 fL   MCH 26.5 26.0 - 34.0 pg   MCHC 31.1 (L) 32.0 - 36.0 g/dL   RDW 21.5 (H) 11.5 - 14.5 %   Platelets 110 (L) 150 - 440 K/uL   Neutrophils Relative % 76 %   Neutro Abs 2.3 1.4 - 6.5 K/uL   Lymphocytes Relative 17 %   Lymphs Abs 0.5 (L) 1.0 - 3.6 K/uL   Monocytes Relative 4 %   Monocytes Absolute 0.1 (L) 0.2 - 0.9 K/uL   Eosinophils Relative 2 %   Eosinophils Absolute 0.1 0 - 0.7 K/uL   Basophils Relative 1 %   Basophils Absolute 0.0 0 - 0.1 K/uL  Comprehensive metabolic panel     Status: Abnormal   Collection Time: 10/29/16  1:40 AM  Result Value Ref Range   Sodium 137 135 - 145 mmol/L   Potassium 2.7 (LL) 3.5 - 5.1 mmol/L    Comment: CRITICAL RESULT CALLED TO, READ BACK BY AND VERIFIED WITH MICHELE MORTON AT 0214 10/29/16.RWW/PMH   Chloride 99 (L) 101 - 111 mmol/L   CO2 27 22 - 32 mmol/L   Glucose, Bld 144 (H) 65 - 99 mg/dL   BUN 15 6 - 20 mg/dL   Creatinine, Ser 1.23 (H) 0.44  - 1.00 mg/dL   Calcium 8.6 (L) 8.9 - 10.3 mg/dL   Total Protein 6.9 6.5 - 8.1 g/dL   Albumin 2.5 (L) 3.5 - 5.0 g/dL   AST 15 15 - 41 U/L   ALT 7 (L) 14 - 54 U/L   Alkaline Phosphatase 42 38 - 126 U/L   Total Bilirubin 0.7 0.3 - 1.2 mg/dL   GFR calc non Af Amer 47 (L) >60 mL/min   GFR calc Af Amer 55 (L) >60 mL/min    Comment: (NOTE) The eGFR has been calculated using the CKD EPI equation. This calculation has not been validated in all clinical situations. eGFR's persistently <60 mL/min signify possible Chronic Kidney Disease.    Anion gap 11 5 - 15  Lactic acid, plasma     Status: None   Collection Time: 10/29/16  1:40 AM  Result Value Ref Range   Lactic Acid, Venous 1.9 0.5 - 1.9 mmol/L  Blood culture (routine x 2)     Status: None (Preliminary result)   Collection Time: 10/29/16  1:40 AM  Result Value Ref Range   Specimen Description BLOOD LEFT ANTECUBITAL    Special Requests      BOTTLES DRAWN AEROBIC AND ANAEROBIC Blood Culture results may not be optimal due to an excessive volume of blood received in culture bottles   Culture NO GROWTH < 12 HOURS    Report Status PENDING   Blood culture (routine x 2)     Status: None (Preliminary result)   Collection Time: 10/29/16  2:40 AM  Result Value Ref Range   Specimen Description BLOOD LEFT HAND    Special Requests      BOTTLES DRAWN AEROBIC AND ANAEROBIC Blood Culture adequate volume   Culture NO GROWTH < 12 HOURS    Report Status PENDING   Type and screen [place]     Status: None   Collection Time: 10/29/16  3:13 AM  Result Value Ref Range   ABO/RH(D) A  POS    Antibody Screen NEG    Sample Expiration 11/01/2016   Lactic acid, plasma     Status: None   Collection Time: 10/29/16  4:23 AM  Result Value Ref Range   Lactic Acid, Venous 0.8 0.5 - 1.9 mmol/L  Urinalysis, Routine w reflex microscopic     Status: Abnormal   Collection Time: 10/29/16  6:30 AM  Result Value Ref Range   Color, Urine YELLOW  (A) YELLOW   APPearance HAZY (A) CLEAR   Specific Gravity, Urine 1.015 1.005 - 1.030   pH 5.0 5.0 - 8.0   Glucose, UA NEGATIVE NEGATIVE mg/dL   Hgb urine dipstick NEGATIVE NEGATIVE   Bilirubin Urine NEGATIVE NEGATIVE   Ketones, ur NEGATIVE NEGATIVE mg/dL   Protein, ur NEGATIVE NEGATIVE mg/dL   Nitrite NEGATIVE NEGATIVE   Leukocytes, UA NEGATIVE NEGATIVE  TSH     Status: None   Collection Time: 10/29/16  7:22 AM  Result Value Ref Range   TSH 2.238 0.350 - 4.500 uIU/mL    Comment: Performed by a 3rd Generation assay with a functional sensitivity of <=0.01 uIU/mL.   No results found.  Assessment:  The patient is a 61 y.o. woman with stage IV breast cancer who was admitted with sepsis.  The source of infection is the ulcerated right anterior and posterior chest wall lesions draining purulent material.   She has been pan-cultured and started on vancomycin and Zosyn.  Plan:   1.  Oncology:  Patient receiving palliative treatment for stage IV breast cancer.  She is currently receiving Faslodex and Verzenio (abemaciclib).  She recently completed palliative radiation to her right shoulder.  Anticipate follow-up imaging in the outpatient department to assess response to disease.  Continue abemaciclib.  Treatment options are limited.  2.  Hematology:  Counts adequate.  Bleeding from open ulcerated lesions appears minimal.  Continue Eliquis for a history of pulmonary embolism.  Patient at high risk for recurrent thrombosis given her malignancy, weight, and sedentary activity.  Follow counts daily.    3.  Infectious disease:  Patient admitted with sepsis.  She is on broad spectrum antibiotics (vancomycin and Zosyn).  Wound care consult.   Thank you for allowing me to participate in LATOIA EYSTER 's care.  I will follow her closely with you until Dr. Aletha Halim return on 10/31/2016.  Lequita Asal, MD  10/29/2016, 12:07 PM

## 2016-10-29 NOTE — ED Notes (Signed)
Pt with multiple tunneling wounds noted to left upper lateral arm. Pt has tunneling wound noted beneath right shoulder, wounds are difficult to approximate due to exudate hardened on exterior. Pt with tunneling wound noted to posterior lateral upper right back that has purulent drainage at a slow constant trickle. Pt with left lateral mid arm wound circular in nature side by side. Right one has dried sanginous drainage noted. Pt denies pain, strong odor noted. Pt denies known fever, but states has had chills. Pt with history of breast cancer, pt states she is taking oral chemotherapy.

## 2016-10-29 NOTE — ED Notes (Signed)
Wells Guiles, rn was not able to aspirate blood from port access.

## 2016-10-29 NOTE — Progress Notes (Signed)
Pharmacy Antibiotic Note  [name] is a 61 y.o. female admitted on 10/29/2016 with sepsis.  Pharmacy has been consulted for vancomycin and Zosyn dosing.  Plan: DW 89kg  Vd 62L kei 0.063 hr-1  t1/2 11 hours Vancomycin 1250 mg q 12 hours ordered with stacked dosing. Level before 5th dose. Goal trough 15-20.  Zosyn 3.375 grams q 8 hours ordered.  Height: 5\' 5"  (165.1 cm) Weight: (!) 305 lb (138.3 kg) IBW/kg (Calculated) : 57  Temp (24hrs), Avg:99.1 F (37.3 C), Min:99.1 F (37.3 C), Max:99.1 F (37.3 C)   Recent Labs Lab 10/29/16 0140 10/29/16 0423  WBC 3.0*  --   CREATININE 1.23*  --   LATICACIDVEN 1.9 0.8    Estimated Creatinine Clearance: 70.4 mL/min (A) (by C-G formula based on SCr of 1.23 mg/dL (H)).    Allergies  Allergen Reactions  . No Known Allergies     Antimicrobials this admission: vancomycin Zosyn 5/5 >>    >>   Dose adjustments this admission:   Microbiology results: 5/5 BCx: pending      5/5 UA: pending  Thank you for allowing pharmacy to be a part of this patient's care.  Charliegh Vasudevan S 10/29/2016 5:24 AM

## 2016-10-29 NOTE — ED Notes (Signed)
Unable to obtain second set of blood cultures. Spoke with irene in lab regarding need for venipuncture assistance for blood cultures. Murray Hodgkins to send phlebotomist.

## 2016-10-29 NOTE — ED Provider Notes (Signed)
[facility] Emergency Department Provider Note   ____________________________________________   First MD Initiated Contact with Patient 10/29/16 [phone]     (approximate)  I have reviewed the triage vital signs and the nursing notes.   HISTORY  Chief Complaint Abscess    HPI [name] is a 61 y.o. female who comes into the hospital todaywith some bleeding on her right side. The patient is unsure exactly where she is bleeding from. The patient has a history of breast cancer and it is invading through her chest wall as well as her breast and scapula. The patient reports that the bleeding started around 12:30 when they were getting ready for bed. She reports it was everywhere. Typically the patient wears a compression sleeve on the right side but is covered in blood. The patient sees her oncologist here at [facility]. She is currently taking oral chemotherapy and the shot. The patient has never had bleeding like this in the past. She states that she did have radiation about 3 weeks ago. Today the patient's temperature is 99.9. She did have some chills but denies shortness of breath and pain at this time. She is here today for evaluation. The what else to do so she came in to get this bleeding under control.   Past Medical History:  Diagnosis Date  . Anemia   . Anginal pain ([place])   . Breast cancer ([place])    2014   . Breast cancer metastasized to bone ([place]) 11/21/2014  . Hypertension   . Last menstrual period (LMP) > 10 days ago 2007  . Lymphedema of right upper extremity   . Myocardial infarction ([place])   . Post-menopausal 2006  . Sleep apnea   . Wound drainage     Patient Active Problem List   Diagnosis Date Noted  . Sepsis ([place]) 10/29/2016  . Counseling regarding goals of care 10/03/2016  . Portacath in place 09/12/2016  . Carcinoma of overlapping sites of right breast in female, estrogen receptor positive ([place]) 04/11/2016  . Pulmonary emboli ([place])  02/08/2016  . Metastasis to bone ([place]) 12/09/2015  . 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 Lymphedema of upper extremity 12/09/2015  . Last menstrual period (LMP) > 10 days ago   . Post-menopausal   . CAD in native artery 01/28/2015  . Diabetes mellitus, type 2 ([place]) 01/28/2015  . Obstructive apnea 01/28/2015  . Type 2 diabetes mellitus ([place]) 01/28/2015  . Breast cancer metastasized to bone ([place]) 11/21/2014    Past Surgical History:  Procedure Laterality Date  . CORONARY ANGIOPLASTY WITH STENT PLACEMENT N/A   . HERNIA REPAIR     umbilical hernia repair  . PORTACATH PLACEMENT Right 02/03/2015   Procedure: INSERTION PORT-A-CATH;  Surgeon: [name], MD;  Location: [facility] ORS;  Service: General;  Laterality: Right;    Prior to Admission medications   Medication Sig Start Date End Date Taking? Authorizing Provider  Abemaciclib 150 MG TABS Take 150 mg by mouth every 12 (twelve) hours. 09/12/16  Yes Cammie Sickle, MD  acetaminophen (TYLENOL) 500 MG tablet Take 1,000 mg by mouth every 6 (six) hours as needed.   Yes Historical Provider, MD  [employer] CALCIUM 600+D 600-800 MG-UNIT TABS Take 1 tablet by mouth daily. At noon 10/03/14  Yes Historical Provider, MD  ELIQUIS 5 MG TABS tablet TAKE 1 TABLET BY MOUTH TWICE DAILY 10/13/16  Yes Cammie Sickle, MD  fexofenadine (ALLEGRA) 180 MG tablet Take 180 mg by mouth daily.  Yes Historical Provider, MD  furosemide (LASIX) 20 MG tablet Take 20 mg by mouth daily. 10/17/14  Yes Historical Provider, MD  gabapentin (NEURONTIN) 300 MG capsule TAKE 1 CAPSULE BY MOUTH ONCE DAILY IN THE MORNING AND TAKE 2 CAPSULES BY MOUTH IN THE EVENING 09/06/16  Yes Cammie Sickle, MD  HYDROcodone-acetaminophen (NORCO) 5-325 MG tablet Take 1 tablet by mouth every 6 (six) hours as needed for moderate pain (every 6 to 8 hrs prn pain). 10/19/16  Yes Cammie Sickle, MD  lisinopril (PRINIVIL,ZESTRIL) 20 MG tablet Take 20 mg by mouth daily.   Yes  Historical Provider, MD  metoprolol succinate (TOPROL-XL) 50 MG 24 hr tablet Take 50 mg by mouth 2 (two) times daily. Take with or immediately following a meal.   Yes Historical Provider, MD  omega-3 acid ethyl esters (LOVAZA) 1 G capsule Take 1 g by mouth at bedtime.   Yes Historical Provider, MD  phosphorus (PHOSPHA 250 NEUTRAL) 155-852-130 MG tablet Take 1 tablet (250 mg total) by mouth 2 (two) times daily. 08/15/16  Yes Cammie Sickle, MD  potassium chloride (K-DUR) 10 MEQ tablet Take 10 mEq by mouth daily.  10/17/14  Yes Historical Provider, MD  simvastatin (ZOCOR) 80 MG tablet Take 80 mg by mouth at bedtime.   Yes Historical Provider, MD  ondansetron (ZOFRAN) 8 MG tablet TAKE 1 TABLET (8MG ) BY MOUTH TWICE DAILY START THE DAY AFTER CHEMOTHERAPY FOR 2 DAYS THEN TAKE AS NEEDED FOR NAUSEA OR VOMITING Patient not taking: Reported on 10/19/2016 12/21/15   Cammie Sickle, MD    Allergies No known allergies  Family History  Problem Relation Age of Onset  . Atrial fibrillation Sister   . Hypertension Sister     Social History Social History  Substance Use Topics  . Smoking status: Former Smoker    Packs/day: 1.00    Types: Cigarettes    Quit date: 10/25/2004  . Smokeless tobacco: Never Used     Comment: stopped smoking greater than 7 years ago  . Alcohol use No    Review of Systems  Constitutional: chills Eyes: No visual changes. ENT: No sore throat. Cardiovascular: Denies chest pain. Respiratory: Denies shortness of breath. Gastrointestinal: No abdominal pain.  No nausea, no vomiting.  No diarrhea.  No constipation. Genitourinary: Negative for dysuria. Musculoskeletal: Negative for back pain. Skin: bleeding skin wound Neurological: Negative for headaches, focal weakness or numbness.   ____________________________________________   PHYSICAL EXAM:  VITAL SIGNS: ED Triage Vitals  Enc Vitals Group     BP 10/29/16 0119 115/74     Pulse Rate 10/29/16 0119 (!) 122       Resp 10/29/16 0119 20     Temp 10/29/16 0119 99.1 F (37.3 C)     Temp Source 10/29/16 0119 Oral     SpO2 10/29/16 0119 100 %     Weight 10/29/16 0115 (!) 305 lb (138.3 kg)     Height 10/29/16 0115 5\' 5"  (1.651 m)     Head Circumference --      Peak Flow --      Pain Score 10/29/16 0115 0     Pain Loc --      Pain Edu? --      Excl. in [place]? --     Constitutional: Alert and oriented. Ill appearing and in mild distress. Eyes: Conjunctivae are normal. PERRL. EOMI. Head: Atraumatic. Nose: No congestion/rhinnorhea. Mouth/Throat: Mucous membranes are moist.  Oropharynx non-erythematous. Cardiovascular: Tachycardia, regular rhythm. Grossly normal heart  sounds.  Good peripheral circulation. Respiratory: Normal respiratory effort.  No retractions. Lungs CTAB. Gastrointestinal: Soft and nontender. No distention. Positive bowel sounds Musculoskeletal: Swelling to right upper extremity patient reports unable to move for a long time. Neurologic:  Normal speech and language.  Skin:  Open wounds to the patient's arm on the bicep area as well as on the tricep area with some drainage. Patient has a large defect at her shoulder above the scapula with some green drainage.Marland Kitchen Psychiatric: Mood and affect are normal. Speech and behavior are normal.  ____________________________________________   LABS (all labs ordered are listed, but only abnormal results are displayed)  Labs Reviewed  CBC WITH DIFFERENTIAL/PLATELET - Abnormal; Notable for the following:       Result Value   WBC 3.0 (*)    RBC 3.32 (*)    Hemoglobin 8.8 (*)    HCT 28.3 (*)    MCHC 31.1 (*)    RDW 21.5 (*)    Platelets 110 (*)    Lymphs Abs 0.5 (*)    Monocytes Absolute 0.1 (*)    All other components within normal limits  COMPREHENSIVE METABOLIC PANEL - Abnormal; Notable for the following:    Potassium 2.7 (*)    Chloride 99 (*)    Glucose, Bld 144 (*)    Creatinine, Ser 1.23 (*)    Calcium 8.6 (*)    Albumin 2.5 (*)     ALT 7 (*)    GFR calc non Af Amer 47 (*)    GFR calc Af Amer 55 (*)    All other components within normal limits  CULTURE, BLOOD (ROUTINE X 2)  CULTURE, BLOOD (ROUTINE X 2)  LACTIC ACID, PLASMA  LACTIC ACID, PLASMA  URINALYSIS, ROUTINE W REFLEX MICROSCOPIC  TYPE AND SCREEN   ____________________________________________  EKG  ED ECG REPORT I, Loney Hering, the attending physician, personally viewed and interpreted this ECG.   Date: 10/29/2016  EKG Time: 357  Rate: 116  Rhythm: sinus tachycardia  Axis: normal  Intervals:prolonged qtc  ST&T Change: Diffuse ST depressions.  ____________________________________________  RADIOLOGY  none ____________________________________________   PROCEDURES  Procedure(s) performed: None  Procedures  Critical Care performed: Yes, see critical care note(s)  CRITICAL CARE Performed by: Charlesetta Ivory P   Total critical care time: 30 minutes  Critical care time was exclusive of separately billable procedures and treating other patients.  Critical care was necessary to treat or prevent imminent or life-threatening deterioration.  Critical care was time spent personally by me on the following activities: development of treatment plan with patient and/or surrogate as well as nursing, discussions with consultants, evaluation of patient's response to treatment, examination of patient, obtaining history from patient or surrogate, ordering and performing treatments and interventions, ordering and review of laboratory studies, ordering and review of radiographic studies, pulse oximetry and re-evaluation of patient's condition.  ____________________________________________   INITIAL IMPRESSION / ASSESSMENT AND PLAN / ED COURSE  Pertinent labs & imaging results that were available during my care of the patient were reviewed by me and considered in my medical decision making (see chart for details).  This is a 61 year old who  comes into the hospital today with some bleeding on her right side. After evaluating the patient it is evident that she has some invasive breast cancer that is eroding at the skin of her arm, shoulder girdle and chest. These wounds are also purulent and draining. I did call a code sepsis as the patient is  tachycardic with a low-grade temperature. The patient did receive vancomycin and Zosyn for skin and wound infections. The patient is on L [place] for a PE but she is not actively bleeding at this time. The patient has some low white blood cell count as well as hypokalemia. I will admit the patient to the hospitalist service for further treatment and evaluation of her symptoms.      ____________________________________________   FINAL CLINICAL IMPRESSION(S) / ED DIAGNOSES  Final diagnoses:  Wound infection  Bleeding from wound  Sepsis, due to unspecified organism ([place])  Hypokalemia      NEW MEDICATIONS STARTED DURING THIS VISIT:  New Prescriptions   No medications on file     Note:  This document was prepared using Dragon voice recognition software and may include unintentional dictation errors.    Loney Hering, MD 10/29/16 [phone]

## 2016-10-29 NOTE — ED Notes (Signed)
Dr. Dahlia Client notified of critical potassium 2.7. No new orders received.

## 2016-10-29 NOTE — H&P (Signed)
[name] is an 61 y.o. female.   Chief Complaint: Draining wounds HPI: The patient with past medical history of stage IV breast cancer currently undergoing palliative radiation therapy and receiving oral chemotherapy presents to the emergency department complaining of draining wounds. The patient has had multiple deep wounds secondary to fungating masses on her right arm and right chest wall. She reports removing a compression sleeve on her right arm which she has not been able to move for approximately one year. When she did so she developed significant bleeding from her right upper extremity wounds and chest wall. She has also noticed a significant amount of purulent drainage from the wounds. Laboratory evaluation in the emergency department revealed anemia, leukopenia, hypokalemia and acute kidney injury. The patient was also very tachycardic and occasionally tachypneic which prompted the emergency department staff to initiate sepsis protocol prior to calling the hospitalist service for admission.  Past Medical History:  Diagnosis Date  . Anemia   . Anginal pain ([place])   . Breast cancer ([place])    2014   . Breast cancer metastasized to bone ([place]) 11/21/2014  . Hypertension   . Last menstrual period (LMP) > 10 days ago 2007  . Lymphedema of right upper extremity   . Myocardial infarction ([place])   . Post-menopausal 2006  . Sleep apnea   . Wound drainage     Past Surgical History:  Procedure Laterality Date  . CORONARY ANGIOPLASTY WITH STENT PLACEMENT N/A   . HERNIA REPAIR     umbilical hernia repair  . PORTACATH PLACEMENT Right 02/03/2015   Procedure: INSERTION PORT-A-CATH;  Surgeon: [name], MD;  Location: [facility] ORS;  Service: General;  Laterality: Right;    Family History  Problem Relation Age of Onset  . Atrial fibrillation Sister   . Hypertension Sister    Social History:  reports that she quit smoking about 12 years ago. Her smoking use included Cigarettes. She  smoked 1.00 pack per day. She has never used smokeless tobacco. She reports that she does not drink alcohol or use drugs.  Allergies:  Allergies  Allergen Reactions  . No Known Allergies     Medications Prior to Admission  Medication Sig Dispense Refill  . Abemaciclib 150 MG TABS Take 150 mg by mouth every 12 (twelve) hours. 60 tablet 6  . acetaminophen (TYLENOL) 500 MG tablet Take 1,000 mg by mouth every 6 (six) hours as needed.    . [employer] CALCIUM 600+D 600-800 MG-UNIT TABS Take 1 tablet by mouth daily. At noon  3  . ELIQUIS 5 MG TABS tablet TAKE 1 TABLET BY MOUTH TWICE DAILY 180 tablet PRN  . fexofenadine (ALLEGRA) 180 MG tablet Take 180 mg by mouth daily.     . furosemide (LASIX) 20 MG tablet Take 20 mg by mouth daily.  1  . gabapentin (NEURONTIN) 300 MG capsule TAKE 1 CAPSULE BY MOUTH ONCE DAILY IN THE MORNING AND TAKE 2 CAPSULES BY MOUTH IN THE EVENING 90 capsule PRN  . HYDROcodone-acetaminophen (NORCO) 5-325 MG tablet Take 1 tablet by mouth every 6 (six) hours as needed for moderate pain (every 6 to 8 hrs prn pain). 60 tablet 0  . lisinopril (PRINIVIL,ZESTRIL) 20 MG tablet Take 20 mg by mouth daily.    . metoprolol succinate (TOPROL-XL) 50 MG 24 hr tablet Take 50 mg by mouth 2 (two) times daily. Take with or immediately following a meal.    . omega-3 acid ethyl esters (LOVAZA) 1 G capsule Take  1 g by mouth at bedtime.    . phosphorus (PHOSPHA 250 NEUTRAL) 155-852-130 MG tablet Take 1 tablet (250 mg total) by mouth 2 (two) times daily. 180 tablet 1  . potassium chloride (K-DUR) 10 MEQ tablet Take 10 mEq by mouth daily.   1  . simvastatin (ZOCOR) 80 MG tablet Take 80 mg by mouth at bedtime.    . ondansetron (ZOFRAN) 8 MG tablet TAKE 1 TABLET (8MG) BY MOUTH TWICE DAILY START THE DAY AFTER CHEMOTHERAPY FOR 2 DAYS THEN TAKE AS NEEDED FOR NAUSEA OR VOMITING (Patient not taking: Reported on 10/19/2016) 30 tablet 0    Results for orders placed or performed during the hospital encounter of  10/29/16 (from the past 48 hour(s))  CBC with Differential     Status: Abnormal   Collection Time: 10/29/16  1:40 AM  Result Value Ref Range   WBC 3.0 (L) 3.6 - 11.0 K/uL   RBC 3.32 (L) 3.80 - 5.20 MIL/uL   Hemoglobin 8.8 (L) 12.0 - 16.0 g/dL   HCT 28.3 (L) 35.0 - 47.0 %   MCV 85.3 80.0 - 100.0 fL   MCH 26.5 26.0 - 34.0 pg   MCHC 31.1 (L) 32.0 - 36.0 g/dL   RDW 21.5 (H) 11.5 - 14.5 %   Platelets 110 (L) 150 - 440 K/uL   Neutrophils Relative % 76 %   Neutro Abs 2.3 1.4 - 6.5 K/uL   Lymphocytes Relative 17 %   Lymphs Abs 0.5 (L) 1.0 - 3.6 K/uL   Monocytes Relative 4 %   Monocytes Absolute 0.1 (L) 0.2 - 0.9 K/uL   Eosinophils Relative 2 %   Eosinophils Absolute 0.1 0 - 0.7 K/uL   Basophils Relative 1 %   Basophils Absolute 0.0 0 - 0.1 K/uL  Comprehensive metabolic panel     Status: Abnormal   Collection Time: 10/29/16  1:40 AM  Result Value Ref Range   Sodium 137 135 - 145 mmol/L   Potassium 2.7 (LL) 3.5 - 5.1 mmol/L    Comment: CRITICAL RESULT CALLED TO, READ BACK BY AND VERIFIED WITH MICHELE MORTON AT 0214 10/29/16.RWW/PMH   Chloride 99 (L) 101 - 111 mmol/L   CO2 27 22 - 32 mmol/L   Glucose, Bld 144 (H) 65 - 99 mg/dL   BUN 15 6 - 20 mg/dL   Creatinine, Ser 1.23 (H) 0.44 - 1.00 mg/dL   Calcium 8.6 (L) 8.9 - 10.3 mg/dL   Total Protein 6.9 6.5 - 8.1 g/dL   Albumin 2.5 (L) 3.5 - 5.0 g/dL   AST 15 15 - 41 U/L   ALT 7 (L) 14 - 54 U/L   Alkaline Phosphatase 42 38 - 126 U/L   Total Bilirubin 0.7 0.3 - 1.2 mg/dL   GFR calc non Af Amer 47 (L) >60 mL/min   GFR calc Af Amer 55 (L) >60 mL/min    Comment: (NOTE) The eGFR has been calculated using the CKD EPI equation. This calculation has not been validated in all clinical situations. eGFR's persistently <60 mL/min signify possible Chronic Kidney Disease.    Anion gap 11 5 - 15  Lactic acid, plasma     Status: None   Collection Time: 10/29/16  1:40 AM  Result Value Ref Range   Lactic Acid, Venous 1.9 0.5 - 1.9 mmol/L  Type and  screen [place]     Status: None   Collection Time: 10/29/16  3:13 AM  Result Value Ref Range   ABO/RH(D)  A POS    Antibody Screen NEG    Sample Expiration 11/01/2016   Lactic acid, plasma     Status: None   Collection Time: 10/29/16  4:23 AM  Result Value Ref Range   Lactic Acid, Venous 0.8 0.5 - 1.9 mmol/L   No results found.  Review of Systems  Constitutional: Negative for chills and fever.  HENT: Negative for sore throat and tinnitus.   Eyes: Negative for blurred vision and redness.  Respiratory: Negative for cough and shortness of breath.   Cardiovascular: Negative for chest pain, palpitations, orthopnea and PND.  Gastrointestinal: Negative for abdominal pain, diarrhea, nausea and vomiting.  Genitourinary: Negative for dysuria, frequency and urgency.  Musculoskeletal: Negative for joint pain and myalgias.  Skin: Negative for rash.       No lesions  Neurological: Negative for speech change, focal weakness and weakness.  Endo/Heme/Allergies: Does not bruise/bleed easily.       No temperature intolerance  Psychiatric/Behavioral: Negative for depression and suicidal ideas.    Blood pressure (!) 113/58, pulse 99, temperature 99.4 F (37.4 C), temperature source Oral, resp. rate 20, height 5' 5"  (1.651 m), weight 134.5 kg (296 lb 8 oz), SpO2 100 %. Physical Exam  Vitals reviewed. Constitutional: She is oriented to person, place, and time. She appears well-developed and well-nourished. No distress.  HENT:  Head: Normocephalic and atraumatic.  Mouth/Throat: Oropharynx is clear and moist.  Eyes: Conjunctivae and EOM are normal. Pupils are equal, round, and reactive to light. No scleral icterus.  Neck: Normal range of motion. Neck supple. No JVD present. No tracheal deviation present. No thyromegaly present.  Cardiovascular: Normal rate, regular rhythm and normal heart sounds.  Exam reveals no gallop and no friction rub.   No murmur heard. Respiratory:  Effort normal and breath sounds normal.  GI: Soft. Bowel sounds are normal. She exhibits no distension. There is no tenderness.  Genitourinary:  Genitourinary Comments: Deferred  Musculoskeletal: Normal range of motion. She exhibits edema.  Lymphadenopathy:    She has no cervical adenopathy.  Neurological: She is alert and oriented to person, place, and time. No cranial nerve deficit. She exhibits normal muscle tone.  Skin: Skin is warm and dry. Rash (radiation burn to right neck and face with a reactive papular rash ) noted. No erythema.  Gaping wound with purulent drainage under right scapula; multiple deep ulcerations posterior upper right extremity; deep chest wall wound with purulent drainage.  No right radial pulse. Cannot move RUE  Psychiatric: She has a normal mood and affect. Her behavior is normal. Judgment and thought content normal.     Assessment/Plan This is a 61 year old female admitted for sepsis. 1. Sepsis: The patient meets criteria via tachycardia, tachypnea and leukopenia. Source is certainly her infected wounds which are deep and draining purulent material. She states that the drainage is worse and distinctly different than sloughing of skin secondary to radiation. Vancomycin and Zosyn were started in the emergency department. We will follow blood cultures for growth and sensitivities. Her right upper extremity does not have pulses nor can the patient moves the arm which is concerning for gangrene developing in the avascular extremity. The wounds extend into her axilla and chest wall which makes the possibility of amputation of the limb unlikely. Furthermore, she is not a surgical candidate. I have discussed palliative care with the patient. She understands that the arm will continue to be a nidus of infection if antibiotics cannot reach the wounds due to poor blood flow.  She is considering home nursing care as she has a port if she wanted to receive continued IV antibiotic  treatment, or alternatively simply pursue comfort care measures. 2. Acute kidney injury: Multifactorial; hydrate with intravenous fluid. Avoid nephrotoxic agents. 3. Hypokalemia: Replete potassium. 4. 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 The former lesions are stable in the latter is mildly reduced in size. Consult oncology for further guidance. Continue Abemacicalib 5. Hypertension: Continue metoprolol and lisinopril 6. CHF: Continue Lasix per home regimen 7. DVT prophylaxis: Eliquis for pulmonary embolism found incidentally on staging CT scan 8. GI prophylaxis: None The patient is a full code. (She has switched from DO NOT RESUSCITATE as she wants to discuss the issue further with her family). Time spent on admission orders and patient care approximately 45 minutes  Harrie Foreman, MD 10/29/2016, 6:55 AM

## 2016-10-29 NOTE — ED Notes (Signed)
Coral Ceo, rn in to access port.

## 2016-10-29 NOTE — Progress Notes (Signed)
[facility] [place] NAME: Braelee Herrle    MR#:  [id number]  DATE OF BIRTH:  1956/01/23  SUBJECTIVE:  Patient presented earlier was in the morning with bleeding from one of her wart lesions over the right arm. She has significant lymphedema from her history of breast cancer with multiple foul-smelling wounds. -Currently not bleeding. -Denies any fever  REVIEW OF SYSTEMS:   Review of Systems  Constitutional: Negative for chills, fever and weight loss.  HENT: Negative for ear discharge, ear pain and nosebleeds.   Eyes: Negative for blurred vision, pain and discharge.  Respiratory: Negative for sputum production, shortness of breath, wheezing and stridor.   Cardiovascular: Negative for chest pain, palpitations, orthopnea and PND.  Gastrointestinal: Negative for abdominal pain, diarrhea, nausea and vomiting.  Genitourinary: Negative for frequency and urgency.  Musculoskeletal: Negative for back pain and joint pain.  Neurological: Negative for sensory change, speech change, focal weakness and weakness.  Psychiatric/Behavioral: Negative for depression and hallucinations. The patient is not nervous/anxious.    Tolerating Diet: Tolerating PT:   DRUG ALLERGIES:   Allergies  Allergen Reactions  . No Known Allergies     VITALS:  Blood pressure (!) 113/58, pulse 99, temperature 99.4 F (37.4 C), temperature source Oral, resp. rate 20, height 5\' 5"  (1.651 m), weight 134.5 kg (296 lb 8 oz), SpO2 100 %.  PHYSICAL EXAMINATION:   Physical Exam  GENERAL:  61 y.o.-year-old patient lying in the bed with no acute distress. obese EYES: Pupils equal, round, reactive to light and accommodation. No scleral icterus. Extraocular muscles intact.  HEENT: Head atraumatic, normocephalic. Oropharynx and nasopharynx clear.  NECK:  Supple, no jugular venous distention. No thyroid enlargement, no tenderness.  LUNGS: Normal breath sounds bilaterally, no  wheezing, rales, rhonchi. No use of accessory muscles of respiration.  CARDIOVASCULAR: S1, S2 normal. No murmurs, rubs, or gallops.  ABDOMEN: Soft, nontender, nondistended. Bowel sounds present. No organomegaly or mass.  EXTREMITIES: No cyanosis, clubbing or edema b/l.   Right UE lymphedema with multiple open wounds and foul smell NEUROLOGIC: Cranial nerves II through XII are intact. No focal Motor or sensory deficits b/l.   PSYCHIATRIC:  patient is alert and oriented x 3.  SKIN: No obvious rash, lesion As above  LABORATORY PANEL:  CBC  Recent Labs Lab 10/29/16 0140  WBC 3.0*  HGB 8.8*  HCT 28.3*  PLT 110*    Chemistries   Recent Labs Lab 10/29/16 0140  NA 137  K 2.7*  CL 99*  CO2 27  GLUCOSE 144*  BUN 15  CREATININE 1.23*  CALCIUM 8.6*  AST 15  ALT 7*  ALKPHOS 42  BILITOT 0.7   Cardiac Enzymes No results for input(s): TROPONINI in the last 168 hours. RADIOLOGY:  No results found. ASSESSMENT AND PLAN:  61 year old female admitted for sepsis.  1. Sepsis: -  patient meets criteria via tachycardia, tachypnea and leukopenia.  -Source is certainly her infected wounds which are deep and draining purulent material. She states that the drainage is worse and distinctly different than sloughing of skin secondary to radiation.  -ON IV Vancomycin and Zosyn  -The wounds extend into her axilla and chest wall -Wound care consult -? Surgery consult  2. Acute kidney injury: Multifactorial; hydrate with intravenous fluid. Avoid nephrotoxic agents.  3. Hypokalemia: Replete potassium.  4. 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 The former lesions are stable in the latter is mildly reduced in size. Consult  oncology for further guidance. Continue Abemacicalib  5. Hypertension: Continue metoprolol and lisinopril  6. CHF: Continue Lasix per home regimen  7. DVT prophylaxis: Eliquis for pulmonary embolism found incidentally on staging CT scan  8. GI  prophylaxis: None  Case discussed with Care Management/Social Worker. Management plans discussed with the patient, family and they are in agreement.  CODE STATUS: FULL  DVT Prophylaxis: eliquis  TOTAL TIME TAKING CARE OF THIS PATIENT: *30* minutes.  >50% time spent on counselling and coordination of care  POSSIBLE D/C IN *1-2* DAYS, DEPENDING ON CLINICAL CONDITION.  Note: This dictation was prepared with Dragon dictation along with smaller phrase technology. Any transcriptional errors that result from this process are unintentional.  Chenoa Luddy M.D on 10/29/2016 at 12:14 PM  Between 7am to 6pm - Pager - [phone]  After 6pm go to [email or web address] - password [email or web address] [facility]  Office  [phone]  CC: Primary care physician; Kirk Ruths, MD

## 2016-10-30 LAB — BASIC METABOLIC PANEL
Anion gap: 6 (ref 5–15)
BUN: 14 mg/dL (ref 6–20)
CHLORIDE: 105 mmol/L (ref 101–111)
CO2: 27 mmol/L (ref 22–32)
CREATININE: 1.51 mg/dL — AB (ref 0.44–1.00)
Calcium: 8 mg/dL — ABNORMAL LOW (ref 8.9–10.3)
GFR, EST AFRICAN AMERICAN: 43 mL/min — AB (ref >60)
GFR, EST NON AFRICAN AMERICAN: 37 mL/min — AB (ref >60)
Glucose, Bld: 158 mg/dL — ABNORMAL HIGH (ref 65–99)
POTASSIUM: 4 mmol/L (ref 3.5–5.1)
SODIUM: 138 mmol/L (ref 135–145)

## 2016-10-30 LAB — HIV ANTIBODY (ROUTINE TESTING W REFLEX): HIV Screen 4th Generation wRfx: NONREACTIVE

## 2016-10-30 MED ORDER — AMOXICILLIN-POT CLAVULANATE 875-125 MG PO TABS
1.0000 | ORAL_TABLET | Freq: Two times a day (BID) | ORAL | Status: DC
  Administered 2016-10-30 – 2016-11-03 (×9): 1 via ORAL
  Filled 2016-10-30 (×9): qty 1

## 2016-10-30 MED ORDER — ENSURE ENLIVE PO LIQD
237.0000 mL | Freq: Three times a day (TID) | ORAL | Status: DC
  Administered 2016-10-31 – 2016-11-03 (×7): 237 mL via ORAL

## 2016-10-30 NOTE — Progress Notes (Signed)
Initial Nutrition Assessment  DOCUMENTATION CODES:   Obesity unspecified  INTERVENTION:  1. Ensure Enlive po BID, each supplement provides 350 kcal and 20 grams of protein 2. Provided High Protein, High Calorie nutrition therapy handout, and Taste and Smell Changes handout. Discussed importance of calorie intake, maintaining weight, and nutrition status on outcome of treatment. Patient was appreciative and receptive.  NUTRITION DIAGNOSIS:   Increased nutrient needs related to chronic illness, cancer and cancer related treatments as evidenced by energy intake < 75% for > or equal to 1 month, per patient/family report.  GOAL:   Patient will meet greater than or equal to 90% of their needs  MONITOR:   PO intake, I & O's, Labs, Weight trends, Supplement acceptance  REASON FOR ASSESSMENT:   Malnutrition Screening Tool    ASSESSMENT:   The patient with past medical history of stage IV breast cancer currently undergoing palliative radiation therapy and receiving oral chemotherapy presents to the emergency department complaining of draining wounds  [name] states she has eaten virtually nothing but 2-3 ensures per day for the past month since starting her most recent round of chemo. Currently on Faslodex and Verzenio (abemaciclib). Weight has been maintained it appears but weight loss is likely masked by edema of R Arm. Per chart she currently exhibits a 14#/4.5% insignificant wt loss over 2 months.  Denies any nausea/vomiting or issues chewing/swallowing/choking Admits to taste changes - addressed above. Drinking is easier for patient that eating at this point given poor appetite.  Nutrition-Focused physical exam completed. Findings are no fat depletion, no muscle depletion, and moderate-severe edema.   Unable to diagnose malnutrition at this time.  Labs and medications reviewed: Phosphorus 2.3 Ca-Vit D, Omeega 3's, KCL, Phos, Colace  Diet Order:  Diet regular Room service  appropriate? Yes; Fluid consistency: Thin  Skin:  Wound (see comment) (Multiple non-pressure wounds to R Arm, R Breast)  Last BM:  10/29/2016  Height:   Ht Readings from Last 1 Encounters:  10/29/16 5\' 5"  (1.651 m)    Weight:   Wt Readings from Last 1 Encounters:  10/30/16 (!) 300 lb 12.8 oz (136.4 kg)    Ideal Body Weight:  56.81 kg  BMI:  Body mass index is 50.06 kg/m.  Estimated Nutritional Needs:   Kcal:  2300-2700 calories  Protein:  160-200 gm  Fluid:  >/= 2.3L  EDUCATION NEEDS:   Education needs addressed  [name]. Amy Homewood, MS, RD LDN Inpatient Clinical Dietitian Pager [phone]

## 2016-10-30 NOTE — Consult Note (Signed)
[facility] Nurse wound consult note Reason for Consult:Chronic, non-healing full thickness lesions related to her Stage 4 breast cancer.  Wounds present in the right axilla, right arm and right upper back. Scabbed lesion (or dired neoplastic lesion) with elevation on right upper arm. Wound type: neoplastic Pressure Injury POA: No Measurement: Right arm:  2.5cm round with yellow slough obscuring wound bed. Light yellow exudate on old dressing in a small amount. Right axilla two lesions:  Most posterior measures 2cm x 6cm x 0.1cm with red, moist tissue; small amount of serosanguinous exudate on old dressing. Most anterior measures 3cm x 2.5cm x 0.2cm with moist red wound bed and scant serosanguinous exudate on old dressing. Right upper back lesions: several lesions, the largest of which measures 2cm x 3cm x 0.1cm. Scant bleeding from moist red incision. Right upper arm:  Scabbed lesion (or dried neoplasm) measuring 1.6cm round with elevation to 0.5cm.  No surrounding erythema, induration or warmth. Wound bed: As described above. Drainage (amount, consistency, odor) As described above.  Wounds with musty odor. Periwound: Intact, dry. Dressing procedure/placement/frequency:  Orders are provided for daily x4 wound care using a hemostatic and absorbent dressing, calcium alginate (Algisite, in house but other brands may be used in home care) and then decreasing the frequency of these dressings to three times weekly.  Patient is amenable to [facility] services for assistance with wound care, instructions for obtaining of medical supplies via a surgical dressing supply house and for assessments until wounds have stabilized and patient and husband can again manage wounds. Ideally, showering prior to wound care is advised, but I will leave that recommendation to the patient's medical team.  Follow up will be by oncology team or PCP. Thank you for this consultation request on this nice patient. [facility] nursing team will not follow,  but will remain available to this patient, the nursing and medical teams.  Please re-consult if needed. Thanks, Maudie Flakes, MSN, RN, [place], Arther Abbott  Pager# [phone]

## 2016-10-30 NOTE — Progress Notes (Addendum)
[facility] at [place] NAME: [name]    MR#:  [id number]  DATE OF BIRTH:  11/20/1955  SUBJECTIVE:  Patient presented  with bleeding from one of her lesions over the right arm. She has significant lymphedema from her history of breast cancer with multiple foul-smelling wounds. -Currently not bleeding. -Denies any fever  REVIEW OF SYSTEMS:   Review of Systems  Constitutional: Negative for chills, fever and weight loss.  HENT: Negative for ear discharge, ear pain and nosebleeds.   Eyes: Negative for blurred vision, pain and discharge.  Respiratory: Negative for sputum production, shortness of breath, wheezing and stridor.   Cardiovascular: Negative for chest pain, palpitations, orthopnea and PND.  Gastrointestinal: Negative for abdominal pain, diarrhea, nausea and vomiting.  Genitourinary: Negative for frequency and urgency.  Musculoskeletal: Negative for back pain and joint pain.  Neurological: Negative for sensory change, speech change, focal weakness and weakness.  Psychiatric/Behavioral: Negative for depression and hallucinations. The patient is not nervous/anxious.    Tolerating Diet:yes Tolerating PT: ambulatory  DRUG ALLERGIES:   Allergies  Allergen Reactions  . No Known Allergies     VITALS:  Blood pressure 122/61, pulse (!) 106, temperature 98.2 F (36.8 C), temperature source Oral, resp. rate 20, height 5\' 5"  (1.651 m), weight (!) 136.4 kg (300 lb 12.8 oz), SpO2 99 %.  PHYSICAL EXAMINATION:   Physical Exam  GENERAL:  61 y.o.-year-old patient lying in the bed with no acute distress. obese EYES: Pupils equal, round, reactive to light and accommodation. No scleral icterus. Extraocular muscles intact.  HEENT: Head atraumatic, normocephalic. Oropharynx and nasopharynx clear.  NECK:  Supple, no jugular venous distention. No thyroid enlargement, no tenderness.  LUNGS: Normal breath sounds bilaterally, no wheezing,  rales, rhonchi. No use of accessory muscles of respiration.  CARDIOVASCULAR: S1, S2 normal. No murmurs, rubs, or gallops.  ABDOMEN: Soft, nontender, nondistended. Bowel sounds present. No organomegaly or mass.  EXTREMITIES: No cyanosis, clubbing or edema b/l.   Right UE lymphedema with multiple open wounds and foul smell NEUROLOGIC: Cranial nerves II through XII are intact. No focal Motor or sensory deficits b/l.   PSYCHIATRIC:  patient is alert and oriented x 3.  SKIN: No obvious rash, lesion As above  LABORATORY PANEL:  CBC  Recent Labs Lab 10/29/16 0140  WBC 3.0*  HGB 8.8*  HCT 28.3*  PLT 110*    Chemistries   Recent Labs Lab 10/29/16 0140  NA 137  K 2.7*  CL 99*  CO2 27  GLUCOSE 144*  BUN 15  CREATININE 1.23*  CALCIUM 8.6*  AST 15  ALT 7*  ALKPHOS 42  BILITOT 0.7   Cardiac Enzymes No results for input(s): TROPONINI in the last 168 hours. RADIOLOGY:  No results found. ASSESSMENT AND PLAN:  61 year old female admitted for sepsis.  1. Sepsis: -  patient meets criteria via tachycardia, tachypnea and leukopenia.  -Source is certainly her infected wounds which are deep and draining purulent material. She states that the drainage is worse and distinctly different than sloughing of skin secondary to radiation.  -ON IV Vancomycin and Zosyn ---change to oral augmentin -The wounds extend into her axilla and chest wall -Wound care consult appreciated. Follow wound dressings and HHRN to help with it after d/c  2. Acute kidney injury: Multifactorial; hydrate with intravenous fluid. Avoid nephrotoxic agents. -received IVF  3. Hypokalemia: Replete potassium. BMP today  4. 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 The former  lesions are stable in the latter is mildly reduced in size. -Continue Abemacicalib -appreciate oncology input  5. Hypertension:  -Continue metoprolol and lisinopril  6. CHF: Continue Lasix per home regimen  7. DVT  prophylaxis: Eliquis for pulmonary embolism found incidentally on staging CT scan  D/w pt and husband If remains stable d/c home in am Care mnx for d/c planning  Case discussed with Care Management/Social Worker. Management plans discussed with the patient, family and they are in agreement.  CODE STATUS: FULL  DVT Prophylaxis: eliquis  TOTAL TIME TAKING CARE OF THIS PATIENT: *20* minutes.  >50% time spent on counselling and coordination of care  POSSIBLE D/C IN *1-2* DAYS, DEPENDING ON CLINICAL CONDITION.  Note: This dictation was prepared with Dragon dictation along with smaller phrase technology. Any transcriptional errors that result from this process are unintentional.  Aileana Hodder M.D on 10/30/2016 at 8:03 AM  Between 7am to 6pm - Pager - [phone]  After 6pm go to [email or web address] - password [email or web address] [facility]  Office  [phone]  CC: Primary care physician; Kirk Ruths, MD

## 2016-10-31 ENCOUNTER — Inpatient Hospital Stay: Payer: BC Managed Care – PPO

## 2016-10-31 ENCOUNTER — Inpatient Hospital Stay: Payer: BC Managed Care – PPO | Admitting: Internal Medicine

## 2016-10-31 DIAGNOSIS — R630 Anorexia: Secondary | ICD-10-CM

## 2016-10-31 DIAGNOSIS — E860 Dehydration: Secondary | ICD-10-CM

## 2016-10-31 DIAGNOSIS — Z7901 Long term (current) use of anticoagulants: Secondary | ICD-10-CM

## 2016-10-31 DIAGNOSIS — E669 Obesity, unspecified: Secondary | ICD-10-CM

## 2016-10-31 DIAGNOSIS — D649 Anemia, unspecified: Secondary | ICD-10-CM

## 2016-10-31 DIAGNOSIS — A419 Sepsis, unspecified organism: Principal | ICD-10-CM

## 2016-10-31 DIAGNOSIS — N179 Acute kidney failure, unspecified: Secondary | ICD-10-CM

## 2016-10-31 LAB — CBC WITH DIFFERENTIAL/PLATELET
BASOS PCT: 1 %
Basophils Absolute: 0 10*3/uL (ref 0–0.1)
EOS ABS: 0.1 10*3/uL (ref 0–0.7)
Eosinophils Relative: 4 %
HCT: 22.8 % — ABNORMAL LOW (ref 35.0–47.0)
Hemoglobin: 7.1 g/dL — ABNORMAL LOW (ref 12.0–16.0)
Lymphocytes Relative: 24 %
Lymphs Abs: 0.7 10*3/uL — ABNORMAL LOW (ref 1.0–3.6)
MCH: 27.1 pg (ref 26.0–34.0)
MCHC: 31.4 g/dL — AB (ref 32.0–36.0)
MCV: 86.5 fL (ref 80.0–100.0)
MONO ABS: 0.1 10*3/uL — AB (ref 0.2–0.9)
MONOS PCT: 5 %
Neutro Abs: 1.9 10*3/uL (ref 1.4–6.5)
Neutrophils Relative %: 66 %
Platelets: 110 10*3/uL — ABNORMAL LOW (ref 150–440)
RBC: 2.63 MIL/uL — ABNORMAL LOW (ref 3.80–5.20)
RDW: 21.9 % — AB (ref 11.5–14.5)
WBC: 2.8 10*3/uL — ABNORMAL LOW (ref 3.6–11.0)

## 2016-10-31 LAB — BASIC METABOLIC PANEL
Anion gap: 7 (ref 5–15)
BUN: 21 mg/dL — AB (ref 6–20)
CALCIUM: 8.7 mg/dL — AB (ref 8.9–10.3)
CO2: 29 mmol/L (ref 22–32)
CREATININE: 2.42 mg/dL — AB (ref 0.44–1.00)
Chloride: 104 mmol/L (ref 101–111)
GFR calc non Af Amer: 21 mL/min — ABNORMAL LOW (ref >60)
GFR, EST AFRICAN AMERICAN: 24 mL/min — AB (ref >60)
Glucose, Bld: 123 mg/dL — ABNORMAL HIGH (ref 65–99)
Potassium: 4 mmol/L (ref 3.5–5.1)
SODIUM: 140 mmol/L (ref 135–145)

## 2016-10-31 LAB — URINALYSIS, ROUTINE W REFLEX MICROSCOPIC
Bacteria, UA: NONE SEEN
Bilirubin Urine: NEGATIVE
Glucose, UA: NEGATIVE mg/dL
Ketones, ur: NEGATIVE mg/dL
Leukocytes, UA: NEGATIVE
Nitrite: NEGATIVE
Protein, ur: NEGATIVE mg/dL
SPECIFIC GRAVITY, URINE: 1.006 (ref 1.005–1.030)
pH: 7 (ref 5.0–8.0)

## 2016-10-31 LAB — PREPARE RBC (CROSSMATCH)

## 2016-10-31 LAB — ABO/RH: ABO/RH(D): A POS

## 2016-10-31 LAB — PHOSPHORUS: Phosphorus: 3.8 mg/dL (ref 2.5–4.6)

## 2016-10-31 MED ORDER — AMOXICILLIN-POT CLAVULANATE 875-125 MG PO TABS: 1.0000 | ORAL_TABLET | Freq: Two times a day (BID) | ORAL | 0 refills | Status: DC

## 2016-10-31 MED ORDER — ENSURE ENLIVE PO LIQD: 237.0000 mL | Freq: Three times a day (TID) | ORAL | 12 refills | Status: AC

## 2016-10-31 MED ORDER — HEPARIN SOD (PORK) LOCK FLUSH 100 UNIT/ML IV SOLN
500.0000 [IU] | Freq: Once | INTRAVENOUS | Status: DC
Start: 2016-10-31 — End: 2016-11-03
  Filled 2016-10-31: qty 5

## 2016-10-31 MED ORDER — SODIUM CHLORIDE 0.9 % IV SOLN
Freq: Once | INTRAVENOUS | Status: AC
  Administered 2016-10-31: 18:00:00 via INTRAVENOUS

## 2016-10-31 MED ORDER — SODIUM CHLORIDE 0.9 % IV SOLN
Freq: Once | INTRAVENOUS | Status: AC
  Administered 2016-10-31: 11:00:00 via INTRAVENOUS

## 2016-10-31 NOTE — Progress Notes (Addendum)
[facility] at [place] NAME: [name]    MR#:  [id number]  DATE OF BIRTH:  1956/02/17  SUBJECTIVE:  Weakness. Out of the chair eating BF  REVIEW OF SYSTEMS:   Review of Systems  Constitutional: Negative for chills, fever and weight loss.  HENT: Negative for ear discharge, ear pain and nosebleeds.   Eyes: Negative for blurred vision, pain and discharge.  Respiratory: Negative for sputum production, shortness of breath, wheezing and stridor.   Cardiovascular: Negative for chest pain, palpitations, orthopnea and PND.  Gastrointestinal: Negative for abdominal pain, diarrhea, nausea and vomiting.  Genitourinary: Negative for frequency and urgency.  Musculoskeletal: Negative for back pain and joint pain.  Neurological: Positive for weakness. Negative for sensory change, speech change and focal weakness.  Psychiatric/Behavioral: Negative for depression and hallucinations. The patient is not nervous/anxious.    Tolerating Diet:yes Tolerating PT: HHPT  DRUG ALLERGIES:   Allergies  Allergen Reactions  . No Known Allergies     VITALS:  Blood pressure (!) 99/56, pulse 78, temperature 97.6 F (36.4 C), temperature source Oral, resp. rate 20, height 5\' 5"  (1.651 m), weight (!) 136.4 kg (300 lb 12.8 oz), SpO2 99 %.  PHYSICAL EXAMINATION:   Physical Exam  GENERAL:  61 y.o.-year-old patient lying in the bed with no acute distress.  EYES: Pupils equal, round, reactive to light and accommodation. No scleral icterus. Extraocular muscles intact.  HEENT: Head atraumatic, normocephalic. Oropharynx and nasopharynx clear.  NECK:  Supple, no jugular venous distention. No thyroid enlargement, no tenderness.  LUNGS: Normal breath sounds bilaterally, no wheezing, rales, rhonchi. No use of accessory muscles of respiration.  CARDIOVASCULAR: S1, S2 normal. No murmurs, rubs, or gallops.  ABDOMEN: Soft, nontender, nondistended. Bowel sounds present.  No organomegaly or mass.  EXTREMITIES: No cyanosis, clubbing or edema b/l.    NEUROLOGIC: Cranial nerves II through XII are intact. No focal Motor or sensory deficits b/l.   PSYCHIATRIC:  patient is alert and oriented x 3.  SKIN: No obvious rash, lesion, or ulcer.   LABORATORY PANEL:  CBC  Recent Labs Lab 10/31/16 0832  WBC 2.8*  HGB 7.1*  HCT 22.8*  PLT 110*    Chemistries   Recent Labs Lab 10/29/16 0140  10/31/16 0832  NA 137  < > 140  K 2.7*  < > 4.0  CL 99*  < > 104  CO2 27  < > 29  GLUCOSE 144*  < > 123*  BUN 15  < > 21*  CREATININE 1.23*  < > 2.42*  CALCIUM 8.6*  < > 8.7*  AST 15  --   --   ALT 7*  --   --   ALKPHOS 42  --   --   BILITOT 0.7  --   --   < > = values in this interval not displayed. Cardiac Enzymes No results for input(s): TROPONINI in the last 168 hours. RADIOLOGY:  No results found. ASSESSMENT AND PLAN:   61 year old female admitted for sepsis.  1. Sepsis:present  On admisison - patient meets criteria via tachycardia, tachypnea and leukopenia.  -Source is certainly her infected wounds which are deep and draining purulent material. She states that the drainage is worse and distinctly different than sloughing of skin secondary to radiation.  -ON IV Vancomycin and Zosyn ---changed to oral augmentin -The wounds extend into her axilla and chest wall -Wound care consult appreciated. Follow wound dressings and HHRN to help with it  after d/c  2. Acute kidney injury: Multifactorial; hydrate with intravenous fluid. Avoid nephrotoxic agents. -received IVF appears euvolemic -baseline creat 0.9 -came in with 1.23---1.5--2.42 -Dr Candiss Norse consulted -hold lasix and lisinopril  3. Hypokalemia: Replete potassium. K 4.0  4. 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 The former lesions are stable in the latter is mildly reduced in size. -Continue Abemacicalib -appreciate oncology input  5. Hypertension: with relative  hypotension -hold BP meds  6. CHF: Continue Lasix per home regimen Pt is not in HF  7. DVT prophylaxis: Eliquis for pulmonary embolism found incidentally on staging CT scan  8.Pancytopenia with h/o anemia  -came in with Hgb 8.8---7.1---1 unit PRBC today ( spoke with pt and agreeable) -transfuse 1 unit pack cell -no active bleeding from ulcer site   Case discussed with Care Management/Social Worker. Management plans discussed with the patient, family and they are in agreement.  CODE STATUS: full  DVT Prophylaxis: eliquis  TOTAL TIME TAKING CARE OF THIS PATIENT: *30* minutes.  >50% time spent on counselling and coordination of care  POSSIBLE D/C IN 1-2 DAYS, DEPENDING ON CLINICAL CONDITION.  Note: This dictation was prepared with Dragon dictation along with smaller phrase technology. Any transcriptional errors that result from this process are unintentional.  Marinus Eicher M.D on 10/31/2016 at 10:15 AM  Between 7am to 6pm - Pager - [phone]  After 6pm go to [email or web address] - password [email or web address] [facility]  Office  [phone]  CC: Primary care physician; Kirk Ruths, MD

## 2016-10-31 NOTE — Consult Note (Signed)
Date: 10/31/2016                  Patient Name:  CAMMI CONSALVO  MRN: [id number]  DOB: 03/14/1956  Age / Sex: 61 y.o., female         PCP: Kirk Ruths, MD                 Service Requesting Consult: Internal Medicine/ Dr Fritzi Mandes                 Reason for Consult: ARF            History of Present Illness: Patient is a 61 y.o. female with medical problems of stage IV breast cancer treated with radiation therapy and oral chemotherapy, morbid obesity, anticoagulation for pulmonary embolism,who was admitted to [facility] on 10/29/2016 for evaluation of mass and wounds on the right arm.  Patient is currently getting broad-spectrum antibiotics and wound care Nephrology team has been asked to evaluate for acute renal failure Patient's baseline creatinine is 0.68 from October 03, 2016 Admission creatinine was 1.23 It has progressively worsened and today's creatinine is 2.42 Patient is being treated with vancomycin She has not received any IV contrast   Medications: Outpatient medications: Prescriptions Prior to Admission  Medication Sig Dispense Refill Last Dose  . Abemaciclib 150 MG TABS Take 150 mg by mouth every 12 (twelve) hours. 60 tablet 6 10/28/2016 at Unknown time  . acetaminophen (TYLENOL) 500 MG tablet Take 1,000 mg by mouth every 6 (six) hours as needed.   10/28/2016 at Unknown time  . [facility] CALCIUM 600+D 600-800 MG-UNIT TABS Take 1 tablet by mouth daily. At noon  3 10/28/2016 at Unknown time  . ELIQUIS 5 MG TABS tablet TAKE 1 TABLET BY MOUTH TWICE DAILY 180 tablet PRN 10/28/2016 at Unknown time  . fexofenadine (ALLEGRA) 180 MG tablet Take 180 mg by mouth daily.    Taking  . furosemide (LASIX) 20 MG tablet Take 20 mg by mouth daily.  1 10/28/2016 at Unknown time  . gabapentin (NEURONTIN) 300 MG capsule TAKE 1 CAPSULE BY MOUTH ONCE DAILY IN THE MORNING AND TAKE 2 CAPSULES BY MOUTH IN THE EVENING 90 capsule PRN 10/28/2016 at Unknown time  . HYDROcodone-acetaminophen (NORCO) 5-325 MG  tablet Take 1 tablet by mouth every 6 (six) hours as needed for moderate pain (every 6 to 8 hrs prn pain). 60 tablet 0   . lisinopril (PRINIVIL,ZESTRIL) 20 MG tablet Take 20 mg by mouth daily.   10/28/2016 at Unknown time  . metoprolol succinate (TOPROL-XL) 50 MG 24 hr tablet Take 50 mg by mouth 2 (two) times daily. Take with or immediately following a meal.   10/28/2016 at Unknown time  . omega-3 acid ethyl esters (LOVAZA) 1 G capsule Take 1 g by mouth at bedtime.   10/28/2016 at Unknown time  . phosphorus (PHOSPHA 250 NEUTRAL) 155-852-130 MG tablet Take 1 tablet (250 mg total) by mouth 2 (two) times daily. 180 tablet 1 10/28/2016 at Unknown time  . potassium chloride (K-DUR) 10 MEQ tablet Take 10 mEq by mouth daily.   1 10/28/2016 at Unknown time  . simvastatin (ZOCOR) 80 MG tablet Take 80 mg by mouth at bedtime.   10/28/2016 at Unknown time  . ondansetron (ZOFRAN) 8 MG tablet TAKE 1 TABLET (8MG ) BY MOUTH TWICE DAILY START THE DAY AFTER CHEMOTHERAPY FOR 2 DAYS THEN TAKE AS NEEDED FOR NAUSEA OR VOMITING (Patient not taking: Reported on 10/19/2016) 30 tablet 0  Not Taking    Current medications: Current Facility-Administered Medications  Medication Dose Route Frequency Provider Last Rate Last Dose  . 0.9 %  sodium chloride infusion   Intravenous Once Fritzi Mandes, MD      . Abemaciclib TABS 150 mg  150 mg Oral Q12H Fritzi Mandes, MD   150 mg at 10/31/16 0546  . acetaminophen (TYLENOL) tablet 1,000 mg  1,000 mg Oral Q6H PRN Harrie Foreman, MD      . amoxicillin-clavulanate (AUGMENTIN) 875-125 MG per tablet 1 tablet  1 tablet Oral Q12H Fritzi Mandes, MD   1 tablet at 10/31/16 [phone]  . apixaban (ELIQUIS) tablet 5 mg  5 mg Oral BID Harrie Foreman, MD   5 mg at 10/31/16 8588  . atorvastatin (LIPITOR) tablet 40 mg  40 mg Oral q1800 Harrie Foreman, MD   40 mg at 10/30/16 1816  . calcium-vitamin D (OSCAL WITH D) 500-200 MG-UNIT per tablet 1 tablet  1 tablet Oral Q breakfast Harrie Foreman, MD   1 tablet at  10/31/16 [phone]  . docusate sodium (COLACE) capsule 100 mg  100 mg Oral BID Harrie Foreman, MD   100 mg at 10/31/16 7412  . feeding supplement (ENSURE ENLIVE) (ENSURE ENLIVE) liquid 237 mL  237 mL Oral TID BM Fritzi Mandes, MD   237 mL at 10/31/16 1000  . heparin lock flush 100 unit/mL  500 Units Intravenous Once Fritzi Mandes, MD      . HYDROcodone-acetaminophen (NORCO/VICODIN) 5-325 MG per tablet 1 tablet  1 tablet Oral Q6H PRN Harrie Foreman, MD   1 tablet at 10/30/16 2356  . loratadine (CLARITIN) tablet 10 mg  10 mg Oral Daily Harrie Foreman, MD   10 mg at 10/31/16 8786  . morphine 2 MG/ML injection 2 mg  2 mg Intravenous Q4H PRN Harrie Foreman, MD   2 mg at 10/30/16 1108  . omega-3 acid ethyl esters (LOVAZA) capsule 1 g  1 g Oral QHS Harrie Foreman, MD   1 g at 10/30/16 2124  . ondansetron (ZOFRAN) tablet 4 mg  4 mg Oral Q6H PRN Harrie Foreman, MD       Or  . ondansetron [facility]) injection 4 mg  4 mg Intravenous Q6H PRN Harrie Foreman, MD      . phosphorus (K PHOS NEUTRAL) tablet 250 mg  250 mg Oral BID Harrie Foreman, MD   250 mg at 10/31/16 7672   Facility-Administered Medications Ordered in Other Encounters  Medication Dose Route Frequency Provider Last Rate Last Dose  . sodium chloride 0.9 % injection 10 mL  10 mL Intracatheter PRN Forest Gleason, MD   10 mL at 07/07/15 0911  . sodium chloride flush (NS) 0.9 % injection 10 mL  10 mL Intravenous PRN Choksi, Janak, MD      . sodium chloride flush (NS) 0.9 % injection 10 mL  10 mL Intravenous PRN Cammie Sickle, MD   10 mL at 01/20/16 1320      Allergies: Allergies  Allergen Reactions  . No Known Allergies       Past Medical History: Past Medical History:  Diagnosis Date  . Anemia   . Anginal pain ([place])   . Breast cancer ([place])    2014   . Breast cancer metastasized to bone ([place]) 11/21/2014  . Hypertension   . Last menstrual period (LMP) > 10 days ago 2007  . Lymphedema of right upper  extremity   .  Myocardial infarction ([place])   . Post-menopausal 2006  . Sleep apnea   . Wound drainage      Past Surgical History: Past Surgical History:  Procedure Laterality Date  . CORONARY ANGIOPLASTY WITH STENT PLACEMENT N/A   . HERNIA REPAIR     umbilical hernia repair  . PORTACATH PLACEMENT Right 02/03/2015   Procedure: INSERTION PORT-A-CATH;  Surgeon: [name], MD;  Location: [facility] ORS;  Service: General;  Laterality: Right;     Family History: Family History  Problem Relation Age of Onset  . Atrial fibrillation Sister   . Hypertension Sister      Social History: Social History   Social History  . Marital status: Married    Spouse name: N/A  . Number of children: N/A  . Years of education: N/A   Occupational History  . Not on file.   Social History Main Topics  . Smoking status: Former Smoker    Packs/day: 1.00    Types: Cigarettes    Quit date: 10/25/2004  . Smokeless tobacco: Never Used     Comment: stopped smoking greater than 7 years ago  . Alcohol use No  . Drug use: No  . Sexual activity: Not Currently   Other Topics Concern  . Not on file   Social History Narrative  . No narrative on file     Review of Systems: Gen:  Decreased appetite, nausea and vomiting prior to hospitalization HEENT: denies vision or hearing problems CV: no chest pain or shortness of breath Resp: no cough or sputum production OZ:HYQMVHQI is poor but at today.  No nausea or vomiting now GU : denies any problems with voiding  No blood in the urine MS: denies any acute joint pain.  She has lymphedema right arm Derm:  foul-smelling wounds on the right shoulder Psych: no complaints Heme: breast cancer Neuro: no complaints Endocrine.  No complaints  Vital Signs: Blood pressure (!) 99/56, pulse 78, temperature 97.6 F (36.4 C), temperature source Oral, resp. rate 20, height 5\' 5"  (1.651 m), weight (!) 136.4 kg (300 lb 12.8 oz), SpO2 99 %.   Intake/Output  Summary (Last 24 hours) at 10/31/16 1347 Last data filed at 10/31/16 0800  Gross per 24 hour  Intake              477 ml  Output                0 ml  Net              477 ml    Weight trends: Filed Weights   10/29/16 0115 10/29/16 0611 10/30/16 0447  Weight: (!) 138.3 kg (305 lb) 134.5 kg (296 lb 8 oz) (!) 136.4 kg (300 lb 12.8 oz)    Physical Exam: General:  morbidly obese lady, sitting up in the chair  HEENT Moist oral mucous membranes, anicteric  Neck:  supple  Lungs: Normal breathing effort, limited exam, no crackles  Heart::  no rub or gallop  Abdomen: Soft, obese, nontender  Extremities:  + pitting edema bilaterally  Neurologic: Alert, oriented  Skin: Foul-smelling wounds over her right shoulder and arm             Lab results: Basic Metabolic Panel:  Recent Labs Lab 10/29/16 0140 10/30/16 1002 10/31/16 0832  NA 137 138 140  K 2.7* 4.0 4.0  CL 99* 105 104  CO2 27 27 29   GLUCOSE 144* 158* 123*  BUN 15 14 21*  CREATININE 1.23*  1.51* 2.42*  CALCIUM 8.6* 8.0* 8.7*    Liver Function Tests:  Recent Labs Lab 10/29/16 0140  AST 15  ALT 7*  ALKPHOS 42  BILITOT 0.7  PROT 6.9  ALBUMIN 2.5*   No results for input(s): LIPASE, AMYLASE in the last 168 hours. No results for input(s): AMMONIA in the last 168 hours.  CBC:  Recent Labs Lab 10/29/16 0140 10/31/16 0832  WBC 3.0* 2.8*  NEUTROABS 2.3 1.9  HGB 8.8* 7.1*  HCT 28.3* 22.8*  MCV 85.3 86.5  PLT 110* 110*    Cardiac Enzymes: No results for input(s): CKTOTAL, TROPONINI in the last 168 hours.  BNP: Invalid input(s): POCBNP  CBG: No results for input(s): GLUCAP in the last 168 hours.  Microbiology: Recent Results (from the past 720 hour(s))  Blood culture (routine x 2)     Status: None (Preliminary result)   Collection Time: 10/29/16  1:40 AM  Result Value Ref Range Status   Specimen Description BLOOD LEFT ANTECUBITAL  Final   Special Requests   Final    BOTTLES DRAWN AEROBIC AND  ANAEROBIC Blood Culture results may not be optimal due to an excessive volume of blood received in culture bottles   Culture NO GROWTH 2 DAYS  Final   Report Status PENDING  Incomplete  Blood culture (routine x 2)     Status: None (Preliminary result)   Collection Time: 10/29/16  2:40 AM  Result Value Ref Range Status   Specimen Description BLOOD LEFT HAND  Final   Special Requests   Final    BOTTLES DRAWN AEROBIC AND ANAEROBIC Blood Culture adequate volume   Culture NO GROWTH 2 DAYS  Final   Report Status PENDING  Incomplete     Coagulation Studies: No results for input(s): LABPROT, INR in the last 72 hours.  Urinalysis:  Recent Labs  10/29/16 0630  COLORURINE YELLOW*  LABSPEC 1.015  PHURINE 5.0  GLUCOSEU NEGATIVE  HGBUR NEGATIVE  BILIRUBINUR NEGATIVE  KETONESUR NEGATIVE  PROTEINUR NEGATIVE  NITRITE NEGATIVE  LEUKOCYTESUR NEGATIVE        Imaging:  No results found.   Assessment & Plan: Pt is a 61 y.o. Caucasian female with metastatic breast cancer treated with chemotherapy and radiation, lymphedema right arm with wounds,morbid obesity, history of pulmonary embolism requiring chronic anticoagulation , was admitted on 10/29/2016.   1.  Acute renal failure Patient's baseline creatinine is 0.68 from April 2018 It has progressively worsened to 2.4 today Patient is nonoliguric Electrolytes and volume status are acceptable.  No acute indication for dialysis Cause of acute renal failure is likely ATN from multiple factors including taking ACE inhibitors in the setting of volume depletion prior to admission.  It is possible that acute kidney injury was worsened with iv vancomycin - agree with discontinuing ACE inhibitor - Maintain good volume status - Monitor vancomycin levels - Avoid nephrotoxic agents including ACE- inhibitors, IV contrast    Will follow

## 2016-10-31 NOTE — Discharge Summary (Deleted)
[place] [facility] [place] NAME: [name]    MR#:  [id number]  DATE OF BIRTH:  09-Oct-1955  DATE OF ADMISSION:  10/29/2016 ADMITTING PHYSICIAN: Harrie Foreman, MD  DATE OF DISCHARGE: 5/72018  PRIMARY CARE PHYSICIAN: Kirk Ruths, MD    ADMISSION DIAGNOSIS:  Hypokalemia [E87.6] Wound infection [T14.8XXA, L08.9] Bleeding from wound [T14.8XXA] Sepsis, due to unspecified organism [facility]) [A41.9]  DISCHARGE DIAGNOSIS:  Sepsis-resolved Right arm/chest chronic ulcers h/o breast cancer  SECONDARY DIAGNOSIS:   Past Medical History:  Diagnosis Date  . Anemia   . Anginal pain ([place])   . Breast cancer ([place])    2014   . Breast cancer metastasized to bone ([place]) 11/21/2014  . Hypertension   . Last menstrual period (LMP) > 10 days ago 2007  . Lymphedema of right upper extremity   . Myocardial infarction ([place])   . Post-menopausal 2006  . Sleep apnea   . Wound drainage     HOSPITAL COURSE:   61 year old female admitted for sepsis.  1. Sepsis:present  On admisison -  patient meets criteria via tachycardia, tachypnea and leukopenia.  -Source is certainly her infected wounds which are deep and draining purulent material. She states that the drainage is worse and distinctly different than sloughing of skin secondary to radiation.  -ON IV Vancomycin and Zosyn ---changed to oral augmentin -The wounds extend into her axilla and chest wall -Wound care consult appreciated. Follow wound dressings and HHRN to help with it after d/c  2. Acute kidney injury: Multifactorial; hydrate with intravenous fluid. Avoid nephrotoxic agents. -received IVF  3. Hypokalemia: Replete potassium. K 4.0  4. 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 The former lesions are stable in the latter is mildly reduced in size. -Continue Abemacicalib -appreciate oncology input  5. Hypertension:  -Continue metoprolol and  lisinopril  6. CHF: Continue Lasix per home regimen Pt is not in HF  7. DVT prophylaxis: Eliquis for pulmonary embolism found incidentally on staging CT scan  D/c home with [facility] Dressing changes per wound care RN instructions at home by [facility].  CONSULTS OBTAINED:  Treatment Team:  Lequita Asal, MD  DRUG ALLERGIES:   Allergies  Allergen Reactions  . No Known Allergies     DISCHARGE MEDICATIONS:   Current Discharge Medication List    START taking these medications   Details  amoxicillin-clavulanate (AUGMENTIN) 875-125 MG tablet Take 1 tablet by mouth every 12 (twelve) hours. Qty: 10 tablet, Refills: 0    feeding supplement, ENSURE ENLIVE, (ENSURE ENLIVE) LIQD Take 237 mLs by mouth 3 (three) times daily between meals. Qty: 237 mL, Refills: 12      CONTINUE these medications which have NOT CHANGED   Details  Abemaciclib 150 MG TABS Take 150 mg by mouth every 12 (twelve) hours. Qty: 60 tablet, Refills: 6    acetaminophen (TYLENOL) 500 MG tablet Take 1,000 mg by mouth every 6 (six) hours as needed.    [facility]S CALCIUM 600+D 600-800 MG-UNIT TABS Take 1 tablet by mouth daily. At noon Refills: 3    ELIQUIS 5 MG TABS tablet TAKE 1 TABLET BY MOUTH TWICE DAILY Qty: 180 tablet, Refills: PRN   Associated Diagnoses: Carcinoma of overlapping sites of right breast in female, estrogen receptor positive (HCC)    fexofenadine (ALLEGRA) 180 MG tablet Take 180 mg by mouth daily.     furosemide (LASIX) 20 MG tablet Take 20 mg by mouth daily. Refills: 1  gabapentin (NEURONTIN) 300 MG capsule TAKE 1 CAPSULE BY MOUTH ONCE DAILY IN THE MORNING AND TAKE 2 CAPSULES BY MOUTH IN THE EVENING Qty: 90 capsule, Refills: PRN   Associated Diagnoses: Malignant neoplasm of overlapping sites of right breast ([place]); Elevated CEA    HYDROcodone-acetaminophen (NORCO) 5-325 MG tablet Take 1 tablet by mouth every 6 (six) hours as needed for moderate pain (every 6 to 8 hrs prn pain). Qty: 60 tablet,  Refills: 0    lisinopril (PRINIVIL,ZESTRIL) 20 MG tablet Take 20 mg by mouth daily.    metoprolol succinate (TOPROL-XL) 50 MG 24 hr tablet Take 50 mg by mouth 2 (two) times daily. Take with or immediately following a meal.    omega-3 acid ethyl esters (LOVAZA) 1 G capsule Take 1 g by mouth at bedtime.    phosphorus (PHOSPHA 250 NEUTRAL) 155-852-130 MG tablet Take 1 tablet (250 mg total) by mouth 2 (two) times daily. Qty: 180 tablet, Refills: 1   Associated Diagnoses: Carcinoma of overlapping sites of right breast in female, estrogen receptor positive (HCC)    potassium chloride (K-DUR) 10 MEQ tablet Take 10 mEq by mouth daily.  Refills: 1    simvastatin (ZOCOR) 80 MG tablet Take 80 mg by mouth at bedtime.    ondansetron (ZOFRAN) 8 MG tablet TAKE 1 TABLET (8MG ) BY MOUTH TWICE DAILY START THE DAY AFTER CHEMOTHERAPY FOR 2 DAYS THEN TAKE AS NEEDED FOR NAUSEA OR VOMITING Qty: 30 tablet, Refills: 0        If you experience worsening of your admission symptoms, develop shortness of breath, life threatening emergency, suicidal or homicidal thoughts you must seek medical attention immediately by calling 911 or calling your MD immediately  if symptoms less severe.  You Must read complete instructions/literature along with all the possible adverse reactions/side effects for all the Medicines you take and that have been prescribed to you. Take any new Medicines after you have completely understood and accept all the possible adverse reactions/side effects.   Please note  You were cared for by a hospitalist during your hospital stay. If you have any questions about your discharge medications or the care you received while you were in the hospital after you are discharged, you can call the unit and asked to speak with the hospitalist on call if the hospitalist that took care of you is not available. Once you are discharged, your primary care physician will handle any further medical issues. Please  note that NO REFILLS for any discharge medications will be authorized once you are discharged, as it is imperative that you return to your primary care physician (or establish a relationship with a primary care physician if you do not have one) for your aftercare needs so that they can reassess your need for medications and monitor your lab values. Today   SUBJECTIVE   Doing well  VITAL SIGNS:  Blood pressure (!) 115/58, pulse 93, temperature 97.9 F (36.6 C), temperature source Oral, resp. rate 20, height 5\' 5"  (1.651 m), weight (!) 136.4 kg (300 lb 12.8 oz), SpO2 100 %.  I/O:   Intake/Output Summary (Last 24 hours) at 10/31/16 0830 Last data filed at 10/30/16 1830  Gross per 24 hour  Intake          2760.86 ml  Output                0 ml  Net          2760.86 ml    PHYSICAL EXAMINATION:  GENERAL:  61 y.o.-year-old patient lying in the bed with no acute distress.  EYES: Pupils equal, round, reactive to light and accommodation. No scleral icterus. Extraocular muscles intact.  HEENT: Head atraumatic, normocephalic. Oropharynx and nasopharynx clear.  NECK:  Supple, no jugular venous distention. No thyroid enlargement, no tenderness.  LUNGS: Normal breath sounds bilaterally, no wheezing, rales,rhonchi or crepitation. No use of accessory muscles of respiration.  CARDIOVASCULAR: S1, S2 normal. No murmurs, rubs, or gallops.  ABDOMEN: Soft, non-tender, non-distended. Bowel sounds present. No organomegaly or mass.  EXTREMITIES: No pedal edema, cyanosis, or clubbing. Right UE chronic ulcers. Lymphedema-chronic NEUROLOGIC: Cranial nerves II through XII are intact. Muscle strength 5/5 in all extremities. Sensation intact. Gait not checked.  PSYCHIATRIC: The patient is alert and oriented x 3.  SKIN: No obvious rash, lesion, or ulcer.   DATA REVIEW:   CBC   Recent Labs Lab 10/29/16 0140  WBC 3.0*  HGB 8.8*  HCT 28.3*  PLT 110*    Chemistries   Recent Labs Lab 10/29/16 0140  10/30/16 1002  NA 137 138  K 2.7* 4.0  CL 99* 105  CO2 27 27  GLUCOSE 144* 158*  BUN 15 14  CREATININE 1.23* 1.51*  CALCIUM 8.6* 8.0*  AST 15  --   ALT 7*  --   ALKPHOS 42  --   BILITOT 0.7  --     Microbiology Results   Recent Results (from the past 240 hour(s))  Blood culture (routine x 2)     Status: None (Preliminary result)   Collection Time: 10/29/16  1:40 AM  Result Value Ref Range Status   Specimen Description BLOOD LEFT ANTECUBITAL  Final   Special Requests   Final    BOTTLES DRAWN AEROBIC AND ANAEROBIC Blood Culture results may not be optimal due to an excessive volume of blood received in culture bottles   Culture NO GROWTH 1 DAY  Final   Report Status PENDING  Incomplete  Blood culture (routine x 2)     Status: None (Preliminary result)   Collection Time: 10/29/16  2:40 AM  Result Value Ref Range Status   Specimen Description BLOOD LEFT HAND  Final   Special Requests   Final    BOTTLES DRAWN AEROBIC AND ANAEROBIC Blood Culture adequate volume   Culture NO GROWTH 1 DAY  Final   Report Status PENDING  Incomplete    RADIOLOGY:  No results found.   Management plans discussed with the patient, family and they are in agreement.  CODE STATUS:     Code Status Orders        Start     Ordered   10/29/16 0427  Full code  Continuous     10/29/16 0426    Code Status History    Date Active Date Inactive Code Status Order ID Comments User Context   10/29/2016  4:26 AM 10/29/2016  4:26 AM DNR [id number]  Harrie Foreman, MD ED   02/08/2016  5:36 PM 02/09/2016  5:49 PM Full Code [id number]  Hower, Aaron Mose, MD ED      TOTAL TIME TAKING CARE OF THIS PATIENT: 40 minutes.    Suzetta Timko M.D on 10/31/2016 at 8:30 AM  Between 7am to 6pm - Pager - [phone] After 6pm go to [email or web address] - password [email or web address] [facility]  Office  [phone]  CC: Primary care physician; Kirk Ruths, MD

## 2016-10-31 NOTE — Progress Notes (Signed)
[name]   DOB:1956/02/11   YB#:[id number]    Subjective: Metastatic breast cancer.  Patient's foul-smelling discharge from her metastatic malignancy to the shoulder improved. Bleeding resolved. She is currently bandaged. She feels much better since admission. Eating and drinking better. Denies any diarrhea. No nausea no vomiting.   Objective:  Vitals:   10/30/16 2010 10/31/16 0452  BP: (!) 91/47 (!) 115/58  Pulse: 94 93  Resp: 20 20  Temp: 98.4 F (36.9 C) 97.9 F (36.6 C)     Intake/Output Summary (Last 24 hours) at 10/31/16 0826 Last data filed at 10/30/16 1830  Gross per 24 hour  Intake          2760.86 ml  Output                0 ml  Net          2760.86 ml    GENERAL: Well-nourished well-developed; Alert, no distress and comfortable.  She is Accompanied by her husband. She Is sitting in a chair.  Morbidly obese. EYES: no pallor or icterus OROPHARYNX: no thrush or ulceration;  NECK: supple, no masses felt LYMPH:  no palpable lymphadenopathy in the cervical, axillary or inguinal regions LUNGS: clear to auscultation and  No wheeze or crackles HEART/CVS: regular rate & rhythm and no murmurs; chronic bilateral lower extremity edema. ABDOMEN:abdomen soft, non-tender and normal bowel sounds Musculoskeletal:no cyanosis of digits and no clubbing; right upper extremity lymphedema noted. PSYCH: alert & oriented x 3 with fluent speech NEURO: no focal motor/sensory deficits SKIN:   Right breast ulcerated/ scabbing lesion noted; no drainage. Right anterior [4cm] shoulder; shoulder lesions [ 4x4 cm in size] noted. Bandaged. No bleeding noted.    Labs:  Lab Results  Component Value Date   WBC 3.0 (L) 10/29/2016   HGB 8.8 (L) 10/29/2016   HCT 28.3 (L) 10/29/2016   MCV 85.3 10/29/2016   PLT 110 (L) 10/29/2016   NEUTROABS 2.3 10/29/2016    Lab Results  Component Value Date   NA 138 10/30/2016   K 4.0 10/30/2016   CL 105 10/30/2016   CO2 27 10/30/2016    Studies:   No results found.  Assessment & Plan:  61 year old female patient with metastatic breast cancer ER/PR positive HER-2/neu negative currently admitted to hospital for worsening discharge/bleeding from right chest /shoulder Malignant lesion  # Metastatic breast cancer ER/PR positive HER-2/neu negative- status post multiple lines of therapy currently on Abemacliclib + Faslodex. Blood counts from May 5- white count 3.0 hemoglobin 8.8 platelets 110. Question from CDK inhibitor/sepsis. Repeat stat CBC today.  # History of PE on Eliquis 5 twice a day- high risk for recurrent PE given active malignancy obesity. However if platelet counts are lower/or patient continues to have bleeding- dose of Eliquis could be reduced to 5 mg once a day.  # Malignant right shoulder wound- infection/sepsis improved post antibiotics. Patient could potentially be discharged on Augmentin twice a day for a total of 3 weeks.   # acute renal failure creatinine 1.5 baseline- 0.8/ poor by mouth intake and dehydration. Repeat labs today. If patient's blood pressures are borderline- recommend holding lisinopril at discharge. Again await BMP.  # above plan discussed with Dr. Posey Pronto. Patient could potentially be discharged today. Await today's labs.  Cammie Sickle, MD 10/31/2016  8:26 AM

## 2016-10-31 NOTE — Care Management Note (Signed)
Case Management Note  Patient Details  Name: GIONNI FREESE MRN: [id number] Date of Birth: 05-19-1956  Subjective/Objective:       Admitted to [facility] with the diagnosis of sepsis/history of breast cancer. Lives with husband, Herbie [place], [phone]). Prescriptions are filled at [facility]. Takes care of all basic and instrumental activities of daily living herself, drives. Home Health in the past per [facility]. CPAP per [place], no other equipment in the home. No skilled Nursing. No home oxygen. No falls. Better appetite. Husband will transport.            Action/Plan:  Wound Care:( Right arm chest ulcers)  Will need dressings changed Monday-Wednesday-Friday. Will be taught to change dressing prior to discharge.  Expected Discharge Date:  10/31/16               Expected Discharge Plan:     In-House Referral:     Discharge [facility] for dressing changes.   Post Acute Care Choice:    [facility] Choice offered to:   Yes  DME Arranged:    DME Agency:     HH Arranged:    HH Agency:     Status of Service:     If discussed at [place] of Stay Meetings, dates discussed:    Additional Comments:    Will check with Floydene Flock. [facility] representative to see if they can provide skilled nursing in [place].   Shelbie Ammons, RN MSN CCM Care Management [phone] 10/31/2016, 9:02 AM

## 2016-11-01 DIAGNOSIS — Z515 Encounter for palliative care: Secondary | ICD-10-CM

## 2016-11-01 DIAGNOSIS — Z7189 Other specified counseling: Secondary | ICD-10-CM

## 2016-11-01 LAB — TYPE AND SCREEN
ABO/RH(D): A POS
ANTIBODY SCREEN: NEGATIVE
UNIT DIVISION: 0

## 2016-11-01 LAB — BASIC METABOLIC PANEL
Anion gap: 5 (ref 5–15)
BUN: 24 mg/dL — ABNORMAL HIGH (ref 6–20)
CALCIUM: 7.7 mg/dL — AB (ref 8.9–10.3)
CHLORIDE: 111 mmol/L (ref 101–111)
CO2: 28 mmol/L (ref 22–32)
CREATININE: 2.57 mg/dL — AB (ref 0.44–1.00)
GFR calc non Af Amer: 19 mL/min — ABNORMAL LOW (ref >60)
GFR, EST AFRICAN AMERICAN: 23 mL/min — AB (ref >60)
Glucose, Bld: 123 mg/dL — ABNORMAL HIGH (ref 65–99)
Potassium: 3.8 mmol/L (ref 3.5–5.1)
SODIUM: 144 mmol/L (ref 135–145)

## 2016-11-01 LAB — BPAM RBC
BLOOD PRODUCT EXPIRATION DATE: 201805142359
ISSUE DATE / TIME: 201805071509
UNIT TYPE AND RH: 6200

## 2016-11-01 LAB — HEMOGLOBIN: HEMOGLOBIN: 7.2 g/dL — AB (ref 12.0–16.0)

## 2016-11-01 MED ORDER — LOPERAMIDE HCL 2 MG PO CAPS
2.0000 mg | ORAL_CAPSULE | Freq: Four times a day (QID) | ORAL | Status: DC | PRN
  Administered 2016-11-01: 2 mg via ORAL
  Filled 2016-11-01: qty 1

## 2016-11-01 MED ORDER — SODIUM CHLORIDE 0.9 % IV SOLN
INTRAVENOUS | Status: AC
  Administered 2016-11-01 (×2): via INTRAVENOUS

## 2016-11-01 NOTE — Progress Notes (Signed)
[facility], [place] 11/01/16  Subjective:  Patient is doing fair No acute complaints Denies shortness of breath States she is able to eat some Serum creatinine mildly worse compared to yesterday    Objective:  Vital signs in last 24 hours:  Temp:  [97.8 F (36.6 C)-99 F (37.2 C)] 97.8 F (36.6 C) (05/08 0523) Pulse Rate:  [87-98] 87 (05/08 0523) Resp:  [20] 20 (05/08 0523) BP: (100-121)/(47-56) 105/51 (05/08 0523) SpO2:  [97 %-100 %] 98 % (05/08 0523) Weight:  [136.9 kg (301 lb 11.2 oz)] 136.9 kg (301 lb 11.2 oz) (05/08 0500)  Weight change:  Filed Weights   10/29/16 0611 10/30/16 0447 11/01/16 0500  Weight: 134.5 kg (296 lb 8 oz) (!) 136.4 kg (300 lb 12.8 oz) (!) 136.9 kg (301 lb 11.2 oz)    Intake/Output:    Intake/Output Summary (Last 24 hours) at 11/01/16 1451 Last data filed at 10/31/16 1800  Gross per 24 hour  Intake              350 ml  Output                0 ml  Net              350 ml     Physical Exam: General: Morbidly obese lady, sitting up in bed  HEENT Moist oral mucous membranes  Neck supple  Pulm/lungs Normal breathing effort, limited exam, mild left basilar crackles  CVS/Heart tachycardic  Abdomen:  Obese, soft, nontender  Extremities: + pitting edema over lower legs  Neurologic: Alert, oriented  Skin: foul-smelling wounds of the right shoulder and arm          Basic Metabolic Panel:   Recent Labs Lab 10/29/16 0140 10/30/16 1002 10/31/16 0832 11/01/16 0432  NA 137 138 140 144  K 2.7* 4.0 4.0 3.8  CL 99* 105 104 111  CO2 27 27 29 28   GLUCOSE 144* 158* 123* 123*  BUN 15 14 21* 24*  CREATININE 1.23* 1.51* 2.42* 2.57*  CALCIUM 8.6* 8.0* 8.7* 7.7*  PHOS  --   --  3.8  --      CBC:  Recent Labs Lab 10/29/16 0140 10/31/16 0832 11/01/16 0432  WBC 3.0* 2.8*  --   NEUTROABS 2.3 1.9  --   HGB 8.8* 7.1* 7.2*  HCT 28.3* 22.8*  --   MCV 85.3 86.5  --   PLT 110* 110*  --      No results found  for: HEPBSAG, HEPBSAB, HEPBIGM    Microbiology:  Recent Results (from the past 240 hour(s))  Blood culture (routine x 2)     Status: None (Preliminary result)   Collection Time: 10/29/16  1:40 AM  Result Value Ref Range Status   Specimen Description BLOOD LEFT ANTECUBITAL  Final   Special Requests   Final    BOTTLES DRAWN AEROBIC AND ANAEROBIC Blood Culture results may not be optimal due to an excessive volume of blood received in culture bottles   Culture NO GROWTH 3 DAYS  Final   Report Status PENDING  Incomplete  Blood culture (routine x 2)     Status: None (Preliminary result)   Collection Time: 10/29/16  2:40 AM  Result Value Ref Range Status   Specimen Description BLOOD LEFT HAND  Final   Special Requests   Final    BOTTLES DRAWN AEROBIC AND ANAEROBIC Blood Culture adequate volume   Culture NO GROWTH 3 DAYS  Final  Report Status PENDING  Incomplete    Coagulation Studies: No results for input(s): LABPROT, INR in the last 72 hours.  Urinalysis:  Recent Labs  10/31/16 1931  COLORURINE YELLOW*  LABSPEC 1.006  PHURINE 7.0  [place] NEGATIVE  PROTEINUR NEGATIVE  NITRITE NEGATIVE  LEUKOCYTESUR NEGATIVE      Imaging: US Renal  Result Date: 10/31/2016 CLINICAL DATA:  Acute renal failure EXAM: RENAL / URINARY TRACT ULTRASOUND COMPLETE COMPARISON:  None. FINDINGS: Right Kidney: Length: 11.9 cm. Echogenicity within normal limits. No mass or hydronephrosis visualized. Left Kidney: Length: 13.9 cm. Echogenicity within normal limits. No mass or hydronephrosis visualized. Bladder: Not visualized. IMPRESSION: 1. Normal bilateral kidneys.  No obstructive uropathy. Electronically Signed   By: Kathreen Devoid   On: 10/31/2016 13:54     Medications:   . sodium chloride 75 mL/hr at 11/01/16 0931   . Abemaciclib  150 mg Oral Q12H  . amoxicillin-clavulanate  1 tablet Oral Q12H  . apixaban  5 mg Oral BID  . atorvastatin   40 mg Oral q1800  . calcium-vitamin D  1 tablet Oral Q breakfast  . docusate sodium  100 mg Oral BID  . feeding supplement (ENSURE ENLIVE)  237 mL Oral TID BM  . heparin lock flush  500 Units Intravenous Once  . loratadine  10 mg Oral Daily  . omega-3 acid ethyl esters  1 g Oral QHS  . phosphorus  250 mg Oral BID   acetaminophen, HYDROcodone-acetaminophen, loperamide, morphine injection, ondansetron **OR** ondansetron (ZOFRAN) IV  Assessment/ Plan:  61 y.o.caucasian  female with metastatic breast cancer treated with chemotherapy and radiation, lymphedema right arm with wounds,morbid obesity, history of pulmonary embolism requiring chronic anticoagulation , was admitted on 10/29/2016.   1.  Acute renal failure Patient's baseline creatinine is 0.68 from April 2018 Serum creatinine has progressively worsened blood rate of rise of creatinine has slowed down Patient is nonoliguric Electrolytes and volume status are acceptable.  No acute indication for dialysis Cause of acute renal failure is likely ATN from multiple factors including taking ACE inhibitors in the setting of volume depletion prior to admission.  It is possible that acute kidney injury was worsened with iv vancomycin - agree with discontinuing ACE inhibitor - Maintain good volume status; supportive care - Avoid nephrotoxic agents including ACE- inhibitors, IV contrast    LOS: 3 [facility] 5/8/20182:51 PM  [place], [place]

## 2016-11-01 NOTE — Progress Notes (Signed)
[place] [facility] [place] NAME: [name]    MR#:  [id number]  DATE OF BIRTH:  [birth year]  SUBJECTIVE:   No complaint except diarrhea due to chemo. REVIEW OF SYSTEMS:   Review of Systems  Constitutional: Negative for chills, fever and malaise/fatigue.  HENT: Negative for congestion.   Eyes: Negative for blurred vision and double vision.  Respiratory: Negative for cough, shortness of breath and stridor.   Cardiovascular: Negative for chest pain and leg swelling.  Gastrointestinal: Positive for diarrhea. Negative for abdominal pain, blood in stool, constipation, melena, nausea and vomiting.  Genitourinary: Negative for dysuria and hematuria.  Musculoskeletal: Negative for back pain.  Skin: Negative for itching and rash.  Neurological: Negative for dizziness, focal weakness and loss of consciousness.  Psychiatric/Behavioral: Negative for depression. The patient is not nervous/anxious.     DRUG ALLERGIES:   Allergies  Allergen Reactions  . No Known Allergies     VITALS:  Blood pressure (!) 105/51, pulse 87, temperature 97.8 F (36.6 C), temperature source Oral, resp. rate 20, height 5\' 5"  (1.651 m), weight (!) 301 lb 11.2 oz (136.9 kg), SpO2 98 %.  PHYSICAL EXAMINATION:   Physical Exam  GENERAL:  61 y.o.-year-old patient lying in the bed with no acute distress.  EYES: Pupils equal, round, reactive to light and accommodation. No scleral icterus. Extraocular muscles intact.  HEENT: Head atraumatic, normocephalic. Oropharynx and nasopharynx clear.  NECK:  Supple, no jugular venous distention. No thyroid enlargement, no tenderness.  LUNGS: Normal breath sounds bilaterally, no wheezing, rales, rhonchi. No use of accessory muscles of respiration. Right shoulder and chest in dressing. CARDIOVASCULAR: S1, S2 normal. No murmurs, rubs, or gallops.  ABDOMEN: Soft, nontender, nondistended. Bowel sounds present. No organomegaly or mass.   EXTREMITIES: No cyanosis, clubbing or edema b/l.    NEUROLOGIC: Cranial nerves II through XII are intact. No focal Motor or sensory deficits b/l.   PSYCHIATRIC:  patient is alert and oriented x 3.  SKIN: No obvious rash, lesion, or ulcer.   LABORATORY PANEL:  CBC  Recent Labs Lab 10/31/16 0832 11/01/16 0432  WBC 2.8*  --   HGB 7.1* 7.2*  HCT 22.8*  --   PLT 110*  --     Chemistries   Recent Labs Lab 10/29/16 0140  11/01/16 0432  NA 137  < > 144  K 2.7*  < > 3.8  CL 99*  < > 111  CO2 27  < > 28  GLUCOSE 144*  < > 123*  BUN 15  < > 24*  CREATININE 1.23*  < > 2.57*  CALCIUM 8.6*  < > 7.7*  AST 15  --   --   ALT 7*  --   --   ALKPHOS 42  --   --   BILITOT 0.7  --   --   < > = values in this interval not displayed. Cardiac Enzymes No results for input(s): TROPONINI in the last 168 hours. RADIOLOGY:  US Renal  Result Date: 10/31/2016 CLINICAL DATA:  Acute renal failure EXAM: RENAL / URINARY TRACT ULTRASOUND COMPLETE COMPARISON:  None. FINDINGS: Right Kidney: Length: 11.9 cm. Echogenicity within normal limits. No mass or hydronephrosis visualized. Left Kidney: Length: 13.9 cm. Echogenicity within normal limits. No mass or hydronephrosis visualized. Bladder: Not visualized. IMPRESSION: 1. Normal bilateral kidneys.  No obstructive uropathy. Electronically Signed   By: Kathreen Devoid   On: 10/31/2016 13:54   ASSESSMENT AND PLAN:  61 year old female admitted for sepsis.  1. Sepsis:present  On admisison - patient meets criteria via tachycardia, tachypnea and leukopenia.  -Source is certainly her infected wounds which are deep and draining purulent material. She states that the drainage is worse and distinctly different than sloughing of skin secondary to radiation.  -ON IV Vancomycin and Zosyn ---changed to oral augmentin -The wounds extend into her axilla and chest wall -Wound care consult appreciated. Follow wound dressings and HHRN to help with it after d/c  2. Acute  kidney injury: likely ATN from multiple factors including taking ACE inhibitors in the setting of volume depletion prior to admission.  continue intravenous fluid. Avoid nephrotoxic agents. -received IVF appears euvolemic -baseline creat 0.9 -came in with 1.23---1.5--2.42-2.57 -hold lasix and lisinopril per Dr Candiss Norse. f/u BMP.  3. Hypokalemia: Replete potassium. K 4.0  4. 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 The former lesions are stable in the latter is mildly reduced in size. -Continue Abemacicalib -appreciate oncology input  5. Hypertension: with relative hypotension -hold BP meds  6. CHF: hold Lasix. Pt is not in HF  7. DVT prophylaxis: Eliquis for pulmonary embolism found incidentally on staging CT scan  8.Pancytopenia with h/o anemia  -came in with Hgb 8.8---7.1, s/p transfuse 1 unit pack cell, Hb 7.2. -no active bleeding from ulcer site. f/u Hb in am.  Case discussed with Care Management/Social Worker. Management plans discussed with the patient, her husband and they are in agreement.  CODE STATUS: full  DVT Prophylaxis: eliquis  TOTAL TIME TAKING CARE OF THIS PATIENT: 35 minutes.  >50% time spent on counselling and coordination of care  POSSIBLE D/C IN 1-2 DAYS, DEPENDING ON CLINICAL CONDITION.  Note: This dictation was prepared with Dragon dictation along with smaller phrase technology. Any transcriptional errors that result from this process are unintentional.  Demetrios Loll M.D on 11/01/2016 at 3:56 PM  Between 7am to 6pm - Pager - [phone]  After 6pm go to [email or web address] - password [email or web address]  Office  [phone]  CC: Primary care physician; Kirk Ruths, MD

## 2016-11-01 NOTE — Consult Note (Signed)
Consultation Note Date: 11/01/2016   Patient Name: [name]  DOB: 1956-06-06  MRN: [id number]  Age / Sex: 61 y.o., female  PCP: Kirk Ruths, MD Referring Physician: Demetrios Loll, MD  Reason for Consultation: Establishing goals of care  HPI/Patient Profile: 61 y.o. female  with past medical history of HTN, anemia, MI, sleep apnea, Stage IV breast cancer (met to spine, chest wall, adrenal gland, fungating R shoulder/chest wall mass) with admitted on 10/29/2016 with infected ulcer to R shoulder and sepsis- there was concern for gangrenous limb without possibility of amputation due to involvement of chest wall. Now with acute kidney injury. Palliative medicine consulted for [place].   Clinical Assessment and Goals of Care: Met with patient and her spouse, Mortimer Fries. Introduced Palliative medicine. Palliative medicine is specialized medical care for people living with serious illness. It focuses on providing relief from the symptoms and stress of a serious illness. The goal is to improve quality of life for both the patient and the family.  Patient sitting up, no complaints, no pain, sleeping, eating well. Derives joy in life from spending time at home with her family- most notably her grandchild and children. Prior to admission she was living independently at home with her spouse. Completing ADL's on her own, cooking and cleaning, maintaining a good quality of life.  She is aware of her advanced cancer and that the [place] of her current treatments (chemo and radtx) are palliative in nature- not to cure, but to limit progression, control symptoms and add time to her life. We discussed her arm and wounds and she tells me the GOC are for the wounds to heal. She states that for now if an infection were to reoccur and she is aware that is likely- she is willing to come back and be treated for that. She is maintaining good  quality of life and this treatment is enabling her to maintain that quality of life.   We discussed advanced care planning. She hasn't put much thought into. We reviewed advanced healthcare directives. A copy of advanced care directives was given for her review. Discussed code status. I asked her, "If she were to die, her heart were to stop and she experienced a peaceful death, would she want to be brought back?" She is unsure of this right now- states she would like to discuss this with her family. Gave her and her spouse emotional support while discussing difficult topic. A copy of "Hard Choices" book was also left.    Primary Decision Maker PATIENT    SUMMARY OF RECOMMENDATIONS -Continue current level of care -PMT will check in tomorrow to f/u- this was an introductory, information providing and rapport establishing visit - I may recommend home Palliative followup at tomorrow's visit  Code Status/Advance Care Planning:  Full code   Prognosis:    Unable to determine  Discharge Planning: To Be Determined  Primary Diagnoses: Present on Admission: . Sepsis ([place])   I have reviewed the medical record, interviewed the patient and family, and  examined the patient. The following aspects are pertinent.  Past Medical History:  Diagnosis Date  . Anemia   . Anginal pain ([place])   . Breast cancer ([place])    2014   . Breast cancer metastasized to bone ([place]) 11/21/2014  . Hypertension   . Last menstrual period (LMP) > 10 days ago 2007  . Lymphedema of right upper extremity   . Myocardial infarction ([place])   . Post-menopausal 2006  . Sleep apnea   . Wound drainage    Social History   Social History  . Marital status: Married    Spouse name: N/A  . Number of children: N/A  . Years of education: N/A   Social History Main Topics  . Smoking status: Former Smoker    Packs/day: 1.00    Types: Cigarettes    Quit date: 10/25/2004  . Smokeless tobacco: Never Used     Comment: stopped  smoking greater than 7 years ago  . Alcohol use No  . Drug use: No  . Sexual activity: Not Currently   Other Topics Concern  . None   Social History Narrative  . None   Family History  Problem Relation Age of Onset  . Atrial fibrillation Sister   . Hypertension Sister    Scheduled Meds: . Abemaciclib  150 mg Oral Q12H  . amoxicillin-clavulanate  1 tablet Oral Q12H  . apixaban  5 mg Oral BID  . atorvastatin  40 mg Oral q1800  . calcium-vitamin D  1 tablet Oral Q breakfast  . docusate sodium  100 mg Oral BID  . feeding supplement (ENSURE ENLIVE)  237 mL Oral TID BM  . heparin lock flush  500 Units Intravenous Once  . loratadine  10 mg Oral Daily  . omega-3 acid ethyl esters  1 g Oral QHS  . phosphorus  250 mg Oral BID   Continuous Infusions: . sodium chloride     PRN Meds:.acetaminophen, HYDROcodone-acetaminophen, loperamide, morphine injection, ondansetron **OR** ondansetron (ZOFRAN) IV Medications Prior to Admission:  Prior to Admission medications   Medication Sig Start Date End Date Taking? Authorizing Provider  Abemaciclib 150 MG TABS Take 150 mg by mouth every 12 (twelve) hours. 09/12/16  Yes Cammie Sickle, MD  acetaminophen (TYLENOL) 500 MG tablet Take 1,000 mg by mouth every 6 (six) hours as needed.   Yes [provider]  [employer] CALCIUM 600+D 600-800 MG-UNIT TABS Take 1 tablet by mouth daily. At noon 10/03/14  Yes [provider]  ELIQUIS 5 MG TABS tablet TAKE 1 TABLET BY MOUTH TWICE DAILY 10/13/16  Yes Cammie Sickle, MD  fexofenadine (ALLEGRA) 180 MG tablet Take 180 mg by mouth daily.    Yes [provider]  furosemide (LASIX) 20 MG tablet Take 20 mg by mouth daily. 10/17/14  Yes [provider]  gabapentin (NEURONTIN) 300 MG capsule TAKE 1 CAPSULE BY MOUTH ONCE DAILY IN THE MORNING AND TAKE 2 CAPSULES BY MOUTH IN THE EVENING 09/06/16  Yes Cammie Sickle, MD  HYDROcodone-acetaminophen (NORCO) 5-325 MG tablet Take 1  tablet by mouth every 6 (six) hours as needed for moderate pain (every 6 to 8 hrs prn pain). 10/19/16  Yes Cammie Sickle, MD  lisinopril (PRINIVIL,ZESTRIL) 20 MG tablet Take 20 mg by mouth daily.   Yes [provider]  metoprolol succinate (TOPROL-XL) 50 MG 24 hr tablet Take 50 mg by mouth 2 (two) times daily. Take with or immediately following a meal.   Yes [provider]  omega-3 acid ethyl esters (LOVAZA) 1 G capsule Take 1 g by mouth at bedtime.   Yes [provider]  phosphorus (PHOSPHA 250 NEUTRAL) 155-852-130 MG tablet Take 1 tablet (250 mg total) by mouth 2 (two) times daily. 08/15/16  Yes Cammie Sickle, MD  potassium chloride (K-DUR) 10 MEQ tablet Take 10 mEq by mouth daily.  10/17/14  Yes [provider]  simvastatin (ZOCOR) 80 MG tablet Take 80 mg by mouth at bedtime.   Yes [provider]  amoxicillin-clavulanate (AUGMENTIN) 875-125 MG tablet Take 1 tablet by mouth every 12 (twelve) hours. 10/31/16 11/05/16  Fritzi Mandes, MD  feeding supplement, ENSURE ENLIVE, (ENSURE ENLIVE) LIQD Take 237 mLs by mouth 3 (three) times daily between meals. 10/31/16   Fritzi Mandes, MD  ondansetron (ZOFRAN) 8 MG tablet TAKE 1 TABLET (8MG) BY MOUTH TWICE DAILY START THE DAY AFTER CHEMOTHERAPY FOR 2 DAYS THEN TAKE AS NEEDED FOR NAUSEA OR VOMITING Patient not taking: Reported on 10/19/2016 12/21/15   Cammie Sickle, MD   Allergies  Allergen Reactions  . No Known Allergies    Review of Systems  Constitutional: Positive for activity change. Negative for unexpected weight change.  Respiratory: Negative for shortness of breath.   Cardiovascular: Negative for chest pain.  Skin: Positive for wound.  Psychiatric/Behavioral: Negative for dysphoric mood and sleep disturbance.  All other systems reviewed and are negative.   Physical Exam  Constitutional: She appears well-developed and well-nourished.  Cardiovascular: Regular rhythm.   Pulmonary/Chest:  Effort normal.  Abdominal: Soft.  Skin:  Dressed wound to R shoulder, lymphedema sleeve to R arm  Psychiatric: She has a normal mood and affect. Her behavior is normal. Judgment and thought content normal.  Nursing note and vitals reviewed.   Vital Signs: BP (!) 105/51 (BP Location: Left Arm)   Pulse 87   Temp 97.8 F (36.6 C) (Oral)   Resp 20   Ht 5' 5"  (1.651 m)   Wt (!) 136.9 kg (301 lb 11.2 oz)   SpO2 98%   BMI 50.21 kg/m  Pain Assessment: 0-10   Pain Score: 0-No pain   SpO2: SpO2: 98 % O2 Device:SpO2: 98 % O2 Flow Rate: .   IO: Intake/output summary:  Intake/Output Summary (Last 24 hours) at 11/01/16 0045 Last data filed at 10/31/16 1800  Gross per 24 hour  Intake              490 ml  Output                0 ml  Net              490 ml    LBM: Last BM Date: 10/31/16 Baseline Weight: Weight: (!) 138.3 kg (305 lb) Most recent weight: Weight: (!) 136.9 kg (301 lb 11.2 oz)     Palliative Assessment/Data:PPS 60%   Flowsheet Rows     Most Recent Value  Intake Tab  Referral Department  Hospitalist  Unit at Time of Referral  Oncology Unit  Palliative Care Primary Diagnosis  Cancer  Date Notified  10/29/16  Palliative Care Type  Not seen  Reason for referral  Clarify Goals of Care  Date of Admission  10/29/16  # of days IP prior to Palliative referral  0  Clinical Assessment  Psychosocial & Spiritual Assessment  Palliative Care Outcomes  Actual Discharge Date  10/31/16 [facility] health]      Thank you for this consult. Palliative medicine will continue to  follow and assist as needed.   Time In:0900 Time Out: 1000 Time Total: 60 minutes Greater than 50%  of this time was spent counseling and coordinating care related to the above assessment and plan.  Signed by: Mariana Kaufman, AGNP-C Palliative Medicine    Please contact Palliative Medicine Team phone at [phone] for questions and concerns.  For individual provider: See Shea Evans

## 2016-11-02 ENCOUNTER — Ambulatory Visit: Admission: RE | Admit: 2016-11-02 | Payer: BC Managed Care – PPO | Source: Ambulatory Visit

## 2016-11-02 DIAGNOSIS — D61818 Other pancytopenia: Secondary | ICD-10-CM

## 2016-11-02 DIAGNOSIS — L98499 Non-pressure chronic ulcer of skin of other sites with unspecified severity: Secondary | ICD-10-CM

## 2016-11-02 LAB — BASIC METABOLIC PANEL
ANION GAP: 8 (ref 5–15)
BUN: 23 mg/dL — AB (ref 6–20)
CALCIUM: 8.3 mg/dL — AB (ref 8.9–10.3)
CO2: 28 mmol/L (ref 22–32)
CREATININE: 2.75 mg/dL — AB (ref 0.44–1.00)
Chloride: 108 mmol/L (ref 101–111)
GFR calc Af Amer: 21 mL/min — ABNORMAL LOW (ref >60)
GFR calc non Af Amer: 18 mL/min — ABNORMAL LOW (ref >60)
Glucose, Bld: 128 mg/dL — ABNORMAL HIGH (ref 65–99)
POTASSIUM: 4.1 mmol/L (ref 3.5–5.1)
Sodium: 144 mmol/L (ref 135–145)

## 2016-11-02 LAB — HEMOGLOBIN: HEMOGLOBIN: 7.4 g/dL — AB (ref 12.0–16.0)

## 2016-11-02 MED ORDER — SODIUM CHLORIDE 0.9 % IV SOLN
INTRAVENOUS | Status: DC
  Administered 2016-11-02 – 2016-11-03 (×2): via INTRAVENOUS

## 2016-11-02 NOTE — Progress Notes (Signed)
[facility], [place] 11/02/16  Subjective:  Patient is doing fair No acute complaints Denies shortness of breath States she is able to eat Without nausea or vomiting Serum creatinine slightly worse compared to yesterday    Objective:  Vital signs in last 24 hours:  Temp:  [98 F (36.7 C)-98.3 F (36.8 C)] 98.3 F (36.8 C) (05/09 0428) Pulse Rate:  [89-100] 100 (05/09 0428) Resp:  [20] 20 (05/09 0428) BP: (106-113)/(47-51) 113/47 (05/09 0428) SpO2:  [98 %-100 %] 98 % (05/09 0428) Weight:  [137.6 kg (303 lb 4.8 oz)] 137.6 kg (303 lb 4.8 oz) (05/09 0500)  Weight change: 0.726 kg (1 lb 9.6 oz) Filed Weights   10/30/16 0447 11/01/16 0500 11/02/16 0500  Weight: (!) 136.4 kg (300 lb 12.8 oz) (!) 136.9 kg (301 lb 11.2 oz) (!) 137.6 kg (303 lb 4.8 oz)    Intake/Output:    Intake/Output Summary (Last 24 hours) at 11/02/16 0955 Last data filed at 11/02/16 0800  Gross per 24 hour  Intake           1546.5 ml  Output                0 ml  Net           1546.5 ml     Physical Exam: General: Morbidly obese lady, sitting up in Chair   HEENT Moist oral mucous membranes  Neck supple  Pulm/lungs Normal breathing effort, limited exam,   CVS/Heart regular  Abdomen:  Obese, soft, nontender  Extremities: + pitting edema over lower legs  Neurologic: Alert, oriented  Skin: foul-smelling wounds of the right shoulder and arm          Basic Metabolic Panel:   Recent Labs Lab 10/29/16 0140 10/30/16 1002 10/31/16 0832 11/01/16 0432 11/02/16 0602  NA 137 138 140 144 144  K 2.7* 4.0 4.0 3.8 4.1  CL 99* 105 104 111 108  CO2 27 27 29 28 28   GLUCOSE 144* 158* 123* 123* 128*  BUN 15 14 21* 24* 23*  CREATININE 1.23* 1.51* 2.42* 2.57* 2.75*  CALCIUM 8.6* 8.0* 8.7* 7.7* 8.3*  PHOS  --   --  3.8  --   --      CBC:  Recent Labs Lab 10/29/16 0140 10/31/16 0832 11/01/16 0432 11/02/16 0602  WBC 3.0* 2.8*  --   --   NEUTROABS 2.3 1.9  --   --   HGB  8.8* 7.1* 7.2* 7.4*  HCT 28.3* 22.8*  --   --   MCV 85.3 86.5  --   --   PLT 110* 110*  --   --      No results found for: HEPBSAG, HEPBSAB, HEPBIGM    Microbiology:  Recent Results (from the past 240 hour(s))  Blood culture (routine x 2)     Status: None (Preliminary result)   Collection Time: 10/29/16  1:40 AM  Result Value Ref Range Status   Specimen Description BLOOD LEFT ANTECUBITAL  Final   Special Requests   Final    BOTTLES DRAWN AEROBIC AND ANAEROBIC Blood Culture results may not be optimal due to an excessive volume of blood received in culture bottles   Culture NO GROWTH 4 DAYS  Final   Report Status PENDING  Incomplete  Blood culture (routine x 2)     Status: None (Preliminary result)   Collection Time: 10/29/16  2:40 AM  Result Value Ref Range Status   Specimen Description BLOOD  LEFT HAND  Final   Special Requests   Final    BOTTLES DRAWN AEROBIC AND ANAEROBIC Blood Culture adequate volume   Culture NO GROWTH 4 DAYS  Final   Report Status PENDING  Incomplete    Coagulation Studies: No results for input(s): LABPROT, INR in the last 72 hours.  Urinalysis:  Recent Labs  10/31/16 1931  COLORURINE YELLOW*  LABSPEC 1.006  PHURINE 7.0  [place] NEGATIVE  PROTEINUR NEGATIVE  NITRITE NEGATIVE  LEUKOCYTESUR NEGATIVE      Imaging: US Renal  Result Date: 10/31/2016 CLINICAL DATA:  Acute renal failure EXAM: RENAL / URINARY TRACT ULTRASOUND COMPLETE COMPARISON:  None. FINDINGS: Right Kidney: Length: 11.9 cm. Echogenicity within normal limits. No mass or hydronephrosis visualized. Left Kidney: Length: 13.9 cm. Echogenicity within normal limits. No mass or hydronephrosis visualized. Bladder: Not visualized. IMPRESSION: 1. Normal bilateral kidneys.  No obstructive uropathy. Electronically Signed   By: Kathreen Devoid   On: 10/31/2016 13:54     Medications:    . Abemaciclib  150 mg Oral Q12H  .  amoxicillin-clavulanate  1 tablet Oral Q12H  . apixaban  5 mg Oral BID  . atorvastatin  40 mg Oral q1800  . calcium-vitamin D  1 tablet Oral Q breakfast  . docusate sodium  100 mg Oral BID  . feeding supplement (ENSURE ENLIVE)  237 mL Oral TID BM  . heparin lock flush  500 Units Intravenous Once  . loratadine  10 mg Oral Daily  . omega-3 acid ethyl esters  1 g Oral QHS  . phosphorus  250 mg Oral BID   acetaminophen, HYDROcodone-acetaminophen, loperamide, morphine injection, ondansetron **OR** ondansetron (ZOFRAN) IV  Assessment/ Plan:  61 y.o.caucasian  female with metastatic breast cancer treated with chemotherapy and radiation, lymphedema right arm with wounds,morbid obesity, history of pulmonary embolism requiring chronic anticoagulation , was admitted on 10/29/2016.   1.  Acute renal failure Patient's baseline creatinine is 0.68 from April 2018 Serum creatinine has progressively worsened but the rate of rise of creatinine has slowed down Patient is nonoliguric Electrolytes and volume status are acceptable.  No acute indication for dialysis Cause of acute renal failure is likely ATN from multiple factors including taking ACE inhibitors in the setting of volume depletion prior to admission.  It is possible that acute kidney injury was worsened with iv vancomycin  plan - continue to hold ACE inhibitor - Maintain good volume status; supportive care - Avoid nephrotoxic agents including ACE- inhibitors, IV contrast    LOS: 4 [facility] 5/9/20189:55 [place], [place]

## 2016-11-02 NOTE — Progress Notes (Signed)
[place] at [place] NAME: [name]    MR#:  [id number]  DATE OF BIRTH:  1956-02-25  SUBJECTIVE:   No complaint. REVIEW OF SYSTEMS:   Review of Systems  Constitutional: Negative for chills, fever and malaise/fatigue.  HENT: Negative for congestion.   Eyes: Negative for blurred vision and double vision.  Respiratory: Negative for cough, shortness of breath and stridor.   Cardiovascular: Negative for chest pain and leg swelling.  Gastrointestinal: Negative for abdominal pain, blood in stool, constipation, diarrhea, melena, nausea and vomiting.  Genitourinary: Negative for dysuria and hematuria.  Musculoskeletal: Negative for back pain.  Skin: Negative for itching and rash.  Neurological: Negative for dizziness, focal weakness and loss of consciousness.  Psychiatric/Behavioral: Negative for depression. The patient is not nervous/anxious.     DRUG ALLERGIES:   Allergies  Allergen Reactions  . No Known Allergies     VITALS:  Blood pressure (!) 113/47, pulse 100, temperature 98.3 F (36.8 C), temperature source Oral, resp. rate 20, height 5\' 5"  (1.651 m), weight (!) 303 lb 4.8 oz (137.6 kg), SpO2 98 %.  PHYSICAL EXAMINATION:   Physical Exam  GENERAL:  61 y.o.-year-old patient lying in the bed with no acute distress.  EYES: Pupils equal, round, reactive to light and accommodation. No scleral icterus. Extraocular muscles intact.  HEENT: Head atraumatic, normocephalic. Oropharynx and nasopharynx clear.  NECK:  Supple, no jugular venous distention. No thyroid enlargement, no tenderness.  LUNGS: Normal breath sounds bilaterally, no wheezing, rales, rhonchi. No use of accessory muscles of respiration. Right shoulder and chest in dressing. CARDIOVASCULAR: S1, S2 normal. No murmurs, rubs, or gallops.  ABDOMEN: Soft, nontender, nondistended. Bowel sounds present. No organomegaly or mass.  EXTREMITIES: No cyanosis, clubbing or  edema b/l.    NEUROLOGIC: Cranial nerves II through XII are intact. No focal Motor or sensory deficits b/l.   PSYCHIATRIC:  patient is alert and oriented x 3.  SKIN: No obvious rash, lesion, or ulcer.   LABORATORY PANEL:  CBC  Recent Labs Lab 10/31/16 0832  11/02/16 0602  WBC 2.8*  --   --   HGB 7.1*  < > 7.4*  HCT 22.8*  --   --   PLT 110*  --   --   < > = values in this interval not displayed.  Chemistries   Recent Labs Lab 10/29/16 0140  11/02/16 0602  NA 137  < > 144  K 2.7*  < > 4.1  CL 99*  < > 108  CO2 27  < > 28  GLUCOSE 144*  < > 128*  BUN 15  < > 23*  CREATININE 1.23*  < > 2.75*  CALCIUM 8.6*  < > 8.3*  AST 15  --   --   ALT 7*  --   --   ALKPHOS 42  --   --   BILITOT 0.7  --   --   < > = values in this interval not displayed. Cardiac Enzymes No results for input(s): TROPONINI in the last 168 hours. RADIOLOGY:  US Renal  Result Date: 10/31/2016 CLINICAL DATA:  Acute renal failure EXAM: RENAL / URINARY TRACT ULTRASOUND COMPLETE COMPARISON:  None. FINDINGS: Right Kidney: Length: 11.9 cm. Echogenicity within normal limits. No mass or hydronephrosis visualized. Left Kidney: Length: 13.9 cm. Echogenicity within normal limits. No mass or hydronephrosis visualized. Bladder: Not visualized. IMPRESSION: 1. Normal bilateral kidneys.  No obstructive uropathy. Electronically Signed   By:  Kathreen Devoid   On: 10/31/2016 13:54   ASSESSMENT AND PLAN:   61 year old female admitted for sepsis.  1. Sepsis: presented on admisison - patient meets criteria via tachycardia, tachypnea and leukopenia.  -Source is certainly her infected wounds which are deep and draining purulent material. She states that the drainage is worse and distinctly different than sloughing of skin secondary to radiation.  -ON IV Vancomycin and Zosyn ---changed to oral augmentin -The wounds extend into her axilla and chest wall -Wound care consult appreciated. Follow wound dressings and HHRN to help with  it after d/c. No indication for surgery but recommend the addition of physical therapy to assist with at least passive movement of her right upper extremity per Dr. Adonis Huguenin.  2. Acute kidney injury: likely ATN from multiple factors including taking ACE inhibitors in the setting of volume depletion prior to admission.  -received IVF appears euvolemic -baseline creat 0.9 -came in with 1.23---1.5--2.42-2.57-2.75. -hold lasix and lisinopril per Dr Candiss Norse. continue intravenous fluid. Avoid nephrotoxic agents, f/u BMP.  3. Hypokalemia: Replete potassium. K 4.1  4. 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 The former lesions are stable in the latter is mildly reduced in size. -Continue Abemacicalib -appreciate oncology input  5. Hypertension: with relative hypotension -hold BP meds  6. CHF: hold Lasix. Pt is not in HF  7. DVT prophylaxis: Eliquis for pulmonary embolism found incidentally on staging CT scan  8.Pancytopenia with h/o anemia  -came in with Hgb 8.8---7.1, s/p transfusion 1 unit pack cell, Hb 7.4. -no active bleeding from ulcer site.  Case discussed with Care Management/Social Worker. Management plans discussed with the patient, her husband and sister and they are in agreement.  CODE STATUS: full  DVT Prophylaxis: eliquis  TOTAL TIME TAKING CARE OF THIS PATIENT: 35 minutes.  >50% time spent on counselling and coordination of care  POSSIBLE D/C IN 1-2 DAYS, DEPENDING ON CLINICAL CONDITION.  Note: This dictation was prepared with Dragon dictation along with smaller phrase technology. Any transcriptional errors that result from this process are unintentional.  Demetrios Loll M.D on 11/02/2016 at 1:11 PM  Between 7am to 6pm - Pager - [phone]  After 6pm go to [email or web address] - password [email or web address] [place] [facility]  Office  [phone]  CC: Primary care physician; Kirk Ruths, MD

## 2016-11-02 NOTE — Progress Notes (Signed)
Daily Progress Note   Patient Name: [name]       Date: 11/02/2016 DOB: 07-01-1955  Age: 61 y.o. MRN#: [id number] Attending Physician: Demetrios Loll, MD Primary Care Physician: Kirk Ruths, MD Admit Date: 10/29/2016  Reason for Consultation/Follow-up: Establishing goals of care  Subjective: Met again with patient. She had no questions regarding our advance care planning discussion yesterday. She expressed some stress burden related to trying to maintain positive attitude for her family. Gave her space and emotional support to express feelings.  Discussed concern regarding her R arm that is pulseless, possibly gangrenous. She is overwhelmed with multiple medical problems including acute kidney failure. Her main GOC currently is to reestablish kidney function and then she hopes for surgical consult for arm. She states she is not ready to "give up". We discussed the difference between giving up vs changing [place].  We discussed gift vs curse of long term chronic illnesses- living long, but facing fears and decisions and unknowns.  Offered support of palliative care in community at discharge. She agreed to this and also requested palliative continued to visit while she is hospitalized.   ROS  Length of Stay: 4  Current Medications: Scheduled Meds:  . Abemaciclib  150 mg Oral Q12H  . amoxicillin-clavulanate  1 tablet Oral Q12H  . apixaban  5 mg Oral BID  . atorvastatin  40 mg Oral q1800  . calcium-vitamin D  1 tablet Oral Q breakfast  . docusate sodium  100 mg Oral BID  . feeding supplement (ENSURE ENLIVE)  237 mL Oral TID BM  . heparin lock flush  500 Units Intravenous Once  . loratadine  10 mg Oral Daily  . omega-3 acid ethyl esters  1 g Oral QHS  . phosphorus  250 mg Oral BID     Continuous Infusions:   PRN Meds: acetaminophen, HYDROcodone-acetaminophen, loperamide, morphine injection, ondansetron **OR** ondansetron (ZOFRAN) IV  Physical Exam          Vital Signs: BP (!) 113/47 (BP Location: Left Arm)   Pulse 100   Temp 98.3 F (36.8 C) (Oral)   Resp 20   Ht _0  (1.651 m)   Wt (!) 137.6 kg (303 lb 4.8 oz)   SpO2 98%   BMI 50.47 kg/m  SpO2: SpO2: 98 % O2 Device: O2  Device: Not Delivered O2 Flow Rate:    Intake/output summary:  Intake/Output Summary (Last 24 hours) at 11/02/16 0929 Last data filed at 11/02/16 0610  Gross per 24 hour  Intake           1546.5 ml  Output                0 ml  Net           1546.5 ml   LBM: Last BM Date: 11/01/16 Baseline Weight: Weight: (!) 138.3 kg (305 lb) Most recent weight: Weight: (!) 137.6 kg (303 lb 4.8 oz)       Palliative Assessment/Data: PPS: 60%   Flowsheet Rows     Most Recent Value  Intake Tab  Referral Department  Hospitalist  Unit at Time of Referral  Oncology Unit  Palliative Care Primary Diagnosis  Cancer  Date Notified  10/29/16  Palliative Care Type  Not seen  Reason for referral  Clarify Goals of Care  Date of Admission  10/29/16  # of days IP prior to Palliative referral  0  Clinical Assessment  Psychosocial & Spiritual Assessment  Palliative Care Outcomes  Actual Discharge Date  10/31/16 [facility]]      Patient Active Problem List   Diagnosis Date Noted  . Advance care planning   . Palliative care by specialist   . Sepsis ([place]) 10/29/2016  . Counseling regarding goals of care 10/03/2016  . Portacath in place 09/12/2016  . Carcinoma of overlapping sites of right breast in female, estrogen receptor positive ([place]) 04/11/2016  . Pulmonary emboli ([place]) 02/08/2016  . Metastasis to bone ([place]) 12/09/2015  . 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 Lymphedema of upper extremity 12/09/2015  . Last menstrual period (LMP) > 10 days ago   . Post-menopausal   . CAD  in native artery 01/28/2015  . Diabetes mellitus, type 2 ([place]) 01/28/2015  . Obstructive apnea 01/28/2015  . Type 2 diabetes mellitus ([place]) 01/28/2015  . Breast cancer metastasized to bone [facility]) 11/21/2014    Palliative Care Assessment & Plan   Patient Profile: 61 y.o. female  with past medical history of HTN, anemia, MI, sleep apnea, Stage IV breast cancer (met to spine, chest wall, adrenal gland, fungating R shoulder/chest wall mass) with admitted on 10/29/2016 with infected ulcer to R shoulder and sepsis- there was concern for gangrenous limb without possibility of amputation due to involvement of chest wall. Now with acute kidney injury. Palliative medicine consulted for [place].  Assessment/Recommendations/Plan   PMT will continue to follow while patient is hospitalized  Goals of Care and Additional Recommendations:  Limitations on Scope of Treatment: Full Scope Treatment  Code Status:  Full code  Prognosis:   Unable to determine  Discharge Planning:  Home with Palliative Services  Care plan was discussed with patient.   Thank you for allowing the Palliative Medicine Team to assist in the care of this patient.   Time In: 0845 Time Out: 0930 Total Time 45 mins Prolonged Time Billed no      Greater than 50%  of this time was spent counseling and coordinating care related to the above assessment and plan.  Mariana Kaufman, AGNP-C Palliative Medicine   Please contact Palliative Medicine Team phone at [phone] for questions and concerns.

## 2016-11-02 NOTE — Progress Notes (Signed)
[name]   DOB:Apr 22, 1956   LK#:[id number]    Subjective: Metastatic breast cancer.  Patient's foul-smelling discharge from her metastatic malignancy to the shoulder improved. Bleeding resolved. She is currently bandaged. She feels much better since admission. Eating and drinking better. She has been urinating well. She is currently getting IV fluids. Blood pressures improved. Had episode of diarrhea yesterday. Currently improved. No fevers or chills.     Objective:  Vitals:   11/02/16 0428 11/02/16 1545  BP: (!) 113/47 (!) 113/57  Pulse: 100 88  Resp: 20   Temp: 98.3 F (36.8 C) 98.2 F (36.8 C)     Intake/Output Summary (Last 24 hours) at 11/02/16 1709 Last data filed at 11/02/16 1200  Gross per 24 hour  Intake           1546.5 ml  Output                0 ml  Net           1546.5 ml    GENERAL: Well-nourished well-developed; Alert, no distress and comfortable.  She is Accompanied by her husband. She Is sitting in a chair.  Morbidly obese. EYES: no pallor or icterus OROPHARYNX: no thrush or ulceration;  NECK: supple, no masses felt LYMPH:  no palpable lymphadenopathy in the cervical, axillary or inguinal regions LUNGS: clear to auscultation and  No wheeze or crackles HEART/CVS: regular rate & rhythm and no murmurs; chronic bilateral lower extremity edema. ABDOMEN:abdomen soft, non-tender and normal bowel sounds Musculoskeletal:no cyanosis of digits and no clubbing; right upper extremity lymphedema noted. PSYCH: alert & oriented x 3 with fluent speech NEURO: no focal motor/sensory deficits SKIN:   Right breast ulcerated/ scabbing lesion noted; no drainage. Right anterior [4cm] shoulder; shoulder lesions [ 4x4 cm in size] noted. Bandaged. No bleeding noted.    Labs:  Lab Results  Component Value Date   WBC 2.8 (L) 10/31/2016   HGB 7.4 (L) 11/02/2016   HCT 22.8 (L) 10/31/2016   MCV 86.5 10/31/2016   PLT 110 (L) 10/31/2016   NEUTROABS 1.9 10/31/2016    Lab  Results  Component Value Date   NA 144 11/02/2016   K 4.1 11/02/2016   CL 108 11/02/2016   CO2 28 11/02/2016    Studies:  No results found.  Assessment & Plan:   61 year old female patient with metastatic breast cancer ER/PR positive HER-2/neu negative currently admitted to hospital for worsening discharge/bleeding from right chest /shoulder Malignant lesion- hospital course complicated by acute renal failure.   # Metastatic breast cancer ER/PR positive HER-2/neu negative- status post multiple lines of therapy currently on Abemacliclib + Faslodex. Given the acute renal failure/ drop in blood counts- recommend holding Abemaciclib. Discontinued for now. Discussed with the patient.  # Acute renal failure- base and creatinine 0.8 currently 2.7; appreciate nephrology evaluation/recommendations.   # Pancytopenia- white count 2.8 hemoglobin 7.4 platelets 110. Recommend holding Abemaciclib. Status post 1 unit of transfusion  # History of PE on Eliquis 5 twice a day- high risk for recurrent PE given active malignancy obesity.   # Malignant right shoulder wound- infection/sepsis improved post antibiotics. Patient could potentially be discharged on Augmentin twice a day for a total of 3 weeks.    Cammie Sickle, MD 11/02/2016  5:09 PM

## 2016-11-02 NOTE — Consult Note (Signed)
Patient ID: [name], female   DOB: 11-27-1955, 61 y.o.   MRN: [id number]  CC: Arm/Back Wounds  HPI [name] is a 61 y.o. female who is currently admitted to the medicine service for treatment of sepsis and acute kidney injury. General surgery consult requested by Dr. Bridgett Larsson for evaluation of multiple wounds to her right upper extremity, back, axilla, breast. 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 Patient states that the majority of them are insensate with the exception of the wound and the fold of her bicep. It is a sharp pain whenever it is moved or touch. She states she is unable to move her arm herself. Her only current complaint is of diarrhea thought to be secondary to chemotherapy. She denies any fevers, chills, nausea, vomiting, chest pain, shortness breath. Patient states she initially was going to be discharged a few days ago until she had worsening renal function prompting her discharge to be canceled. She has been evaluated by the wound care team during this hospitalization and has been undergoing wound care treatment per their recommendations. She states she's not had any physical therapy for upper extremity mobility for at least 2 years.  HPI  Past Medical History:  Diagnosis Date  . Anemia   . Anginal pain ([place])   . Breast cancer ([place])    2014   . Breast cancer metastasized to bone ([place]) 11/21/2014  . Hypertension   . Last menstrual period (LMP) > 10 days ago 2007  . Lymphedema of right upper extremity   . Myocardial infarction ([place])   . Post-menopausal 2006  . Sleep apnea   . Wound drainage     Past Surgical History:  Procedure Laterality Date  . CORONARY ANGIOPLASTY WITH STENT PLACEMENT N/A   . HERNIA REPAIR     umbilical hernia repair  . PORTACATH PLACEMENT Right 02/03/2015   Procedure:  INSERTION PORT-A-CATH;  Surgeon: [name], MD;  Location: [facility] ORS;  Service: General;  Laterality: Right;    Family History  Problem Relation Age of Onset  . Atrial fibrillation Sister   . Hypertension Sister     Social History Social History  Substance Use Topics  . Smoking status: Former Smoker    Packs/day: 1.00    Types: Cigarettes    Quit date: 10/25/2004  . Smokeless tobacco: Never Used     Comment: stopped smoking greater than 7 years ago  . Alcohol use No    Allergies  Allergen Reactions  . No Known Allergies     Current Facility-Administered Medications  Medication Dose Route Frequency Provider Last Rate Last Dose  . 0.9 %  sodium chloride infusion   Intravenous Continuous Demetrios Loll, MD 75 mL/hr at 11/02/16 1202    . acetaminophen (TYLENOL) tablet 1,000 mg  1,000 mg Oral Q6H PRN Harrie Foreman, MD      . amoxicillin-clavulanate (AUGMENTIN) 875-125 MG per tablet 1 tablet  1 tablet Oral Q12H Fritzi Mandes, MD   1 tablet at 11/02/16 [phone]  . apixaban (ELIQUIS) tablet 5 mg  5 mg Oral BID Harrie Foreman, MD   5 mg at 11/02/16 0834  . atorvastatin (LIPITOR) tablet 40 mg  40 mg Oral q1800 Harrie Foreman, MD   40 mg at 11/01/16 1717  . calcium-vitamin D (OSCAL WITH D) 500-200 MG-UNIT per tablet 1 tablet  1 tablet Oral Q breakfast Harrie Foreman, MD   1 tablet at 11/02/16 [phone]  . docusate sodium (COLACE) capsule 100 mg  100 mg Oral BID Harrie Foreman, MD   100 mg at 10/31/16 2155  . feeding supplement (ENSURE ENLIVE) (ENSURE ENLIVE) liquid 237 mL  237 mL Oral TID BM Fritzi Mandes, MD   237 mL at 11/02/16 0837  . heparin lock flush 100 unit/mL  500 Units Intravenous Once Fritzi Mandes, MD      . HYDROcodone-acetaminophen (NORCO/VICODIN) 5-325 MG per tablet 1 tablet  1 tablet Oral Q6H PRN Harrie Foreman, MD   1 tablet at 11/01/16 1457  . loperamide (IMODIUM) capsule 2 mg  2 mg Oral Q6H PRN Demetrios Loll, MD   2 mg at 11/01/16 1191  . loratadine (CLARITIN)  tablet 10 mg  10 mg Oral Daily Harrie Foreman, MD   10 mg at 11/02/16 0834  . morphine 2 MG/ML injection 2 mg  2 mg Intravenous Q4H PRN Harrie Foreman, MD   2 mg at 11/02/16 1201  . omega-3 acid ethyl esters (LOVAZA) capsule 1 g  1 g Oral QHS Harrie Foreman, MD   1 g at 11/01/16 2112  . ondansetron (ZOFRAN) tablet 4 mg  4 mg Oral Q6H PRN Harrie Foreman, MD       Or  . ondansetron [facility]) injection 4 mg  4 mg Intravenous Q6H PRN Harrie Foreman, MD      . phosphorus (K PHOS NEUTRAL) tablet 250 mg  250 mg Oral BID Harrie Foreman, MD   250 mg at 11/02/16 4782   Facility-Administered Medications Ordered in Other Encounters  Medication Dose Route Frequency Provider Last Rate Last Dose  . sodium chloride 0.9 % injection 10 mL  10 mL Intracatheter PRN Forest Gleason, MD   10 mL at 07/07/15 0911  . sodium chloride flush (NS) 0.9 % injection 10 mL  10 mL Intravenous PRN Choksi, Janak, MD      . sodium chloride flush (NS) 0.9 % injection 10 mL  10 mL Intravenous PRN Cammie Sickle, MD   10 mL at 01/20/16 1320     Review of Systems A Multi-point review of systems was asked and was negative except for the findings documented in the history of present illness  Physical Exam Blood pressure (!) 113/47, pulse 100, temperature 98.3 F (36.8 C), temperature source Oral, resp. rate 20, height 5\' 5"  (1.651 m), weight (!) 137.6 kg (303 lb 4.8 oz), SpO2 98 %. CONSTITUTIONAL: Resting in the chair in no acute distress. EYES: Pupils are equal, round, and reactive to light EARS, NOSE, MOUTH AND THROAT: The oropharynx is clear. Hearing is intact to voice. LYMPH NODES:  Lymph nodes in the neck are normal but difficult to palpate secondary to obesity. RESPIRATORY:  Lungs are clear.  CARDIOVASCULAR: Heart is tachycardic. GI: The abdomen is soft, nontender, and nondistended.  GU: Rectal deferred.   MUSCULOSKELETAL: Right upper extremity in a lymphedema wrap and unable to be moved by the  patient SKIN: Numerous areas of skin breakdown across the right shoulder, right upper extremity, right breast, right axilla. 3 separate wounds with healthy tissue and bleeding upon removal of the dressing. The right posterior shoulder wound is the largest and deepened this with fibrinous exudate down to the deepest aspect. Please refer to the wound care consult note for more accurate measurements. The only painful wound on examination his to her right biceps. There  is no evidence of spreading erythema or purulent drainage/discharge from any of the wounds. NEUROLOGIC:Cranial nerves are grossly intact. PSYCH:  Oriented to person, place and time. Affect is normal.3  Data Reviewed Images and labs reviewed. Upon most recent labs show an elevated creatinine of 2.75, and elevated BUN of 23 and a mild anemia with a hemoglobin of 7.4. There are no pertinent recent images pertaining to these wounds. I have personally reviewed the patient's imaging, laboratory findings and medical records.    Assessment    Multiple open wounds to the right shoulder and upper extremity secondary to cancer and radiation    Plan    61 year old female with unfortunate medical history. Her multiple wounds are secondary to her cancer, radiation therapy, lymphedema, sedentary lifestyle. Had long discussion with the patient and her family members that the wound that is the most tender is most consistent with skin breakdowns such as decubitus ulcer. This is due to the fact the patient will not move her arm and has not attempted to let someone do physical therapy. On a positive note all the wounds have been cared for very well by nursing staff following the recommendations of the wound care nurse. I would not change any of those recommendations upon this consultation. I would however recommend the addition of physical therapy to assist with at least passive movement of her right upper extremity. This could allow for symptomatic relief of  her tender ulcer and maximize the opportunity for it actually heal. Also discussed the possibility of hyperbaric oxygen therapy to assist with her wound bed management. Discussed we do not actively do that at this facility. This would require an outpatient referral to hyperbaric center in one of the surrounding cities. Discussed with the patient that at this point with no evidence of undrained abscesses or obvious purulence that is undrained from any of her current wounds that there is no indication for urgent surgical intervention. Discussed that the chronic nature of her wounds precluded them from being surgically closed as this would simply form an abscess and requires him to be reopened. This is a difficult situation for this patient as these areas will likely not heal secondary to her underlying disease.     Time spent with the patient was 80 minutes, with more than 50% of the time spent in face-to-face education, counseling and care coordination.     Amy Pert, MD FACS General Surgeon 11/02/2016, 12:13 PM

## 2016-11-03 DIAGNOSIS — C50919 Malignant neoplasm of unspecified site of unspecified female breast: Secondary | ICD-10-CM

## 2016-11-03 LAB — CBC WITH DIFFERENTIAL/PLATELET
BASOS PCT: 1 %
Basophils Absolute: 0 10*3/uL (ref 0–0.1)
Eosinophils Absolute: 0.1 10*3/uL (ref 0–0.7)
Eosinophils Relative: 3 %
HEMATOCRIT: 22.3 % — AB (ref 35.0–47.0)
HEMOGLOBIN: 7.1 g/dL — AB (ref 12.0–16.0)
LYMPHS ABS: 0.5 10*3/uL — AB (ref 1.0–3.6)
Lymphocytes Relative: 22 %
MCH: 27.8 pg (ref 26.0–34.0)
MCHC: 31.9 g/dL — ABNORMAL LOW (ref 32.0–36.0)
MCV: 87.1 fL (ref 80.0–100.0)
MONOS PCT: 6 %
Monocytes Absolute: 0.1 10*3/uL — ABNORMAL LOW (ref 0.2–0.9)
NEUTROS ABS: 1.5 10*3/uL (ref 1.4–6.5)
NEUTROS PCT: 68 %
Platelets: 108 10*3/uL — ABNORMAL LOW (ref 150–440)
RBC: 2.56 MIL/uL — ABNORMAL LOW (ref 3.80–5.20)
RDW: 21.3 % — ABNORMAL HIGH (ref 11.5–14.5)
WBC: 2.2 10*3/uL — ABNORMAL LOW (ref 3.6–11.0)

## 2016-11-03 LAB — BASIC METABOLIC PANEL
ANION GAP: 3 — AB (ref 5–15)
BUN: 23 mg/dL — ABNORMAL HIGH (ref 6–20)
CHLORIDE: 112 mmol/L — AB (ref 101–111)
CO2: 28 mmol/L (ref 22–32)
Calcium: 8.1 mg/dL — ABNORMAL LOW (ref 8.9–10.3)
Creatinine, Ser: 2.63 mg/dL — ABNORMAL HIGH (ref 0.44–1.00)
GFR calc non Af Amer: 19 mL/min — ABNORMAL LOW (ref >60)
GFR, EST AFRICAN AMERICAN: 22 mL/min — AB (ref >60)
Glucose, Bld: 126 mg/dL — ABNORMAL HIGH (ref 65–99)
Potassium: 3.9 mmol/L (ref 3.5–5.1)
Sodium: 143 mmol/L (ref 135–145)

## 2016-11-03 LAB — CULTURE, BLOOD (ROUTINE X 2)
Culture: NO GROWTH
Culture: NO GROWTH
Special Requests: ADEQUATE

## 2016-11-03 LAB — PREPARE RBC (CROSSMATCH)

## 2016-11-03 MED ORDER — HEPARIN SOD (PORK) LOCK FLUSH 100 UNIT/ML IV SOLN
500.0000 [IU] | Freq: Once | INTRAVENOUS | Status: AC
  Administered 2016-11-03: 500 [IU] via INTRAVENOUS

## 2016-11-03 MED ORDER — ACETAMINOPHEN 325 MG PO TABS
650.0000 mg | ORAL_TABLET | Freq: Once | ORAL | Status: AC
  Administered 2016-11-03: 650 mg via ORAL
  Filled 2016-11-03: qty 2

## 2016-11-03 MED ORDER — AMOXICILLIN-POT CLAVULANATE 875-125 MG PO TABS: 1.0000 | ORAL_TABLET | Freq: Two times a day (BID) | ORAL | 0 refills | Status: AC

## 2016-11-03 MED ORDER — SODIUM CHLORIDE 0.9 % IV SOLN
Freq: Once | INTRAVENOUS | Status: AC
  Administered 2016-11-03: 13:00:00 via INTRAVENOUS

## 2016-11-03 NOTE — Discharge Instructions (Signed)
Acute Kidney Injury, Adult Acute kidney injury is a sudden worsening of kidney function. The kidneys are organs that have several jobs. They filter the blood to remove waste products and extra fluid. They also maintain a healthy balance of minerals and hormones in the body, which helps control blood pressure and keep bones strong. With this condition, your kidneys do not do their jobs as well as they should. This condition ranges from mild to severe. Over time it may develop into long-lasting (chronic) kidney disease. Early detection and treatment may prevent acute kidney injury from developing into a chronic condition. What are the causes? Common causes of this condition include:  A problem with blood flow to the kidneys. This may be caused by:  Low blood pressure (hypotension) or shock.  Blood loss.  Heart and blood vessel (cardiovascular) disease.  Severe burns.  Liver disease.  Direct damage to the kidneys. This may be caused by:  Certain medicines.  A kidney infection.  Poisoning.  Being around or in contact with toxic substances.  A surgical wound.  A hard, direct hit to the kidney area.  A sudden blockage of urine flow. This may be caused by:  Cancer.  Kidney stones.  An enlarged prostate in males. What are the signs or symptoms? Symptoms of this condition may not be obvious until the condition becomes severe. Symptoms of this condition can include:  Tiredness (lethargy), or difficulty staying awake.  Nausea or vomiting.  Swelling (edema) of the face, legs, ankles, or feet.  Problems with urination, such as:  Abdominal pain, or pain along the side of your stomach (flank).  Decreased urine production.  Decrease in the force of urine flow.  Muscle twitches and cramps, especially in the legs.  Confusion or trouble concentrating.  Loss of appetite.  Fever. How is this diagnosed? This condition may be diagnosed with tests, including:  Blood  tests.  Urine tests.  Imaging tests.  A test in which a sample of tissue is removed from the kidneys to be examined under a microscope (kidney biopsy). How is this treated? Treatment for this condition depends on the cause and how severe the condition is. In mild cases, treatment may not be needed. The kidneys may heal on their own. In more severe cases, treatment will involve:  Treating the cause of the kidney injury. This may involve changing any medicines you are taking or adjusting your dosage.  Fluids. You may need specialized IV fluids to balance your body's needs.  Having a catheter placed to drain urine and prevent blockages.  Preventing problems from occurring. This may mean avoiding certain medicines or procedures that can cause further injury to the kidneys. In some cases treatment may also require:  A procedure to remove toxic wastes from the body (dialysis or continuous renal replacement therapy - CRRT).  Surgery. This may be done to repair a torn kidney, or to remove the blockage from the urinary system. Follow these instructions at home: Medicines   Take over-the-counter and prescription medicines only as told by your health care provider.  Do not take any new medicines without your health care provider's approval. Many medicines can worsen your kidney damage.  Do not take any vitamin and mineral supplements without your health care provider's approval. Many nutritional supplements can worsen your kidney damage. Lifestyle   If your health care provider prescribed changes to your diet, follow them. You may need to decrease the amount of protein you eat.  Achieve and maintain a  healthy weight. If you need help with this, ask your health care provider.  Start or continue an exercise plan. Try to exercise at least 30 minutes a day, 5 days a week.  Do not use any tobacco products, such as cigarettes, chewing tobacco, and e-cigarettes. If you need help quitting, ask  your health care provider. General instructions   Keep track of your blood pressure. Report changes in your blood pressure as told by your health care provider.  Stay up to date with immunizations. Ask your health care provider which immunizations you need.  Keep all follow-up visits as told by your health care provider. This is important. Where to find more information:  American Association of Kidney Patients: BombTimer.gl  National Kidney Foundation: www.kidney.[place]: https://mathis.com/  [facility]:  www.lifeoptions.org  www.kidneyschool.org Contact a health care provider if:  Your symptoms get worse.  You develop new symptoms. Get help right away if:  You develop symptoms of worsening kidney disease, which include:  Headaches.  Abnormally dark or light skin.  Easy bruising.  Frequent hiccups.  Chest pain.  Shortness of breath.  End of menstruation in women.  Seizures.  Confusion or altered mental status.  Abdominal or back pain.  Itchiness.  You have a fever.  Your body is producing less urine.  You have pain or bleeding when you urinate. Summary  Acute kidney injury is a sudden worsening of kidney function.  Acute kidney injury can be caused by problems with blood flow to the kidneys, direct damage to the kidneys, and sudden blockage of urine flow.  Symptoms of this condition may not be obvious until it becomes severe. Symptoms may include edema, lethargy, confusion, nausea or vomiting, and problems passing urine.  This condition can usually be diagnosed with blood tests, urine tests, and imaging tests. Sometimes a kidney biopsy is done to diagnose this condition.  Treatment for this condition often involves treating the underlying cause. It is treated with fluids, medicines, dialysis, diet changes, or surgery. This information is not intended to replace advice given to you by your health care provider.  Make sure you discuss any questions you have with your health care provider. Document Released: 12/27/2010 Document Revised: 06/03/2016 Document Reviewed: 06/03/2016 Elsevier Interactive Patient Education  2017 [place].   Antibiotic Medicine, Adult Antibiotic medicines are used to treat infections caused by bacteria, such as strep throat and urinary tract infection (UTI). Antibiotic medicines will not work for viral illnesses, such as colds or the flu (influenza). They work by killing the bacteria that is making you sick. Antibiotics can also have serious side effects. It is important that you take antibiotic medicines safely and only when needed. When do I need to take antibiotics? Antibiotics are medicines that treat bacterial infections. You may need antibiotics for:  UTI.  Strep throat.  Meningitis. This infection affects the spinal cord and brain.  Bacterial sinusitis.  Serious lung infection. You may start antibiotics while your health care provider waits for test results to come back. Common tests may include throat, urine, blood, or mucus culture. Your health care provider may change or stop the antibiotic depending on your test results. When are antibiotics not needed? You do not need antibiotics for most common illnesses. These illnesses may be caused by a virus, not a bacteria. You do not need antibiotics for:  The common cold.  Influenza.  Sore throat.  Discolored mucus.  Bronchitis. Antibiotics are not always needed for all bacterial  infections. Many of these infections clear up without antibiotic treatment. Do not ask for or take antibiotics when they are not necessary. How long should I take the antibiotic? You must take the entire prescription. Continue to take your antibiotic for as long as told by your health care provider. Do not stop taking it even if you start to feel better. If you stop taking it too soon:  You may start to feel sick again.  Your  infection may become harder to treat.  Complications may develop. Each course of antibiotics needs a different amount of time to work. Some antibiotic courses last only a few days. Some last about a week to 10 days. In some cases, you may need to take antibiotics for a few weeks to completely treat the infection. What if I miss a dose? Try not to miss any doses of medicine. If you miss a dose, call your health care provider or pharmacist for advice. Sometimes it is okay to take the missed dose as soon as possible. What are the risks of taking antibiotics? Most antibiotics can cause an infection called Clostridium difficile (C. difficile), which causes severe diarrhea. This infection happens when the antibiotics kill the healthy bacteria in your intestines. This allows C. difficile to grow. The infection needs to be treated right away. Let your health care provider know if:  You have diarrhea while taking an antibiotic.  You have diarrhea after you stop taking an antibiotic. C. difficile infection can start weeks after stopping the antibiotic. Taking an antibiotic also puts you at risk for getting a bacteria that does not respond to medicine (antibiotic-resistant infection) in the future. Antibiotics can cause bacteria to change so that if the antibiotic is taken again, the medicine is not able to kill the bacteria. These infections can be more serious and, in some cases, life-threatening. Do antibiotics affect birth control? Birth control pills may not work while you are on antibiotics. If you are taking birth control pills, continue taking them as usual and use a second form of birth control, such as a condom, to avoid unwanted pregnancy. Continue using the second form of birth control until your health care provider says you can stop. What else should I know about taking antibiotics? It is important for you to take antibiotics exactly as told. Make sure that you:  Take the entire course of  antibiotic that was prescribed. Do not stop taking your antibiotics even if your symptoms improve.  Take the correct amount of medicine each day.  Ask your health care provider:  How long to wait in between doses.  If the antibiotic should be taken with food.  If there are any foods, drinks, or medicines that you should avoid while taking the antibiotics.  If there are any side effects you should be aware of.  Only use the antibiotics prescribed for you by your health care provider. Do not use antibiotics prescribed for someone else.  Drink a large glass of water along with the antibiotics.  Ask the pharmacist for a syringe, cup, or spoon that properly measures the antibiotics.  Throw away any leftover medicine. Contact a health care provider if:  Your symptoms get worse.  You have new joint pain or muscle aches that begin after starting the antibiotic. When should I seek immediate medical care? Seek immediate medical care if:  You have signs of a serious allergic reaction to antibiotics. If you have signs of a severe allergic reaction, stop taking the antibiotic  right away. Signs may include:  Hives, which are raised, itchy, red bumps on the skin.  Skin rash.  Trouble breathing.  A wheezing sound when you breathe.  Swelling anywhere on your body.  Feeling dizzy.  Vomiting.  Your urine turns dark or becomes blood-colored.  Your skin turns yellow.  You bruise or bleed easily.  You have severe diarrhea and abdominal cramps.  You have a severe headache. Summary  Antibiotic medicines are used to treat infections caused by bacteria, such as strep throat and UTIs. It is important that you take antibiotic medicines only when needed.  Your health care provider may change or stop the antibiotic depending on your test results.  Most antibiotics can cause an infection called Clostridium difficile (C. difficile), which causes severe diarrhea. Let your health care  provider know if you develop diarrhea while taking an antibiotic.  Take the entire course of antibiotic that was prescribed. This information is not intended to replace advice given to you by your health care provider. Make sure you discuss any questions you have with your health care provider. Document Released: 02/24/2004 Document Revised: 06/14/2016 Document Reviewed: 06/14/2016 Elsevier Interactive Patient Education  2017 [place].  Blood Urea Nitrogen Test Why am I having this test? The blood urea nitrogen (BUN) test measures the amount of urea nitrogen in your blood. Urea nitrogen is made in the liver. It is a product of digestion and the breakdown of protein. Most diseases or conditions that affect the kidneys or liver can affect the amount of urea in your blood. The BUN test is primarily used to:  Evaluate kidney function.  Evaluate liver function.  Monitor people with kidney dysfunction or failure. The BUN test is often done during routine lab testing. It is usually interpreted along with a test called a creatinine test. The BUN/creatinine ratio is a good indicator of kidney and liver function. What kind of sample is taken? A blood sample is required for this test. It is usually collected by inserting a needle into a vein. How do I prepare for this test? There is no preparation required for this test. What are the reference ranges? Reference ranges are considered healthy ranges established after testing a large group of healthy people. Reference ranges may vary among different people, labs, and hospitals. It is your responsibility to obtain your test results. Ask the lab or department performing the test when and how you will get your results.  Adult: 10-20 mg/dL or 3.6-7.1 mmol/L (SI units).  Elderly: values may be slightly higher than adult values.  Child: 5-18 mg/dL.  Infant: 5-18 mg/dL.  Newborn: 3-12 mg/dL.  Cord: 21-40 mg/dL. What do the results  mean? Abnormally high BUN levels may be caused by:  Kidney disease.  Obstruction of the flow of urine.  Overconsumption of protein.  Excessive protein breakdown.  Decrease in blood volume (hypovolemia).  Dehydration.  Advanced pregnancy.  Gastrointestinal bleeding.  Congestive heart failure.  Acute heart attack (myocardial infarction. MI).  Shock.  Sepsis.  Burns.  Certain drugs. Abnormally low BUN levels may be caused by:  Liver failure.  Excessive hydration.  Early pregnancy.  Malnutrition.  Low-protein, high-carbohydrate diets.  Certain drugs. Talk with your health care provider to discuss your results, treatment options, and if necessary, the need for more tests. Talk with your health care provider if you have any questions about your results. Talk with your health care provider to discuss your results, treatment options, and if necessary, the need for more  tests. Talk with your health care provider if you have any questions about your results. This information is not intended to replace advice given to you by your health care provider. Make sure you discuss any questions you have with your health care provider. Document Released: 07/06/2004 Document Revised: 02/15/2016 Document Reviewed: 10/29/2013 Elsevier Interactive Patient Education  2017 [place]. F/u Dr Patrecia Pace Wound care. Dressing changes per wound care RN instructions at home by [facility]. [facility]PTOT.  Outpatient palliative for continued GOC as cancer and wounds progress

## 2016-11-03 NOTE — Progress Notes (Signed)
[name]   DOB:05/06/1956   PF#:[id number]    Subjective: Metastatic breast cancer.  No nausea no vomiting or diarrhea. No fevers. Appetite is improving. She is ambulating. Significant improvement in the discharge of the shoulder wounds.   Objective:  Vitals:   11/02/16 1944 11/03/16 0358  BP: (!) 113/51 130/67  Pulse: 98 (!) 101  Resp: 20 20  Temp: 98 F (36.7 C) 98.1 F (36.7 C)     Intake/Output Summary (Last 24 hours) at 11/03/16 1227 Last data filed at 11/03/16 3817  Gross per 24 hour  Intake             1373 ml  Output              300 ml  Net             1073 ml    GENERAL: Well-nourished well-developed; Alert, no distress and comfortable.  She is Accompanied by her husband. She Is sitting in a chair.  Morbidly obese. EYES: no pallor or icterus OROPHARYNX: no thrush or ulceration;  NECK: supple, no masses felt LYMPH:  no palpable lymphadenopathy in the cervical, axillary or inguinal regions LUNGS: clear to auscultation and  No wheeze or crackles HEART/CVS: regular rate & rhythm and no murmurs; chronic bilateral lower extremity edema. ABDOMEN:abdomen soft, non-tender and normal bowel sounds Musculoskeletal:no cyanosis of digits and no clubbing; right upper extremity lymphedema noted. PSYCH: alert & oriented x 3 with fluent speech NEURO: no focal motor/sensory deficits SKIN:   Right breast ulcerated/ scabbing lesion noted; no drainage. Right anterior [4cm] shoulder; shoulder lesions [ 4x4 cm in size] noted. Bandaged. No bleeding noted.    Labs:  Lab Results  Component Value Date   WBC 2.2 (L) 11/03/2016   HGB 7.1 (L) 11/03/2016   HCT 22.3 (L) 11/03/2016   MCV 87.1 11/03/2016   PLT 108 (L) 11/03/2016   NEUTROABS 1.5 11/03/2016    Lab Results  Component Value Date   NA 143 11/03/2016   K 3.9 11/03/2016   CL 112 (H) 11/03/2016   CO2 28 11/03/2016    Studies:  No results found.  Assessment & Plan:   61 year old female patient with metastatic  breast cancer ER/PR positive HER-2/neu negative currently admitted to hospital for worsening discharge/bleeding from right chest /shoulder Malignant lesion- hospital course complicated by acute renal failure.   # Metastatic breast cancer ER/PR positive HER-2/neu negative- status post multiple lines of therapy currently on Abemacliclib + Faslodex. Given the acute renal failure/ drop in blood counts- recommend holding Abemaciclib. Discontinued for now. Discussed with the patient. We will plan to get imaging/scans once acute renal failure is improved.  # Acute renal failure- base and creatinine 0.8 currently 2.6; appreciate nephrology evaluation/recommendations. Question trending down.  # Pancytopenia- white count 2.2 hemoglobin 7.1 [status post 100 of PRBC transfusion] platelets 108. Marland Kitchen Recommend continuing holding Abemaciclib. Proceed with 1 unit of transfusion  # History of PE on Eliquis 5 twice a day- high risk for recurrent PE given active malignancy obesity.   # Malignant right shoulder wound- infection/sepsis improved post antibiotics. Continue oral Augmentin. Reviewed/appreciate surgery recommendations; wound care and also hyperbaric oxygen as outpatient.   # Patient follow-up with me on May 14 8:30 appointment/labs. Discussed with the patient she agrees. Discussed with Dr. Jamesetta Geralds, MD 11/03/2016  12:27 PM

## 2016-11-03 NOTE — Progress Notes (Signed)
Discharge instructions given and went over with patient and husband at bedside. Prescriptions reviewed. All questions answered.   Home medication returned from pharmacy.   Patient discharged home with husband via wheelchair by nursing staff. Madlyn Frankel, RN

## 2016-11-03 NOTE — Discharge Summary (Signed)
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 Anemia   . Anginal pain ([place])   . Breast cancer ([place])    2014   . Breast cancer metastasized to bone ([place]) 11/21/2014  . Hypertension   . Last menstrual period (LMP) > 10 days ago 2007  . Lymphedema of right upper extremity   . Myocardial infarction ([place])   . Post-menopausal 2006  . Sleep apnea   . Wound drainage    HOSPITAL COURSE:   61 year old female admitted for sepsis.  1. Sepsis: presented on admisison - patient meets criteria via tachycardia, tachypnea and leukopenia.  -Source is certainly her infected wounds which are deep and draining purulent material. She states that the drainage is worse and distinctly different than sloughing of skin secondary to radiation.  -ON IV Vancomycin and Zosyn ---changedto oral augmentin -The wounds extend into her axilla and chest wall -Wound care consult appreciated. Follow wound dressings and HHRN to help with it after d/c. No indication for surgery but recommend the addition of physical therapy to assist with at least passive movement of her right upper extremity per Dr. Adonis Huguenin.  2. Acute kidney injury: likely ATN from multiple factors including taking ACE inhibitors in the setting of volume depletion prior to  admission.  -received IVF appears euvolemic -baseline creat 0.9 -came in with 1.23---1.5--2.42-2.57-2.75-2/63/ -hold lasix and lisinopril per Dr Candiss Norse. Treated with intravenous fluid. Avoid nephrotoxic agents, f/u BMP with Dr. Candiss Norse. Good urine output.  3. Hypokalemia: Replete potassium. K 4.1  4. 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 The former lesions are stable in the latter is mildly reduced in size. Recommend continuing holding Abemaciclib per oncologist.  5. Hypertension: with relative hypotension -hold BP meds  6. CHF: hold Lasix. Pt is not in HF  7. DVT prophylaxis: Eliquis for pulmonary embolism found incidentally on staging CT scan  8.Pancytopenia with h/o anemia  -came in with Hgb 8.8---7.1, s/p transfusion 1 unit pack cell, Hb 7.1 today. -no active bleeding from ulcer site. 1 unit PRBC transfusion today per oncologist. I discussed with Cammie Sickle, MD. DISCHARGE CONDITIONS:  Stable, discharge to home with home health, PT and OT today. CONSULTS OBTAINED:  Treatment Team:  Lequita Asal, MD Clayburn Pert, MD DRUG ALLERGIES:   Allergies  Allergen Reactions  . No Known Allergies    DISCHARGE MEDICATIONS:   Allergies as of 11/03/2016      Reactions   No Known Allergies       Medication List    STOP taking these medications   Abemaciclib 150 MG Tabs   furosemide 20 MG tablet Commonly known as:  LASIX   lisinopril 20 MG tablet Commonly known as:  PRINIVIL,ZESTRIL   metoprolol succinate 50 MG 24 hr tablet Commonly known as:  TOPROL-XL  potassium chloride 10 MEQ tablet Commonly known as:  K-DUR     TAKE these medications   acetaminophen 500 MG tablet Commonly known as:  TYLENOL Take 1,000 mg by mouth every 6 (six) hours as needed.   amoxicillin-clavulanate 875-125 MG tablet Commonly known as:  AUGMENTIN Take 1 tablet by mouth every 12 (twelve) hours.   [facility] CALCIUM 600+D 600-800 MG-UNIT  Tabs Generic drug:  Calcium Carb-Cholecalciferol Take 1 tablet by mouth daily. At noon   ELIQUIS 5 MG Tabs tablet Generic drug:  apixaban TAKE 1 TABLET BY MOUTH TWICE DAILY   feeding supplement (ENSURE ENLIVE) Liqd Take 237 mLs by mouth 3 (three) times daily between meals.   fexofenadine 180 MG tablet Commonly known as:  ALLEGRA Take 180 mg by mouth daily.   gabapentin 300 MG capsule Commonly known as:  NEURONTIN TAKE 1 CAPSULE BY MOUTH ONCE DAILY IN THE MORNING AND TAKE 2 CAPSULES BY MOUTH IN THE EVENING   HYDROcodone-acetaminophen 5-325 MG tablet Commonly known as:  NORCO Take 1 tablet by mouth every 6 (six) hours as needed for moderate pain (every 6 to 8 hrs prn pain).   omega-3 acid ethyl esters 1 g capsule Commonly known as:  LOVAZA Take 1 g by mouth at bedtime.   ondansetron 8 MG tablet Commonly known as:  ZOFRAN TAKE 1 TABLET (8MG ) BY MOUTH TWICE DAILY START THE DAY AFTER CHEMOTHERAPY FOR 2 DAYS THEN TAKE AS NEEDED FOR NAUSEA OR VOMITING   phosphorus 155-852-130 MG tablet Commonly known as:  PHOSPHA 250 NEUTRAL Take 1 tablet (250 mg total) by mouth 2 (two) times daily.   simvastatin 80 MG tablet Commonly known as:  ZOCOR Take 80 mg by mouth at bedtime.        DISCHARGE INSTRUCTIONS:  See AVS.  If you experience worsening of your admission symptoms, develop shortness of breath, life threatening emergency, suicidal or homicidal thoughts you must seek medical attention immediately by calling 911 or calling your MD immediately  if symptoms less severe.  You Must read complete instructions/literature along with all the possible adverse reactions/side effects for all the Medicines you take and that have been prescribed to you. Take any new Medicines after you have completely understood and accpet all the possible adverse reactions/side effects.   Please note  You were cared for by a hospitalist during your hospital stay. If you have any questions about your  discharge medications or the care you received while you were in the hospital after you are discharged, you can call the unit and asked to speak with the hospitalist on call if the hospitalist that took care of you is not available. Once you are discharged, your primary care physician will handle any further medical issues. Please note that NO REFILLS for any discharge medications will be authorized once you are discharged, as it is imperative that you return to your primary care physician (or establish a relationship with a primary care physician if you do not have one) for your aftercare needs so that they can reassess your need for medications and monitor your lab values.    On the day of Discharge:  VITAL SIGNS:  Blood pressure 115/61, pulse 92, temperature 98.5 F (36.9 C), temperature source Oral, resp. rate 18, height 5\' 5"  (1.651 m), weight (!) 305 lb 11.2 oz (138.7 kg), SpO2 99 %. PHYSICAL EXAMINATION:  GENERAL:  61 y.o.-year-old patient lying in the bed with no acute distress.  EYES: Pupils equal, round, reactive to light and  accommodation. No scleral icterus. Extraocular muscles intact.  HEENT: Head atraumatic, normocephalic. Oropharynx and nasopharynx clear.  NECK:  Supple, no jugular venous distention. No thyroid enlargement, no tenderness.  LUNGS: Normal breath sounds bilaterally, no wheezing, rales,rhonchi or crepitation. No use of accessory muscles of respiration.  CARDIOVASCULAR: S1, S2 normal. No murmurs, rubs, or gallops.  ABDOMEN: Soft, non-tender, non-distended. Bowel sounds present. No organomegaly or mass.  EXTREMITIES: No pedal edema, cyanosis, or clubbing. Right shoulder and chest in dressing. NEUROLOGIC: Cranial nerves II through XII are intact. Muscle strength 5/5 in all extremities. Sensation intact. Gait not checked.  PSYCHIATRIC: The patient is alert and oriented x 3.  SKIN: No obvious rash, lesion, or ulcer.  DATA REVIEW:   CBC  Recent Labs Lab 11/03/16 0620   WBC 2.2*  HGB 7.1*  HCT 22.3*  PLT 108*    Chemistries   Recent Labs Lab 10/29/16 0140  11/03/16 0620  NA 137  < > 143  K 2.7*  < > 3.9  CL 99*  < > 112*  CO2 27  < > 28  GLUCOSE 144*  < > 126*  BUN 15  < > 23*  CREATININE 1.23*  < > 2.63*  CALCIUM 8.6*  < > 8.1*  AST 15  --   --   ALT 7*  --   --   ALKPHOS 42  --   --   BILITOT 0.7  --   --   < > = values in this interval not displayed.   Microbiology Results  Results for orders placed or performed during the hospital encounter of 10/29/16  Blood culture (routine x 2)     Status: None   Collection Time: 10/29/16  1:40 AM  Result Value Ref Range Status   Specimen Description BLOOD LEFT ANTECUBITAL  Final   Special Requests   Final    BOTTLES DRAWN AEROBIC AND ANAEROBIC Blood Culture results may not be optimal due to an excessive volume of blood received in culture bottles   Culture NO GROWTH 5 DAYS  Final   Report Status 11/03/2016 FINAL  Final  Blood culture (routine x 2)     Status: None   Collection Time: 10/29/16  2:40 AM  Result Value Ref Range Status   Specimen Description BLOOD LEFT HAND  Final   Special Requests   Final    BOTTLES DRAWN AEROBIC AND ANAEROBIC Blood Culture adequate volume   Culture NO GROWTH 5 DAYS  Final   Report Status 11/03/2016 FINAL  Final    RADIOLOGY:  No results found.   Management plans discussed with the patient, her husband and they are in agreement.  CODE STATUS: Full Code   TOTAL TIME TAKING CARE OF THIS PATIENT: 35 minutes.    Demetrios Loll M.D on 11/03/2016 at 2:11 PM  Between 7am to 6pm - Pager - [phone]  After 6pm go to [email or web address] - [employer] [facility]  Office  [phone]  CC: Primary care physician; Kirk Ruths, MD   Note: This dictation was prepared with Dragon dictation along with smaller phrase technology. Any transcriptional errors that result from this process are unintentional.

## 2016-11-03 NOTE — Progress Notes (Signed)
[facility], [place] 11/03/16  Subjective:  Patient is doing fair No acute complaints Denies shortness of breath Serum creatinine slightly improved today   Objective:  Vital signs in last 24 hours:  Temp:  [98 F (36.7 C)-98.2 F (36.8 C)] 98.1 F (36.7 C) (05/10 0358) Pulse Rate:  [88-101] 101 (05/10 0358) Resp:  [20] 20 (05/10 0358) BP: (113-130)/(51-67) 130/67 (05/10 0358) SpO2:  [98 %-99 %] 99 % (05/10 0358) Weight:  [138.7 kg (305 lb 11.2 oz)] 138.7 kg (305 lb 11.2 oz) (05/10 0500)  Weight change: 1.089 kg (2 lb 6.4 oz) Filed Weights   11/01/16 0500 11/02/16 0500 11/03/16 0500  Weight: (!) 136.9 kg (301 lb 11.2 oz) (!) 137.6 kg (303 lb 4.8 oz) (!) 138.7 kg (305 lb 11.2 oz)    Intake/Output:    Intake/Output Summary (Last 24 hours) at 11/03/16 1222 Last data filed at 11/03/16 5397  Gross per 24 hour  Intake             1373 ml  Output              300 ml  Net             1073 ml     Physical Exam: General: Morbidly obese lady,    HEENT Moist oral mucous membranes  Neck supple  Pulm/lungs Normal breathing effort, limited exam,   CVS/Heart regular  Abdomen:  Obese, soft, nontender  Extremities: + pitting edema over lower legs  Neurologic: Alert, oriented  Skin: wounds of the right shoulder and arm          Basic Metabolic Panel:   Recent Labs Lab 10/30/16 1002 10/31/16 0832 11/01/16 0432 11/02/16 0602 11/03/16 0620  NA 138 140 144 144 143  K 4.0 4.0 3.8 4.1 3.9  CL 105 104 111 108 112*  CO2 27 29 28 28 28   GLUCOSE 158* 123* 123* 128* 126*  BUN 14 21* 24* 23* 23*  CREATININE 1.51* 2.42* 2.57* 2.75* 2.63*  CALCIUM 8.0* 8.7* 7.7* 8.3* 8.1*  PHOS  --  3.8  --   --   --      CBC:  Recent Labs Lab 10/29/16 0140 10/31/16 0832 11/01/16 0432 11/02/16 0602 11/03/16 0620  WBC 3.0* 2.8*  --   --  2.2*  NEUTROABS 2.3 1.9  --   --  1.5  HGB 8.8* 7.1* 7.2* 7.4* 7.1*  HCT 28.3* 22.8*  --   --  22.3*  MCV 85.3 86.5   --   --  87.1  PLT 110* 110*  --   --  108*     No results found for: HEPBSAG, HEPBSAB, HEPBIGM    Microbiology:  Recent Results (from the past 240 hour(s))  Blood culture (routine x 2)     Status: None   Collection Time: 10/29/16  1:40 AM  Result Value Ref Range Status   Specimen Description BLOOD LEFT ANTECUBITAL  Final   Special Requests   Final    BOTTLES DRAWN AEROBIC AND ANAEROBIC Blood Culture results may not be optimal due to an excessive volume of blood received in culture bottles   Culture NO GROWTH 5 DAYS  Final   Report Status 11/03/2016 FINAL  Final  Blood culture (routine x 2)     Status: None   Collection Time: 10/29/16  2:40 AM  Result Value Ref Range Status   Specimen Description BLOOD LEFT HAND  Final   Special Requests  Final    BOTTLES DRAWN AEROBIC AND ANAEROBIC Blood Culture adequate volume   Culture NO GROWTH 5 DAYS  Final   Report Status 11/03/2016 FINAL  Final    Coagulation Studies: No results for input(s): LABPROT, INR in the last 72 hours.  Urinalysis:  Recent Labs  10/31/16 1931  COLORURINE YELLOW*  LABSPEC 1.006  PHURINE 7.0  GLUCOSEU NEGATIVE  HGBUR SMALL*  BILIRUBINUR NEGATIVE  KETONESUR NEGATIVE  PROTEINUR NEGATIVE  NITRITE NEGATIVE  LEUKOCYTESUR NEGATIVE      Imaging: No results found.   Medications:   . sodium chloride     . acetaminophen  650 mg Oral Once  . amoxicillin-clavulanate  1 tablet Oral Q12H  . apixaban  5 mg Oral BID  . atorvastatin  40 mg Oral q1800  . calcium-vitamin D  1 tablet Oral Q breakfast  . docusate sodium  100 mg Oral BID  . feeding supplement (ENSURE ENLIVE)  237 mL Oral TID BM  . heparin lock flush  500 Units Intravenous Once  . loratadine  10 mg Oral Daily  . omega-3 acid ethyl esters  1 g Oral QHS   acetaminophen, HYDROcodone-acetaminophen, loperamide, morphine injection, ondansetron **OR** ondansetron (ZOFRAN) IV  Assessment/ Plan:  61 y.o.caucasian  female with metastatic breast  cancer treated with chemotherapy and radiation, lymphedema right arm with wounds,morbid obesity, history of pulmonary embolism requiring chronic anticoagulation , was admitted on 10/29/2016.   1.  Acute renal failure Patient's baseline creatinine is 0.68 from April 2018  Slight improvement in S Creatinine Patient states she might be d/c today. She is to f/u with Dr Rogue Bussing at the cancer center on Monday when labs will be checked She will f/u with [place] as needed Office phone number and card provided   Electrolytes and volume status are acceptable.  No acute indication for dialysis Cause of acute renal failure is likely ATN from multiple factors including taking ACE inhibitors in the setting of volume depletion prior to admission.  It is possible that acute kidney injury was worsened with iv vancomycin  plan - May restart ACE-I if creatinine continues to improve - Maintain good volume status; supportive care - Avoid nephrotoxic agents including ACE- inhibitors, IV contrast    LOS: 5 [facility] 5/10/201812:22 PM  [place], [place]

## 2016-11-03 NOTE — Progress Notes (Signed)
Daily Progress Note   Patient Name: [name]       Date: 11/03/2016 DOB: Nov 20, 1955  Age: 61 y.o. MRN#: [id number] Attending Physician: Amy Loll, MD Primary Care Physician: Amy Ruths, MD Admit Date: 10/29/2016  Reason for Consultation/Follow-up: Establishing goals of care  Subjective: Patient in bed, receiving blood transfusion. She feels weak, but is looking forward to discharge.  Chemotherapy has been put on hold d/t renal failure and low counts. Surgical consult recommended wound care and PT in hopes of regaining arm movement. Patient very optimistic about returning home and regaining some use of her arm.  She is planning for discharge today. Did not discuss further healthcare planning or code status. She agreed to outpatient follow up by Palliative medicine for continued GOC.   Review of Systems  Gastrointestinal: Positive for diarrhea.  All other systems reviewed and are negative.   Length of Stay: 5  Current Medications: Scheduled Meds:  . amoxicillin-clavulanate  1 tablet Oral Q12H  . apixaban  5 mg Oral BID  . atorvastatin  40 mg Oral q1800  . calcium-vitamin D  1 tablet Oral Q breakfast  . docusate sodium  100 mg Oral BID  . feeding supplement (ENSURE ENLIVE)  237 mL Oral TID BM  . heparin lock flush  500 Units Intravenous Once  . loratadine  10 mg Oral Daily  . omega-3 acid ethyl esters  1 g Oral QHS    Continuous Infusions:   PRN Meds: acetaminophen, HYDROcodone-acetaminophen, loperamide, morphine injection, ondansetron **OR** ondansetron (ZOFRAN) IV  Physical Exam  Constitutional: She is oriented to person, place, and time.  Cardiovascular: Normal rate.   Pulmonary/Chest: Effort normal.  Musculoskeletal:  R arm w/ edema, lymphedema sleeve in  place, she is unable to move it R shoulder with dressed wounds  Neurological: She is alert and oriented to person, place, and time.  Skin: There is pallor.  Psychiatric: She has a normal mood and affect. Her behavior is normal. Judgment and thought content normal.  Nursing note and vitals reviewed.           Vital Signs: BP 115/61   Pulse 92   Temp 98.5 F (36.9 C) (Oral)   Resp 20   Ht 5' 5"  (1.651 m)   Wt (!) 138.7 kg (305 lb 11.2 oz)  SpO2 99%   BMI 50.87 kg/m  SpO2: SpO2: 99 % O2 Device: O2 Device: Not Delivered O2 Flow Rate:    Intake/output summary:   Intake/Output Summary (Last 24 hours) at 11/03/16 1346 Last data filed at 11/03/16 1315  Gross per 24 hour  Intake             1373 ml  Output              300 ml  Net             1073 ml   LBM: Last BM Date: 11/02/16 Baseline Weight: Weight: (!) 138.3 kg (305 lb) Most recent weight: Weight: (!) 138.7 kg (305 lb 11.2 oz)       Palliative Assessment/Data: PPS: 60%   Flowsheet Rows     Most Recent Value  Intake Tab  Referral Department  Hospitalist  Unit at Time of Referral  Oncology Unit  Palliative Care Primary Diagnosis  Cancer  Date Notified  10/29/16  Palliative Care Type  New Palliative care  Reason for referral  Clarify Goals of Care  Date of Admission  10/29/16  Date first seen by Palliative Care  11/01/16  # of days Palliative referral response time  3 Day(s)  # of days IP prior to Palliative referral  0  Clinical Assessment  Psychosocial & Spiritual Assessment  Palliative Care Outcomes      Patient Active Problem List   Diagnosis Date Noted  . 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 Advance care planning   . Palliative care by specialist   . Sepsis ([place]) 10/29/2016  . Counseling regarding goals of care 10/03/2016  . Portacath in place 09/12/2016  . Carcinoma of overlapping sites of right breast in female, estrogen receptor positive ([place]) 04/11/2016  . Pulmonary emboli ([place])  02/08/2016  . Metastasis to bone ([place]) 12/09/2015  . 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 Lymphedema of upper extremity 12/09/2015  . Last menstrual period (LMP) > 10 days ago   . Post-menopausal   . CAD in native artery 01/28/2015  . Diabetes mellitus, type 2 ([place]) 01/28/2015  . Obstructive apnea 01/28/2015  . Type 2 diabetes mellitus ([place]) 01/28/2015  . Breast cancer metastasized to bone [facility]) 11/21/2014    Palliative Care Assessment & Plan   Patient Profile: 61 y.o. female  with past medical history of HTN, anemia, MI, sleep apnea, Stage IV breast cancer (met to spine, chest wall, adrenal gland, fungating R shoulder/chest wall mass) with admitted on 10/29/2016 with infected ulcer to R shoulder and sepsis- there was concern for gangrenous limb without possibility of amputation due to involvement of chest wall. Now with acute kidney injury. Palliative medicine consulted for [place].   Assessment/Recommendations/Plan   Outpatient palliative for continued GOC as cancer and wounds progress  Patient will need palliative follow up especially following her appointment with Oncology as chemotherapy is currently on hold due to renal failure and low counts, scans are planned to evaluate for progression.   Patient has had difficulty engaging in conversation regarding advance healthcare planning. She continues to state that she wants to be "hopeful for her family". She is, however, open to continuing discussion regarding her [place] and will benefit from ongoing realistic discussions from her providers regarding her prognosis   Goals of Care and Additional Recommendations:  Limitations on Scope of Treatment: Full Scope Treatment  Code Status:  Full code  Prognosis:   Unable to determine  Discharge Planning:  Home with Palliative Services  Care plan was discussed with patient.   Thank you for allowing the Palliative Medicine Team to assist in the care of this  patient.   Time In: 1330 Time Out: 1358 Total Time 28 mins Prolonged Time Billed no      Greater than 50%  of this time was spent counseling and coordinating care related to the above assessment and plan.  [name], AGNP-C Palliative Medicine   Please contact Palliative Medicine Team phone at [phone] for questions and concerns.

## 2016-11-04 ENCOUNTER — Inpatient Hospital Stay: Payer: BLUE CROSS/BLUE SHIELD

## 2016-11-04 ENCOUNTER — Inpatient Hospital Stay: Payer: BLUE CROSS/BLUE SHIELD | Admitting: Internal Medicine

## 2016-11-04 ENCOUNTER — Other Ambulatory Visit: Payer: Self-pay | Admitting: *Deleted

## 2016-11-04 ENCOUNTER — Telehealth: Payer: Self-pay | Admitting: *Deleted

## 2016-11-04 LAB — BPAM RBC
Blood Product Expiration Date: 201805152359
ISSUE DATE / TIME: 201805101316
Unit Type and Rh: 600

## 2016-11-04 LAB — TYPE AND SCREEN
ABO/RH(D): A POS
Antibody Screen: NEGATIVE
Unit division: 0

## 2016-11-04 MED ORDER — HYDROCODONE-ACETAMINOPHEN 5-325 MG PO TABS: 1.0000 | ORAL_TABLET | Freq: Four times a day (QID) | ORAL | 0 refills | Status: AC | PRN

## 2016-11-04 NOTE — Telephone Encounter (Signed)
Patient requested refill for Norco. Informed patient that prescription would be at the front desk.

## 2016-11-07 ENCOUNTER — Inpatient Hospital Stay: Payer: BC Managed Care – PPO | Attending: Internal Medicine

## 2016-11-07 ENCOUNTER — Ambulatory Visit
Admission: RE | Admit: 2016-11-07 | Discharge: 2016-11-07 | Disposition: A | Discharge status: Home / Self Care | Payer: BC Managed Care – PPO | Source: Ambulatory Visit | Attending: Radiation Oncology | Admitting: Radiation Oncology

## 2016-11-07 ENCOUNTER — Inpatient Hospital Stay (HOSPITAL_BASED_OUTPATIENT_CLINIC_OR_DEPARTMENT_OTHER): Payer: BC Managed Care – PPO | Admitting: Internal Medicine

## 2016-11-07 ENCOUNTER — Inpatient Hospital Stay: Payer: BC Managed Care – PPO

## 2016-11-07 VITALS — BP 128/80 | HR 86 | Temp 98.0°F | Resp 20 | Ht 65.0 in | Wt 305.7 lb

## 2016-11-07 DIAGNOSIS — G4730 Sleep apnea, unspecified: Secondary | ICD-10-CM

## 2016-11-07 DIAGNOSIS — Z170 Estrogen receptor positive status [ER+]: Secondary | ICD-10-CM | POA: Insufficient documentation

## 2016-11-07 DIAGNOSIS — C50911 Malignant neoplasm of unspecified site of right female breast: Secondary | ICD-10-CM

## 2016-11-07 DIAGNOSIS — I252 Old myocardial infarction: Secondary | ICD-10-CM

## 2016-11-07 DIAGNOSIS — C7972 Secondary malignant neoplasm of left adrenal gland: Secondary | ICD-10-CM | POA: Diagnosis not present

## 2016-11-07 DIAGNOSIS — Z87891 Personal history of nicotine dependence: Secondary | ICD-10-CM

## 2016-11-07 DIAGNOSIS — Z79899 Other long term (current) drug therapy: Secondary | ICD-10-CM | POA: Insufficient documentation

## 2016-11-07 DIAGNOSIS — I209 Angina pectoris, unspecified: Secondary | ICD-10-CM

## 2016-11-07 DIAGNOSIS — Z79818 Long term (current) use of other agents affecting estrogen receptors and estrogen levels: Secondary | ICD-10-CM | POA: Insufficient documentation

## 2016-11-07 DIAGNOSIS — D649 Anemia, unspecified: Secondary | ICD-10-CM | POA: Insufficient documentation

## 2016-11-07 DIAGNOSIS — G893 Neoplasm related pain (acute) (chronic): Secondary | ICD-10-CM

## 2016-11-07 DIAGNOSIS — I2510 Atherosclerotic heart disease of native coronary artery without angina pectoris: Secondary | ICD-10-CM

## 2016-11-07 DIAGNOSIS — E876 Hypokalemia: Secondary | ICD-10-CM | POA: Insufficient documentation

## 2016-11-07 DIAGNOSIS — I10 Essential (primary) hypertension: Secondary | ICD-10-CM

## 2016-11-07 DIAGNOSIS — Z923 Personal history of irradiation: Secondary | ICD-10-CM | POA: Diagnosis not present

## 2016-11-07 DIAGNOSIS — C50811 Malignant neoplasm of overlapping sites of right female breast: Secondary | ICD-10-CM

## 2016-11-07 DIAGNOSIS — N19 Unspecified kidney failure: Secondary | ICD-10-CM | POA: Insufficient documentation

## 2016-11-07 DIAGNOSIS — I890 Lymphedema, not elsewhere classified: Secondary | ICD-10-CM | POA: Diagnosis not present

## 2016-11-07 DIAGNOSIS — L0889 Other specified local infections of the skin and subcutaneous tissue: Secondary | ICD-10-CM | POA: Diagnosis not present

## 2016-11-07 DIAGNOSIS — C7951 Secondary malignant neoplasm of bone: Secondary | ICD-10-CM

## 2016-11-07 DIAGNOSIS — E669 Obesity, unspecified: Secondary | ICD-10-CM | POA: Insufficient documentation

## 2016-11-07 DIAGNOSIS — Z86711 Personal history of pulmonary embolism: Secondary | ICD-10-CM | POA: Diagnosis not present

## 2016-11-07 DIAGNOSIS — Z95828 Presence of other vascular implants and grafts: Secondary | ICD-10-CM

## 2016-11-07 DIAGNOSIS — N179 Acute kidney failure, unspecified: Secondary | ICD-10-CM | POA: Insufficient documentation

## 2016-11-07 DIAGNOSIS — I517 Cardiomegaly: Secondary | ICD-10-CM | POA: Insufficient documentation

## 2016-11-07 LAB — COMPREHENSIVE METABOLIC PANEL
ALK PHOS: 56 U/L (ref 38–126)
ALT: 9 U/L — AB (ref 14–54)
AST: 16 U/L (ref 15–41)
Albumin: 2.7 g/dL — ABNORMAL LOW (ref 3.5–5.0)
Anion gap: 9 (ref 5–15)
BUN: 23 mg/dL — AB (ref 6–20)
CALCIUM: 8.9 mg/dL (ref 8.9–10.3)
CHLORIDE: 108 mmol/L (ref 101–111)
CO2: 25 mmol/L (ref 22–32)
CREATININE: 2.69 mg/dL — AB (ref 0.44–1.00)
GFR calc Af Amer: 21 mL/min — ABNORMAL LOW (ref >60)
GFR calc non Af Amer: 18 mL/min — ABNORMAL LOW (ref >60)
Glucose, Bld: 124 mg/dL — ABNORMAL HIGH (ref 65–99)
Potassium: 3.1 mmol/L — ABNORMAL LOW (ref 3.5–5.1)
SODIUM: 142 mmol/L (ref 135–145)
Total Bilirubin: 0.5 mg/dL (ref 0.3–1.2)
Total Protein: 7.2 g/dL (ref 6.5–8.1)

## 2016-11-07 LAB — CBC WITH DIFFERENTIAL/PLATELET
BASOS ABS: 0 10*3/uL (ref 0–0.1)
Basophils Relative: 1 %
EOS ABS: 0 10*3/uL (ref 0–0.7)
EOS PCT: 2 %
HCT: 27.7 % — ABNORMAL LOW (ref 35.0–47.0)
HEMOGLOBIN: 9.2 g/dL — AB (ref 12.0–16.0)
LYMPHS ABS: 0.6 10*3/uL — AB (ref 1.0–3.6)
LYMPHS PCT: 24 %
MCH: 28.6 pg (ref 26.0–34.0)
MCHC: 33.2 g/dL (ref 32.0–36.0)
MCV: 86.1 fL (ref 80.0–100.0)
Monocytes Absolute: 0.1 10*3/uL — ABNORMAL LOW (ref 0.2–0.9)
Monocytes Relative: 5 %
NEUTROS PCT: 68 %
Neutro Abs: 1.8 10*3/uL (ref 1.4–6.5)
PLATELETS: 167 10*3/uL (ref 150–440)
RBC: 3.22 MIL/uL — AB (ref 3.80–5.20)
RDW: 20.5 % — ABNORMAL HIGH (ref 11.5–14.5)
WBC: 2.7 10*3/uL — AB (ref 3.6–11.0)

## 2016-11-07 MED ORDER — SODIUM CHLORIDE 0.9% FLUSH
10.0000 mL | INTRAVENOUS | Status: DC | PRN
Start: 2016-11-07 — End: 2016-11-07
  Filled 2016-11-07: qty 10

## 2016-11-07 MED ORDER — HEPARIN SOD (PORK) LOCK FLUSH 100 UNIT/ML IV SOLN: 500.0000 [IU] | Freq: Once | INTRAVENOUS | Status: DC

## 2016-11-07 MED ORDER — FULVESTRANT 250 MG/5ML IM SOLN
500.0000 mg | Freq: Once | INTRAMUSCULAR | Status: AC
  Administered 2016-11-07: 500 mg via INTRAMUSCULAR
  Filled 2016-11-07: qty 10

## 2016-11-07 NOTE — Progress Notes (Signed)
Radiation Oncology Follow up Note  Name: [name]   Date:   11/07/2016 MRN:  [id number] DOB: [birth year]    This 61 y.o. female presents to the clinic today for one-month follow-up status post palliative radiation therapy to her right upper extremity for involvement of neck necrotic metastatic breast cancer.  REFERRING PROVIDER: Kirk Ruths, MD  HPI: Patient is a 61 year old female now out 1 month having completed palliative radiation therapy to her right upper extremity for bleeding and pain associated with involvement of metastatic breast cancer.. She received 3000 cGy in 10 fractions. Seen today in routine follow-up she is doing better pain has improved. She still has some slight areas of bleeding. Patient is dealing with problems with renal failure. She has had an evaluation by nephrology.  COMPLICATIONS OF TREATMENT: none  FOLLOW UP COMPLIANCE: keeps appointments   PHYSICAL EXAM:  There were no vitals taken for this visit. A well-developed obese female in NAD she has significant lymphedema of her right upper extremity which is been present prior to treatment. Area of skin breakdown has markedly improved.  Well-developed well-nourished patient in NAD. HEENT reveals PERLA, EOMI, discs not visualized.  Oral cavity is clear. No oral mucosal lesions are identified. Neck is clear without evidence of cervical or supraclavicular adenopathy. Lungs are clear to A&P. Cardiac examination is essentially unremarkable with regular rate and rhythm without murmur rub or thrill. Abdomen is benign with no organomegaly or masses noted. Motor sensory and DTR levels are equal and symmetric in the upper and lower extremities. Cranial nerves II through XII are grossly intact. Proprioception is intact. No peripheral adenopathy or edema is identified. No motor or sensory levels are noted. Crude visual fields are within normal range.  RADIOLOGY RESULTS: No current films for review  PLAN: Present  time she achieved good palliation from radiation therapy to her right upper extremity. I'm going to turn follow-up care over to medical oncology. I would be happy to reevaluate the patient any time should further palliative treatment be indicated.  I would like to take this opportunity to thank you for allowing me to participate in the care of your patient.Armstead Peaks., MD

## 2016-11-07 NOTE — Progress Notes (Signed)
[place]  [phone]  [phone]     [name] DOB: [birth year]  MR#: [id number]  CZY#:[id number]  Patient Care Team: Kirk Ruths, MD as PCP - General (Internal Medicine)  CHIEF COMPLAINT:  Chief Complaint  Patient presents with  . Breast [place] OFFICE PROGRESS NOTE  Patient Care Team: Kirk Ruths, MD as PCP - General (Internal Medicine)  Breast cancer metastasized to bone [facility])   Staging form: Breast, AJCC 7th Edition     Clinical stage from 06/27/2013: Stage IV (T4, N2, M1) - Signed by [name], NP on 12/25/2014    Oncology History   1. RIGHT Breast cancer [Neglected] T4 N2 M1 ER positive HER-2 negative, stage IV disease of the right breast with metastases to thoracic spine, right chest wall, adrenal gland. Jan 2015- Letrozole + [name] 2016- CEA-223]  2. July 2016- [facility] study-random Taxol; Progression- CEA ~200s/Imaging  3. April 2017- ERIBULIN; AUG 14th 2017- PROGRESSION right shouler girdle/ Left adernal mass  # Feb 17 2016- GEM x2 cycles [poor tol-fevers/ Increased LFTs]; OCT 2017- CT- stable/ improved left adrenal mass  # OCT 25th 2017- DOXIL q 4w; JAN 12th CT C/A/P- STABLE/mixed response; cont Doxil;   # MARCH 19th 2018- clinical progression; Pal RT to Right UE.  # April 2018- START Faslodex + Abemaciclib.   # LYMPHEDEMA of RIGHT UE /Breast ulcerated lesion; AUG 2017 - [facility] PE- Eliquis     Breast cancer metastasized to bone ([place])   06/27/2013 Initial Diagnosis    Breast cancer metastasized to bone       Carcinoma of overlapping sites of right breast in female, estrogen receptor positive ([place])   INTERVAL HISTORY:  A pleasant 61 year old morbidly obese Caucasian female patient with above history of metastatic breast cancer- Status post multiple lines of therapy most recently started on Faslodex + AbemaciclibStarted April 2018.   Patient was  recently admitted to the hospital for Bleeding/infection of the right upper extremity shoulder malignant ulcer. Patient was treated with IV antibiotics. She was evaluated by surgery- recommended continued wound care; hyperbaric oxygen. Patient had acute renal failure with a creatinine up to 2.6; likely secondary to antibiotics. Was evaluated by nephrology. Patient had anemia needing 2 units of blood transfusion. Abemaciclib was held because of renal failure anemia.   Patient stated that she started taking Lasix for the last couple of days because she noted swelling in the legs.  Patient continues to take 1 hydrocodone for the right shoulder pain every 8 hours or so.  Denies any diarrhea. No fevers or chills. She feels much improved since discharge from the hospital. She has a few more days of Augmentin left.  REVIEW OF SYSTEMS:  A complete 10 point review of system is done which is negative except mentioned above/history of present illness.   PAST MEDICAL HISTORY :  Past Medical History:  Diagnosis Date  . Anemia   . Anginal pain ([place])   . Breast cancer ([place])    2014   . Breast cancer metastasized to bone ([place]) 11/21/2014  . Hypertension   . Last menstrual period (LMP) > 10 days ago 2007  . Lymphedema of right upper extremity   . Myocardial infarction ([place])   . Post-menopausal 2006  . Sleep apnea   . Wound drainage     PAST SURGICAL HISTORY :   Past Surgical History:  Procedure Laterality Date  . CORONARY ANGIOPLASTY WITH STENT  PLACEMENT N/A   . HERNIA REPAIR     umbilical hernia repair  . PORTACATH PLACEMENT Right 02/03/2015   Procedure: INSERTION PORT-A-CATH;  Surgeon: [name], MD;  Location: [facility] ORS;  Service: General;  Laterality: Right;    FAMILY HISTORY :   Family History  Problem Relation Age of Onset  . Atrial fibrillation Sister   . Hypertension Sister     SOCIAL HISTORY:   Social History  Substance Use Topics  . Smoking status: Former Smoker     Packs/day: 1.00    Types: Cigarettes    Quit date: 10/25/2004  . Smokeless tobacco: Never Used     Comment: stopped smoking greater than 7 years ago  . Alcohol use No    ALLERGIES:  is allergic to no known allergies.  MEDICATIONS:  Current Outpatient Prescriptions  Medication Sig Dispense Refill  . [facility] CALCIUM 600+D 600-800 MG-UNIT TABS Take 1 tablet by mouth daily. At noon  3  . ELIQUIS 5 MG TABS tablet TAKE 1 TABLET BY MOUTH TWICE DAILY 180 tablet PRN  . feeding supplement, ENSURE ENLIVE, (ENSURE ENLIVE) LIQD Take 237 mLs by mouth 3 (three) times daily between meals. 237 mL 12  . fexofenadine (ALLEGRA) 180 MG tablet Take 180 mg by mouth daily.     . furosemide (LASIX) 20 MG tablet Take 20 mg by mouth daily.    Marland Kitchen HYDROcodone-acetaminophen (NORCO) 5-325 MG tablet Take 1 tablet by mouth every 6 (six) hours as needed for moderate pain (every 6 to 8 hrs prn pain). 60 tablet 0  . omega-3 acid ethyl esters (LOVAZA) 1 G capsule Take 1 g by mouth at bedtime.    . phosphorus (PHOSPHA 250 NEUTRAL) 155-852-130 MG tablet Take 1 tablet (250 mg total) by mouth 2 (two) times daily. 180 tablet 1  . potassium chloride SA (K-DUR,KLOR-CON) 20 MEQ tablet Take 20 mEq by mouth daily.    . simvastatin (ZOCOR) 80 MG tablet Take 80 mg by mouth at bedtime.    Marland Kitchen acetaminophen (TYLENOL) 500 MG tablet Take 1,000 mg by mouth every 6 (six) hours as needed.    Marland Kitchen amoxicillin-clavulanate (AUGMENTIN) 875-125 MG tablet Take 1 tablet by mouth every 12 (twelve) hours. (Patient not taking: Reported on 11/07/2016) 4 tablet 0  . gabapentin (NEURONTIN) 300 MG capsule TAKE 1 CAPSULE BY MOUTH ONCE DAILY IN THE MORNING AND TAKE 2 CAPSULES BY MOUTH IN THE EVENING (Patient not taking: Reported on 11/07/2016) 90 capsule PRN  . ondansetron (ZOFRAN) 8 MG tablet TAKE 1 TABLET (8MG) BY MOUTH TWICE DAILY START THE DAY AFTER CHEMOTHERAPY FOR 2 DAYS THEN TAKE AS NEEDED FOR NAUSEA OR VOMITING (Patient not taking: Reported on 10/19/2016) 30 tablet 0     No current facility-administered medications for this visit.    Facility-Administered Medications Ordered in Other Visits  Medication Dose Route Frequency Provider Last Rate Last Dose  . sodium chloride 0.9 % injection 10 mL  10 mL Intracatheter PRN Forest Gleason, MD   10 mL at 07/07/15 0911  . sodium chloride flush (NS) 0.9 % injection 10 mL  10 mL Intravenous PRN Choksi, Janak, MD      . sodium chloride flush (NS) 0.9 % injection 10 mL  10 mL Intravenous PRN Charlaine Dalton R, MD   10 mL at 01/20/16 1320    PHYSICAL EXAMINATION: ECOG PERFORMANCE STATUS: 1 - Symptomatic but completely ambulatory  BP 128/80 (Patient Position: Sitting)   Pulse 86   Temp 98 F (36.7 C) (  Tympanic)   Resp 20   Ht 5' 5"  (1.651 m)   Wt (!) 305 lb 11.2 oz (138.7 kg)   BMI 50.87 kg/m   Filed Weights   11/07/16 0843  Weight: (!) 305 lb 11.2 oz (138.7 kg)    GENERAL: Well-nourished well-developed; Alert, no distress and comfortable.  She is Accompanied by her husband. A wheelchair.  Morbidly obese. EYES: no pallor or icterus OROPHARYNX: no thrush or ulceration;  NECK: supple, no masses felt LYMPH:  no palpable lymphadenopathy in the cervical, axillary or inguinal regions LUNGS: clear to auscultation and  No wheeze or crackles HEART/CVS: regular rate & rhythm and no murmurs; chronic bilateral lower extremity edema. ABDOMEN:abdomen soft, non-tender and normal bowel sounds Musculoskeletal:no cyanosis of digits and no clubbing; right upper extremity lymphedema noted; Atrophy of the right arm and/hand PSYCH: alert & oriented x 3 with fluent speech NEURO: no focal motor/sensory deficits SKIN:   Right breast ulcerated/ scabbing lesion noted; no drainage. Right anterior [4cm] shoulder; shoulder lesions [ 4x4 cm in size] noted. right shoulder posterior ulcerated mass; bandaged. LABORATORY DATA:  I have reviewed the data as listed    Component Value Date/Time   NA 142 11/07/2016 0820   NA 142  08/01/2014 1435   K 3.1 (L) 11/07/2016 0820   K 3.5 08/01/2014 1435   CL 108 11/07/2016 0820   CL 103 08/01/2014 1435   CO2 25 11/07/2016 0820   CO2 27 08/01/2014 1435   GLUCOSE 124 (H) 11/07/2016 0820   GLUCOSE 148 (H) 08/01/2014 1435   BUN 23 (H) 11/07/2016 0820   BUN 13 08/01/2014 1435   CREATININE 2.69 (H) 11/07/2016 0820   CREATININE 0.71 10/02/2014 0930   CALCIUM 8.9 11/07/2016 0820   CALCIUM 8.6 (L) 10/02/2014 0930   PROT 7.2 11/07/2016 0820   PROT 7.6 10/02/2014 0930   ALBUMIN 2.7 (L) 11/07/2016 0820   ALBUMIN 3.7 10/02/2014 0930   AST 16 11/07/2016 0820   AST 21 10/02/2014 0930   ALT 9 (L) 11/07/2016 0820   ALT 23 10/02/2014 0930   ALKPHOS 56 11/07/2016 0820   ALKPHOS 41 10/02/2014 0930   BILITOT 0.5 11/07/2016 0820   BILITOT 0.5 10/02/2014 0930   GFRNONAA 18 (L) 11/07/2016 0820   GFRNONAA >60 10/02/2014 0930   GFRAA 21 (L) 11/07/2016 0820   GFRAA >60 10/02/2014 0930    No results found for: SPEP, UPEP  Lab Results  Component Value Date   WBC 2.7 (L) 11/07/2016   NEUTROABS 1.8 11/07/2016   HGB 9.2 (L) 11/07/2016   HCT 27.7 (L) 11/07/2016   MCV 86.1 11/07/2016   PLT 167 11/07/2016      Chemistry      Component Value Date/Time   NA 142 11/07/2016 0820   NA 142 08/01/2014 1435   K 3.1 (L) 11/07/2016 0820   K 3.5 08/01/2014 1435   CL 108 11/07/2016 0820   CL 103 08/01/2014 1435   CO2 25 11/07/2016 0820   CO2 27 08/01/2014 1435   BUN 23 (H) 11/07/2016 0820   BUN 13 08/01/2014 1435   CREATININE 2.69 (H) 11/07/2016 0820   CREATININE 0.71 10/02/2014 0930      Component Value Date/Time   CALCIUM 8.9 11/07/2016 0820   CALCIUM 8.6 (L) 10/02/2014 0930   ALKPHOS 56 11/07/2016 0820   ALKPHOS 41 10/02/2014 0930   AST 16 11/07/2016 0820   AST 21 10/02/2014 0930   ALT 9 (L) 11/07/2016 0820   ALT 23 10/02/2014  0930   BILITOT 0.5 11/07/2016 0820   BILITOT 0.5 10/02/2014 0930      IMPRESSION: 1. In general, the metastatic lesions shown on the prior  exam are stable with the exception of the left adrenal metastatic lesion which is reduced in size. 2. 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 3. Extensive atherosclerosis and mild cardiomegaly. 4. There is some vague indistinctness of the anterior cortical margin of the left iliac bone. There could be some tumor eroding this bone all, along the left iliacus muscle, but I am not certain. There is enough artifact that this is questionable. There is also questionable lesions in the left upper sacrum, along with a small amount of presacral edema.  RADIOGRAPHIC STUDIES: I have personally reviewed the radiological images as listed and agreed with the findings in the report. No results found.    ASSESSMENT & PLAN:  Carcinoma of overlapping sites of right breast in female, estrogen receptor positive ([place]) Metastatic breast cancer-ER/PR positive; Her 2 Neu Negative- status post multiple lines of therapy most recently on Faslodex + Abemaclib [started Mid April 2018]. Currently Abemaciclib on HOLD sec to recent hospitalization [acute renal failure; anemia]. Patient due for Faslodex today.  #  Acute renal failure- [based and creatinine 0.8 -.9] creat 2.7; hold lasix off; check mag. Recommend follow up with Dr.Singh.   #  hypokalemia- 3.1; continue potassium once a day.    # Pain control- right chest wall pain shoulder pain secondary to malignancy. Continue hydrocodone one every 8 hours as needed. New prescription given.  # Right shoulder/chest wall infection- finish off 7 more days of augumentin.   # PE- Moderate- based on CT imaging incidental. Patient is asymptomatic. Patient is on Eliquis.   # follow up in appx 10-14 days; weekly labs; PET scan prior.   Orders Placed This Encounter  Procedures  . NM PET Image Restag (PS) Skull Base To Thigh    Standing Status:   Future    Standing Expiration Date:    01/07/2018    Order Specific Question:   Reason for Exam (SYMPTOM  OR DIAGNOSIS REQUIRED)    Answer:   breast cancer    Order Specific Question:   Is the patient pregnant?    Answer:   No    Order Specific Question:   Preferred imaging location?    Answer:   [facility]  . CBC with Differential/Platelet    Standing Status:   Future    Standing Expiration Date:   11/07/2017  . Basic metabolic panel    Standing Status:   Future    Standing Expiration Date:   11/07/2017  . Magnesium    Standing Status:   Future    Standing Expiration Date:   11/07/2017   All questions were answered. The patient knows to call the clinic with any problems, questions or concerns.      Cammie Sickle, MD 11/08/2016 8:08 AMp  Review of Systems  All other systems reviewed and are negative.          11/08/2016

## 2016-11-07 NOTE — Assessment & Plan Note (Addendum)
Metastatic breast cancer-ER/PR positive; Her 2 Neu Negative- status post multiple lines of therapy most recently on Faslodex + Abemaclib [started Mid April 2018]. Currently Abemaciclib on HOLD sec to recent hospitalization [acute renal failure; anemia]. Patient due for Faslodex today.  #  Acute renal failure- [based and creatinine 0.8 -.9] creat 2.7; hold lasix off; check mag. Recommend follow up with Dr.Singh.   #  hypokalemia- 3.1; continue potassium once a day.    # Pain control- right chest wall pain shoulder pain secondary to malignancy. Continue hydrocodone one every 8 hours as needed. New prescription given.  # Right shoulder/chest wall infection- finish off 7 more days of augumentin.   # PE- Moderate- based on CT imaging incidental. Patient is asymptomatic. Patient is on Eliquis.   # follow up in appx 10-14 days; weekly labs; PET scan prior.   Addendum: Patient has acute renal failure; contrast CT would be prohibitive because of the renal failure. Noncontrast CT would be suboptimal for the evaluation of her metastatic breast cancer. Hence would recommend a PET scan for further evaluation of the metastatic disease.

## 2016-11-08 LAB — CEA: CEA: 81.5 ng/mL — ABNORMAL HIGH (ref 0.0–4.7)

## 2016-11-08 LAB — CANCER ANTIGEN 27.29: CA 27.29: 28.3 U/mL (ref 0.0–38.6)

## 2016-11-11 ENCOUNTER — Ambulatory Visit: Payer: BLUE CROSS/BLUE SHIELD | Admitting: Internal Medicine

## 2016-11-11 ENCOUNTER — Telehealth: Payer: Self-pay | Admitting: *Deleted

## 2016-11-11 NOTE — Telephone Encounter (Signed)
Order already received from [facility] yesterday- pending md signature

## 2016-11-11 NOTE — Telephone Encounter (Signed)
Requesting an order be sent to [facility] for her dressings

## 2016-11-11 NOTE — Telephone Encounter (Signed)
Order was faxed yesterday

## 2016-11-14 ENCOUNTER — Inpatient Hospital Stay: Payer: BC Managed Care – PPO

## 2016-11-15 ENCOUNTER — Inpatient Hospital Stay: Payer: BC Managed Care – PPO

## 2016-11-15 DIAGNOSIS — C50811 Malignant neoplasm of overlapping sites of right female breast: Secondary | ICD-10-CM | POA: Diagnosis not present

## 2016-11-15 DIAGNOSIS — Z170 Estrogen receptor positive status [ER+]: Principal | ICD-10-CM

## 2016-11-15 LAB — CBC WITH DIFFERENTIAL/PLATELET
BASOS ABS: 0 10*3/uL (ref 0–0.1)
Basophils Relative: 1 %
EOS ABS: 0.1 10*3/uL (ref 0–0.7)
EOS PCT: 2 %
HCT: 26.1 % — ABNORMAL LOW (ref 35.0–47.0)
Hemoglobin: 8.8 g/dL — ABNORMAL LOW (ref 12.0–16.0)
Lymphocytes Relative: 17 %
Lymphs Abs: 0.6 10*3/uL — ABNORMAL LOW (ref 1.0–3.6)
MCH: 29 pg (ref 26.0–34.0)
MCHC: 33.5 g/dL (ref 32.0–36.0)
MCV: 86.7 fL (ref 80.0–100.0)
Monocytes Absolute: 0.3 10*3/uL (ref 0.2–0.9)
Monocytes Relative: 7 %
NEUTROS PCT: 73 %
Neutro Abs: 2.8 10*3/uL (ref 1.4–6.5)
PLATELETS: 167 10*3/uL (ref 150–440)
RBC: 3.02 MIL/uL — AB (ref 3.80–5.20)
RDW: 21.9 % — ABNORMAL HIGH (ref 11.5–14.5)
WBC: 3.8 10*3/uL (ref 3.6–11.0)

## 2016-11-15 LAB — BASIC METABOLIC PANEL
ANION GAP: 5 (ref 5–15)
BUN: 13 mg/dL (ref 6–20)
CO2: 28 mmol/L (ref 22–32)
Calcium: 8.7 mg/dL — ABNORMAL LOW (ref 8.9–10.3)
Chloride: 110 mmol/L (ref 101–111)
Creatinine, Ser: 1.48 mg/dL — ABNORMAL HIGH (ref 0.44–1.00)
GFR calc Af Amer: 44 mL/min — ABNORMAL LOW (ref >60)
GFR calc non Af Amer: 38 mL/min — ABNORMAL LOW (ref >60)
Glucose, Bld: 150 mg/dL — ABNORMAL HIGH (ref 65–99)
POTASSIUM: 2.9 mmol/L — AB (ref 3.5–5.1)
SODIUM: 143 mmol/L (ref 135–145)

## 2016-11-15 LAB — MAGNESIUM: Magnesium: 1.6 mg/dL — ABNORMAL LOW (ref 1.7–2.4)

## 2016-11-17 ENCOUNTER — Ambulatory Visit: Payer: BLUE CROSS/BLUE SHIELD

## 2016-11-22 ENCOUNTER — Other Ambulatory Visit: Payer: Self-pay | Admitting: Internal Medicine

## 2016-11-22 ENCOUNTER — Ambulatory Visit: Payer: BLUE CROSS/BLUE SHIELD

## 2016-11-23 ENCOUNTER — Ambulatory Visit: Payer: BLUE CROSS/BLUE SHIELD

## 2016-11-23 ENCOUNTER — Inpatient Hospital Stay: Payer: BC Managed Care – PPO

## 2016-11-23 ENCOUNTER — Telehealth: Payer: Self-pay | Admitting: Internal Medicine

## 2016-11-23 ENCOUNTER — Inpatient Hospital Stay: Payer: BC Managed Care – PPO | Admitting: Internal Medicine

## 2016-11-23 ENCOUNTER — Other Ambulatory Visit: Payer: Self-pay | Admitting: Internal Medicine

## 2016-11-23 ENCOUNTER — Telehealth: Payer: Self-pay | Admitting: *Deleted

## 2016-11-23 MED ORDER — LEVOFLOXACIN 250 MG PO TABS: ORAL_TABLET | ORAL | 0 refills | Status: DC

## 2016-11-23 NOTE — Progress Notes (Deleted)
[name]  [phone]  [phone]     [name] DOB: [birth year]  MR#: [id number]  BMW#:[id number]  Patient Care Team: Kirk Ruths, MD as PCP - General (Internal Medicine)  CHIEF COMPLAINT:  No chief complaint on file.   [facility] OFFICE PROGRESS NOTE  Patient Care Team: Kirk Ruths, MD as PCP - General (Internal Medicine)  Breast cancer metastasized to bone [facility])   Staging form: Breast, AJCC 7th Edition     Clinical stage from 06/27/2013: Stage IV (T4, N2, M1) - Signed by [name], NP on 12/25/2014    Oncology History   1. RIGHT Breast cancer [Neglected] T4 N2 M1 ER positive HER-2 negative, stage IV disease of the right breast with metastases to thoracic spine, right chest wall, adrenal gland. Jan 2015- Letrozole + [name] 2016- CEA-223]  2. July 2016- [facility] study-random Taxol; Progression- CEA ~200s/Imaging  3. April 2017- ERIBULIN; AUG 14th 2017- PROGRESSION right shouler girdle/ Left adernal mass  # Feb 17 2016- GEM x2 cycles [poor tol-fevers/ Increased LFTs]; OCT 2017- CT- stable/ improved left adrenal mass  # OCT 25th 2017- DOXIL q 4w; JAN 12th CT C/A/P- STABLE/mixed response; cont Doxil;   # MARCH 19th 2018- clinical progression; Pal RT to Right UE.  # April 2018- START Faslodex + Abemaciclib.   # LYMPHEDEMA of RIGHT UE /Breast ulcerated lesion; AUG 2017 - [facility] PE- Eliquis     Breast cancer metastasized to bone ([place])   06/27/2013 Initial Diagnosis    Breast cancer metastasized to bone       Carcinoma of overlapping sites of right breast in female, estrogen receptor positive ([name])   INTERVAL HISTORY:  A pleasant 61 year old morbidly obese Caucasian female patient with above history of metastatic breast cancer- Status post multiple lines of therapy most recently started on Faslodex + AbemaciclibStarted April 2018.   Patient was recently admitted to the hospital  for Bleeding/infection of the right upper extremity shoulder malignant ulcer. Patient was treated with IV antibiotics. She was evaluated by surgery- recommended continued wound care; hyperbaric oxygen. Patient had acute renal failure with a creatinine up to 2.6; likely secondary to antibiotics. Was evaluated by nephrology. Patient had anemia needing 2 units of blood transfusion. Abemaciclib was held because of renal failure anemia.   Patient stated that she started taking Lasix for the last couple of days because she noted swelling in the legs.  Patient continues to take 1 hydrocodone for the right shoulder pain every 8 hours or so.  Denies any diarrhea. No fevers or chills. She feels much improved since discharge from the hospital. She has a few more days of Augmentin left.  REVIEW OF SYSTEMS:  A complete 10 point review of system is done which is negative except mentioned above/history of present illness.   PAST MEDICAL HISTORY :  Past Medical History:  Diagnosis Date  . Anemia   . Anginal pain ([place])   . Breast cancer ([name])    2014   . Breast cancer metastasized to bone ([name]) 11/21/2014  . Hypertension   . Last menstrual period (LMP) > 10 days ago 2007  . Lymphedema of right upper extremity   . Myocardial infarction ([name])   . Post-menopausal 2006  . Sleep apnea   . Wound drainage     PAST SURGICAL HISTORY :   Past Surgical History:  Procedure Laterality Date  . CORONARY ANGIOPLASTY WITH STENT PLACEMENT N/A   . HERNIA  REPAIR     umbilical hernia repair  . PORTACATH PLACEMENT Right 02/03/2015   Procedure: INSERTION PORT-A-CATH;  Surgeon: [name], MD;  Location: [facility] ORS;  Service: General;  Laterality: Right;    FAMILY HISTORY :   Family History  Problem Relation Age of Onset  . Atrial fibrillation Sister   . Hypertension Sister     SOCIAL HISTORY:   Social History  Substance Use Topics  . Smoking status: Former Smoker    Packs/day: 1.00    Types: Cigarettes     Quit date: 10/25/2004  . Smokeless tobacco: Never Used     Comment: stopped smoking greater than 7 years ago  . Alcohol use No    ALLERGIES:  is allergic to no known allergies.  MEDICATIONS:  Current Outpatient Prescriptions  Medication Sig Dispense Refill  . acetaminophen (TYLENOL) 500 MG tablet Take 1,000 mg by mouth every 6 (six) hours as needed.    . [employer] CALCIUM 600+D 600-800 MG-UNIT TABS Take 1 tablet by mouth daily. At noon  3  . ELIQUIS 5 MG TABS tablet TAKE 1 TABLET BY MOUTH TWICE DAILY 180 tablet PRN  . feeding supplement, ENSURE ENLIVE, (ENSURE ENLIVE) LIQD Take 237 mLs by mouth 3 (three) times daily between meals. 237 mL 12  . fexofenadine (ALLEGRA) 180 MG tablet Take 180 mg by mouth daily.     . furosemide (LASIX) 20 MG tablet Take 20 mg by mouth daily.    Marland Kitchen gabapentin (NEURONTIN) 300 MG capsule TAKE 1 CAPSULE BY MOUTH ONCE DAILY IN THE MORNING AND TAKE 2 CAPSULES BY MOUTH IN THE EVENING (Patient not taking: Reported on 11/07/2016) 90 capsule PRN  . HYDROcodone-acetaminophen (NORCO) 5-325 MG tablet Take 1 tablet by mouth every 6 (six) hours as needed for moderate pain (every 6 to 8 hrs prn pain). 60 tablet 0  . omega-3 acid ethyl esters (LOVAZA) 1 G capsule Take 1 g by mouth at bedtime.    . ondansetron (ZOFRAN) 8 MG tablet TAKE 1 TABLET (8MG) BY MOUTH TWICE DAILY START THE DAY AFTER CHEMOTHERAPY FOR 2 DAYS THEN TAKE AS NEEDED FOR NAUSEA OR VOMITING (Patient not taking: Reported on 10/19/2016) 30 tablet 0  . phosphorus (PHOSPHA 250 NEUTRAL) 155-852-130 MG tablet Take 1 tablet (250 mg total) by mouth 2 (two) times daily. 180 tablet 1  . potassium chloride SA (K-DUR,KLOR-CON) 20 MEQ tablet Take 20 mEq by mouth daily.    . simvastatin (ZOCOR) 80 MG tablet Take 80 mg by mouth at bedtime.     No current facility-administered medications for this visit.    Facility-Administered Medications Ordered in Other Visits  Medication Dose Route Frequency Provider Last Rate Last Dose  . sodium  chloride 0.9 % injection 10 mL  10 mL Intracatheter PRN Forest Gleason, MD   10 mL at 07/07/15 0911  . sodium chloride flush (NS) 0.9 % injection 10 mL  10 mL Intravenous PRN Choksi, Janak, MD      . sodium chloride flush (NS) 0.9 % injection 10 mL  10 mL Intravenous PRN Cammie Sickle, MD   10 mL at 01/20/16 1320    PHYSICAL EXAMINATION: ECOG PERFORMANCE STATUS: 1 - Symptomatic but completely ambulatory  There were no vitals taken for this visit.  There were no vitals filed for this visit.  GENERAL: Well-nourished well-developed; Alert, no distress and comfortable.  She is Accompanied by her husband. A wheelchair.  Morbidly obese. EYES: no pallor or icterus OROPHARYNX: no thrush or ulceration;  NECK: supple, no masses felt LYMPH:  no palpable lymphadenopathy in the cervical, axillary or inguinal regions LUNGS: clear to auscultation and  No wheeze or crackles HEART/CVS: regular rate & rhythm and no murmurs; chronic bilateral lower extremity edema. ABDOMEN:abdomen soft, non-tender and normal bowel sounds Musculoskeletal:no cyanosis of digits and no clubbing; right upper extremity lymphedema noted; Atrophy of the right arm and/hand PSYCH: alert & oriented x 3 with fluent speech NEURO: no focal motor/sensory deficits SKIN:   Right breast ulcerated/ scabbing lesion noted; no drainage. Right anterior [4cm] shoulder; shoulder lesions [ 4x4 cm in size] noted. right shoulder posterior ulcerated mass; bandaged. LABORATORY DATA:  I have reviewed the data as listed    Component Value Date/Time   NA 143 11/15/2016 0929   NA 142 08/01/2014 1435   K 2.9 (L) 11/15/2016 0929   K 3.5 08/01/2014 1435   CL 110 11/15/2016 0929   CL 103 08/01/2014 1435   CO2 28 11/15/2016 0929   CO2 27 08/01/2014 1435   GLUCOSE 150 (H) 11/15/2016 0929   GLUCOSE 148 (H) 08/01/2014 1435   BUN 13 11/15/2016 0929   BUN 13 08/01/2014 1435   CREATININE 1.48 (H) 11/15/2016 0929   CREATININE 0.71 10/02/2014 0930    CALCIUM 8.7 (L) 11/15/2016 0929   CALCIUM 8.6 (L) 10/02/2014 0930   PROT 7.2 11/07/2016 0820   PROT 7.6 10/02/2014 0930   ALBUMIN 2.7 (L) 11/07/2016 0820   ALBUMIN 3.7 10/02/2014 0930   AST 16 11/07/2016 0820   AST 21 10/02/2014 0930   ALT 9 (L) 11/07/2016 0820   ALT 23 10/02/2014 0930   ALKPHOS 56 11/07/2016 0820   ALKPHOS 41 10/02/2014 0930   BILITOT 0.5 11/07/2016 0820   BILITOT 0.5 10/02/2014 0930   GFRNONAA 38 (L) 11/15/2016 0929   GFRNONAA >60 10/02/2014 0930   GFRAA 44 (L) 11/15/2016 0929   GFRAA >60 10/02/2014 0930    No results found for: SPEP, UPEP  Lab Results  Component Value Date   WBC 3.8 11/15/2016   NEUTROABS 2.8 11/15/2016   HGB 8.8 (L) 11/15/2016   HCT 26.1 (L) 11/15/2016   MCV 86.7 11/15/2016   PLT 167 11/15/2016      Chemistry      Component Value Date/Time   NA 143 11/15/2016 0929   NA 142 08/01/2014 1435   K 2.9 (L) 11/15/2016 0929   K 3.5 08/01/2014 1435   CL 110 11/15/2016 0929   CL 103 08/01/2014 1435   CO2 28 11/15/2016 0929   CO2 27 08/01/2014 1435   BUN 13 11/15/2016 0929   BUN 13 08/01/2014 1435   CREATININE 1.48 (H) 11/15/2016 0929   CREATININE 0.71 10/02/2014 0930      Component Value Date/Time   CALCIUM 8.7 (L) 11/15/2016 0929   CALCIUM 8.6 (L) 10/02/2014 0930   ALKPHOS 56 11/07/2016 0820   ALKPHOS 41 10/02/2014 0930   AST 16 11/07/2016 0820   AST 21 10/02/2014 0930   ALT 9 (L) 11/07/2016 0820   ALT 23 10/02/2014 0930   BILITOT 0.5 11/07/2016 0820   BILITOT 0.5 10/02/2014 0930      IMPRESSION: 1. In general, the metastatic lesions shown on the prior exam are stable with the exception of the left adrenal metastatic lesion which is reduced in size. 2. 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 3. Extensive atherosclerosis and mild cardiomegaly. 4. There is some vague indistinctness of  the anterior cortical margin of the left iliac bone.  There could be some tumor eroding this bone all, along the left iliacus muscle, but I am not certain. There is enough artifact that this is questionable. There is also questionable lesions in the left upper sacrum, along with a small amount of presacral edema.  RADIOGRAPHIC STUDIES: I have personally reviewed the radiological images as listed and agreed with the findings in the report. No results found.    ASSESSMENT & PLAN:  No problem-specific Assessment & Plan notes found for this encounter.  No orders of the defined types were placed in this encounter.  All questions were answered. The patient knows to call the clinic with any problems, questions or concerns.      Cammie Sickle, MD 11/23/2016 8:14 AMp  Review of Systems  All other systems reviewed and are negative.          11/23/2016

## 2016-11-23 NOTE — Telephone Encounter (Signed)
-----   Message from [place] sent at 11/23/2016  9:02 AM EDT ----- She will not be at her appt today-she wondered what to do about the chemo pill? I told her that no appt until July- whos covering provider?

## 2016-11-23 NOTE — Telephone Encounter (Signed)
Called in a prescription for Levaquin for fever.   # Patient finally approved; Patient is awaiting a PET scan next week; followed by Dr. Grayland Ormond. My plan- if renal function continues to improve [recent acute renal failure]- continue Faslodex; and re-start abemaciclib. Discussed with pt. In agreement.

## 2016-11-23 NOTE — Telephone Encounter (Signed)
Patient's call returned. Discussed patient's concerns. Pt states that her husband had to have a medical procedure and multiple apts today. For this reason, she was unable to keep her apts. She request a sooner apt with md to further evaluate the need to start back on her oral chemotherapy tablet. Pt understands that she needs her labs checked to determine if it is safe to start back on chemotherapy.  I explained to patient that I would obtain a sooner apt and have our scheduling team contact her with the future apts by this afternoon.  RN spoke with Dr. Rogue Bussing - per Dr. Jacinto Reap. Please schedule patient for lab/md on covering provider's schedule next week. Keep scheduled labs - cbc, metc, ca27.29 and cea as previously scheduled.   msg sent back to scheduling team to arrange for new apts per md.

## 2016-11-23 NOTE — Assessment & Plan Note (Deleted)
Metastatic breast cancer-ER/PR positive; Her 2 Neu Negative- status post multiple lines of therapy most recently on Faslodex + Abemaclib [started Mid April 2018]. Currently Abemaciclib on HOLD sec to recent hospitalization [acute renal failure; anemia]. Patient due for Faslodex today.  #  Acute renal failure- [based and creatinine 0.8 -.9] creat 2.7; hold lasix off; check mag. Recommend follow up with Dr.Singh.   #  hypokalemia- 3.1; continue potassium once a day.    # Pain control- right chest wall pain shoulder pain secondary to malignancy. Continue hydrocodone one every 8 hours as needed. New prescription given.  # Right shoulder/chest wall infection- finish off 7 more days of augumentin.   # PE- Moderate- based on CT imaging incidental. Patient is asymptomatic. Patient is on Eliquis.   # follow up in appx 10-14 days; weekly labs; PET scan prior.   Addendum: Patient has acute renal failure; contrast CT would be prohibitive because of the renal failure. Noncontrast CT would be suboptimal for the evaluation of her metastatic breast cancer. Hence would recommend a PET scan for further evaluation of the metastatic disease.

## 2016-11-24 IMAGING — NM NM BONE WHOLE BODY
2 series · 6 of 6 positions shown · non-contrast
Comparison: 07/24/2015

CLINICAL DATA: Breast cancer metastasized to bone.

EXAM:
NUCLEAR MEDICINE WHOLE BODY BONE SCAN
TECHNIQUE: Whole body anterior and posterior images were obtained approximately
3 hours after intravenous injection of radiopharmaceutical.
RADIOPHARMACEUTICALS:  23.3 mCi Yechnetium-YYm MDP IV

[Series 1000: 3 hr wholebody · 2.40mm/px · 2 of 2 frames shown]
[frame 1/2]
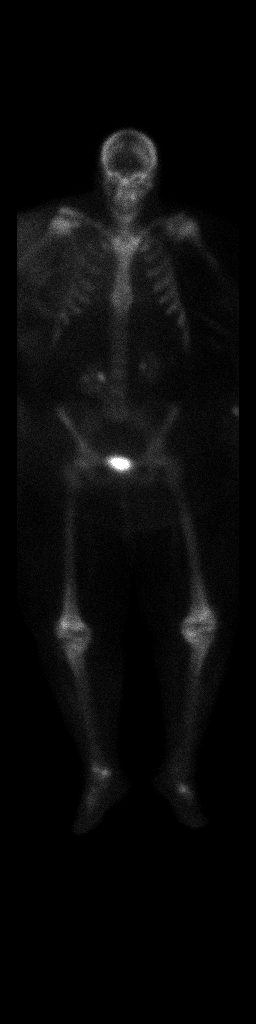
[frame 2/2]
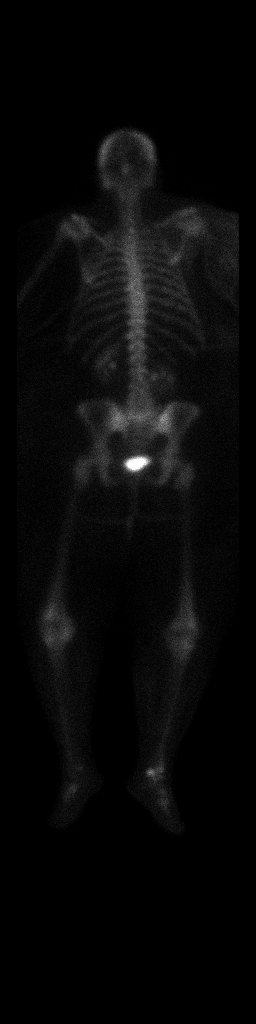

[Series 1000: statics · 2.40mm/px · 2 acquisitions, 4 frames shown]
[im 1/2]
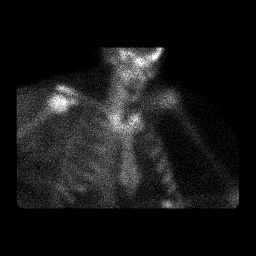
[im 1/2]
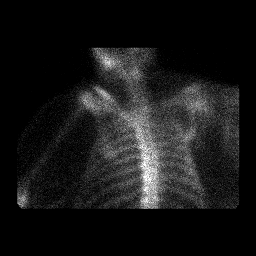
[im 2/2]
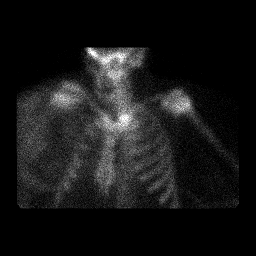
[im 2/2]
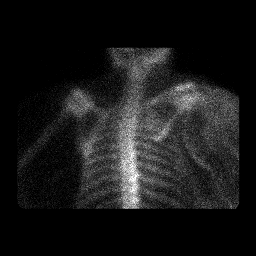

[6 of 6 positions shown; findings below may reference images not displayed]

FINDINGS: No areas of suspicious bony uptake to suggest osseous metastatic
disease. Degenerative uptake in the knees, ankles, feet and
shoulders bilaterally.

Soft tissue activity is unremarkable.
IMPRESSION: No evidence of bony metastatic disease.

## 2016-11-28 NOTE — Addendum Note (Signed)
Encounter addended by: Ane Payment, RT on: 11/28/2016  8:42 AM<BR>    Actions taken: Imaging Exam begun

## 2016-11-29 ENCOUNTER — Ambulatory Visit
Admission: RE | Admit: 2016-11-29 | Discharge: 2016-11-29 | Disposition: A | Discharge status: Home / Self Care | Payer: BC Managed Care – PPO | Source: Ambulatory Visit | Attending: Internal Medicine | Admitting: Internal Medicine

## 2016-11-29 DIAGNOSIS — I700 Atherosclerosis of aorta: Secondary | ICD-10-CM | POA: Diagnosis not present

## 2016-11-29 DIAGNOSIS — N179 Acute kidney failure, unspecified: Secondary | ICD-10-CM

## 2016-11-29 DIAGNOSIS — I2510 Atherosclerotic heart disease of native coronary artery without angina pectoris: Secondary | ICD-10-CM | POA: Diagnosis not present

## 2016-11-29 DIAGNOSIS — C787 Secondary malignant neoplasm of liver and intrahepatic bile duct: Secondary | ICD-10-CM | POA: Insufficient documentation

## 2016-11-29 DIAGNOSIS — C50811 Malignant neoplasm of overlapping sites of right female breast: Secondary | ICD-10-CM | POA: Insufficient documentation

## 2016-11-29 DIAGNOSIS — Z170 Estrogen receptor positive status [ER+]: Secondary | ICD-10-CM | POA: Diagnosis not present

## 2016-11-29 LAB — GLUCOSE, CAPILLARY: Glucose-Capillary: 162 mg/dL — ABNORMAL HIGH (ref 65–99)

## 2016-11-29 MED ORDER — FLUDEOXYGLUCOSE F - 18 (FDG) INJECTION
13.0300 | Freq: Once | INTRAVENOUS | Status: AC | PRN
  Administered 2016-11-29: 13.03 via INTRAVENOUS

## 2016-12-01 ENCOUNTER — Inpatient Hospital Stay: Payer: BC Managed Care – PPO | Attending: Oncology

## 2016-12-01 ENCOUNTER — Inpatient Hospital Stay (HOSPITAL_BASED_OUTPATIENT_CLINIC_OR_DEPARTMENT_OTHER): Payer: BC Managed Care – PPO | Admitting: Oncology

## 2016-12-01 VITALS — BP 135/82 | HR 106 | Temp 98.5°F | Wt 287.4 lb

## 2016-12-01 DIAGNOSIS — G893 Neoplasm related pain (acute) (chronic): Secondary | ICD-10-CM | POA: Diagnosis not present

## 2016-12-01 DIAGNOSIS — C50811 Malignant neoplasm of overlapping sites of right female breast: Secondary | ICD-10-CM | POA: Diagnosis present

## 2016-12-01 DIAGNOSIS — I252 Old myocardial infarction: Secondary | ICD-10-CM | POA: Insufficient documentation

## 2016-12-01 DIAGNOSIS — R5383 Other fatigue: Secondary | ICD-10-CM | POA: Insufficient documentation

## 2016-12-01 DIAGNOSIS — D649 Anemia, unspecified: Secondary | ICD-10-CM

## 2016-12-01 DIAGNOSIS — R531 Weakness: Secondary | ICD-10-CM | POA: Diagnosis not present

## 2016-12-01 DIAGNOSIS — Z87891 Personal history of nicotine dependence: Secondary | ICD-10-CM

## 2016-12-01 DIAGNOSIS — E876 Hypokalemia: Secondary | ICD-10-CM

## 2016-12-01 DIAGNOSIS — Z79899 Other long term (current) drug therapy: Secondary | ICD-10-CM

## 2016-12-01 DIAGNOSIS — Z170 Estrogen receptor positive status [ER+]: Secondary | ICD-10-CM | POA: Insufficient documentation

## 2016-12-01 DIAGNOSIS — I700 Atherosclerosis of aorta: Secondary | ICD-10-CM

## 2016-12-01 DIAGNOSIS — C7951 Secondary malignant neoplasm of bone: Secondary | ICD-10-CM

## 2016-12-01 DIAGNOSIS — G4730 Sleep apnea, unspecified: Secondary | ICD-10-CM

## 2016-12-01 DIAGNOSIS — C7970 Secondary malignant neoplasm of unspecified adrenal gland: Secondary | ICD-10-CM | POA: Diagnosis not present

## 2016-12-01 DIAGNOSIS — I2699 Other pulmonary embolism without acute cor pulmonale: Secondary | ICD-10-CM | POA: Insufficient documentation

## 2016-12-01 LAB — CBC WITH DIFFERENTIAL/PLATELET
Basophils Absolute: 0.1 10*3/uL (ref 0–0.1)
Basophils Relative: 1 %
EOS PCT: 4 %
Eosinophils Absolute: 0.3 10*3/uL (ref 0–0.7)
HEMATOCRIT: 27.6 % — AB (ref 35.0–47.0)
Hemoglobin: 9.1 g/dL — ABNORMAL LOW (ref 12.0–16.0)
LYMPHS ABS: 0.8 10*3/uL — AB (ref 1.0–3.6)
Lymphocytes Relative: 11 %
MCH: 29.4 pg (ref 26.0–34.0)
MCHC: 33.1 g/dL (ref 32.0–36.0)
MCV: 89 fL (ref 80.0–100.0)
MONO ABS: 0.5 10*3/uL (ref 0.2–0.9)
Monocytes Relative: 6 %
Neutro Abs: 6.2 10*3/uL (ref 1.4–6.5)
Neutrophils Relative %: 78 %
PLATELETS: 237 10*3/uL (ref 150–440)
RBC: 3.1 MIL/uL — AB (ref 3.80–5.20)
RDW: 21.5 % — AB (ref 11.5–14.5)
WBC: 7.9 10*3/uL (ref 3.6–11.0)

## 2016-12-01 LAB — COMPREHENSIVE METABOLIC PANEL
ALT: 8 U/L — AB (ref 14–54)
AST: 16 U/L (ref 15–41)
Albumin: 3 g/dL — ABNORMAL LOW (ref 3.5–5.0)
Alkaline Phosphatase: 60 U/L (ref 38–126)
Anion gap: 9 (ref 5–15)
BILIRUBIN TOTAL: 0.4 mg/dL (ref 0.3–1.2)
BUN: 16 mg/dL (ref 6–20)
CHLORIDE: 105 mmol/L (ref 101–111)
CO2: 23 mmol/L (ref 22–32)
Calcium: 9.2 mg/dL (ref 8.9–10.3)
Creatinine, Ser: 0.92 mg/dL (ref 0.44–1.00)
Glucose, Bld: 172 mg/dL — ABNORMAL HIGH (ref 65–99)
Potassium: 3.9 mmol/L (ref 3.5–5.1)
Sodium: 137 mmol/L (ref 135–145)
TOTAL PROTEIN: 7.6 g/dL (ref 6.5–8.1)

## 2016-12-01 NOTE — Progress Notes (Signed)
[name]  [phone] [phone]) [phone]  ID: LONDAN COPLEN OB: [birth year]  MR#: [id number]  GDJ#:[id number]  Patient Care Team: Kirk Ruths, MD as PCP - General (Internal Medicine)  CHIEF COMPLAINT: Carcinoma of overlapping sites of right breast in female, estrogen receptor positive [facility])  INTERVAL HISTORY: Patient returns to clinic today for further evaluation and discussion of her imaging results. She is tolerating Faslodex + Abemaciclib well without significant side effects. She continues to have pain, but is well-controlled on her current narcotic regimen. She has no neurologic complaints. She denies any recent fevers. She has a fair appetite, but denies weight loss. She has no chest pain or shortness of breath. She denies any nausea, vomiting, constipation, or diarrhea. She has no urinary complaints. Patient offers no further specific complaints.   REVIEW OF SYSTEMS:   Review of Systems  Constitutional: Positive for malaise/fatigue. Negative for fever and weight loss.  Respiratory: Negative.  Negative for cough and shortness of breath.   Cardiovascular: Negative.  Negative for chest pain and leg swelling.  Gastrointestinal: Negative.  Negative for abdominal pain.  Genitourinary: Negative.   Musculoskeletal: Negative.   Skin: Negative.  Negative for rash.  Neurological: Positive for weakness.  Psychiatric/Behavioral: Negative.  The patient is not nervous/anxious.     As per HPI. Otherwise, a complete review of systems is negative.  PAST MEDICAL HISTORY: Past Medical History:  Diagnosis Date  . Anemia   . Anginal pain ([place])   . Breast cancer ([place])    2014   . Breast cancer metastasized to bone ([place]) 11/21/2014  . Hypertension   . Last menstrual period (LMP) > 10 days ago 2007  . Lymphedema of right upper extremity   . Myocardial infarction ([place])   . Post-menopausal 2006  . Sleep apnea   . Wound drainage     PAST SURGICAL  HISTORY: Past Surgical History:  Procedure Laterality Date  . CORONARY ANGIOPLASTY WITH STENT PLACEMENT N/A   . HERNIA REPAIR     umbilical hernia repair  . PORTACATH PLACEMENT Right 02/03/2015   Procedure: INSERTION PORT-A-CATH;  Surgeon: [name], MD;  Location: [facility] ORS;  Service: General;  Laterality: Right;    FAMILY HISTORY: Family History  Problem Relation Age of Onset  . Atrial fibrillation Sister   . Hypertension Sister     ADVANCED DIRECTIVES (Y/N):  N  HEALTH MAINTENANCE: Social History  Substance Use Topics  . Smoking status: Former Smoker    Packs/day: 1.00    Types: Cigarettes    Quit date: 10/25/2004  . Smokeless tobacco: Never Used     Comment: stopped smoking greater than 7 years ago  . Alcohol use No     Colonoscopy:  PAP:  Bone density:  Lipid panel:  Allergies  Allergen Reactions  . No Known Allergies     Current Outpatient Prescriptions  Medication Sig Dispense Refill  . acetaminophen (TYLENOL) 500 MG tablet Take 1,000 mg by mouth every 6 (six) hours as needed.    . [employer] CALCIUM 600+D 600-800 MG-UNIT TABS Take 1 tablet by mouth daily. At noon  3  . ELIQUIS 5 MG TABS tablet TAKE 1 TABLET BY MOUTH TWICE DAILY 180 tablet PRN  . feeding supplement, ENSURE ENLIVE, (ENSURE ENLIVE) LIQD Take 237 mLs by mouth 3 (three) times daily between meals. 237 mL 12  . fexofenadine (ALLEGRA) 180 MG tablet Take 180 mg by mouth daily.     . furosemide (LASIX) 20 MG tablet  Take 20 mg by mouth daily.    Marland Kitchen gabapentin (NEURONTIN) 300 MG capsule TAKE 1 CAPSULE BY MOUTH ONCE DAILY IN THE MORNING AND TAKE 2 CAPSULES BY MOUTH IN THE EVENING 90 capsule PRN  . HYDROcodone-acetaminophen (NORCO) 5-325 MG tablet Take 1 tablet by mouth every 6 (six) hours as needed for moderate pain (every 6 to 8 hrs prn pain). 60 tablet 0  . levofloxacin (LEVAQUIN) 250 MG tablet Take 2 pills today; and then 1 pill every day 8 tablet 0  . omega-3 acid ethyl esters (LOVAZA) 1 G capsule Take  1 g by mouth at bedtime.    . ondansetron (ZOFRAN) 8 MG tablet TAKE 1 TABLET (8MG ) BY MOUTH TWICE DAILY START THE DAY AFTER CHEMOTHERAPY FOR 2 DAYS THEN TAKE AS NEEDED FOR NAUSEA OR VOMITING 30 tablet 0  . phosphorus (PHOSPHA 250 NEUTRAL) [phone] MG tablet Take 1 tablet (250 mg total) by mouth 2 (two) times daily. 180 tablet 1  . potassium chloride SA (K-DUR,KLOR-CON) 20 MEQ tablet Take 20 mEq by mouth daily.    . simvastatin (ZOCOR) 80 MG tablet Take 80 mg by mouth at bedtime.     No current facility-administered medications for this visit.    Facility-Administered Medications Ordered in Other Visits  Medication Dose Route Frequency Provider Last Rate Last Dose  . sodium chloride 0.9 % injection 10 mL  10 mL Intracatheter PRN Forest Gleason, MD   10 mL at 07/07/15 0911  . sodium chloride flush (NS) 0.9 % injection 10 mL  10 mL Intravenous PRN Choksi, Janak, MD      . sodium chloride flush (NS) 0.9 % injection 10 mL  10 mL Intravenous PRN Charlaine Dalton R, MD   10 mL at 01/20/16 1320    OBJECTIVE: Vitals:   12/01/16 1135  BP: 135/82  Pulse: (!) 106  Temp: 98.5 F (36.9 C)     Body mass index is 47.82 kg/m.    ECOG FS:1 - Symptomatic but completely ambulatory  General: Well-developed, well-nourished, no acute distress. Eyes: Pink conjunctiva, anicteric sclera. Lungs: Clear to auscultation bilaterally. Heart: Regular rate and rhythm. No rubs, murmurs, or gallops. Abdomen: Soft, nontender, nondistended. No organomegaly noted, normoactive bowel sounds. Musculoskeletal: No edema, cyanosis, or clubbing. Neuro: Alert, answering all questions appropriately. Cranial nerves grossly intact. Skin: No rashes or petechiae noted. Psych: Normal affect.   LAB RESULTS:  Lab Results  Component Value Date   NA 137 12/01/2016   K 3.9 12/01/2016   CL 105 12/01/2016   CO2 23 12/01/2016   GLUCOSE 172 (H) 12/01/2016   BUN 16 12/01/2016   CREATININE 0.92 12/01/2016   CALCIUM 9.2  12/01/2016   PROT 7.6 12/01/2016   ALBUMIN 3.0 (L) 12/01/2016   AST 16 12/01/2016   ALT 8 (L) 12/01/2016   ALKPHOS 60 12/01/2016   BILITOT 0.4 12/01/2016   GFRNONAA >60 12/01/2016   GFRAA >60 12/01/2016    Lab Results  Component Value Date   WBC 7.9 12/01/2016   NEUTROABS 6.2 12/01/2016   HGB 9.1 (L) 12/01/2016   HCT 27.6 (L) 12/01/2016   MCV 89.0 12/01/2016   PLT 237 12/01/2016     STUDIES: Nm Pet Image Restag (ps) Skull Base To Thigh  Result Date: 11/29/2016 CLINICAL DATA:  Subsequent treatment strategy for breast cancer. EXAM: NUCLEAR MEDICINE PET SKULL BASE TO THIGH TECHNIQUE: 13.03 mCi F-18 FDG was injected intravenously. Full-ring PET imaging was performed from the skull base to thigh after the radiotracer. CT data  was obtained and used for attenuation correction and anatomic localization. FASTING BLOOD GLUCOSE:  Value: 162 mg/dl COMPARISON:  PET-CT 06/26/2013. FINDINGS: NECK There are some soft tissue implants in the subcutaneous fat of the right posterior neck which are hypermetabolic. The largest of these (axial image 27 of series 3) measures 3.2 x 1.4 cm (SUVmax = 10.9). CHEST Large amount of amorphous soft tissue around the right shoulder joint, predominantly involving the deltoid musculature, diffusely hypermetabolic (SUVmax = 5.9). Mixed calcified and soft tissue attenuation mass in the right axilla measuring 3.2 x 7.0 cm (axial image 76 of series 3) is hypermetabolic (SUVmax = 5.9). Focal area of soft tissue thickening in the lateral aspect of the right breast (potentially at a lumpectomy scar site) on axial image 92 of series 3 demonstrates diffuse hypermetabolism (SUVmax = 6.5). No hypermetabolic mediastinal or hilar nodes. No suspicious pulmonary nodules on the CT scan. Heart size is normal. Small amount of pericardial fluid and/or thickening, unlikely to be of any hemodynamic significance at this time. There is aortic atherosclerosis, as well as atherosclerosis of the great  vessels of the mediastinum and the coronary arteries, including calcified atherosclerotic plaque in the left anterior descending, left circumflex and right coronary arteries. Calcifications of the aortic valve and mitral annulus. Left internal jugular single-lumen porta cath with tip terminating in the mid superior vena cava. ABDOMEN/PELVIS Multiple areas of hypermetabolism are noted within the liver, compatible with metastatic disease. The largest of these is centered in segment 4A (SUVmax = 7.9). These lesions correspond to very subtle areas of hypoattenuation in the liver. Large partially calcified mass arising from the inferior aspect of the left adrenal gland, extending toward the left renal hilum where is intimately associated with multiple vessels. This mass which is favored to represent an adrenal metastasis measures 3.7 x 4.9 cm (axial image 142 of series 3) and is diffusely hypermetabolic (SUVmax = 78.5). No abnormal hypermetabolic activity within the pancreas, right adrenal gland, or spleen. Mildly enlarged hepatoduodenal ligament lymph node (axial image 142 of series 3) measuring 15 mm in short axis is hypermetabolic (SUVmax = 4.6). Enlarged left inguinal lymph node measuring 2.5 x 2.4 cm which is hypermetabolic (SUVmax = 88.5)OY image 236 of series 3. SKELETON There are multiple sites of skeletal hypermetabolism, most evident in the thoracic and lumbar spine, and in the pelvis, compatible with metastatic disease to the bones. One example of this is a 13 mm lucent lesion in the left ilium above the left acetabulum (axial image 221 of series 3) which is hypermetabolic (SUVmax = 6.2). IMPRESSION: 1. Widespread metastatic disease in the soft tissues, skeleton, liver, left adrenal gland and lymph nodes, as detailed above. Findings appear generally progressive compared to prior study 07/08/2016. 2. Aortic atherosclerosis, in addition to three-vessel coronary artery disease. 3. Additional incidental findings,  as above. Electronically Signed   By: Vinnie Langton M.D.   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 PET scan results reviewed independently and reported as above with widespread metastatic disease that appear progressive. Patient has been instructed to reinitiate Abemaclib now that her acute renal failure has resolved. Return to clinic as previously scheduled on January 06, 2017 for further evaluation and continuation of Faslodex   #  Acute renal failure-resolved. Continue follow-up with nephrology as indicated.   #  hypokalemia- continue potassium once a day.    # Pain control- right chest wall pain shoulder pain secondary to malignancy. Continue hydrocodone one every 8 hours as needed.   # PE- Moderate- based on CT imaging incidental. Patient is asymptomatic. Patient is on Eliquis.    Patient expressed understanding and was in agreement with this plan. She also understands that She can call clinic at any time with any questions, concerns, or complaints.    Lloyd Huger, MD   12/09/2016 7:35 AM

## 2016-12-01 NOTE — Progress Notes (Signed)
Patient here today for follow up.  Patient states no new concerns today  

## 2016-12-02 LAB — CEA: CEA: 61.9 ng/mL — ABNORMAL HIGH (ref 0.0–4.7)

## 2016-12-02 LAB — CANCER ANTIGEN 27.29: CA 27.29: 25.6 U/mL (ref 0.0–38.6)

## 2016-12-08 ENCOUNTER — Telehealth: Payer: Self-pay | Admitting: *Deleted

## 2016-12-08 NOTE — Telephone Encounter (Signed)
I called patient and she said she was not home for appt because they were 3 hours late. She will call office today to arrange another appt

## 2016-12-08 NOTE — Telephone Encounter (Signed)
PT has been unable to reach patient and if she cannot get in touch with her today, she is cancelling the home PT order.

## 2016-12-22 ENCOUNTER — Emergency Department: Payer: BC Managed Care – PPO

## 2016-12-22 ENCOUNTER — Encounter: Payer: Self-pay | Admitting: Emergency Medicine

## 2016-12-22 ENCOUNTER — Inpatient Hospital Stay
Admission: EM | Admit: 2016-12-22 | Discharge: 2016-12-25 | DRG: 872 | Payer: BC Managed Care – PPO | Attending: Internal Medicine | Admitting: Internal Medicine

## 2016-12-22 DIAGNOSIS — I959 Hypotension, unspecified: Secondary | ICD-10-CM | POA: Diagnosis present

## 2016-12-22 DIAGNOSIS — N179 Acute kidney failure, unspecified: Secondary | ICD-10-CM | POA: Diagnosis present

## 2016-12-22 DIAGNOSIS — C7951 Secondary malignant neoplasm of bone: Secondary | ICD-10-CM | POA: Diagnosis present

## 2016-12-22 DIAGNOSIS — A419 Sepsis, unspecified organism: Secondary | ICD-10-CM | POA: Diagnosis not present

## 2016-12-22 DIAGNOSIS — I10 Essential (primary) hypertension: Secondary | ICD-10-CM | POA: Diagnosis present

## 2016-12-22 DIAGNOSIS — C792 Secondary malignant neoplasm of skin: Secondary | ICD-10-CM | POA: Diagnosis present

## 2016-12-22 DIAGNOSIS — G4730 Sleep apnea, unspecified: Secondary | ICD-10-CM | POA: Diagnosis present

## 2016-12-22 DIAGNOSIS — C50911 Malignant neoplasm of unspecified site of right female breast: Secondary | ICD-10-CM | POA: Diagnosis present

## 2016-12-22 DIAGNOSIS — M869 Osteomyelitis, unspecified: Secondary | ICD-10-CM

## 2016-12-22 DIAGNOSIS — L598 Other specified disorders of the skin and subcutaneous tissue related to radiation: Secondary | ICD-10-CM | POA: Diagnosis present

## 2016-12-22 DIAGNOSIS — Z87891 Personal history of nicotine dependence: Secondary | ICD-10-CM

## 2016-12-22 DIAGNOSIS — Z7901 Long term (current) use of anticoagulants: Secondary | ICD-10-CM

## 2016-12-22 DIAGNOSIS — C787 Secondary malignant neoplasm of liver and intrahepatic bile duct: Secondary | ICD-10-CM | POA: Diagnosis present

## 2016-12-22 DIAGNOSIS — S61401A Unspecified open wound of right hand, initial encounter: Secondary | ICD-10-CM

## 2016-12-22 DIAGNOSIS — I252 Old myocardial infarction: Secondary | ICD-10-CM

## 2016-12-22 DIAGNOSIS — Z955 Presence of coronary angioplasty implant and graft: Secondary | ICD-10-CM

## 2016-12-22 DIAGNOSIS — D649 Anemia, unspecified: Secondary | ICD-10-CM | POA: Diagnosis present

## 2016-12-22 DIAGNOSIS — Z86711 Personal history of pulmonary embolism: Secondary | ICD-10-CM

## 2016-12-22 DIAGNOSIS — C772 Secondary and unspecified malignant neoplasm of intra-abdominal lymph nodes: Secondary | ICD-10-CM | POA: Diagnosis present

## 2016-12-22 DIAGNOSIS — I96 Gangrene, not elsewhere classified: Secondary | ICD-10-CM

## 2016-12-22 DIAGNOSIS — I890 Lymphedema, not elsewhere classified: Secondary | ICD-10-CM | POA: Diagnosis present

## 2016-12-22 DIAGNOSIS — R000 Tachycardia, unspecified: Secondary | ICD-10-CM | POA: Diagnosis present

## 2016-12-22 LAB — CBC WITH DIFFERENTIAL/PLATELET
BASOS ABS: 0.1 10*3/uL (ref 0–0.1)
Basophils Relative: 1 %
EOS PCT: 1 %
Eosinophils Absolute: 0 10*3/uL (ref 0–0.7)
HCT: 26.4 % — ABNORMAL LOW (ref 35.0–47.0)
Hemoglobin: 8.8 g/dL — ABNORMAL LOW (ref 12.0–16.0)
Lymphocytes Relative: 16 %
Lymphs Abs: 0.7 10*3/uL — ABNORMAL LOW (ref 1.0–3.6)
MCH: 30.4 pg (ref 26.0–34.0)
MCHC: 33.2 g/dL (ref 32.0–36.0)
MCV: 91.7 fL (ref 80.0–100.0)
Monocytes Absolute: 0.2 10*3/uL (ref 0.2–0.9)
Monocytes Relative: 5 %
Neutro Abs: 3.6 10*3/uL (ref 1.4–6.5)
Neutrophils Relative %: 77 %
PLATELETS: 132 10*3/uL — AB (ref 150–440)
RBC: 2.88 MIL/uL — AB (ref 3.80–5.20)
RDW: 19.7 % — ABNORMAL HIGH (ref 11.5–14.5)
WBC: 4.6 10*3/uL (ref 3.6–11.0)

## 2016-12-22 LAB — COMPREHENSIVE METABOLIC PANEL
ALT: 25 U/L (ref 14–54)
AST: 31 U/L (ref 15–41)
Albumin: 2.8 g/dL — ABNORMAL LOW (ref 3.5–5.0)
Alkaline Phosphatase: 59 U/L (ref 38–126)
Anion gap: 11 (ref 5–15)
BUN: 14 mg/dL (ref 6–20)
CHLORIDE: 102 mmol/L (ref 101–111)
CO2: 22 mmol/L (ref 22–32)
Calcium: 9.1 mg/dL (ref 8.9–10.3)
Creatinine, Ser: 1.31 mg/dL — ABNORMAL HIGH (ref 0.44–1.00)
GFR calc Af Amer: 51 mL/min — ABNORMAL LOW (ref >60)
GFR, EST NON AFRICAN AMERICAN: 44 mL/min — AB (ref >60)
Glucose, Bld: 195 mg/dL — ABNORMAL HIGH (ref 65–99)
POTASSIUM: 3.5 mmol/L (ref 3.5–5.1)
Sodium: 135 mmol/L (ref 135–145)
Total Bilirubin: 0.5 mg/dL (ref 0.3–1.2)
Total Protein: 8 g/dL (ref 6.5–8.1)

## 2016-12-22 LAB — LACTIC ACID, PLASMA: LACTIC ACID, VENOUS: 1.7 mmol/L (ref 0.5–1.9)

## 2016-12-22 MED ORDER — VANCOMYCIN HCL IN DEXTROSE 1-5 GM/200ML-% IV SOLN
1000.0000 mg | Freq: Two times a day (BID) | INTRAVENOUS | Status: DC
  Filled 2016-12-22 (×2): qty 200

## 2016-12-22 MED ORDER — VANCOMYCIN HCL IN DEXTROSE 1-5 GM/200ML-% IV SOLN
1000.0000 mg | Freq: Once | INTRAVENOUS | Status: AC
  Administered 2016-12-22: 1000 mg via INTRAVENOUS
  Filled 2016-12-22: qty 200

## 2016-12-22 MED ORDER — PIPERACILLIN-TAZOBACTAM 4.5 G IVPB: 4.5000 g | Freq: Three times a day (TID) | INTRAVENOUS | Status: DC

## 2016-12-22 MED ORDER — PIPERACILLIN-TAZOBACTAM 3.375 G IVPB 30 MIN
3.3750 g | Freq: Once | INTRAVENOUS | Status: AC
  Administered 2016-12-22: 3.375 g via INTRAVENOUS
  Filled 2016-12-22 (×2): qty 50

## 2016-12-22 MED ORDER — SODIUM CHLORIDE 0.9 % IV BOLUS (SEPSIS)
1000.0000 mL | Freq: Once | INTRAVENOUS | Status: AC
  Administered 2016-12-22: 1000 mL via INTRAVENOUS

## 2016-12-22 NOTE — ED Triage Notes (Signed)
Pt to triage in wheelchair due to weakness. Pt currently being treated for breast cancer. Pt to ED today due to increased weakness, new wounds that are draining on the right arm and hand. Pt is tachycardic at 141bpm and afebrile at 100.43F in triage. Pt taken to RM5.

## 2016-12-22 NOTE — ED Provider Notes (Addendum)
[facility] Emergency Department Provider Note  ____________________________________________   I have reviewed the triage vital signs and the nursing notes.   HISTORY  Chief Complaint Weakness    HPI [name] is a 61 y.o. female who presents today with fever and pain in her hand. Patient has a long history of ulcerations, and infections in the right arm and has been septic from them in the past. She presents today however with hand lesions which appear been there for sometime acutely worse over the last several days. She has a nurse that goes to see her for these things and results the dressings on her wounds on her upper part of her right hand, which has been known functional she states since her cancer diagnosis early years ago. She cannot move that arm at baseline. She states however that she put a sock over her hand and did not let her wound care nurse see her hand even while the infection seemed to be getting worse. Today she elected to come in. She is still on chemotherapy. Fever started today she believes. No nausea no vomiting.    Past Medical History:  Diagnosis Date  . Anemia   . Anginal pain ([place])   . Breast cancer ([place])    2014   . Breast cancer metastasized to bone ([place]) 11/21/2014  . Hypertension   . Last menstrual period (LMP) > 10 days ago 2007  . Lymphedema of right upper extremity   . Myocardial infarction ([place])   . Post-menopausal 2006  . Sleep apnea   . Wound drainage     Patient Active Problem List   Diagnosis Date Noted  . Malignant neoplasm of female breast ([place])   . 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 Advance care planning   . Palliative care by specialist   . Sepsis ([place]) 10/29/2016  . Goals of care, counseling/discussion 10/03/2016  . Portacath in place 09/12/2016  . Carcinoma of overlapping sites of right breast in female, estrogen receptor positive ([place]) 04/11/2016  . Pulmonary emboli ([place]) 02/08/2016   . 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 Lymphedema of upper extremity 12/09/2015  . Last menstrual period (LMP) > 10 days ago   . Post-menopausal   . CAD in native artery 01/28/2015  . Diabetes mellitus, type 2 ([place]) 01/28/2015  . Obstructive apnea 01/28/2015  . Type 2 diabetes mellitus ([place]) 01/28/2015    Past Surgical History:  Procedure Laterality Date  . CORONARY ANGIOPLASTY WITH STENT PLACEMENT N/A   . HERNIA REPAIR     umbilical hernia repair  . PORTACATH PLACEMENT Right 02/03/2015   Procedure: INSERTION PORT-A-CATH;  Surgeon: [name], MD;  Location: [facility] ORS;  Service: General;  Laterality: Right;    Prior to Admission medications   Medication Sig Start Date End Date Taking? Authorizing Provider  acetaminophen (TYLENOL) 500 MG tablet Take 1,000 mg by mouth every 6 (six) hours as needed.    [provider]  [employer] CALCIUM 600+D 600-800 MG-UNIT TABS Take 1 tablet by mouth daily. At noon 10/03/14   [provider]  ELIQUIS 5 MG TABS tablet TAKE 1 TABLET BY MOUTH TWICE DAILY 10/13/16   Cammie Sickle, MD  feeding supplement, ENSURE ENLIVE, (ENSURE ENLIVE) LIQD Take 237 mLs by mouth 3 (three) times daily between meals. 10/31/16   Fritzi Mandes, MD  fexofenadine (ALLEGRA) 180 MG tablet Take 180 mg by mouth daily.     [provider]  furosemide (  LASIX) 20 MG tablet Take 20 mg by mouth daily.    [provider]  gabapentin (NEURONTIN) 300 MG capsule TAKE 1 CAPSULE BY MOUTH ONCE DAILY IN THE MORNING AND TAKE 2 CAPSULES BY MOUTH IN THE EVENING 09/06/16   Cammie Sickle, MD  HYDROcodone-acetaminophen (NORCO) 5-325 MG tablet Take 1 tablet by mouth every 6 (six) hours as needed for moderate pain (every 6 to 8 hrs prn pain). 11/04/16   Cammie Sickle, MD  levofloxacin (LEVAQUIN) 250 MG tablet Take 2 pills today; and then 1 pill every day 11/23/16   Cammie Sickle, MD  omega-3 acid ethyl esters (LOVAZA) 1 G capsule  Take 1 g by mouth at bedtime.    [provider]  ondansetron (ZOFRAN) 8 MG tablet TAKE 1 TABLET (8MG ) BY MOUTH TWICE DAILY START THE DAY AFTER CHEMOTHERAPY FOR 2 DAYS THEN TAKE AS NEEDED FOR NAUSEA OR VOMITING 12/21/15   Cammie Sickle, MD  phosphorus (PHOSPHA 250 NEUTRAL) [phone] MG tablet Take 1 tablet (250 mg total) by mouth 2 (two) times daily. 08/15/16   Cammie Sickle, MD  potassium chloride SA (K-DUR,KLOR-CON) 20 MEQ tablet Take 20 mEq by mouth daily.    [provider]  simvastatin (ZOCOR) 80 MG tablet Take 80 mg by mouth at bedtime.    [provider]    Allergies No known allergies  Family History  Problem Relation Age of Onset  . Atrial fibrillation Sister   . Hypertension Sister     Social History Social History  Substance Use Topics  . Smoking status: Former Smoker    Packs/day: 1.00    Types: Cigarettes    Quit date: 10/25/2004  . Smokeless tobacco: Never Used     Comment: stopped smoking greater than 7 years ago  . Alcohol use No    Review of Systems Constitutional: Positive fever/chills Eyes: No visual changes. ENT: No sore throat. No stiff neck no neck pain Cardiovascular: Denies chest pain. Respiratory: Denies shortness of breath. Gastrointestinal:   no vomiting.  No diarrhea.  No constipation. Genitourinary: Negative for dysuria. Musculoskeletal: Negative lower extremity swelling Skin: See above Neurological: Negative for severe headaches, focal weakness or numbness.   ____________________________________________   PHYSICAL EXAM:  VITAL SIGNS: ED Triage Vitals  Enc Vitals Group     BP 12/22/16 2141 130/85     Pulse Rate 12/22/16 2141 (!) 141     Resp 12/22/16 2141 16     Temp 12/22/16 2141 (!) 100.7 F (38.2 C)     Temp Source 12/22/16 2141 Oral     SpO2 12/22/16 2141 98 %     Weight 12/22/16 2142 287 lb (130.2 kg)     Height --      Head Circumference --      Peak Flow --      Pain Score --       Pain Loc --      Pain Edu? --      Excl. in [place]? --     Constitutional: Alert and oriented. Chronic ill appearing, in no distress at this time Eyes: Conjunctivae are normal Head: Atraumatic HEENT: No congestion/rhinnorhea. Mucous membranes are moist.  Oropharynx non-erythematous Neck:   Nontender with no meningismus, no masses, no stridor Cardiovascular: Normal rate, regular rhythm. Grossly normal heart sounds.  Good peripheral circulation. Respiratory: Normal respiratory effort.  No retractions. Lungs CTAB. Abdominal: Soft and nontender. No distention. No guarding no rebound Back:  There is no focal  tenderness or step off.  there is no midline tenderness there are no lesions noted. there is no CVA tenderness  Musculoskeletal: There is considerable atrophy in the right hand, there are very deep wounds neck showed necrotic and have an odor to them clean the fourth and fifth digits, there are multiple wounds of the arm with what appear to be cellulitic changes around them. Some of these also appear to have necrotic changes. The hand is warm at this time. No joint effusions, no DVT signs no edema Neurologic:  Normal speech and language. No gross focal neurologic deficits are appreciated aside from baseline right upper extremity weakness Skin:  Skin is warm, dry and intact. No rash noted. Psychiatric: Mood and affect are normal. Speech and behavior are normal.  ____________________________________________   LABS (all labs ordered are listed, but only abnormal results are displayed)  Labs Reviewed  CBC WITH DIFFERENTIAL/PLATELET - Abnormal; Notable for the following:       Result Value   RBC 2.88 (*)    Hemoglobin 8.8 (*)    HCT 26.4 (*)    RDW 19.7 (*)    Platelets 132 (*)    Lymphs Abs 0.7 (*)    All other components within normal limits  COMPREHENSIVE METABOLIC PANEL - Abnormal; Notable for the following:    Glucose, Bld 195 (*)    Creatinine, Ser 1.31 (*)    Albumin 2.8 (*)     GFR calc non Af Amer 44 (*)    GFR calc Af Amer 51 (*)    All other components within normal limits  CULTURE, BLOOD (ROUTINE X 2)  CULTURE, BLOOD (ROUTINE X 2)  URINE CULTURE  LACTIC ACID, PLASMA  URINALYSIS, COMPLETE (UACMP) WITH MICROSCOPIC  URINALYSIS, ROUTINE W REFLEX MICROSCOPIC   ____________________________________________  EKG  I personally interpreted any EKGs ordered by me or triage  ____________________________________________  RADIOLOGY  I reviewed any imaging ordered by me or triage that were performed during my shift and, if possible, patient and/or family made aware of any abnormal findings. ____________________________________________   PROCEDURES  Procedure(s) performed: None  Procedures  Critical Care performed: None  ____________________________________________   INITIAL IMPRESSION / ASSESSMENT AND PLAN / ED COURSE  Pertinent labs & imaging results that were available during my care of the patient were reviewed by me and considered in my medical decision making (see chart for details).  Patient with a deep necrotic wound and poor circulation to the right upper extremity of some duration, appears to be clinically that is been going on for weeks. We have ordered x-ray, blood work is reassuring white count reassuring lactic acid is reassuring however she is febrile and tachycardic here. We will treat her as possible sepsis although I don't think she meets criteria for huge boluses of fluids we'll give her 2 L which may approximate ideal weight. Patient may well have osteomyelitis in this hand. We're giving broad-spectrum antibiotics. Signed out to Dr. Owens Shark at the end of my shift.   No evidence at this time of anything suggesting an aggressive pathology such as necrotizing fasciitis or gas gangrene. Patient has had these symptoms for weeks and/or months and has been getting gradually to the point where she now is again infected. We'll discuss with the  hospitalist service Dr. Jannifer Franklin who agrees with management. I don't think emergent surgery is indicated tonight. Dopplerable pulses are noted in radial and ULNAR region.   ____________________________________________   FINAL CLINICAL IMPRESSION(S) / ED DIAGNOSES  Final diagnoses:  None      This chart was dictated using voice recognition software.  Despite best efforts to proofread,  errors can occur which can change meaning.      Schuyler Amor, MD 12/22/16 7897    Schuyler Amor, MD 12/22/16 [phone]

## 2016-12-22 NOTE — Progress Notes (Signed)
Pharmacy Antibiotic Note  [name] is a 61 y.o. female admitted on 12/22/2016 with cellulitis.  Pharmacy has been consulted for vancomycin and Zosyn dosing.  Plan: DW 86kg  Vd 60L kei 0.058 hr-1  T1/2 12 hours Vancomycin 1 gram q 12 hours ordered with stacked dosing. Level before 5th dose. Goal trough 15-20.  Zosyn 4.5 grams q 8 hours ordered for TBW >130 kg.  Weight: 287 lb (130.2 kg)  Temp (24hrs), Avg:100.2 F (37.9 C), Min:99.6 F (37.6 C), Max:100.7 F (38.2 C)   Recent Labs Lab 12/22/16 2215 12/22/16 2216  WBC 4.6  --   CREATININE 1.31*  --   LATICACIDVEN  --  1.7    Estimated Creatinine Clearance: 63.8 mL/min (A) (by C-G formula based on SCr of 1.31 mg/dL (H)).    Allergies  Allergen Reactions  . No Known Allergies     Antimicrobials this admission: Vancomycin Zosyn 6/28 >>    >>   Dose adjustments this admission:   Microbiology results: 6/28 BCx: pending 6/28 UCx: pending       6/28 UA: pending  Thank you for allowing pharmacy to be a part of this patient's care.  Briyan Kleven S 12/22/2016 11:31 PM

## 2016-12-23 DIAGNOSIS — C792 Secondary malignant neoplasm of skin: Secondary | ICD-10-CM | POA: Diagnosis present

## 2016-12-23 DIAGNOSIS — L598 Other specified disorders of the skin and subcutaneous tissue related to radiation: Secondary | ICD-10-CM | POA: Diagnosis present

## 2016-12-23 DIAGNOSIS — Z955 Presence of coronary angioplasty implant and graft: Secondary | ICD-10-CM | POA: Diagnosis not present

## 2016-12-23 DIAGNOSIS — C7951 Secondary malignant neoplasm of bone: Secondary | ICD-10-CM | POA: Diagnosis present

## 2016-12-23 DIAGNOSIS — I252 Old myocardial infarction: Secondary | ICD-10-CM | POA: Diagnosis not present

## 2016-12-23 DIAGNOSIS — I890 Lymphedema, not elsewhere classified: Secondary | ICD-10-CM | POA: Diagnosis present

## 2016-12-23 DIAGNOSIS — C787 Secondary malignant neoplasm of liver and intrahepatic bile duct: Secondary | ICD-10-CM | POA: Diagnosis present

## 2016-12-23 DIAGNOSIS — I96 Gangrene, not elsewhere classified: Secondary | ICD-10-CM | POA: Diagnosis not present

## 2016-12-23 DIAGNOSIS — C50911 Malignant neoplasm of unspecified site of right female breast: Secondary | ICD-10-CM | POA: Diagnosis present

## 2016-12-23 DIAGNOSIS — N179 Acute kidney failure, unspecified: Secondary | ICD-10-CM | POA: Diagnosis present

## 2016-12-23 DIAGNOSIS — S61401A Unspecified open wound of right hand, initial encounter: Secondary | ICD-10-CM | POA: Diagnosis not present

## 2016-12-23 DIAGNOSIS — A419 Sepsis, unspecified organism: Secondary | ICD-10-CM | POA: Diagnosis present

## 2016-12-23 DIAGNOSIS — Z87891 Personal history of nicotine dependence: Secondary | ICD-10-CM | POA: Diagnosis not present

## 2016-12-23 DIAGNOSIS — M869 Osteomyelitis, unspecified: Secondary | ICD-10-CM | POA: Diagnosis present

## 2016-12-23 DIAGNOSIS — I959 Hypotension, unspecified: Secondary | ICD-10-CM | POA: Diagnosis present

## 2016-12-23 DIAGNOSIS — G4730 Sleep apnea, unspecified: Secondary | ICD-10-CM | POA: Diagnosis present

## 2016-12-23 DIAGNOSIS — R000 Tachycardia, unspecified: Secondary | ICD-10-CM | POA: Diagnosis present

## 2016-12-23 DIAGNOSIS — Z7901 Long term (current) use of anticoagulants: Secondary | ICD-10-CM | POA: Diagnosis not present

## 2016-12-23 DIAGNOSIS — Z86711 Personal history of pulmonary embolism: Secondary | ICD-10-CM | POA: Diagnosis not present

## 2016-12-23 DIAGNOSIS — C772 Secondary and unspecified malignant neoplasm of intra-abdominal lymph nodes: Secondary | ICD-10-CM | POA: Diagnosis present

## 2016-12-23 DIAGNOSIS — I10 Essential (primary) hypertension: Secondary | ICD-10-CM | POA: Diagnosis present

## 2016-12-23 DIAGNOSIS — D649 Anemia, unspecified: Secondary | ICD-10-CM | POA: Diagnosis present

## 2016-12-23 LAB — URINALYSIS, COMPLETE (UACMP) WITH MICROSCOPIC
Bilirubin Urine: NEGATIVE
Glucose, UA: NEGATIVE mg/dL
Hgb urine dipstick: NEGATIVE
KETONES UR: NEGATIVE mg/dL
Leukocytes, UA: NEGATIVE
Nitrite: NEGATIVE
PH: 5 (ref 5.0–8.0)
Protein, ur: 30 mg/dL — AB
SPECIFIC GRAVITY, URINE: 1.02 (ref 1.005–1.030)

## 2016-12-23 LAB — TSH: TSH: 3.9 u[IU]/mL (ref 0.350–4.500)

## 2016-12-23 MED ORDER — PIPERACILLIN-TAZOBACTAM 3.375 G IVPB
3.3750 g | Freq: Three times a day (TID) | INTRAVENOUS | Status: DC
Start: 2016-12-23 — End: 2016-12-25
  Administered 2016-12-23 – 2016-12-25 (×6): 3.375 g via INTRAVENOUS
  Filled 2016-12-23 (×10): qty 50

## 2016-12-23 MED ORDER — VANCOMYCIN HCL IN DEXTROSE 1-5 GM/200ML-% IV SOLN
1000.0000 mg | Freq: Two times a day (BID) | INTRAVENOUS | Status: DC
  Administered 2016-12-23 – 2016-12-24 (×3): 1000 mg via INTRAVENOUS
  Filled 2016-12-23 (×4): qty 200

## 2016-12-23 MED ORDER — ONDANSETRON HCL 4 MG PO TABS
4.0000 mg | ORAL_TABLET | Freq: Four times a day (QID) | ORAL | Status: DC | PRN
  Filled 2016-12-23: qty 1

## 2016-12-23 MED ORDER — ABEMACICLIB 150 MG PO TABS
150.0000 mg | ORAL_TABLET | Freq: Two times a day (BID) | ORAL | Status: DC
  Administered 2016-12-24 – 2016-12-25 (×3): 150 mg via ORAL
  Filled 2016-12-23 (×4): qty 1

## 2016-12-23 MED ORDER — SODIUM CHLORIDE 0.9 % IV SOLN
INTRAVENOUS | Status: DC
  Administered 2016-12-23 – 2016-12-25 (×8): via INTRAVENOUS

## 2016-12-23 MED ORDER — HYDROCODONE-ACETAMINOPHEN 5-325 MG PO TABS
1.0000 | ORAL_TABLET | Freq: Four times a day (QID) | ORAL | Status: DC | PRN
  Administered 2016-12-23 – 2016-12-25 (×6): 1 via ORAL
  Filled 2016-12-23 (×7): qty 1

## 2016-12-23 MED ORDER — CALCIUM CARB-CHOLECALCIFEROL 600-800 MG-UNIT PO TABS: 1.0000 | ORAL_TABLET | Freq: Every day | ORAL | Status: DC

## 2016-12-23 MED ORDER — HYDROCODONE-ACETAMINOPHEN 5-325 MG PO TABS
2.0000 | ORAL_TABLET | Freq: Once | ORAL | Status: AC
Start: 2016-12-23 — End: 2016-12-23
  Administered 2016-12-23: 2 via ORAL
  Filled 2016-12-23: qty 2

## 2016-12-23 MED ORDER — JUVEN PO PACK
1.0000 | PACK | Freq: Two times a day (BID) | ORAL | Status: DC
  Administered 2016-12-23 – 2016-12-25 (×4): 1 via ORAL

## 2016-12-23 MED ORDER — ENSURE ENLIVE PO LIQD
237.0000 mL | Freq: Three times a day (TID) | ORAL | Status: DC
  Administered 2016-12-23 – 2016-12-25 (×4): 237 mL via ORAL

## 2016-12-23 MED ORDER — OMEGA-3-ACID ETHYL ESTERS 1 G PO CAPS
1.0000 g | ORAL_CAPSULE | Freq: Every day | ORAL | Status: DC
  Administered 2016-12-23 – 2016-12-24 (×2): 1 g via ORAL
  Filled 2016-12-23 (×2): qty 1

## 2016-12-23 MED ORDER — CALCIUM CARBONATE-VITAMIN D 500-200 MG-UNIT PO TABS
1.0000 | ORAL_TABLET | Freq: Every day | ORAL | Status: DC
  Administered 2016-12-23 – 2016-12-25 (×3): 1 via ORAL
  Filled 2016-12-23 (×3): qty 1

## 2016-12-23 MED ORDER — ACETAMINOPHEN 325 MG PO TABS: 650.0000 mg | ORAL_TABLET | Freq: Four times a day (QID) | ORAL | Status: DC | PRN

## 2016-12-23 MED ORDER — LOPERAMIDE HCL 2 MG PO CAPS
2.0000 mg | ORAL_CAPSULE | ORAL | Status: DC | PRN
  Administered 2016-12-23: 12:00:00 2 mg via ORAL
  Filled 2016-12-23 (×2): qty 1

## 2016-12-23 MED ORDER — POTASSIUM CHLORIDE CRYS ER 20 MEQ PO TBCR
20.0000 meq | EXTENDED_RELEASE_TABLET | Freq: Two times a day (BID) | ORAL | Status: DC
  Administered 2016-12-23 – 2016-12-25 (×5): 20 meq via ORAL
  Filled 2016-12-23 (×5): qty 1

## 2016-12-23 MED ORDER — MORPHINE SULFATE (PF) 2 MG/ML IV SOLN: 2.0000 mg | Freq: Every day | INTRAVENOUS | Status: DC

## 2016-12-23 MED ORDER — ALPRAZOLAM 0.5 MG PO TABS
0.2500 mg | ORAL_TABLET | Freq: Once | ORAL | Status: AC
  Administered 2016-12-23: 0.25 mg via ORAL
  Filled 2016-12-23: qty 1

## 2016-12-23 MED ORDER — ATORVASTATIN CALCIUM 20 MG PO TABS
40.0000 mg | ORAL_TABLET | Freq: Every day | ORAL | Status: DC
  Administered 2016-12-23 – 2016-12-24 (×2): 40 mg via ORAL
  Filled 2016-12-23 (×2): qty 2

## 2016-12-23 MED ORDER — ALPRAZOLAM 0.5 MG PO TABS: 0.2500 mg | ORAL_TABLET | Freq: Once | ORAL | Status: DC

## 2016-12-23 MED ORDER — LORATADINE 10 MG PO TABS
10.0000 mg | ORAL_TABLET | Freq: Every day | ORAL | Status: DC
  Administered 2016-12-23 – 2016-12-25 (×3): 10 mg via ORAL
  Filled 2016-12-23 (×3): qty 1

## 2016-12-23 MED ORDER — DAKINS (1/4 STRENGTH) 0.125 % EX SOLN
Freq: Every day | CUTANEOUS | Status: DC
  Administered 2016-12-23 – 2016-12-24 (×2)
  Filled 2016-12-23: qty 473

## 2016-12-23 MED ORDER — MORPHINE SULFATE (PF) 2 MG/ML IV SOLN
2.0000 mg | Freq: Every day | INTRAVENOUS | Status: DC | PRN
  Administered 2016-12-24: 2 mg via INTRAVENOUS
  Filled 2016-12-23: qty 1

## 2016-12-23 MED ORDER — K PHOS MONO-SOD PHOS DI & MONO 155-852-130 MG PO TABS
250.0000 mg | ORAL_TABLET | Freq: Two times a day (BID) | ORAL | Status: DC
  Administered 2016-12-23 (×2): 250 mg via ORAL
  Administered 2016-12-24: 22:00:00 via ORAL
  Administered 2016-12-24 – 2016-12-25 (×2): 250 mg via ORAL
  Filled 2016-12-23 (×6): qty 1

## 2016-12-23 MED ORDER — ACETAMINOPHEN 650 MG RE SUPP: 650.0000 mg | Freq: Four times a day (QID) | RECTAL | Status: DC | PRN

## 2016-12-23 MED ORDER — ONDANSETRON HCL 4 MG/2ML IJ SOLN: 4.0000 mg | Freq: Four times a day (QID) | INTRAMUSCULAR | Status: DC | PRN

## 2016-12-23 MED ORDER — DOCUSATE SODIUM 100 MG PO CAPS
100.0000 mg | ORAL_CAPSULE | Freq: Two times a day (BID) | ORAL | Status: DC
  Administered 2016-12-24 – 2016-12-25 (×2): 100 mg via ORAL
  Filled 2016-12-23 (×5): qty 1

## 2016-12-23 MED ORDER — APIXABAN 5 MG PO TABS
5.0000 mg | ORAL_TABLET | Freq: Two times a day (BID) | ORAL | Status: DC
  Administered 2016-12-23 – 2016-12-25 (×5): 5 mg via ORAL
  Filled 2016-12-23 (×5): qty 1

## 2016-12-23 NOTE — Consult Note (Signed)
[facility] Nurse wound follow up Due to foul, necrotic odor, will alter wound care orders to include Dakin's moist gauze for odor management.  Will not follow at this time.  Please re-consult if needed.  Domenic Moras RN BSN [place] Pager [phone]

## 2016-12-23 NOTE — Consult Note (Signed)
[place] Nurse wound consult note Reason for Consult:Known TO WOC TEAM Chronic, non-healing full thickness lesions related to her Stage 4 breast cancer.  Wounds present in the right axilla, right arm and hand and right upper back.  Wound type: neoplastic Pressure Injury POA: No Measurement: Right hand:  Dorsal hand:  8 cm x 4.5 cm 100% moist devitalized tissue.  Right arm:  2.5cm round with yellow slough obscuring wound bed.  Right axilla two lesions:  Most posterior measures 2cm x 6cm x 0.1cm with red, moist tissue; serosanguinous exudate noted. Most anterior measures 3cm x 2.5cm x 0.2cm with moist red wound bed and scant serosanguinous exudate present Right upper back lesions: several lesions, the largest of which measures 2cm x 3cm x 0.1cm. Scant bleeding from moist red incision. Right upper arm:  Scabbed neoplasm measuring 1.6cm round with elevation to 0.5cm.  No surrounding erythema, induration or warmth. Wound bed: As described above. Drainage (amount, consistency, odor) As described above.  Wounds with musty, necrotic odor.  Right hand with acute decline.  Systemic antibiotics being given at this time.  Periwound: Intact, dry. Dressing procedure/placement/frequency:  Cleanse wounds to right hand, arm, right axilla and right scapula with soap and water and pat gently dry.  Apply calcium alginate dressing for absorption and cover with ABD pads, kerlix and / or tape as able due to location.  Change daily.  Will not follow at this time.  Please re-consult if needed.  Domenic Moras RN BSN [place] Pager [phone]

## 2016-12-23 NOTE — Progress Notes (Signed)
[facility] [place] NAME: [name]    MR#:  [id number]  DATE OF BIRTH:  1955-11-23  SUBJECTIVE:  CHIEF COMPLAINT:   Chief Complaint  Patient presents with  . Weakness  Hypotensive, feeling weak, husband at bedside REVIEW OF SYSTEMS:  Review of Systems  Constitutional: Positive for malaise/fatigue. Negative for chills, fever and weight loss.  HENT: Negative for nosebleeds and sore throat.   Eyes: Negative for blurred vision.  Respiratory: Negative for cough, shortness of breath and wheezing.   Cardiovascular: Negative for chest pain, orthopnea, leg swelling and PND.  Gastrointestinal: Negative for abdominal pain, constipation, diarrhea, heartburn, nausea and vomiting.  Genitourinary: Negative for dysuria and urgency.  Musculoskeletal: Negative for back pain.  Skin: Positive for rash.  Neurological: Positive for weakness. Negative for dizziness, speech change, focal weakness and headaches.  Endo/Heme/Allergies: Does not bruise/bleed easily.  Psychiatric/Behavioral: Negative for depression.    DRUG ALLERGIES:   Allergies  Allergen Reactions  . No Known Allergies    VITALS:  Blood pressure (!) 101/54, pulse 90, temperature 97.9 F (36.6 C), temperature source Oral, resp. rate 16, weight 130.2 kg (287 lb), SpO2 100 %. PHYSICAL EXAMINATION:  Physical Exam  Skin: Lesion noted.  considerable atrophy in the right hand, there are very deep wounds in neck showing necrotic areas and have an odor to them clean the fourth and fifth digits, there are multiple wounds of the arm with what appear to be cellulitic changes around them. Some of these also appear to have necrotic changes. The hand is warm at this time.   LABORATORY PANEL:  Female CBC  Recent Labs Lab 12/22/16 2215  WBC 4.6  HGB 8.8*  HCT 26.4*  PLT 132*    ------------------------------------------------------------------------------------------------------------------ Chemistries   Recent Labs Lab 12/22/16 2215  NA 135  K 3.5  CL 102  CO2 22  GLUCOSE 195*  BUN 14  CREATININE 1.31*  CALCIUM 9.1  AST 31  ALT 25  ALKPHOS 59  BILITOT 0.5   RADIOLOGY:  Dg Hand Complete Right  Result Date: 12/22/2016 CLINICAL DATA:  Draining wounds to the right hand. EXAM: RIGHT HAND - COMPLETE 3+ VIEW COMPARISON:  None. FINDINGS: Negative for acute fracture. No dislocation. Poor bone density, decreasing sensitivity for intrinsic bony abnormality. There is irregularity at the ulnar aspect of the fourth proximal phalanx distally. This could represent osteolysis associated with infection. No soft tissue foreign body. IMPRESSION: Irregularity at the ulnar distal aspect of the fourth proximal phalanx, poorly seen due to bony demineralization. This could represent osteomyelitis in the appropriate clinical setting. No fracture or dislocation. Electronically Signed   By: Andreas Newport M.D.   On: 12/22/2016 23:19   ASSESSMENT AND PLAN:  This is a 61 year old female admitted for sepsis.  1. Sepsis: Present on admission likely due to wound infection. She was admitted for the same diagnosis approximately 7 weeks ago and had a poor prognosis at that time as her arm and chest wall. - continue broad spectrum antibiotic coverage. She is hemodynamically stable. - Wound care nurse consult  2. Breast cancer: Stage IV. Continue chemotherapy once dosing confirmed by oncology Center  3. Acute kidney injury: Hydrate with intravenous fluid.  Likely prerenal Avoid nephrotoxic agents.  4. Hypertension: diet controlled  5. DVT prophylaxis: Eliquis  6. GI prophylaxis: None     All the records are reviewed and case discussed with Care Management/Social Worker. Management plans discussed with the patient, family and  they are in agreement.  CODE STATUS: Full  Code  TOTAL TIME TAKING CARE OF THIS PATIENT: 35 minutes.   More than 50% of the time was spent in counseling/coordination of care: YES  POSSIBLE D/C IN 1-2 DAYS, DEPENDING ON CLINICAL CONDITION.   Max Sane M.D on 12/23/2016 at 10:28 AM  Between 7am to 6pm - Pager - [phone]  After 6pm go to [email or web address] - [employer] [facility]  Office  [phone]  CC: Primary care physician; Kirk Ruths, MD  Note: This dictation was prepared with Dragon dictation along with smaller phrase technology. Any transcriptional errors that result from this process are unintentional.

## 2016-12-23 NOTE — ED Notes (Signed)
Radial pulse on right hand able to be auscultated by Doppler; location marked by an "X" for future assessment.

## 2016-12-23 NOTE — H&P (Addendum)
[name] is an 61 y.o. female.   Chief Complaint: Wound drainage HPI: The patient with past medical history of metastatic breast cancer with deep wounds from radiation treated areas and fungating malignant masses presents to the emergency department due to increased drainage from her right hand. Her right arm and hand has been known to be nearly avascular with recurrent infections of open wounds but tonight the patient's hand wound drainage has been greater than usual. Sepsis protocol was initiated on file be febrile and have leukocytosis after which the emergency department staff called the hospitalist service for admission.  Past Medical History:  Diagnosis Date  . Anemia   . Anginal pain ([place])   . Breast cancer ([place])    2014   . Breast cancer metastasized to bone ([place]) 11/21/2014  . Hypertension   . Last menstrual period (LMP) > 10 days ago 2007  . Lymphedema of right upper extremity   . Myocardial infarction ([place])   . Post-menopausal 2006  . Sleep apnea   . Wound drainage     Past Surgical History:  Procedure Laterality Date  . CORONARY ANGIOPLASTY WITH STENT PLACEMENT N/A   . HERNIA REPAIR     umbilical hernia repair  . PORTACATH PLACEMENT Right 02/03/2015   Procedure: INSERTION PORT-A-CATH;  Surgeon: [name], MD;  Location: [facility] ORS;  Service: General;  Laterality: Right;    Family History  Problem Relation Age of Onset  . Atrial fibrillation Sister   . Hypertension Sister    Social History:  reports that she quit smoking about 12 years ago. Her smoking use included Cigarettes. She smoked 1.00 pack per day. She has never used smokeless tobacco. She reports that she does not drink alcohol or use drugs.  Allergies:  Allergies  Allergen Reactions  . No Known Allergies     Prior to Admission medications   Medication Sig Start Date End Date Taking? Authorizing Provider  Abemaciclib (VERZENIO PO) Take 1 tablet by mouth 2 (two) times daily.   Yes [provider]  [employer] CALCIUM 600+D 600-800 MG-UNIT TABS Take 1 tablet by mouth daily. At noon 10/03/14  Yes [provider]  ELIQUIS 5 MG TABS tablet TAKE 1 TABLET BY MOUTH TWICE DAILY 10/13/16  Yes Cammie Sickle, MD  feeding supplement, ENSURE ENLIVE, (ENSURE ENLIVE) LIQD Take 237 mLs by mouth 3 (three) times daily between meals. 10/31/16  Yes Fritzi Mandes, MD  fexofenadine (ALLEGRA) 180 MG tablet Take 180 mg by mouth daily.    Yes [provider]  HYDROcodone-acetaminophen (NORCO) 5-325 MG tablet Take 1 tablet by mouth every 6 (six) hours as needed for moderate pain (every 6 to 8 hrs prn pain). 11/04/16  Yes Cammie Sickle, MD  loperamide (IMODIUM) 2 MG capsule Take 2 mg by mouth as needed for diarrhea or loose stools.   Yes [provider]  omega-3 acid ethyl esters (LOVAZA) 1 G capsule Take 1 g by mouth at bedtime.   Yes [provider]  ondansetron (ZOFRAN) 8 MG tablet TAKE 1 TABLET (8MG) BY MOUTH TWICE DAILY START THE DAY AFTER CHEMOTHERAPY FOR 2 DAYS THEN TAKE AS NEEDED FOR NAUSEA OR VOMITING 12/21/15  Yes Cammie Sickle, MD  phosphorus (PHOSPHA 250 NEUTRAL) [phone] MG tablet Take 1 tablet (250 mg total) by mouth 2 (two) times daily. 08/15/16  Yes Cammie Sickle, MD  potassium chloride SA (K-DUR,KLOR-CON) 20 MEQ tablet Take 20 mEq by mouth 2 (two) times daily.  Yes [provider]  simvastatin (ZOCOR) 80 MG tablet Take 80 mg by mouth at bedtime.   Yes [provider]     Results for orders placed or performed during the hospital encounter of 12/22/16 (from the past 48 hour(s))  CBC with Differential     Status: Abnormal   Collection Time: 12/22/16 10:15 PM  Result Value Ref Range   WBC 4.6 3.6 - 11.0 K/uL   RBC 2.88 (L) 3.80 - 5.20 MIL/uL   Hemoglobin 8.8 (L) 12.0 - 16.0 g/dL   HCT 26.4 (L) 35.0 - 47.0 %   MCV 91.7 80.0 - 100.0 fL   MCH 30.4 26.0 - 34.0 pg   MCHC 33.2 32.0 - 36.0 g/dL   RDW 19.7 (H) 11.5 -  14.5 %   Platelets 132 (L) 150 - 440 K/uL   Neutrophils Relative % 77 %   Neutro Abs 3.6 1.4 - 6.5 K/uL   Lymphocytes Relative 16 %   Lymphs Abs 0.7 (L) 1.0 - 3.6 K/uL   Monocytes Relative 5 %   Monocytes Absolute 0.2 0.2 - 0.9 K/uL   Eosinophils Relative 1 %   Eosinophils Absolute 0.0 0 - 0.7 K/uL   Basophils Relative 1 %   Basophils Absolute 0.1 0 - 0.1 K/uL  Comprehensive metabolic panel     Status: Abnormal   Collection Time: 12/22/16 10:15 PM  Result Value Ref Range   Sodium 135 135 - 145 mmol/L   Potassium 3.5 3.5 - 5.1 mmol/L   Chloride 102 101 - 111 mmol/L   CO2 22 22 - 32 mmol/L   Glucose, Bld 195 (H) 65 - 99 mg/dL   BUN 14 6 - 20 mg/dL   Creatinine, Ser 1.31 (H) 0.44 - 1.00 mg/dL   Calcium 9.1 8.9 - 10.3 mg/dL   Total Protein 8.0 6.5 - 8.1 g/dL   Albumin 2.8 (L) 3.5 - 5.0 g/dL   AST 31 15 - 41 U/L   ALT 25 14 - 54 U/L   Alkaline Phosphatase 59 38 - 126 U/L   Total Bilirubin 0.5 0.3 - 1.2 mg/dL   GFR calc non Af Amer 44 (L) >60 mL/min   GFR calc Af Amer 51 (L) >60 mL/min    Comment: (NOTE) The eGFR has been calculated using the CKD EPI equation. This calculation has not been validated in all clinical situations. eGFR's persistently <60 mL/min signify possible Chronic Kidney Disease.    Anion gap 11 5 - 15  Culture, blood (routine x 2)     Status: None (Preliminary result)   Collection Time: 12/22/16 10:16 PM  Result Value Ref Range   Specimen Description BLOOD PORTA CATH    Special Requests      BOTTLES DRAWN AEROBIC AND ANAEROBIC Blood Culture adequate volume   Culture NO GROWTH < 12 HOURS    Report Status PENDING   Lactic acid, plasma     Status: None   Collection Time: 12/22/16 10:16 PM  Result Value Ref Range   Lactic Acid, Venous 1.7 0.5 - 1.9 mmol/L  Culture, blood (routine x 2)     Status: None (Preliminary result)   Collection Time: 12/22/16 11:13 PM  Result Value Ref Range   Specimen Description BLOOD BLOOD LEFT HAND    Special Requests       BOTTLES DRAWN AEROBIC AND ANAEROBIC Blood Culture adequate volume   Culture NO GROWTH < 12 HOURS    Report Status PENDING   Urinalysis, Complete w Microscopic  Status: Abnormal   Collection Time: 12/23/16 12:02 AM  Result Value Ref Range   Color, Urine YELLOW (A) YELLOW   APPearance HAZY (A) CLEAR   Specific Gravity, Urine 1.020 1.005 - 1.030   pH 5.0 5.0 - 8.0   Glucose, UA NEGATIVE NEGATIVE mg/dL   Hgb urine dipstick NEGATIVE NEGATIVE   Bilirubin Urine NEGATIVE NEGATIVE   Ketones, ur NEGATIVE NEGATIVE mg/dL   Protein, ur 30 (A) NEGATIVE mg/dL   Nitrite NEGATIVE NEGATIVE   Leukocytes, UA NEGATIVE NEGATIVE   RBC / HPF 0-5 0 - 5 RBC/hpf   WBC, UA 0-5 0 - 5 WBC/hpf   Bacteria, UA RARE (A) NONE SEEN   Squamous Epithelial / LPF 0-5 (A) NONE SEEN   Mucous PRESENT    Hyaline Casts, UA PRESENT    Dg Hand Complete Right  Result Date: 12/22/2016 CLINICAL DATA:  Draining wounds to the right hand. EXAM: RIGHT HAND - COMPLETE 3+ VIEW COMPARISON:  None. FINDINGS: Negative for acute fracture. No dislocation. Poor bone density, decreasing sensitivity for intrinsic bony abnormality. There is irregularity at the ulnar aspect of the fourth proximal phalanx distally. This could represent osteolysis associated with infection. No soft tissue foreign body. IMPRESSION: Irregularity at the ulnar distal aspect of the fourth proximal phalanx, poorly seen due to bony demineralization. This could represent osteomyelitis in the appropriate clinical setting. No fracture or dislocation. Electronically Signed   By: Andreas Newport M.D.   On: 12/22/2016 23:19    Review of Systems  Constitutional: Positive for fever. Negative for chills.  HENT: Negative for sore throat and tinnitus.   Eyes: Negative for blurred vision and redness.  Respiratory: Negative for cough and shortness of breath.   Cardiovascular: Negative for chest pain, palpitations, orthopnea and PND.  Gastrointestinal: Negative for abdominal pain,  diarrhea, nausea and vomiting.  Genitourinary: Negative for dysuria, frequency and urgency.  Musculoskeletal: Negative for joint pain and myalgias.  Skin: Negative for rash.       No lesions  Neurological: Negative for speech change, focal weakness and weakness.  Endo/Heme/Allergies: Does not bruise/bleed easily.       No temperature intolerance  Psychiatric/Behavioral: Negative for depression and suicidal ideas.    Blood pressure (!) 105/53, pulse 99, temperature 99.6 F (37.6 C), temperature source Oral, resp. rate 16, weight 130.2 kg (287 lb), SpO2 97 %. Physical Exam  Vitals reviewed. Constitutional: She is oriented to person, place, and time. She appears well-developed and well-nourished. No distress.  HENT:  Head: Normocephalic and atraumatic.  Mouth/Throat: Oropharynx is clear and moist.  Eyes: Conjunctivae and EOM are normal. Pupils are equal, round, and reactive to light.  Neck: Normal range of motion. Neck supple. No JVD present. No tracheal deviation present. No thyromegaly present.  Cardiovascular: Normal rate, regular rhythm and normal heart sounds.  Exam reveals no gallop and no friction rub.   No murmur heard. Respiratory: Effort normal and breath sounds normal.  GI: Soft. Bowel sounds are normal. She exhibits no distension. There is no tenderness.  Musculoskeletal: Normal range of motion.       Arms: Open wounds; some purulent, some dry  Lymphadenopathy:    She has no cervical adenopathy.  Neurological: She is alert and oriented to person, place, and time. She has normal reflexes. No cranial nerve deficit. She exhibits normal muscle tone.  Skin: Skin is warm and dry. No rash noted. No erythema.  Psychiatric: She has a normal mood and affect. Her behavior is normal. Judgment and  thought content normal.     Assessment/Plan This is a 61 year old female admitted for sepsis. 1. Sepsis: The patient meets criteria via fever and leukocytosis. She was admitted for the same  diagnosis approximately 7 weeks ago and had a poor prognosis at that time as her arm and chest wall were thought to be gangrenous, but the patient apparently has a pulse detected by Doppler in the right wrist. Thus will continue broad spectrum antibiotic coverage. She is hemodynamically stable. 2. Breast cancer: Stage IV. Continue chemotherapy once dosing confirmed by oncology Center 3. Acute kidney injury: Hydrate with intravenous fluid. Avoid nephrotoxic agents. 4. Hypertension: diet controlled 5. DVT prophylaxis: Eliquis 6. GI prophylaxis: None The patient is a full code. Time spent on admission orders and patient care approximately 45 minutes  Harrie Foreman, MD 12/23/2016, 8:34 AM

## 2016-12-23 NOTE — Progress Notes (Signed)
Pharmacy Antibiotic Note  [name] is a 61 y.o. female admitted on 12/22/2016 with cellulitis.  Pharmacy has been consulted for vancomycin and Zosyn dosing.  Plan: Dose not given in ED as ordered. Vancomycin rescheduled for 0930. Will continue vancomycin 1g IV Q12hr for goal trough of 15-20. Will obtain trough prior to evening dose on 6/30.   Continue Zosyn 3.375g IV Q8hr.   Height: 5\' 5"  (165.1 cm) Weight: 276 lb (125.2 kg) IBW/kg (Calculated) : 57  Temp (24hrs), Avg:98.7 F (37.1 C), Min:97.9 F (36.6 C), Max:99.6 F (37.6 C)   Recent Labs Lab 12/22/16 2215 12/22/16 2216  WBC 4.6  --   CREATININE 1.31*  --   LATICACIDVEN  --  1.7    Estimated Creatinine Clearance: 62.3 mL/min (A) (by C-G formula based on SCr of 1.31 mg/dL (H)).    Allergies  Allergen Reactions  . No Known Allergies     Antimicrobials this admission: Vancomycin 6/28 >> Zosyn 6/28 >>   Dose adjustments this admission:   Microbiology results: 6/28 BCx: no growth < 12 hours  6/28 UCx: pending   Thank you for allowing pharmacy to be a part of this patient's care.  [name] L 12/23/2016 10:42 PM

## 2016-12-23 NOTE — Progress Notes (Signed)
Initial Nutrition Assessment  DOCUMENTATION CODES:   Obesity unspecified  INTERVENTION:  Continue Ensure Enlive po TID, each supplement provides 350 kcal and 20 grams of protein.   Provide Juven po BID, each supplement provides 80 calories, 14 grams of amino acids, and other nutrients necessary for wound healing.  Encouraged patient to eat small, frequent meals throughout the day. Discussed calorie- and protein-dense options that will help her meet her calorie/protein needs.  NUTRITION DIAGNOSIS:   Inadequate oral intake related to poor appetite, cancer and cancer related treatments as evidenced by per patient/family report.  GOAL:   Patient will meet greater than or equal to 90% of their needs  MONITOR:   PO intake, Supplement acceptance, Labs, Weight trends, I & O's  REASON FOR ASSESSMENT:   Malnutrition Screening Tool    ASSESSMENT:   61 year old female with PMHx of HTN, hx of MI, stage IV breast cancer on chemo and s/p palliative XRT, lymphedema of right upper extremity, non-healing wounds, admitted with sepsis.    Spoke with patient and her husband at bedside. She reports her appetite is poor. She does not feel hungry. Denies any N/V, abdominal pain, difficulty chewing/swallowing, or constipation/diarrhea. She reports she really only eats one small meal per day. She may have a breakfast bar, 1/2 sandwich, or small piece of fish. Reports she drinks chocolate Boost 2-3 times per day. Cannot recall which type of Boost it is. Reports she has had her wounds for approximately 1 year now. Patient reports the handouts provided by a different RD on last admission were very helpful. Reviewed main points of handout and encouraged patient to try eating small, frequent meals to increase her intake. Encouraged her to choose Boost Plus or Boost High Protein for added calories and protein.  UBW was 367 lbs prior to starting treatment. Per chart patient was 356.1 lbs on 11/06/2014. She lost  down to 314.4 lbs on 11/18/2015. That is a weight loss of 41.7 lbs (11.7% body weight) over 1 year, which is not significant for time frame. Patient was 287.4 lbs on 12/01/2016, so she has now lost another 27 lbs (8.6% body weight) over one year, which is not significant for time frame. Unsure if current weight is accurate - will continue to monitor.  Medications reviewed and include: Oscal with D 1 tablet daily, Colace, Lovaza 1 gram daily, K Phos Neutral 250 mg BID, potassium chloride 20 mEq BID, NS @ 150 ml/hr, Zosyn, vancomycin.  Labs reviewed: EGFR 44, Platelets 132.   Nutrition-Focused physical exam completed. Findings are no fat depletion, no muscle depletion, and no edema.   Patient does not meet criteria for malnutrition at this time, but she is at risk for malnutrition.  Discussed with RN.   Diet Order:  Diet regular Room service appropriate? Yes; Fluid consistency: Thin  Skin:  Wound (see comment) (Chronic, non-healing wounds to right axilla, arm, hand, upper back)   Last BM:  12/23/2016 - small type 7  Height:   Ht Readings from Last 1 Encounters:  12/23/16 5' 5"  (1.651 m)    Weight:   Wt Readings from Last 1 Encounters:  12/23/16 276 lb (125.2 kg)    Ideal Body Weight:  56.8 kg  BMI:  Body mass index is 45.93 kg/m.  Estimated Nutritional Needs:   Kcal:  2200-2400 (MSJ x 1.2-1.4)  Protein:  115-140 grams (0.9-1.1 grams/kg ABW)  Fluid:  2.2-2.4 L/day  EDUCATION NEEDS:   Education needs addressed  Willey Blade, MS, RD,  [place] Pager: [phone] After Hours Pager: [phone]

## 2016-12-23 NOTE — ED Notes (Signed)
Pt c/o severe back pain from being in stretcher bed for so long. Pt informed of "Bed Assigned" admission status and upcoming transfer to inpatient unit very soon; pt will be placed in recliner chair for comfort until that time.

## 2016-12-24 DIAGNOSIS — I96 Gangrene, not elsewhere classified: Secondary | ICD-10-CM

## 2016-12-24 DIAGNOSIS — S61401A Unspecified open wound of right hand, initial encounter: Secondary | ICD-10-CM

## 2016-12-24 LAB — URINE CULTURE: Culture: <10000 — AB

## 2016-12-24 LAB — BASIC METABOLIC PANEL
Anion gap: 4 — ABNORMAL LOW (ref 5–15)
BUN: 12 mg/dL (ref 6–20)
CO2: 25 mmol/L (ref 22–32)
Calcium: 8.2 mg/dL — ABNORMAL LOW (ref 8.9–10.3)
Chloride: 106 mmol/L (ref 101–111)
Creatinine, Ser: 1.01 mg/dL — ABNORMAL HIGH (ref 0.44–1.00)
GFR calc Af Amer: >60 mL/min (ref >60)
GFR calc non Af Amer: >60 mL/min (ref >60)
Glucose, Bld: 159 mg/dL — ABNORMAL HIGH (ref 65–99)
Potassium: 3.6 mmol/L (ref 3.5–5.1)
Sodium: 135 mmol/L (ref 135–145)

## 2016-12-24 LAB — VANCOMYCIN, TROUGH: Vancomycin Tr: 20 ug/mL (ref 15–20)

## 2016-12-24 LAB — HEMOGLOBIN A1C
Hgb A1c MFr Bld: 6 % — ABNORMAL HIGH (ref 4.8–5.6)
Mean Plasma Glucose: 126 mg/dL

## 2016-12-24 MED ORDER — VANCOMYCIN HCL IN DEXTROSE 1-5 GM/200ML-% IV SOLN
1000.0000 mg | Freq: Two times a day (BID) | INTRAVENOUS | Status: DC
  Filled 2016-12-24 (×2): qty 200

## 2016-12-24 MED ORDER — SODIUM CHLORIDE 0.9 % IV SOLN
1250.0000 mg | Freq: Two times a day (BID) | INTRAVENOUS | Status: DC
  Filled 2016-12-24: qty 1250

## 2016-12-24 MED ORDER — VANCOMYCIN HCL IN DEXTROSE 1-5 GM/200ML-% IV SOLN
1000.0000 mg | INTRAVENOUS | Status: DC
  Administered 2016-12-24: 22:00:00 1000 mg via INTRAVENOUS
  Filled 2016-12-24 (×2): qty 200

## 2016-12-24 MED ORDER — ALPRAZOLAM 0.5 MG PO TABS
0.5000 mg | ORAL_TABLET | Freq: Three times a day (TID) | ORAL | Status: DC | PRN
Start: 2016-12-24 — End: 2016-12-25
  Administered 2016-12-24 – 2016-12-25 (×3): 0.5 mg via ORAL
  Filled 2016-12-24 (×3): qty 1

## 2016-12-24 MED ORDER — MORPHINE SULFATE (PF) 2 MG/ML IV SOLN
2.0000 mg | INTRAVENOUS | Status: DC | PRN
  Administered 2016-12-24 – 2016-12-25 (×3): 2 mg via INTRAVENOUS
  Filled 2016-12-24 (×3): qty 1

## 2016-12-24 NOTE — Progress Notes (Signed)
Eating well. Up to [facility] with 2+. Pt requested anxiety and pain med prior to wound care/dsgs changes- xanax and morphine given. Limited wound care/dsg change provided due to pain in passively moving right arm in abduction for wound/dsg change primarily on inner right elbow and right axilla/chest area. Large amount of drainage oozing from wounds in upper arm with dsgs applied and incontinent pads used to manage drainage.  Family very supportive/involved in pt care.

## 2016-12-24 NOTE — Consult Note (Signed)
Date of Consultation:  12/24/2016  Requesting Physician:  Dustin Flock, MD  Reason for Consultation:  Right upper extremity wounds  History of Present Illness: [name] is a 61 y.o. female with a history of metastatic right breast cancer, with mets to bone and liver, with lymphedema of the right arm and resultant multiple cutaneous wounds to the shoulder, arm, axilla, right chest, and more recently, to the right hand.  The patient cannot move her right arm on her own, and there is significant pain with passive motion.  She has lost sensation in her right hand as well.  She had been doing dressing changes with alginate over her wounds and overall they had been stable.  Now she's admitted with septic picture with fever and leukocytosis.  Her hand wounds have been having more drainage which has had foul odor.  There has been concern for necrosis and surgery was consulted for evaluation of her wounds and possible debridement.  She has been having pain in her right arm where the majority of her wounds are located.  Her fever has resolved but she remains intermittently tachycardic.  Per RN, the blood flow to her right hand is also significantly decreased, and is non-palpable but dopplerable.   Past Medical History: Past Medical History:  Diagnosis Date  . Anemia   . Anginal pain ([place])   . Breast cancer ([place])    2014   . Breast cancer metastasized to bone ([facility]) 11/21/2014  . Hypertension   . Last menstrual period (LMP) > 10 days ago 2007  . Lymphedema of right upper extremity   . Myocardial infarction ([place])   . Post-menopausal 2006  . Sleep apnea   . Wound drainage      Past Surgical History: Past Surgical History:  Procedure Laterality Date  . CORONARY ANGIOPLASTY WITH STENT PLACEMENT N/A   . HERNIA REPAIR     umbilical hernia repair  . PORTACATH PLACEMENT Right 02/03/2015   Procedure: INSERTION PORT-A-CATH;  Surgeon: [name], MD;  Location: [facility] ORS;  Service:  General;  Laterality: Right;    Home Medications: Prior to Admission medications   Medication Sig Start Date End Date Taking? Authorizing Provider  Abemaciclib (VERZENIO PO) Take 1 tablet by mouth 2 (two) times daily.   Yes [provider]  [employer] CALCIUM 600+D 600-800 MG-UNIT TABS Take 1 tablet by mouth daily. At noon 10/03/14  Yes [provider]  ELIQUIS 5 MG TABS tablet TAKE 1 TABLET BY MOUTH TWICE DAILY 10/13/16  Yes Cammie Sickle, MD  feeding supplement, ENSURE ENLIVE, (ENSURE ENLIVE) LIQD Take 237 mLs by mouth 3 (three) times daily between meals. 10/31/16  Yes Fritzi Mandes, MD  fexofenadine (ALLEGRA) 180 MG tablet Take 180 mg by mouth daily.    Yes [provider]  HYDROcodone-acetaminophen (NORCO) 5-325 MG tablet Take 1 tablet by mouth every 6 (six) hours as needed for moderate pain (every 6 to 8 hrs prn pain). 11/04/16  Yes Cammie Sickle, MD  loperamide (IMODIUM) 2 MG capsule Take 2 mg by mouth as needed for diarrhea or loose stools.   Yes [provider]  omega-3 acid ethyl esters (LOVAZA) 1 G capsule Take 1 g by mouth at bedtime.   Yes [provider]  ondansetron (ZOFRAN) 8 MG tablet TAKE 1 TABLET (8MG ) BY MOUTH TWICE DAILY START THE DAY AFTER CHEMOTHERAPY FOR 2 DAYS THEN TAKE AS NEEDED FOR NAUSEA OR VOMITING 12/21/15  Yes Cammie Sickle, MD  phosphorus (  PHOSPHA 250 NEUTRAL) 155-852-130 MG tablet Take 1 tablet (250 mg total) by mouth 2 (two) times daily. 08/15/16  Yes Cammie Sickle, MD  potassium chloride SA (K-DUR,KLOR-CON) 20 MEQ tablet Take 20 mEq by mouth 2 (two) times daily.    Yes [provider]  simvastatin (ZOCOR) 80 MG tablet Take 80 mg by mouth at bedtime.   Yes [provider]    Allergies: Allergies  Allergen Reactions  . No Known Allergies     Social History:  reports that she quit smoking about 12 years ago. Her smoking use included Cigarettes. She smoked 1.00 pack per day. She has  never used smokeless tobacco. She reports that she does not drink alcohol or use drugs.   Family History: Family History  Problem Relation Age of Onset  . Atrial fibrillation Sister   . Hypertension Sister     Review of Systems: Review of Systems  Constitutional: Positive for fever. Negative for chills.  HENT: Negative for hearing loss.   Eyes: Negative for blurred vision.  Respiratory: Negative for cough.   Cardiovascular: Negative for chest pain.  Gastrointestinal: Negative for abdominal pain, nausea and vomiting.  Genitourinary: Negative for dysuria.  Musculoskeletal: Positive for myalgias (right arm pain).  Skin: Negative for rash.       Multiple wounds to right upper extremity, some stable, and some worsening.  Neurological: Positive for focal weakness (right arm). Negative for dizziness.  Psychiatric/Behavioral: Negative for depression.  All other systems reviewed and are negative.   Physical Exam BP (!) 107/48 (BP Location: Left Arm)   Pulse (!) 118   Temp 99.2 F (37.3 C) (Oral)   Resp 20   Ht 5\' 5"  (1.651 m)   Wt 131.2 kg (289 lb 4.8 oz)   SpO2 98%   BMI 48.14 kg/m  CONSTITUTIONAL: No acute distress HEENT:  Normocephalic, atraumatic, extraocular motion intact. NECK: Trachea is midline, and there is no jugular venous distension.  RESPIRATORY:  Lungs are clear, and breath sounds are equal bilaterally. Normal respiratory effort without pathologic use of accessory muscles. CARDIOVASCULAR: Heart is regular without murmurs, gallops, or rubs. GI: The abdomen is soft, nondistended, nontender.  MUSCULOSKELETAL:  Unable to move her right upper extremity, with no sensation to the right hand. SKIN: Multiple wounds from right distal neck / proximal shoulder to the hand. Shoulder and back of arm wounds with clean wound beds without necrosis.  Medial elbow wound with fibrinopurulent material, but unable to do a thorough exam because the patient has too much pain with any passive  abduction or movement of the arm.  The right had has two areas of necrotic ulcers, between the 1st and 2nd finger, as well as between 4th and 5th fingers.  The later one has exposed tendon over the lateral 4th finger. There are ischemic changes to the distal tips of the 4th and 5th fingers. NEUROLOGIC:  Cranial nerves are grossly intact. PSYCH:  Alert and oriented to person, place and time. Affect is normal.  Laboratory Analysis: Results for orders placed or performed during the hospital encounter of 12/22/16 (from the past 24 hour(s))  Basic metabolic panel     Status: Abnormal   Collection Time: 12/24/16  5:28 AM  Result Value Ref Range   Sodium 135 135 - 145 mmol/L   Potassium 3.6 3.5 - 5.1 mmol/L   Chloride 106 101 - 111 mmol/L   CO2 25 22 - 32 mmol/L   Glucose, Bld 159 (H) 65 - 99 mg/dL  BUN 12 6 - 20 mg/dL   Creatinine, Ser 1.01 (H) 0.44 - 1.00 mg/dL   Calcium 8.2 (L) 8.9 - 10.3 mg/dL   GFR calc non Af Amer >60 >60 mL/min   GFR calc Af Amer >60 >60 mL/min   Anion gap 4 (L) 5 - 15    Imaging: No results found.  Assessment and Plan: This is a 61 y.o. female who presents with septic picture with wound infections.  I have reviewed her laboratory findings and prior imaging studies.  She has progressive metastatic right breast cancer, with worsening disease on her last PET scan.  Her right upper extremity wounds were not able to be fully assessed due to significant pain that the patient has with any motion of the arm.  There is a wound in the medial elbow area that has some fibrinopurulent material that could be debrided, and her hand had necrotic wounds.  Significant concern due to poor blood flow to her hand.  She should have an orthopedic surgery consult to evaluate her hand and see if there is any possibility for salvage.  The patient may also benefit from a vascular surgery consult.  I am concerned that she would require an amputation, and at this point, the decision would be how  high of an amputation given her other wounds which have not been healing for a very long time.  Given her lymphedema, there is low chance for the wounds to heal without good blood flow.    If orthopedic surgery has any plans for surgical management, could also potentially debride her medial elbow wound in the operating room.  However, given the decreased blood flow, the debridement may only make the wound bigger without any benefit.  In the meantime, patient should continue with Dakins solution wet to dry dressing changes to all the wounds, as recommended by wound nurse.    Melvyn Neth, [place]

## 2016-12-24 NOTE — Consult Note (Signed)
Orthopaedic Consult Note  [name] is an 61 y.o. female with metastatic breast cancer.  She was first diagnosis in 2014.  She has know mets to skin and lymphnodes.  She has been receiving palliative radiation and chemotherapy.  She was admitted with a worsening right hand wound.   Pt reports wound has been present for approximately 3 wks. .  She has been followed by wound care and home health.  She has known the would has worsened but refrained from informing nursing in fear of the treatment.  She present to the ED with fever and chills.  She has since been started on broad spectrum antibiotics. She reports feeling much better since initiation of antibiotic.  She reports she has been unable to use her right arm from greater than three years.  She reports some deltoid function. She states that her hand in insensate but has stabing pain in her upper are and chest wall.  She has noticed an increase in the amount of drainage from her right hand over the past week.    Past Medical History:  Diagnosis Date  . Anemia   . Anginal pain ([place])   . Breast cancer ([place])    2014   . Breast cancer metastasized to bone ([place]) 11/21/2014  . Hypertension   . Last menstrual period (LMP) > 10 days ago 2007  . Lymphedema of right upper extremity   . Myocardial infarction ([place])   . Post-menopausal 2006  . Sleep apnea   . Wound drainage     Past Surgical History:  Procedure Laterality Date  . CORONARY ANGIOPLASTY WITH STENT PLACEMENT N/A   . HERNIA REPAIR     umbilical hernia repair  . PORTACATH PLACEMENT Right 02/03/2015   Procedure: INSERTION PORT-A-CATH;  Surgeon: [name], MD;  Location: [facility] ORS;  Service: General;  Laterality: Right;    Family History  Problem Relation Age of Onset  . Atrial fibrillation Sister   . Hypertension Sister     Social History:  reports that she quit smoking about 12 years ago. Her smoking use included Cigarettes. She smoked 1.00 pack per day. She has never  used smokeless tobacco. She reports that she does not drink alcohol or use drugs.  Allergies:  Allergies  Allergen Reactions  . No Known Allergies     Medications: I have reviewed the patient's current medications.  Results for orders placed or performed during the hospital encounter of 12/22/16 (from the past 48 hour(s))  CBC with Differential     Status: Abnormal   Collection Time: 12/22/16 10:15 PM  Result Value Ref Range   WBC 4.6 3.6 - 11.0 K/uL   RBC 2.88 (L) 3.80 - 5.20 MIL/uL   Hemoglobin 8.8 (L) 12.0 - 16.0 g/dL   HCT 26.4 (L) 35.0 - 47.0 %   MCV 91.7 80.0 - 100.0 fL   MCH 30.4 26.0 - 34.0 pg   MCHC 33.2 32.0 - 36.0 g/dL   RDW 19.7 (H) 11.5 - 14.5 %   Platelets 132 (L) 150 - 440 K/uL   Neutrophils Relative % 77 %   Neutro Abs 3.6 1.4 - 6.5 K/uL   Lymphocytes Relative 16 %   Lymphs Abs 0.7 (L) 1.0 - 3.6 K/uL   Monocytes Relative 5 %   Monocytes Absolute 0.2 0.2 - 0.9 K/uL   Eosinophils Relative 1 %   Eosinophils Absolute 0.0 0 - 0.7 K/uL   Basophils Relative 1 %   Basophils Absolute 0.1  0 - 0.1 K/uL  Comprehensive metabolic panel     Status: Abnormal   Collection Time: 12/22/16 10:15 PM  Result Value Ref Range   Sodium 135 135 - 145 mmol/L   Potassium 3.5 3.5 - 5.1 mmol/L   Chloride 102 101 - 111 mmol/L   CO2 22 22 - 32 mmol/L   Glucose, Bld 195 (H) 65 - 99 mg/dL   BUN 14 6 - 20 mg/dL   Creatinine, Ser 1.31 (H) 0.44 - 1.00 mg/dL   Calcium 9.1 8.9 - 10.3 mg/dL   Total Protein 8.0 6.5 - 8.1 g/dL   Albumin 2.8 (L) 3.5 - 5.0 g/dL   AST 31 15 - 41 U/L   ALT 25 14 - 54 U/L   Alkaline Phosphatase 59 38 - 126 U/L   Total Bilirubin 0.5 0.3 - 1.2 mg/dL   GFR calc non Af Amer 44 (L) >60 mL/min   GFR calc Af Amer 51 (L) >60 mL/min    Comment: (NOTE) The eGFR has been calculated using the CKD EPI equation. This calculation has not been validated in all clinical situations. eGFR's persistently <60 mL/min signify possible Chronic Kidney Disease.    Anion gap 11 5 -  15  TSH     Status: None   Collection Time: 12/22/16 10:15 PM  Result Value Ref Range   TSH 3.900 0.350 - 4.500 uIU/mL    Comment: Performed by a 3rd Generation assay with a functional sensitivity of <=0.01 uIU/mL.  Hemoglobin A1c     Status: Abnormal   Collection Time: 12/22/16 10:15 PM  Result Value Ref Range   Hgb A1c MFr Bld 6.0 (H) 4.8 - 5.6 %    Comment: (NOTE)         Pre-diabetes: 5.7 - 6.4         Diabetes: >6.4         Glycemic control for adults with diabetes: <7.0    Mean Plasma Glucose 126 mg/dL    Comment: (NOTE) Performed At: [facility] [place], [place] [id number] Lindon Romp MD [id number]   Culture, blood (routine x 2)     Status: None (Preliminary result)   Collection Time: 12/22/16 10:16 PM  Result Value Ref Range   Specimen Description BLOOD PORTA CATH    Special Requests      BOTTLES DRAWN AEROBIC AND ANAEROBIC Blood Culture adequate volume   Culture NO GROWTH 2 DAYS    Report Status PENDING   Lactic acid, plasma     Status: None   Collection Time: 12/22/16 10:16 PM  Result Value Ref Range   Lactic Acid, Venous 1.7 0.5 - 1.9 mmol/L  Culture, blood (routine x 2)     Status: None (Preliminary result)   Collection Time: 12/22/16 11:13 PM  Result Value Ref Range   Specimen Description BLOOD BLOOD LEFT HAND    Special Requests      BOTTLES DRAWN AEROBIC AND ANAEROBIC Blood Culture adequate volume   Culture NO GROWTH 2 DAYS    Report Status PENDING   Urinalysis, Complete w Microscopic     Status: Abnormal   Collection Time: 12/23/16 12:02 AM  Result Value Ref Range   Color, Urine YELLOW (A) YELLOW   APPearance HAZY (A) CLEAR   Specific Gravity, Urine 1.020 1.005 - 1.030   pH 5.0 5.0 - 8.0   Glucose, UA NEGATIVE NEGATIVE mg/dL   Hgb urine dipstick NEGATIVE NEGATIVE   Bilirubin Urine NEGATIVE NEGATIVE  Ketones, ur NEGATIVE NEGATIVE mg/dL   Protein, ur 30 (A) NEGATIVE mg/dL   Nitrite NEGATIVE NEGATIVE   Leukocytes, UA  NEGATIVE NEGATIVE   RBC / HPF 0-5 0 - 5 RBC/hpf   WBC, UA 0-5 0 - 5 WBC/hpf   Bacteria, UA RARE (A) NONE SEEN   Squamous Epithelial / LPF 0-5 (A) NONE SEEN   Mucous PRESENT    Hyaline Casts, UA PRESENT   Urine culture     Status: Abnormal   Collection Time: 12/23/16 12:02 AM  Result Value Ref Range   Specimen Description URINE, RANDOM    Special Requests NONE    Culture (A)     <10,000 COLONIES/mL INSIGNIFICANT GROWTH Performed at [facility] [address] [place],  [address]    Report Status 12/24/2016 FINAL   Basic metabolic panel     Status: Abnormal   Collection Time: 12/24/16  5:28 AM  Result Value Ref Range   Sodium 135 135 - 145 mmol/L   Potassium 3.6 3.5 - 5.1 mmol/L   Chloride 106 101 - 111 mmol/L   CO2 25 22 - 32 mmol/L   Glucose, Bld 159 (H) 65 - 99 mg/dL   BUN 12 6 - 20 mg/dL   Creatinine, Ser 1.01 (H) 0.44 - 1.00 mg/dL   Calcium 8.2 (L) 8.9 - 10.3 mg/dL   GFR calc non Af Amer >60 >60 mL/min   GFR calc Af Amer >60 >60 mL/min    Comment: (NOTE) The eGFR has been calculated using the CKD EPI equation. This calculation has not been validated in all clinical situations. eGFR's persistently <60 mL/min signify possible Chronic Kidney Disease.    Anion gap 4 (L) 5 - 15    Dg Hand Complete Right  Result Date: 12/22/2016 CLINICAL DATA:  Draining wounds to the right hand. EXAM: RIGHT HAND - COMPLETE 3+ VIEW COMPARISON:  None. FINDINGS: Negative for acute fracture. No dislocation. Poor bone density, decreasing sensitivity for intrinsic bony abnormality. There is irregularity at the ulnar aspect of the fourth proximal phalanx distally. This could represent osteolysis associated with infection. No soft tissue foreign body. IMPRESSION: Irregularity at the ulnar distal aspect of the fourth proximal phalanx, poorly seen due to bony demineralization. This could represent osteomyelitis in the appropriate clinical setting. No fracture or dislocation. Electronically  Signed   By: Andreas Newport M.D.   On: 12/22/2016 23:19   Exam:  RUE: Lymphadenopathy noted through the right upper extremity.  No palpable pulses on exam.  Pt does have brisk capillary refill in all five digits.  Pt in insensate to the hand in the median, radial and ulnar nerve distribution.  She has no active elbow, wrist or hand function.  The first web space has full thickness skin loss from the IP joint of the thumb to the MCP joint on the index finger. Muscle belly and tendon is seen with necrotic edges.  There is no purulent material noted. She has full thickness skin loss between the small in ring finger in the web space.  Skin loss extends the PIP joint of the ring and small finger.  No purulent material is note.  The surrounding skin edges are necrotic.  There is some cellulitis radiating from the wound.  The is and eschar of the dorsum of her thumb at the IP joint.  She has multiple full thickness wound over the proximal humerus extending down to muscle.  There is no drainage noted from the proximal arm wounds.  Pt refused to allow me to exam the medial aspect of her arm or her chest wall.   ROS Blood pressure (!) 107/48, pulse (!) 118, temperature 99.2 F (37.3 C), temperature source Oral, resp. rate 20, height 5' 5"  (1.651 m), weight 131.2 kg (289 lb 4.8 oz), SpO2 98 %. Physical Exam  Assessment/Plan: -Right full thickness hand wound with necrosis in the presents of sepsis -Multiple full thickness upper extremity wounds secondary to metastatic breast cancer. I agree with Dr. Hampton Abbot.  Pt will require amputation.  I am concerned about her wound healing potential with lymphadenopathy, questionable vascular exam and poor nutrition.  Level of amputation would best be determined by a hand center as I do not perform this procedure.   Continue IV antibiotics, daily wound care and protein supplementation. I discussed my finding with patient and she is in agreement.  If the decision is made to  transfer please let me know if I can be of any assistance.    Joaquin Courts 12/24/2016, 6:07 PM

## 2016-12-24 NOTE — Progress Notes (Signed)
Pharmacy Antibiotic Note  [name] is a 61 y.o. female admitted on 12/22/2016 with cellulitis.  Pharmacy has been consulted for vancomycin and Zosyn dosing.  Plan: Dose not given in ED as ordered. Vancomycin rescheduled for 0930. Will continue vancomycin 1g IV Q12hr for goal trough of 15-20. Will obtain trough prior to evening dose on 6/30.   6/30:  VT @ 21:00 = 20 mcg/mL Will decrease dose to Vancomycin 1 gm IV Q18H to start on 6/30 @ 23:00. Will draw next VT before 3rd new dose on 7/2 @ 10:30.   Continue Zosyn 3.375g IV Q8hr.   Height: 5\' 5"  (165.1 cm) Weight: 289 lb 4.8 oz (131.2 kg) IBW/kg (Calculated) : 57  Temp (24hrs), Avg:99.5 F (37.5 C), Min:99.2 F (37.3 C), Max:100 F (37.8 C)   Recent Labs Lab 12/22/16 2215 12/22/16 2216 12/24/16 0528 12/24/16 2056  WBC 4.6  --   --   --   CREATININE 1.31*  --  1.01*  --   LATICACIDVEN  --  1.7  --   --   VANCOTROUGH  --   --   --  20    Estimated Creatinine Clearance: 83.1 mL/min (A) (by C-G formula based on SCr of 1.01 mg/dL (H)).    Allergies  Allergen Reactions  . No Known Allergies     Antimicrobials this admission: Vancomycin 6/28 >> Zosyn 6/28 >>   Dose adjustments this admission:   Microbiology results: 6/28 BCx: no growth < 12 hours  6/28 UCx: pending   Thank you for allowing pharmacy to be a part of this patient's care.  Tahjai Schetter D 12/24/2016 9:44 PM

## 2016-12-24 NOTE — Progress Notes (Signed)
[facility] NAME: [name]    MR#:  [id number]  DATE OF BIRTH:  [birth year]  SUBJECTIVE:  CHIEF COMPLAINT:   Chief Complaint  Patient presents with  . Weakness   bp still little low    REVIEW OF SYSTEMS:  Review of Systems  Constitutional: Positive for malaise/fatigue. Negative for chills, fever and weight loss.  HENT: Negative for nosebleeds and sore throat.   Eyes: Negative for blurred vision.  Respiratory: Negative for cough, shortness of breath and wheezing.   Cardiovascular: Negative for chest pain, orthopnea, leg swelling and PND.  Gastrointestinal: Negative for abdominal pain, constipation, diarrhea, heartburn, nausea and vomiting.  Genitourinary: Negative for dysuria and urgency.  Musculoskeletal: Negative for back pain.  Skin: Positive for rash.  Neurological: Positive for weakness. Negative for dizziness, speech change, focal weakness and headaches.  Endo/Heme/Allergies: Does not bruise/bleed easily.  Psychiatric/Behavioral: Negative for depression.    DRUG ALLERGIES:   Allergies  Allergen Reactions  . No Known Allergies    VITALS:  Blood pressure (!) 99/59, pulse (!) 122, temperature 100 F (37.8 C), temperature source Oral, resp. rate 20, height 5\' 5"  (1.651 m), weight 289 lb 4.8 oz (131.2 kg), SpO2 98 %. PHYSICAL EXAMINATION:  Physical Exam  Constitutional: She is well-developed, well-nourished, and in no distress.  HENT:  Head: Normocephalic and atraumatic.  Eyes: Conjunctivae and EOM are normal. Pupils are equal, round, and reactive to light.  Neck: Normal range of motion.  Cardiovascular: Normal rate and regular rhythm.   Pulmonary/Chest: Effort normal and breath sounds normal.  Abdominal: Soft. Bowel sounds are normal.  Musculoskeletal: Normal range of motion.  Neurological: She is alert.  Skin: Lesion and rash noted.  considerable atrophy in the right hand, there are very deep wounds in  neck showing necrotic areas and have an odor to them clean the fourth and fifth digits, there are multiple wounds of the arm with what appear to be cellulitic changes around them. Some of these also appear to have necrotic changes. The hand is warm at this time.  Psychiatric: Affect and judgment normal.   LABORATORY PANEL:  Female CBC  Recent Labs Lab 12/22/16 2215  WBC 4.6  HGB 8.8*  HCT 26.4*  PLT 132*   ------------------------------------------------------------------------------------------------------------------ Chemistries   Recent Labs Lab 12/22/16 2215 12/24/16 0528  NA 135 135  K 3.5 3.6  CL 102 106  CO2 22 25  GLUCOSE 195* 159*  BUN 14 12  CREATININE 1.31* 1.01*  CALCIUM 9.1 8.2*  AST 31  --   ALT 25  --   ALKPHOS 59  --   BILITOT 0.5  --    RADIOLOGY:  No results found. ASSESSMENT AND PLAN:  This is a 61 year old female admitted for sepsis.  1. Sepsis: Present on admission likely due to wound infection. She was admitted for the same diagnosis approximately 7 weeks ago and had a poor prognosis at that time as her arm and chest wall. - continue broad spectrum antibiotic coverage. She is hemodynamically stable. I will ask surgery to see regarding their opinion  2. Breast cancer: Stage IV. Continue chemotherapy once dosing confirmed by oncology Center  3. Acute kidney injury: Hydrate with intravenous fluid.  Likely prerenal Avoid nephrotoxic agents.  4. Hypertension: diet controlled blood pressure actually running low we'll put her on some  midodrine   5. DVT prophylaxis: Eliquis  6. GI prophylaxis: None     All the records  are reviewed and case discussed with Care Management/Social Worker. Management plans discussed with the patient, family and they are in agreement.  CODE STATUS: Full Code  TOTAL TIME TAKING CARE OF THIS PATIENT: 35 minutes.   More than 50% of the time was spent in counseling/coordination of care: YES  POSSIBLE D/C IN 1-2  DAYS, DEPENDING ON CLINICAL CONDITION.   Dustin Flock M.D on 12/24/2016 at 1:39 PM  Between 7am to 6pm - Pager - [phone]  After 6pm go to [email or web address] - Technical brewer [facility]  Office  [phone]  CC: Primary care physician; Kirk Ruths, MD  Note: This dictation was prepared with Dragon dictation along with smaller phrase technology. Any transcriptional errors that result from this process are unintentional.

## 2016-12-24 NOTE — Plan of Care (Signed)
Problem: Activity: Goal: Risk for activity intolerance will decrease Outcome: Not Progressing Pt oob to bsc with 2 person assist; pt's HR as high as the 140's per off unit Telemetry monitor; HR down to 110 after 30 minutes of lying in the bed; will continue to monitor

## 2016-12-25 LAB — BASIC METABOLIC PANEL
Anion gap: 3 — ABNORMAL LOW (ref 5–15)
BUN: 15 mg/dL (ref 6–20)
CO2: 24 mmol/L (ref 22–32)
CREATININE: 0.85 mg/dL (ref 0.44–1.00)
Calcium: 8.1 mg/dL — ABNORMAL LOW (ref 8.9–10.3)
Chloride: 110 mmol/L (ref 101–111)
GFR calc Af Amer: >60 mL/min (ref >60)
GFR calc non Af Amer: >60 mL/min (ref >60)
GLUCOSE: 124 mg/dL — AB (ref 65–99)
Potassium: 3.7 mmol/L (ref 3.5–5.1)
SODIUM: 137 mmol/L (ref 135–145)

## 2016-12-25 LAB — CBC
HCT: 19.8 % — ABNORMAL LOW (ref 35.0–47.0)
Hemoglobin: 6.3 g/dL — ABNORMAL LOW (ref 12.0–16.0)
MCH: 29.8 pg (ref 26.0–34.0)
MCHC: 32 g/dL (ref 32.0–36.0)
MCV: 93.1 fL (ref 80.0–100.0)
Platelets: 101 10*3/uL — ABNORMAL LOW (ref 150–440)
RBC: 2.12 MIL/uL — ABNORMAL LOW (ref 3.80–5.20)
RDW: 20.6 % — ABNORMAL HIGH (ref 11.5–14.5)
WBC: 3.7 10*3/uL (ref 3.6–11.0)

## 2016-12-25 LAB — PREPARE RBC (CROSSMATCH)

## 2016-12-25 MED ORDER — VANCOMYCIN HCL IN DEXTROSE 1-5 GM/200ML-% IV SOLN
1000.0000 mg | Freq: Two times a day (BID) | INTRAVENOUS | Status: DC
  Administered 2016-12-25: 10:00:00 1000 mg via INTRAVENOUS
  Filled 2016-12-25 (×3): qty 200

## 2016-12-25 MED ORDER — VANCOMYCIN HCL IN DEXTROSE 1-5 GM/200ML-% IV SOLN: 1000.0000 mg | Freq: Two times a day (BID) | INTRAVENOUS | Status: AC

## 2016-12-25 MED ORDER — PIPERACILLIN-TAZOBACTAM 3.375 G IVPB: 3.3750 g | Freq: Three times a day (TID) | INTRAVENOUS | Status: AC

## 2016-12-25 MED ORDER — ALPRAZOLAM 0.5 MG PO TABS: 0.5000 mg | ORAL_TABLET | Freq: Three times a day (TID) | ORAL | 0 refills | Status: AC | PRN

## 2016-12-25 MED ORDER — SODIUM CHLORIDE 0.9 % IV SOLN
Freq: Once | INTRAVENOUS | Status: AC
  Administered 2016-12-25: 15:00:00 via INTRAVENOUS

## 2016-12-25 MED ORDER — ACETAMINOPHEN 325 MG PO TABS: 650.0000 mg | ORAL_TABLET | Freq: Four times a day (QID) | ORAL | Status: AC | PRN

## 2016-12-25 NOTE — Progress Notes (Signed)
TC report given to [place] with [employer] with pt transport accepted.

## 2016-12-25 NOTE — Progress Notes (Signed)
Pt's pain level increasing, but controlled with norco and morphine. Color more pale, increased generalized weakness, SOB on exertion with HGB down to 6.3. Blood transfusion education done with 1 unit transfusing. Pt and family agreeable to transfer to [facility] for further medical evaluation and management of wound right arm and hand. Appetite less today. Afebrile with vs's. TC report to Lucina Mellow at [facility] with pt assigned to [place]. Discharge packet being prepared with EMATLA to be completed. Family at bedside. Pt, family and [facility] RN agree to delay dsg change until arrival there since they will be doing wound assessment and care at that point. TC to [facility] at [phone] with transport arranged. They will call us at departure from [place] with estimated time of arrival in approximately 45 minutes from that point. Family and pt updated and in agreement.

## 2016-12-25 NOTE — Progress Notes (Addendum)
Staffed with Dr. Posey Pronto with no new orders since [facility] team has just arrived. [facility] team advised of vs change and they will communicate to [facility] care team for further medical evaluation. Pt requested pain med and xanax prior to transport- given as requested. Discharged for transfer to [place] per [facility] ambulance service.

## 2016-12-25 NOTE — Discharge Summary (Signed)
[place], 61 y.o., DOB 06-27-1956, MRN [id number]. 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 Patient also has other wounds are also in that arm. Patient presented with worsening wound was thought to have sepsis was started on broad-spectrum antibiotics. She was seen in consultation by general surgery as well as orthopedic surgery. Both the general surgeon and orthopedic surgeon felt that she needed a hand specialist and will likely require amputation. She is also noticed to have anemia that is worse and she will be receiving blood transfusion. If anemia continues to be a problem her eliquis needs to be discontinued for time being. I made contact at [facility] and they have accepted the patient in transfer to the general medical service.           Consults  orthopedic surgery, gernal surgery   Sificant Tests:  See full reports for all details     Nm Pet Image Restag (ps) Skull Base To Thigh  Result Date: 11/29/2016 CLINICAL DATA:  Subsequent treatment strategy for breast cancer. EXAM:  NUCLEAR MEDICINE PET SKULL BASE TO THIGH TECHNIQUE: 13.03 mCi F-18 FDG was injected intravenously. Full-ring PET imaging was performed from the skull base to thigh after the radiotracer. CT data was obtained and used for attenuation correction and anatomic localization. FASTING BLOOD GLUCOSE:  Value: 162 mg/dl COMPARISON:  PET-CT 06/26/2013. FINDINGS: NECK There are some soft tissue implants in the subcutaneous fat of the right posterior neck which are hypermetabolic. The largest of these (axial image 27 of series 3) measures 3.2 x 1.4 cm (SUVmax = 10.9). CHEST Large amount of amorphous soft tissue around the right shoulder joint, predominantly involving the deltoid musculature, diffusely hypermetabolic (SUVmax = 5.9). Mixed calcified and soft tissue attenuation mass in the right axilla measuring 3.2 x 7.0 cm (axial image 76 of series 3) is hypermetabolic (SUVmax = 5.9). Focal area of soft tissue thickening in the lateral aspect of the right breast (potentially at a lumpectomy scar site) on axial image 92 of series 3 demonstrates diffuse hypermetabolism (SUVmax = 6.5). No hypermetabolic mediastinal or hilar nodes. No suspicious pulmonary nodules on the CT scan. Heart size is normal. Small amount of pericardial fluid and/or thickening, unlikely to be of any hemodynamic significance at this time. There is aortic atherosclerosis, as well as atherosclerosis of the great vessels of the mediastinum and the coronary arteries, including calcified atherosclerotic plaque in the left anterior descending, left circumflex and right coronary arteries. Calcifications of the aortic valve and mitral annulus. Left internal jugular single-lumen porta cath with tip terminating in the mid superior vena cava. ABDOMEN/PELVIS Multiple areas of hypermetabolism are noted within the liver,  compatible with metastatic disease. The largest of these is centered in segment 4A (SUVmax = 7.9). These lesions correspond to very subtle areas of  hypoattenuation in the liver. Large partially calcified mass arising from the inferior aspect of the left adrenal gland, extending toward the left renal hilum where is intimately associated with multiple vessels. This mass which is favored to represent an adrenal metastasis measures 3.7 x 4.9 cm (axial image 142 of series 3) and is diffusely hypermetabolic (SUVmax = 35.7). No abnormal hypermetabolic activity within the pancreas, right adrenal gland, or spleen. Mildly enlarged hepatoduodenal ligament lymph node (axial image 142 of series 3) measuring 15 mm in short axis is hypermetabolic (SUVmax = 4.6). Enlarged left inguinal lymph node measuring 2.5 x 2.4 cm which is hypermetabolic (SUVmax = 01.7)BL image 236 of series 3. SKELETON There are multiple sites of skeletal hypermetabolism, most evident in the thoracic and lumbar spine, and in the pelvis, compatible with metastatic disease to the bones. One example of this is a 13 mm lucent lesion in the left ilium above the left acetabulum (axial image 221 of series 3) which is hypermetabolic (SUVmax = 6.2). IMPRESSION: 1. Widespread metastatic disease in the soft tissues, skeleton, liver, left adrenal gland and lymph nodes, as detailed above. Findings appear generally progressive compared to prior study 07/08/2016. 2. Aortic atherosclerosis, in addition to three-vessel coronary artery disease. 3. Additional incidental findings, as above. Electronically Signed   By: Vinnie Langton M.D.   On: 11/29/2016 13:30   Dg Hand Complete Right  Result Date: 12/22/2016 CLINICAL DATA:  Draining wounds to the right hand. EXAM: RIGHT HAND - COMPLETE 3+ VIEW COMPARISON:  None. FINDINGS: Negative for acute fracture. No dislocation. Poor bone density, decreasing sensitivity for intrinsic bony abnormality. There is irregularity at the ulnar aspect of the fourth proximal phalanx distally. This could represent osteolysis associated with infection. No soft tissue foreign body.  IMPRESSION: Irregularity at the ulnar distal aspect of the fourth proximal phalanx, poorly seen due to bony demineralization. This could represent osteomyelitis in the appropriate clinical setting. No fracture or dislocation. Electronically Signed   By: Andreas Newport M.D.   On: 12/22/2016 23:19       Today   Subjective:   [name]  patient has pain in the arm. But denies any chest pain or shortness of breath  Objective:   Blood pressure (!) 91/45, pulse (!) 114, temperature 98.3 F (36.8 C), temperature source Oral, resp. rate 20, height 5\' 5"  (1.651 m), weight 296 lb 4.8 oz (134.4 kg), SpO2 97 %.  .  Intake/Output Summary (Last 24 hours) at 12/25/16 1132 Last data filed at 12/25/16 0959  Gross per 24 hour  Intake           3399.5 ml  Output                0 ml  Net           3399.5 ml    Exam VITAL SIGNS: Blood pressure (!) 91/45, pulse (!) 114, temperature 98.3 F (36.8 C), temperature source Oral, resp. rate 20, height 5\' 5"  (1.651 m), weight 296 lb 4.8 oz (134.4 kg), SpO2 97 %.  GENERAL:  61 y.o.-year-old patient lying in the bed with no acute distress.  EYES: Pupils equal, round, reactive to light and accommodation. No scleral icterus. Extraocular muscles intact.  HEENT: Head atraumatic, normocephalic. Oropharynx and nasopharynx clear.  NECK:  Supple, no jugular venous distention. No thyroid enlargement, no tenderness.  LUNGS: Normal breath sounds bilaterally, no wheezing, rales,rhonchi or crepitation. No use of accessory muscles of respiration.  CARDIOVASCULAR: S1, S2 normal. No murmurs, rubs, or gallops.  ABDOMEN: Soft, nontender, nondistended. Bowel sounds present. No organomegaly or mass.  EXTREMITIES: No pedal edema, cyanosis, or clubbing.  NEUROLOGIC: Cranial nerves II through XII are intact. Muscle strength 5/5 in all extremities. Sensation intact. Gait not checked.  PSYCHIATRIC: The patient is alert and oriented x 3.  SKIN: Lymphedema of the right upper  extremity. Noted to have full-thickness skin loss in the first webspace also full-thickness skin loss between the small ring finger in the webspace. There is also necrosis Multiple other ones in the upper extremity  Data Review     CBC w Diff: Lab Results  Component Value Date   WBC 3.7 12/25/2016   HGB 6.3 (L) 12/25/2016   HGB 13.7 10/02/2014   HCT 19.8 (L) 12/25/2016   HCT 42.8 10/02/2014   PLT 101 (L) 12/25/2016   PLT 257 10/02/2014   LYMPHOPCT 16 12/22/2016   LYMPHOPCT 19.4 10/02/2014   MONOPCT 5 12/22/2016   MONOPCT 6.1 10/02/2014   EOSPCT 1 12/22/2016   EOSPCT 3.3 10/02/2014   BASOPCT 1 12/22/2016   BASOPCT 1.5 10/02/2014   CMP: Lab Results  Component Value Date   NA 137 12/25/2016   NA 142 08/01/2014   K 3.7 12/25/2016   K 3.5 08/01/2014   CL 110 12/25/2016   CL 103 08/01/2014   CO2 24 12/25/2016   CO2 27 08/01/2014   BUN 15 12/25/2016   BUN 13 08/01/2014   CREATININE 0.85 12/25/2016   CREATININE 0.71 10/02/2014   PROT 8.0 12/22/2016   PROT 7.6 10/02/2014   ALBUMIN 2.8 (L) 12/22/2016   ALBUMIN 3.7 10/02/2014   BILITOT 0.5 12/22/2016   BILITOT 0.5 10/02/2014   ALKPHOS 59 12/22/2016   ALKPHOS 41 10/02/2014   AST 31 12/22/2016   AST 21 10/02/2014   ALT 25 12/22/2016   ALT 23 10/02/2014  .  Micro Results Recent Results (from the past 240 hour(s))  Culture, blood (routine x 2)     Status: None (Preliminary result)   Collection Time: 12/22/16 10:16 PM  Result Value Ref Range Status   Specimen Description BLOOD PORTA CATH  Final   Special Requests   Final    BOTTLES DRAWN AEROBIC AND ANAEROBIC Blood Culture adequate volume   Culture NO GROWTH 3 DAYS  Final   Report Status PENDING  Incomplete  Culture, blood (routine x 2)     Status: None (Preliminary result)   Collection Time: 12/22/16 11:13 PM  Result Value Ref Range Status   Specimen Description BLOOD BLOOD LEFT HAND  Final   Special Requests   Final    BOTTLES DRAWN AEROBIC AND ANAEROBIC Blood  Culture adequate volume   Culture NO GROWTH 3 DAYS  Final   Report Status PENDING  Incomplete  Urine culture     Status: Abnormal   Collection Time: 12/23/16 12:02 AM  Result Value Ref Range Status   Specimen Description URINE, RANDOM  Final   Special Requests NONE  Final   Culture (A)  Final    <10,000 COLONIES/mL INSIGNIFICANT GROWTH Performed at [facility], [address], [place], [place] [address]    Report Status 12/24/2016 FINAL  Final        Code Status Orders        Start     Ordered   12/23/16 0845  Full  code  Continuous     12/23/16 0844    Code Status History    Date Active Date Inactive Code Status Order ID Comments User Context   10/29/2016  4:26 AM 11/03/2016  8:23 PM Full Code [id number]  Harrie Foreman, MD ED   10/29/2016  4:26 AM 10/29/2016  4:26 AM DNR [id number]  Harrie Foreman, MD ED   02/08/2016  5:36 PM 02/09/2016  5:49 PM Full Code [id number]  Hower, Aaron Mose, MD ED            Discharge Medications   Allergies as of 12/25/2016      Reactions   No Known Allergies       Medication List    TAKE these medications   acetaminophen 325 MG tablet Commonly known as:  TYLENOL Take 2 tablets (650 mg total) by mouth every 6 (six) hours as needed for mild pain (or Fever >/= 101).   ALPRAZolam 0.5 MG tablet Commonly known as:  XANAX Take 1 tablet (0.5 mg total) by mouth 3 (three) times daily as needed for anxiety.   [employer] CALCIUM 600+D 600-800 MG-UNIT Tabs Generic drug:  Calcium Carb-Cholecalciferol Take 1 tablet by mouth daily. At noon   ELIQUIS 5 MG Tabs tablet Generic drug:  apixaban TAKE 1 TABLET BY MOUTH TWICE DAILY   feeding supplement (ENSURE ENLIVE) Liqd Take 237 mLs by mouth 3 (three) times daily between meals.   fexofenadine 180 MG tablet Commonly known as:  ALLEGRA Take 180 mg by mouth daily.   HYDROcodone-acetaminophen 5-325 MG tablet Commonly known as:  NORCO Take 1 tablet by mouth every 6 (six) hours as needed for  moderate pain (every 6 to 8 hrs prn pain).   loperamide 2 MG capsule Commonly known as:  IMODIUM Take 2 mg by mouth as needed for diarrhea or loose stools.   omega-3 acid ethyl esters 1 g capsule Commonly known as:  LOVAZA Take 1 g by mouth at bedtime.   ondansetron 8 MG tablet Commonly known as:  ZOFRAN TAKE 1 TABLET (8MG ) BY MOUTH TWICE DAILY START THE DAY AFTER CHEMOTHERAPY FOR 2 DAYS THEN TAKE AS NEEDED FOR NAUSEA OR VOMITING   phosphorus 155-852-130 MG tablet Commonly known as:  PHOSPHA 250 NEUTRAL Take 1 tablet (250 mg total) by mouth 2 (two) times daily.   piperacillin-tazobactam 3.375 GM/50ML IVPB Commonly known as:  ZOSYN Inject 50 mLs (3.375 g total) into the vein every 8 (eight) hours.   potassium chloride SA 20 MEQ tablet Commonly known as:  K-DUR,KLOR-CON Take 20 mEq by mouth 2 (two) times daily.   simvastatin 80 MG tablet Commonly known as:  ZOCOR Take 80 mg by mouth at bedtime.   vancomycin 1-5 GM/200ML-% Soln Commonly known as:  VANCOCIN Inject 200 mLs (1,000 mg total) into the vein every 12 (twelve) hours.   VERZENIO PO Take 1 tablet by mouth 2 (two) times daily.          Total Time in preparing paper work, data evaluation and todays exam - 35 minutes  Dustin Flock M.D on 12/25/2016 at 11:32 AM  [facility]   Office  [phone]

## 2016-12-25 NOTE — Progress Notes (Signed)
PRBC's x 1 unit transfused. Noted elevated HR and respirations 24 easy/even; other vs's stable. Denies symptoms. Paged MD with current vs's. Awaiting transport.

## 2016-12-25 NOTE — Progress Notes (Signed)
1st unit PRBC's transfused. Pt reports tolerated well. HR 120/asymptomatic. Family at bedside. Awaiting transport.

## 2016-12-25 NOTE — Progress Notes (Signed)
Pharmacy Antibiotic Note  [name] is a 61 y.o. female admitted on 12/22/2016 with cellulitis.  Pharmacy has been consulted for vancomycin and Zosyn dosing.  Plan: Dose not given in ED as ordered. Vancomycin rescheduled for 0930. Will continue vancomycin 1g IV Q12hr for goal trough of 15-20. Will obtain trough prior to evening dose on 6/30.   6/30:  VT @ 21:00 = 20 mcg/mL Will decrease dose to Vancomycin 1 gm IV Q18H to start on 6/30 @ 23:00. Will draw next VT before 3rd new dose on 7/2 @ 10:30.    7/1: Scr improved. Will change back to Vancomycin 1 g IV q12 hours. Based on kinetics calculation patient would qualify for q8 hour dosing however previous level resulted @ 20 therefore will continue with current regimen.   Continue Zosyn 3.375g IV Q8hr.   Height: 5\' 5"  (165.1 cm) Weight: 296 lb 4.8 oz (134.4 kg) IBW/kg (Calculated) : 57  Temp (24hrs), Avg:99 F (37.2 C), Min:98.3 F (36.8 C), Max:99.4 F (37.4 C)   Recent Labs Lab 12/22/16 2215 12/22/16 2216 12/24/16 0528 12/24/16 2056 12/25/16 0442  WBC 4.6  --   --   --  3.7  CREATININE 1.31*  --  1.01*  --  0.85  LATICACIDVEN  --  1.7  --   --   --   VANCOTROUGH  --   --   --  20  --     Estimated Creatinine Clearance: 100.2 mL/min (by C-G formula based on SCr of 0.85 mg/dL).    Allergies  Allergen Reactions  . No Known Allergies     Antimicrobials this admission: Vancomycin 6/28 >> Zosyn 6/28 >>   Dose adjustments this admission:   Microbiology results: 6/28 BCx: no growth < 12 hours  6/28 UCx: pending   Thank you for allowing pharmacy to be a part of this patient's care.  Kallen Delatorre D 12/25/2016 8:54 AM

## 2016-12-25 NOTE — Care Management Note (Signed)
Case Management Note  Patient Details  Name: [name] MRN: [id number] Date of Birth: [birth year]  Subjective/Objective:        [name] is an open patient of [facility] for PT, OT, RN. This writer notified Melene Muller that she is being transferred to [facility].            Action/Plan:   Expected Discharge Date:  12/25/16               Expected Discharge Plan:     In-House Referral:     Discharge planning Services     Post Acute Care Choice:    Choice offered to:     DME Arranged:    DME Agency:     HH Arranged:    HH Agency:     Status of Service:     If discussed at [employer] [facility] [employer], dates discussed:    Additional Comments:  Danasha Melman A, RN 12/25/2016, 2:47 PM

## 2016-12-26 ENCOUNTER — Telehealth: Payer: Self-pay | Admitting: Internal Medicine

## 2016-12-26 LAB — BPAM RBC
BLOOD PRODUCT EXPIRATION DATE: 201807052359
ISSUE DATE / TIME: 201807011433
UNIT TYPE AND RH: 600

## 2016-12-26 LAB — TYPE AND SCREEN
ABO/RH(D): A POS
Antibody Screen: NEGATIVE
UNIT DIVISION: 0

## 2016-12-26 NOTE — Telephone Encounter (Signed)
Spoke to [facility] at [employer]- regarding overall prognosis [given the extent of the disease/refractory nature/multiple lines of therapy/ active infection issues- approximately 3 months];will be candidate for hospice especially in context of needed aggressive surgical intervention like amputation. Also discussed re: onc/palliative care consultation. I agree, the overall prognosis is very poor.

## 2016-12-27 LAB — CULTURE, BLOOD (ROUTINE X 2)
CULTURE: NO GROWTH
Culture: NO GROWTH
SPECIAL REQUESTS: ADEQUATE
Special Requests: ADEQUATE

## 2017-01-06 ENCOUNTER — Inpatient Hospital Stay: Payer: BLUE CROSS/BLUE SHIELD

## 2017-01-06 ENCOUNTER — Inpatient Hospital Stay: Payer: BLUE CROSS/BLUE SHIELD | Admitting: Internal Medicine

## 2017-01-06 NOTE — Progress Notes (Deleted)
[place]  [phone]  [phone]     [name] DOB: 06-Feb-1956  MR#: [id number]  [id number]#:[id number]  Patient Care Team: Kirk Ruths, MD as PCP - General (Internal Medicine)  CHIEF COMPLAINT:  No chief complaint on file.   [place] OFFICE PROGRESS NOTE  Patient Care Team: Kirk Ruths, MD as PCP - General (Internal Medicine)  Breast cancer metastasized to bone [facility])   Staging form: Breast, AJCC 7th Edition     Clinical stage from 06/27/2013: Stage IV (T4, N2, M1) - Signed by [name], NP on 12/25/2014    Oncology History   1. RIGHT Breast cancer [Neglected] T4 N2 M1 ER positive HER-2 negative, stage IV disease of the right breast with metastases to thoracic spine, right chest wall, adrenal gland. Jan 2015- Letrozole + [name] 2016- CEA-223]  2. July 2016- [facility] study-random Taxol; Progression- CEA ~200s/Imaging  3. April 2017- ERIBULIN; AUG 14th 2017- PROGRESSION right shouler girdle/ Left adernal mass  # Feb 17 2016- GEM x2 cycles [poor tol-fevers/ Increased LFTs]; OCT 2017- CT- stable/ improved left adrenal mass  # OCT 25th 2017- DOXIL q 4w; JAN 12th CT C/A/P- STABLE/mixed response; cont Doxil;   # MARCH 19th 2018- clinical progression; Pal RT to Right UE.  # April 2018- START Faslodex + Abemaciclib.   # LYMPHEDEMA of RIGHT UE /Breast ulcerated lesion; AUG 2017 - [facility] PE- Eliquis     Breast cancer metastasized to bone [facility]) (Resolved)   06/27/2013 Initial Diagnosis    Breast cancer metastasized to bone       Carcinoma of overlapping sites of right breast in female, estrogen receptor positive ([place])   INTERVAL HISTORY:  A pleasant 61 year old morbidly obese Caucasian female patient with above history of metastatic breast cancer- Status post multiple lines of therapy most recently started on Faslodex + AbemaciclibStarted April 2018.   Patient was recently admitted to the  hospital for Bleeding/infection of the right upper extremity shoulder malignant ulcer. Patient was treated with IV antibiotics. She was evaluated by surgery- recommended continued wound care; hyperbaric oxygen. Patient had acute renal failure with a creatinine up to 2.6; likely secondary to antibiotics. Was evaluated by nephrology. Patient had anemia needing 2 units of blood transfusion. Abemaciclib was held because of renal failure anemia.   Patient stated that she started taking Lasix for the last couple of days because she noted swelling in the legs.  Patient continues to take 1 hydrocodone for the right shoulder pain every 8 hours or so.  Denies any diarrhea. No fevers or chills. She feels much improved since discharge from the hospital. She has a few more days of Augmentin left.  REVIEW OF SYSTEMS:  A complete 10 point review of system is done which is negative except mentioned above/history of present illness.   PAST MEDICAL HISTORY :  Past Medical History:  Diagnosis Date  . Anemia   . Anginal pain ([place])   . Breast cancer ([place])    2014   . Breast cancer metastasized to bone ([place]) 11/21/2014  . Hypertension   . Last menstrual period (LMP) > 10 days ago 2007  . Lymphedema of right upper extremity   . Myocardial infarction ([place])   . Post-menopausal 2006  . Sleep apnea   . Wound drainage     PAST SURGICAL HISTORY :   Past Surgical History:  Procedure Laterality Date  . CORONARY ANGIOPLASTY WITH STENT PLACEMENT N/A   .  HERNIA REPAIR     umbilical hernia repair  . PORTACATH PLACEMENT Right 02/03/2015   Procedure: INSERTION PORT-A-CATH;  Surgeon: [name], MD;  Location: [facility] ORS;  Service: General;  Laterality: Right;    FAMILY HISTORY :   Family History  Problem Relation Age of Onset  . Atrial fibrillation Sister   . Hypertension Sister     SOCIAL HISTORY:   Social History  Substance Use Topics  . Smoking status: Former Smoker    Packs/day: 1.00    Types:  Cigarettes    Quit date: 10/25/2004  . Smokeless tobacco: Never Used     Comment: stopped smoking greater than 7 years ago  . Alcohol use No    ALLERGIES:  is allergic to no known allergies.  MEDICATIONS:  Current Outpatient Prescriptions  Medication Sig Dispense Refill  . Abemaciclib (VERZENIO PO) Take 1 tablet by mouth 2 (two) times daily.    Marland Kitchen acetaminophen (TYLENOL) 325 MG tablet Take 2 tablets (650 mg total) by mouth every 6 (six) hours as needed for mild pain (or Fever >/= 101).    Marland Kitchen ALPRAZolam (XANAX) 0.5 MG tablet Take 1 tablet (0.5 mg total) by mouth 3 (three) times daily as needed for anxiety.  0  . [facility] CALCIUM 600+D 600-800 MG-UNIT TABS Take 1 tablet by mouth daily. At noon  3  . ELIQUIS 5 MG TABS tablet TAKE 1 TABLET BY MOUTH TWICE DAILY 180 tablet PRN  . feeding supplement, ENSURE ENLIVE, (ENSURE ENLIVE) LIQD Take 237 mLs by mouth 3 (three) times daily between meals. 237 mL 12  . fexofenadine (ALLEGRA) 180 MG tablet Take 180 mg by mouth daily.     Marland Kitchen HYDROcodone-acetaminophen (NORCO) 5-325 MG tablet Take 1 tablet by mouth every 6 (six) hours as needed for moderate pain (every 6 to 8 hrs prn pain). 60 tablet 0  . loperamide (IMODIUM) 2 MG capsule Take 2 mg by mouth as needed for diarrhea or loose stools.    Marland Kitchen omega-3 acid ethyl esters (LOVAZA) 1 G capsule Take 1 g by mouth at bedtime.    . ondansetron (ZOFRAN) 8 MG tablet TAKE 1 TABLET (8MG) BY MOUTH TWICE DAILY START THE DAY AFTER CHEMOTHERAPY FOR 2 DAYS THEN TAKE AS NEEDED FOR NAUSEA OR VOMITING 30 tablet 0  . phosphorus (PHOSPHA 250 NEUTRAL) 155-852-130 MG tablet Take 1 tablet (250 mg total) by mouth 2 (two) times daily. 180 tablet 1  . piperacillin-tazobactam (ZOSYN) 3.375 GM/50ML IVPB Inject 50 mLs (3.375 g total) into the vein every 8 (eight) hours. 50 mL   . potassium chloride SA (K-DUR,KLOR-CON) 20 MEQ tablet Take 20 mEq by mouth 2 (two) times daily.     . simvastatin (ZOCOR) 80 MG tablet Take 80 mg by mouth at bedtime.      . vancomycin (VANCOCIN) 1-5 GM/200ML-% SOLN Inject 200 mLs (1,000 mg total) into the vein every 12 (twelve) hours. 4000 mL    No current facility-administered medications for this visit.    Facility-Administered Medications Ordered in Other Visits  Medication Dose Route Frequency Provider Last Rate Last Dose  . sodium chloride 0.9 % injection 10 mL  10 mL Intracatheter PRN Forest Gleason, MD   10 mL at 07/07/15 0911  . sodium chloride flush (NS) 0.9 % injection 10 mL  10 mL Intravenous PRN Choksi, Janak, MD      . sodium chloride flush (NS) 0.9 % injection 10 mL  10 mL Intravenous PRN Cammie Sickle, MD  10 mL at 01/20/16 1320    PHYSICAL EXAMINATION: ECOG PERFORMANCE STATUS: 1 - Symptomatic but completely ambulatory  There were no vitals taken for this visit.  There were no vitals filed for this visit.  GENERAL: Well-nourished well-developed; Alert, no distress and comfortable.  She is Accompanied by her husband. A wheelchair.  Morbidly obese. EYES: no pallor or icterus OROPHARYNX: no thrush or ulceration;  NECK: supple, no masses felt LYMPH:  no palpable lymphadenopathy in the cervical, axillary or inguinal regions LUNGS: clear to auscultation and  No wheeze or crackles HEART/CVS: regular rate & rhythm and no murmurs; chronic bilateral lower extremity edema. ABDOMEN:abdomen soft, non-tender and normal bowel sounds Musculoskeletal:no cyanosis of digits and no clubbing; right upper extremity lymphedema noted; Atrophy of the right arm and/hand PSYCH: alert & oriented x 3 with fluent speech NEURO: no focal motor/sensory deficits SKIN:   Right breast ulcerated/ scabbing lesion noted; no drainage. Right anterior [4cm] shoulder; shoulder lesions [ 4x4 cm in size] noted. right shoulder posterior ulcerated mass; bandaged. LABORATORY DATA:  I have reviewed the data as listed    Component Value Date/Time   NA 137 12/25/2016 0442   NA 142 08/01/2014 1435   K 3.7 12/25/2016 0442    K 3.5 08/01/2014 1435   CL 110 12/25/2016 0442   CL 103 08/01/2014 1435   CO2 24 12/25/2016 0442   CO2 27 08/01/2014 1435   GLUCOSE 124 (H) 12/25/2016 0442   GLUCOSE 148 (H) 08/01/2014 1435   BUN 15 12/25/2016 0442   BUN 13 08/01/2014 1435   CREATININE 0.85 12/25/2016 0442   CREATININE 0.71 10/02/2014 0930   CALCIUM 8.1 (L) 12/25/2016 0442   CALCIUM 8.6 (L) 10/02/2014 0930   PROT 8.0 12/22/2016 2215   PROT 7.6 10/02/2014 0930   ALBUMIN 2.8 (L) 12/22/2016 2215   ALBUMIN 3.7 10/02/2014 0930   AST 31 12/22/2016 2215   AST 21 10/02/2014 0930   ALT 25 12/22/2016 2215   ALT 23 10/02/2014 0930   ALKPHOS 59 12/22/2016 2215   ALKPHOS 41 10/02/2014 0930   BILITOT 0.5 12/22/2016 2215   BILITOT 0.5 10/02/2014 0930   GFRNONAA >60 12/25/2016 0442   GFRNONAA >60 10/02/2014 0930   GFRAA >60 12/25/2016 0442   GFRAA >60 10/02/2014 0930    No results found for: SPEP, UPEP  Lab Results  Component Value Date   WBC 3.7 12/25/2016   NEUTROABS 3.6 12/22/2016   HGB 6.3 (L) 12/25/2016   HCT 19.8 (L) 12/25/2016   MCV 93.1 12/25/2016   PLT 101 (L) 12/25/2016      Chemistry      Component Value Date/Time   NA 137 12/25/2016 0442   NA 142 08/01/2014 1435   K 3.7 12/25/2016 0442   K 3.5 08/01/2014 1435   CL 110 12/25/2016 0442   CL 103 08/01/2014 1435   CO2 24 12/25/2016 0442   CO2 27 08/01/2014 1435   BUN 15 12/25/2016 0442   BUN 13 08/01/2014 1435   CREATININE 0.85 12/25/2016 0442   CREATININE 0.71 10/02/2014 0930      Component Value Date/Time   CALCIUM 8.1 (L) 12/25/2016 0442   CALCIUM 8.6 (L) 10/02/2014 0930   ALKPHOS 59 12/22/2016 2215   ALKPHOS 41 10/02/2014 0930   AST 31 12/22/2016 2215   AST 21 10/02/2014 0930   ALT 25 12/22/2016 2215   ALT 23 10/02/2014 0930   BILITOT 0.5 12/22/2016 2215   BILITOT 0.5 10/02/2014 0930  IMPRESSION: 1. In general, the metastatic lesions shown on the prior exam are stable with the exception of the left adrenal metastatic  lesion which is reduced in size. 2. 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 3. Extensive atherosclerosis and mild cardiomegaly. 4. There is some vague indistinctness of the anterior cortical margin of the left iliac bone. There could be some tumor eroding this bone all, along the left iliacus muscle, but I am not certain. There is enough artifact that this is questionable. There is also questionable lesions in the left upper sacrum, along with a small amount of presacral edema.  RADIOGRAPHIC STUDIES: I have personally reviewed the radiological images as listed and agreed with the findings in the report. No results found.    ASSESSMENT & PLAN:  No problem-specific Assessment & Plan notes found for this encounter.  No orders of the defined types were placed in this encounter.  All questions were answered. The patient knows to call the clinic with any problems, questions or concerns.      Cammie Sickle, MD 01/06/2017 8:10 AMp  Review of Systems  All other systems reviewed and are negative.          01/06/2017

## 2017-01-06 NOTE — Assessment & Plan Note (Deleted)
Metastatic breast cancer-ER/PR positive; Her 2 Neu Negative- status post multiple lines of therapy most recently on Faslodex + Abemaclib [started Mid April 2018]. Currently Abemaciclib on HOLD sec to recent hospitalization [acute renal failure; anemia]. Patient due for Faslodex today.  #  Acute renal failure- [based and creatinine 0.8 -.9] creat 2.7; hold lasix off; check mag. Recommend follow up with Dr.Singh.   #  hypokalemia- 3.1; continue potassium once a day.    # Pain control- right chest wall pain shoulder pain secondary to malignancy. Continue hydrocodone one every 8 hours as needed. New prescription given.  # Right shoulder/chest wall infection- finish off 7 more days of augumentin.   # PE- Moderate- based on CT imaging incidental. Patient is asymptomatic. Patient is on Eliquis.   # follow up in appx 10-14 days; weekly labs; PET scan prior.   Addendum: Patient has acute renal failure; contrast CT would be prohibitive because of the renal failure. Noncontrast CT would be suboptimal for the evaluation of her metastatic breast cancer. Hence would recommend a PET scan for further evaluation of the metastatic disease.

## 2017-01-09 ENCOUNTER — Telehealth: Payer: Self-pay | Admitting: *Deleted

## 2017-01-09 NOTE — Telephone Encounter (Signed)
Calling to confirm pt is no longer on VERZENIO.  pls return phone call.

## 2017-01-10 NOTE — Telephone Encounter (Signed)
RN returned phone call to [facility]. Confirmed with [facility] that pt is no longer on therapy. She is currently under hospice services. Therapy d/c

## 2017-03-13 ENCOUNTER — Telehealth: Payer: Self-pay | Admitting: *Deleted

## 2017-03-13 NOTE — Telephone Encounter (Signed)
T/C made to [facility] for purpose of research study survival follow up. Patient participated in the [facility] [id number] I research study for metastatic breast cancer. At this time, she no longer being seen in the clinic, but is being followed by Hospice. Patient states she is doing OK, but is weak and cannot stand up for long periods of time now. Reports she has lost the use of her right arm due to the lymphedema. She also states she has  A nurse's aide who comes three times a week and helps with her bath, and a nurse from Hospice that visits weekly, but that her husband is at home with her all the time now because she cannot be alone. Ms. Leece was instructed to call us for any needs and she agreed. Yolande Jolly, BSN, MHA, OCN 03/13/2017 1:29 PM

## 2017-04-27 IMAGING — NM NM CARDIA MUGA REST
4 series · 24 of 24 positions shown · non-contrast
Comparison: None.

CLINICAL DATA: Breast cancer. Evaluate cardiac function in relation
to chemotherapy.

EXAM:
NUCLEAR MEDICINE CARDIAC BLOOD POOL IMAGING (MUGA)
TECHNIQUE: Cardiac multi-gated acquisition was performed at rest following
intravenous injection of Bc-PPm labeled red blood cells.
RADIOPHARMACEUTICALS:  Twenty-one mCi Bc-PPm MDP in-vitro labeled
red blood cells IV

[Series 1000: lao 45-gated · 3.30mm/px · 6 of 24 frames shown]
[frame 3/24]
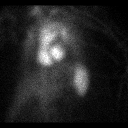
[frame 7/24]
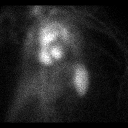
[frame 11/24]
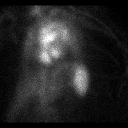
[frame 15/24]
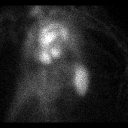
[frame 19/24]
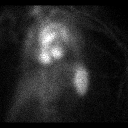
[frame 23/24]
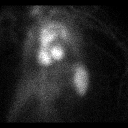

[Series 1000: ant-gated · 3.30mm/px · 6 of 24 frames shown]
[frame 3/24]
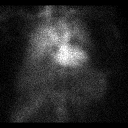
[frame 7/24]
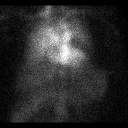
[frame 11/24]
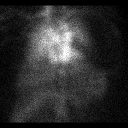
[frame 15/24]
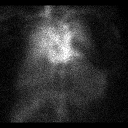
[frame 19/24]
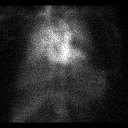
[frame 23/24]
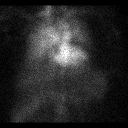

[Series 1000: lao 45-gated (results) · 3.30mm/px · 6 of 24 frames shown]
[frame 3/24]
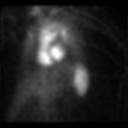
[frame 7/24]
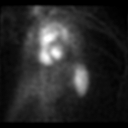
[frame 11/24]
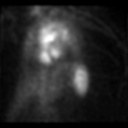
[frame 15/24]
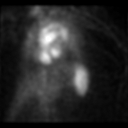
[frame 19/24]
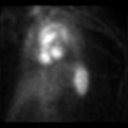
[frame 23/24]
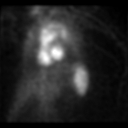

[Series 1000: lao 70-gated · 3.30mm/px · 6 of 24 frames shown]
[frame 3/24]
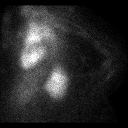
[frame 7/24]
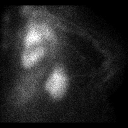
[frame 11/24]
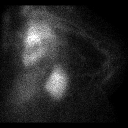
[frame 15/24]
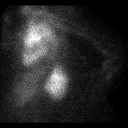
[frame 19/24]
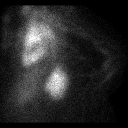
[frame 23/24]
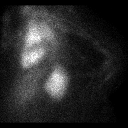

[24 of 24 positions shown; findings below may reference images not displayed]

FINDINGS: No  focal wall motion abnormality of the left ventricle.

Calculated left ventricular ejection fraction equals 60%
IMPRESSION: Left ventricular ejection fraction equals 60 %.

## 2017-04-27 DEATH — deceased

## 2020-02-28 ENCOUNTER — Other Ambulatory Visit: Payer: Self-pay | Admitting: Physician Assistant

## 2020-02-28 DIAGNOSIS — R109 Unspecified abdominal pain: Secondary | ICD-10-CM

## 2020-02-28 DIAGNOSIS — R1012 Left upper quadrant pain: Secondary | ICD-10-CM

## 2023-06-26 ENCOUNTER — Encounter: Payer: Self-pay | Admitting: Internal Medicine
